# Patient Record
Sex: Female | Born: 1935 | Race: White | Hispanic: No | Smoking: Never smoker
Health system: Southern US, Community
[De-identification: ages and names within clinical notes are randomized; demographics above are authoritative.]

## PROBLEM LIST (undated history)

## (undated) DIAGNOSIS — M79672 Pain in left foot: Secondary | ICD-10-CM

## (undated) DIAGNOSIS — E7800 Pure hypercholesterolemia, unspecified: Secondary | ICD-10-CM

## (undated) DIAGNOSIS — N183 Chronic kidney disease, stage 3 (moderate): Secondary | ICD-10-CM

## (undated) DIAGNOSIS — I459 Conduction disorder, unspecified: Secondary | ICD-10-CM

## (undated) DIAGNOSIS — I82451 Acute embolism and thrombosis of right peroneal vein: Secondary | ICD-10-CM

## (undated) DIAGNOSIS — Z96651 Presence of right artificial knee joint: Secondary | ICD-10-CM

## (undated) DIAGNOSIS — I442 Atrioventricular block, complete: Secondary | ICD-10-CM

## (undated) DIAGNOSIS — Z972 Presence of dental prosthetic device (complete) (partial): Secondary | ICD-10-CM

## (undated) DIAGNOSIS — J479 Bronchiectasis, uncomplicated: Secondary | ICD-10-CM

## (undated) DIAGNOSIS — R51 Headache: Secondary | ICD-10-CM

## (undated) DIAGNOSIS — R519 Headache, unspecified: Secondary | ICD-10-CM

## (undated) DIAGNOSIS — R0609 Other forms of dyspnea: Secondary | ICD-10-CM

## (undated) DIAGNOSIS — M5416 Radiculopathy, lumbar region: Secondary | ICD-10-CM

## (undated) DIAGNOSIS — M898X5 Other specified disorders of bone, thigh: Secondary | ICD-10-CM

## (undated) DIAGNOSIS — G8929 Other chronic pain: Secondary | ICD-10-CM

## (undated) DIAGNOSIS — E559 Vitamin D deficiency, unspecified: Secondary | ICD-10-CM

## (undated) DIAGNOSIS — N200 Calculus of kidney: Secondary | ICD-10-CM

## (undated) DIAGNOSIS — K5790 Diverticulosis of intestine, part unspecified, without perforation or abscess without bleeding: Secondary | ICD-10-CM

## (undated) DIAGNOSIS — J8410 Pulmonary fibrosis, unspecified: Secondary | ICD-10-CM

## (undated) DIAGNOSIS — K219 Gastro-esophageal reflux disease without esophagitis: Secondary | ICD-10-CM

## (undated) DIAGNOSIS — M79605 Pain in left leg: Secondary | ICD-10-CM

## (undated) DIAGNOSIS — I10 Essential (primary) hypertension: Secondary | ICD-10-CM

## (undated) DIAGNOSIS — N179 Acute kidney failure, unspecified: Secondary | ICD-10-CM

## (undated) DIAGNOSIS — S32040A Wedge compression fracture of fourth lumbar vertebra, initial encounter for closed fracture: Secondary | ICD-10-CM

## (undated) DIAGNOSIS — R5381 Other malaise: Secondary | ICD-10-CM

## (undated) DIAGNOSIS — I951 Orthostatic hypotension: Secondary | ICD-10-CM

## (undated) DIAGNOSIS — I63311 Cerebral infarction due to thrombosis of right middle cerebral artery: Secondary | ICD-10-CM

## (undated) DIAGNOSIS — K589 Irritable bowel syndrome without diarrhea: Secondary | ICD-10-CM

## (undated) DIAGNOSIS — R112 Nausea with vomiting, unspecified: Secondary | ICD-10-CM

## (undated) DIAGNOSIS — I219 Acute myocardial infarction, unspecified: Secondary | ICD-10-CM

## (undated) DIAGNOSIS — S32592A Other specified fracture of left pubis, initial encounter for closed fracture: Secondary | ICD-10-CM

## (undated) DIAGNOSIS — R531 Weakness: Secondary | ICD-10-CM

## (undated) DIAGNOSIS — I69354 Hemiplegia and hemiparesis following cerebral infarction affecting left non-dominant side: Secondary | ICD-10-CM

## (undated) DIAGNOSIS — M1711 Unilateral primary osteoarthritis, right knee: Secondary | ICD-10-CM

## (undated) DIAGNOSIS — M79609 Pain in unspecified limb: Secondary | ICD-10-CM

## (undated) DIAGNOSIS — J449 Chronic obstructive pulmonary disease, unspecified: Secondary | ICD-10-CM

## (undated) DIAGNOSIS — I2510 Atherosclerotic heart disease of native coronary artery without angina pectoris: Secondary | ICD-10-CM

## (undated) DIAGNOSIS — R600 Localized edema: Secondary | ICD-10-CM

## (undated) DIAGNOSIS — R5383 Other fatigue: Secondary | ICD-10-CM

## (undated) DIAGNOSIS — R001 Bradycardia, unspecified: Secondary | ICD-10-CM

## (undated) DIAGNOSIS — I4891 Unspecified atrial fibrillation: Secondary | ICD-10-CM

## (undated) DIAGNOSIS — K635 Polyp of colon: Secondary | ICD-10-CM

## (undated) DIAGNOSIS — Z950 Presence of cardiac pacemaker: Secondary | ICD-10-CM

## (undated) DIAGNOSIS — Z9889 Other specified postprocedural states: Secondary | ICD-10-CM

## (undated) DIAGNOSIS — M810 Age-related osteoporosis without current pathological fracture: Secondary | ICD-10-CM

## (undated) DIAGNOSIS — G971 Other reaction to spinal and lumbar puncture: Secondary | ICD-10-CM

## (undated) DIAGNOSIS — R000 Tachycardia, unspecified: Secondary | ICD-10-CM

## (undated) DIAGNOSIS — R079 Chest pain, unspecified: Secondary | ICD-10-CM

## (undated) DIAGNOSIS — M2042 Other hammer toe(s) (acquired), left foot: Secondary | ICD-10-CM

## (undated) DIAGNOSIS — J189 Pneumonia, unspecified organism: Secondary | ICD-10-CM

## (undated) DIAGNOSIS — I83893 Varicose veins of bilateral lower extremities with other complications: Secondary | ICD-10-CM

## (undated) DIAGNOSIS — I471 Supraventricular tachycardia: Secondary | ICD-10-CM

## (undated) DIAGNOSIS — I82409 Acute embolism and thrombosis of unspecified deep veins of unspecified lower extremity: Secondary | ICD-10-CM

## (undated) DIAGNOSIS — R002 Palpitations: Secondary | ICD-10-CM

## (undated) DIAGNOSIS — M25562 Pain in left knee: Secondary | ICD-10-CM

## (undated) DIAGNOSIS — A310 Pulmonary mycobacterial infection: Secondary | ICD-10-CM

## (undated) DIAGNOSIS — I472 Ventricular tachycardia: Secondary | ICD-10-CM

## (undated) DIAGNOSIS — I639 Cerebral infarction, unspecified: Secondary | ICD-10-CM

## (undated) DIAGNOSIS — M1990 Unspecified osteoarthritis, unspecified site: Secondary | ICD-10-CM

## (undated) DIAGNOSIS — J431 Panlobular emphysema: Secondary | ICD-10-CM

## (undated) DIAGNOSIS — Z86711 Personal history of pulmonary embolism: Secondary | ICD-10-CM

## (undated) HISTORY — DX: Atrioventricular block, complete: I44.2

## (undated) HISTORY — PX: MULTIPLE TOOTH EXTRACTIONS: SHX2053

## (undated) HISTORY — DX: Ventricular tachycardia: I47.2

## (undated) HISTORY — DX: Unspecified atrial fibrillation: I48.91

## (undated) HISTORY — DX: Other specified fracture of left pubis, initial encounter for closed fracture: S32.592A

## (undated) HISTORY — PX: CHOLECYSTECTOMY: SHX55

## (undated) HISTORY — DX: Other specified disorders of bone, thigh: M89.8X5

## (undated) HISTORY — DX: Vitamin D deficiency, unspecified: E55.9

## (undated) HISTORY — PX: BLADDER SUSPENSION: SHX72

## (undated) HISTORY — DX: Acute embolism and thrombosis of unspecified deep veins of unspecified lower extremity: I82.409

## (undated) HISTORY — DX: Other forms of dyspnea: R06.09

## (undated) HISTORY — DX: Other hammer toe(s) (acquired), left foot: M20.42

## (undated) HISTORY — DX: Other specified postprocedural states: Z98.890

## (undated) HISTORY — DX: Wedge compression fracture of fourth lumbar vertebra, initial encounter for closed fracture: S32.040A

## (undated) HISTORY — DX: Pulmonary fibrosis, unspecified: J84.10

## (undated) HISTORY — DX: Unilateral primary osteoarthritis, right knee: M17.11

## (undated) HISTORY — DX: Radiculopathy, lumbar region: M54.16

## (undated) HISTORY — DX: Pain in left knee: M25.562

## (undated) HISTORY — DX: Personal history of pulmonary embolism: Z86.711

## (undated) HISTORY — DX: Tachycardia, unspecified: R00.0

## (undated) HISTORY — DX: Other malaise: R53.81

## (undated) HISTORY — DX: Calculus of kidney: N20.0

## (undated) HISTORY — DX: Weakness: R53.1

## (undated) HISTORY — DX: Diverticulosis of intestine, part unspecified, without perforation or abscess without bleeding: K57.90

## (undated) HISTORY — DX: Pulmonary mycobacterial infection: A31.0

## (undated) HISTORY — DX: Essential (primary) hypertension: I10

## (undated) HISTORY — PX: TOTAL ABDOMINAL HYSTERECTOMY: SHX209

## (undated) HISTORY — PX: COLON SURGERY: SHX602

## (undated) HISTORY — DX: Pain in unspecified limb: M79.609

## (undated) HISTORY — DX: Other chronic pain: G89.29

## (undated) HISTORY — DX: Pain in left foot: M79.672

## (undated) HISTORY — DX: Cerebral infarction due to thrombosis of right middle cerebral artery: I63.311

## (undated) HISTORY — DX: Other fatigue: R53.83

## (undated) HISTORY — PX: APPENDECTOMY: SHX54

## (undated) HISTORY — DX: Age-related osteoporosis without current pathological fracture: M81.0

## (undated) HISTORY — DX: Acute embolism and thrombosis of right peroneal vein: I82.451

## (undated) HISTORY — DX: Presence of cardiac pacemaker: Z95.0

## (undated) HISTORY — DX: Bradycardia, unspecified: R00.1

## (undated) HISTORY — DX: Chronic kidney disease, stage 3 (moderate): N18.3

## (undated) HISTORY — DX: Supraventricular tachycardia: I47.1

## (undated) HISTORY — DX: Polyp of colon: K63.5

## (undated) HISTORY — DX: Pure hypercholesterolemia, unspecified: E78.00

## (undated) HISTORY — DX: Atherosclerotic heart disease of native coronary artery without angina pectoris: I25.10

## (undated) HISTORY — DX: Chest pain, unspecified: R07.9

## (undated) HISTORY — DX: Conduction disorder, unspecified: I45.9

## (undated) HISTORY — DX: Bronchiectasis, uncomplicated: J47.9

## (undated) HISTORY — DX: Orthostatic hypotension: I95.1

## (undated) HISTORY — PX: COLONOSCOPY W/ BIOPSIES AND POLYPECTOMY: SHX1376

## (undated) HISTORY — DX: Panlobular emphysema: J43.1

## (undated) HISTORY — DX: Palpitations: R00.2

## (undated) HISTORY — DX: Gastro-esophageal reflux disease without esophagitis: K21.9

## (undated) HISTORY — DX: Hemiplegia and hemiparesis following cerebral infarction affecting left non-dominant side: I69.354

## (undated) HISTORY — DX: Localized edema: R60.0

## (undated) HISTORY — DX: Pain in left leg: M79.605

## (undated) HISTORY — DX: Presence of right artificial knee joint: Z96.651

## (undated) HISTORY — DX: Acute myocardial infarction, unspecified: I21.9

## (undated) HISTORY — PX: TONSILLECTOMY: SUR1361

## (undated) HISTORY — DX: Varicose veins of bilateral lower extremities with other complications: I83.893

## (undated) HISTORY — DX: Irritable bowel syndrome, unspecified: K58.9

## (undated) HISTORY — DX: Acute kidney failure, unspecified: N17.9

---

## 1973-11-09 DIAGNOSIS — I219 Acute myocardial infarction, unspecified: Secondary | ICD-10-CM | POA: Insufficient documentation

## 1973-11-09 HISTORY — DX: Acute myocardial infarction, unspecified: I21.9

## 2004-04-04 ENCOUNTER — Inpatient Hospital Stay (HOSPITAL_COMMUNITY): Admission: RE | Admit: 2004-04-04 | Discharge: 2004-04-10 | Payer: Self-pay | Admitting: General Surgery

## 2004-04-04 ENCOUNTER — Encounter (INDEPENDENT_AMBULATORY_CARE_PROVIDER_SITE_OTHER): Payer: Self-pay | Admitting: Specialist

## 2004-04-30 ENCOUNTER — Encounter: Admission: RE | Admit: 2004-04-30 | Discharge: 2004-04-30 | Payer: Self-pay | Admitting: General Surgery

## 2005-04-02 ENCOUNTER — Ambulatory Visit: Payer: Self-pay | Admitting: Family Medicine

## 2005-05-05 ENCOUNTER — Ambulatory Visit: Payer: Self-pay | Admitting: Family Medicine

## 2005-06-25 ENCOUNTER — Ambulatory Visit: Payer: Self-pay | Admitting: Family Medicine

## 2005-07-08 ENCOUNTER — Ambulatory Visit: Payer: Self-pay | Admitting: Family Medicine

## 2005-07-21 ENCOUNTER — Ambulatory Visit: Payer: Self-pay | Admitting: Family Medicine

## 2005-08-06 ENCOUNTER — Ambulatory Visit: Payer: Self-pay | Admitting: Family Medicine

## 2005-09-04 ENCOUNTER — Ambulatory Visit: Payer: Self-pay | Admitting: Family Medicine

## 2011-03-27 NOTE — Op Note (Signed)
NAME:  LENOIR, FACCHINI                          ACCOUNT NO.:  [id number]   MEDICAL RECORD NO.:  [id number]                   PATIENT TYPE:  INP   LOCATION:  NA                                   FACILITY:  [facility]   PHYSICIAN:  Timothy E. Earlene Plater, M.D.              DATE OF BIRTH:  [birth year]   DATE OF PROCEDURE:  04/04/2004  DATE OF DISCHARGE:                                 OPERATIVE REPORT   PREOPERATIVE DIAGNOSIS:  Chronic recurrent diverticulitis.   POSTOPERATIVE DIAGNOSIS:  Chronic recurrent diverticulitis.   PROCEDURE:  Sigmoid colectomy.  Take-down of splenic flexure.  Colorectal  anastomosis.   SURGEON:  Timothy E. Earlene Plater, M.D.   ASSISTANT:  Sharlet Salina T. Hoxworth, M.D.   ANESTHESIA:  CRNA, supervised MD.   Ms. Remley is 39 and in good health for her age.  Has had recurrent bouts of  diverticulitis over the past five years, each one worsening in intensity.  After a thorough cardiac and gastrointestinal evaluation, she elected to  proceed with a colon resection at this time.  Her colonoscopy was otherwise  unremarkable except for the chronic diverticulosis.  The CT scan showed  minimal inflammatory changes at this time.  Cardiac workup was negative.   She is identified, and the permit signed.  IV started.  IV fluids  resuscitation began.  IV antibiotics given.  She was taken to the operating  room and placed supine.  General endotracheal anesthesia is administered.  PAS hose, Foley catheter, and a nasogastric tube placed.  The abdomen was  prepped and draped in the usual fashion.  A long midline incision was used  to enter the abdominal cavity just above the umbilicus to the lower midline.  The peritoneum was entered.  There were minimal adhesions to the anterior  abdominal wall.  These were taken down bluntly and sharply in the free  abdominal cavity was carefully explored.  The gallbladder was absent.  Ovaries and uterus were absent.  Most remarkable was the sigmoid colon,  which was redundant, doubling back on itself and distally adherent to the  left pelvic side wall.  After the exploration was complete, the small bowel  was packed into the right upper quadrant.  Appropriate retractors applied.  The white line of Toldt was carefully dissected from the splenic flexure  downward along the descending colon.  The sigmoid colon was approached  carefully.  The actual colon was quite thickened and firm and could be  sharply and bluntly dissected from the lateral pelvic wall and peritoneum.  A left ureter was carefully identified.  It was kept lateral to the  dissection.  This was accomplished until the entire sigmoid was freed from  the pelvic side wall.  We were then able to visualize the upper rectum, and  it appeared normal.  We elected to continue dissection of the splenic  flexure in order to allow some freedom with the  descending colon, so we  continued to divide the peritoneal reflection up and around the splenic  flexure until it was free to come down into the left abdomen.  This was  accomplished without difficulty, although there was a small tear in the  lower pole of the spleen on its peritoneal surface, and we packed that with  Surgicel.  The site was chosen for transection of the sigmoid at the  junction of the sigmoid and descending colon and at the junction of the  sigmoid and the upper rectum.  The mesentery was divided carefully  between  these two areas.  Each was tied with a 2-0 silk, and the transections were  made with noncrushing bowel clamps, and the specimen was passed off the  field and submitted to pathology as chronic diverticulitis and sigmoid  colon.  The distal descending colon easily reached the upper rectum, and  there was no tension.  The redundant descending colon lay across the pelvic  brim.  The anastomosis was carried out in an end-to-end, hand-sewn fashion  with 2-0 silks.  This was accomplished open.  The back wall was sewn  first,  the side walls, and then the anterior surface was completed.  Then all  instruments and gloves were changed.  A gentle wash of Betadine was placed  over the anastomosis and then carefully irrigated and then washed away.  Anastomosis was checked and seemed to be intact.  All areas were checked for  bleeding.  The pelvis was oozing but not bleeding.  Irrigation was  accomplished until clear.  Again, the spleen was checked, and the bleeding  had stopped.  The Surgicel was left in place.  All counts were correct.  The  small bowel was allowed to lay in its normal mid abdominal position.  The  omentum was drawn down over the colon, the small bowel, and over the  anastomosis.  The counts were correct.  The abdomen was then closed in a  single layer with a #1 PDS suture.  The subcu was irrigated, and the skin  was closed with wide skin staples.  Final counts correct.  The patient was  stable, awakened, extubated, and taken to the recovery room.                                               Timothy E. Earlene Plater, M.D.    TED/MEDQ  D:  04/04/2004  T:  04/05/2004  Job:  [id number]

## 2011-03-27 NOTE — Discharge Summary (Signed)
NAME:  [name] [name], [name] [name]                          ACCOUNT NO.:  [id number]   MEDICAL RECORD NO.:  [id number]                   PATIENT TYPE:  INP   LOCATION:  0382                                 FACILITY:  [facility]   PHYSICIAN:  Timothy E. Earlene Plater, M.D.              DATE OF BIRTH:  Feb 19, 1924   DATE OF ADMISSION:  04/04/2004  DATE OF DISCHARGE:                                 DISCHARGE SUMMARY   FINAL DIAGNOSES:  1. Complicated diverticulosis.  2. Diverticulitis.   OPERATIVE PROCEDURE:  04/04/2004  Sigmoid colectomy with primary  anastomosis.   See the enclosed notes.  The patient had been seen in follow up by her home  physician.  She has had several severe attacks of diverticulitis.  She has  been on and off of antibiotics and dietary measures and finally has come to  surgery after careful discussion.  She is prepared and ready for the  surgery.   Laboratory Values:  Her chest x-ray and cardiogram were normal.   She was seen and examined prior to surgery and taken to the operating room  on the day of admission.  She had an extended exploratory laparotomy with  sigmoid colectomy, takedown of splenic flexure and primary anastomosis.  Her  postoperative recovery was extremely smooth and uneventful. The patient was  appropriate and alert throughout; was up and ambulatory on the first  postoperative day; had return of bowel function by the third and fourth day  and dietary was started.  She tolerated a regular diet, remained afebrile,  was able to void after removal of Foley and was fully ambulatory.   Final path report revealed diverticulitis/diverticulosis.  No evidence of  malignancy.   The patient was carefully counseled in regards to activity, wound care,  diet, and pain management.  She was ready for discharge on 06/02 and will be  followed as an outpatient.                                               Timothy E. Earlene Plater, M.D.    TED/MEDQ  D:  04/10/2004  T:  04/11/2004  Job:   [id number]   cc:   [name]  [address]  [place]  [place] [address]  Fax: [phone]   Meisenheimer, M.D.

## 2011-11-10 DIAGNOSIS — A310 Pulmonary mycobacterial infection: Secondary | ICD-10-CM

## 2011-11-10 HISTORY — DX: Pulmonary mycobacterial infection: A31.0

## 2012-01-06 ENCOUNTER — Ambulatory Visit (INDEPENDENT_AMBULATORY_CARE_PROVIDER_SITE_OTHER): Payer: Medicare PPO | Admitting: Critical Care Medicine

## 2012-01-06 ENCOUNTER — Encounter: Payer: Self-pay | Admitting: Critical Care Medicine

## 2012-01-06 DIAGNOSIS — I82409 Acute embolism and thrombosis of unspecified deep veins of unspecified lower extremity: Secondary | ICD-10-CM | POA: Insufficient documentation

## 2012-01-06 DIAGNOSIS — E7800 Pure hypercholesterolemia, unspecified: Secondary | ICD-10-CM

## 2012-01-06 DIAGNOSIS — A310 Pulmonary mycobacterial infection: Secondary | ICD-10-CM | POA: Insufficient documentation

## 2012-01-06 DIAGNOSIS — I10 Essential (primary) hypertension: Secondary | ICD-10-CM

## 2012-01-06 DIAGNOSIS — I2510 Atherosclerotic heart disease of native coronary artery without angina pectoris: Secondary | ICD-10-CM | POA: Insufficient documentation

## 2012-01-06 DIAGNOSIS — J479 Bronchiectasis, uncomplicated: Secondary | ICD-10-CM

## 2012-01-06 DIAGNOSIS — J8410 Pulmonary fibrosis, unspecified: Secondary | ICD-10-CM

## 2012-01-06 DIAGNOSIS — K589 Irritable bowel syndrome without diarrhea: Secondary | ICD-10-CM | POA: Insufficient documentation

## 2012-01-06 DIAGNOSIS — I219 Acute myocardial infarction, unspecified: Secondary | ICD-10-CM

## 2012-01-06 DIAGNOSIS — K5790 Diverticulosis of intestine, part unspecified, without perforation or abscess without bleeding: Secondary | ICD-10-CM | POA: Insufficient documentation

## 2012-01-06 DIAGNOSIS — K219 Gastro-esophageal reflux disease without esophagitis: Secondary | ICD-10-CM | POA: Insufficient documentation

## 2012-01-06 DIAGNOSIS — K5730 Diverticulosis of large intestine without perforation or abscess without bleeding: Secondary | ICD-10-CM

## 2012-01-06 NOTE — Progress Notes (Signed)
Subjective:    Patient ID: [name] [name], female    DOB: 10-31-1924, 78 y.o.   MRN: [id number]  HPI 78 y.o. WF  Referral for MAC  Pos AFB Pt dx 12/06/10 [facility] for PNA and flu like illness and bronchitis.  Sputum pos MAC. Ct showed bronchiectasis RML and L lingula and tree in bud in RLL  AFB pos   MAC   Pt adm 1/25  - 1/28  On 5d of ZMax  Since d/c feels better, is weak. Not coughing now.  No real chest pain.  No real wheeze.   No f/c/s.  LIves at home with husband and is independent. No oxygen.  Does ADLs and goes to gym every morning.  No mucus now.  Has episode toxic fume exposure, sealing bags to put hosiery into and lungs collapsed and scarred R lung.  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     Past Medical History  Diagnosis Date  . IBS (irritable bowel syndrome)   . Diverticulosis   . Colon polyps   . High cholesterol   . MI (myocardial infarction) 1975  . GERD (gastroesophageal reflux disease)   . Hypertension   . Renal stone   . Bronchiectasis, non-tuberculous   . DVT (deep venous thrombosis)     Resolved, no anticoagulation currently  . MAC (mycobacterium avium-intracellulare complex) 11/2011     Family History  Problem Relation Age of Onset  . Stomach cancer Brother   . Heart disease Father   . Heart attack Mother      History   Social History  . Marital Status: Married    Spouse Name: N/A    Number of Children: 3  . Years of Education: N/A   Occupational History  . Retired     [employer]   Social History Main Topics  . Smoking status: Never Smoker   . Smokeless tobacco: Never Used  . Alcohol Use: No  . Drug Use: No  . Sexually Active: Not on file   Other Topics Concern  . Not on file   Social History Narrative  . No narrative on file     Allergies  Allergen Reactions  . Darvon     Drop in  BP     No outpatient prescriptions prior to visit.     Review of Systems  Constitutional: Positive for fatigue. Negative for fever, chills,  diaphoresis, activity change, appetite change and unexpected weight change.  HENT: Negative for hearing loss, ear pain, nosebleeds, congestion, sore throat, facial swelling, rhinorrhea, sneezing, mouth sores, trouble swallowing, neck pain, neck stiffness, dental problem, voice change, postnasal drip, sinus pressure, tinnitus and ear discharge.   Eyes: Negative for photophobia, discharge, itching and visual disturbance.  Respiratory: Positive for cough, chest tightness and shortness of breath. Negative for apnea, choking, wheezing and stridor.   Cardiovascular: Negative for chest pain, palpitations and leg swelling.  Gastrointestinal: Negative for nausea, vomiting, abdominal pain, constipation, blood in stool and abdominal distention.  Genitourinary: Negative for dysuria, urgency, frequency, hematuria, flank pain, decreased urine volume and difficulty urinating.  Musculoskeletal: Positive for joint swelling. Negative for myalgias, back pain, arthralgias and gait problem.  Skin: Negative for color change, pallor and rash.  Neurological: Positive for weakness. Negative for dizziness, tremors, seizures, syncope, speech difficulty, light-headedness, numbness and headaches.  Hematological: Negative for adenopathy. Does not bruise/bleed easily.  Psychiatric/Behavioral: Negative for confusion, sleep disturbance and agitation. The patient is not nervous/anxious.        Objective:  Physical Exam Filed Vitals:   01/06/12 1012  BP: 150/90  Pulse: 77  Temp: 97.4 F (36.3 C)  TempSrc: Oral  Height: 5' 6.5" (1.689 m)  Weight: 158 lb 6.4 oz (71.85 kg)  SpO2: 96%    Gen: Pleasant, well-nourished, in no distress,  normal affect  ENT: No lesions,  mouth clear,  oropharynx clear, no postnasal drip  Neck: No JVD, no TMG, no carotid bruits  Lungs: No use of accessory muscles, no dullness to percussion, clear without rales or rhonchi  Cardiovascular: RRR, heart sounds normal, no murmur or gallops, no  peripheral edema  Abdomen: soft and NT, no HSM,  BS normal  Musculoskeletal: No deformities, no cyanosis or clubbing  Neuro: alert, non focal  Skin: Warm, no lesions or rashes  CT Chest 11/2011 Bilateral bronchiectasis in RML and L lingula,  Tree in bud nodularity on R lung, LLL ATX  Sputum AFB 12/07/11 Positive Mycobacterium avium intracellulare        Assessment & Plan:   Bronchiectasis, non-tuberculous Bronchiectasis RML and L lingula with associated LLL and RLL tree in bud inflammation on CT 11/2011. MAC cultured on sputum AFB culture 11/2011 Pt now asymptomatic. I suspect had chronic scar and bronchiectasis on R and L lung d/t prior fume exposure in 1980s with chemical pneumonitis. THen MAC colonized airway. Always difficult to decide whether to Rx this or not . In this case I prefer not to expose this 78yo functional F to three drug Rx with its attendant toxicity when she is now asymptomatic  Plan Follow clinically No indication for therapy at this time for mac Pt to call if symptoms change Rov 4 months  MAC (mycobacterium avium-intracellulare complex) See bronchiectasis assessment     Updated Medication List Outpatient Encounter Prescriptions as of 01/06/2012  Medication Sig Dispense Refill  . aspirin 81 MG tablet Take 81 mg by mouth daily.      Marland Kitchen atorvastatin (LIPITOR) 20 MG tablet Take 1 tablet by mouth Daily.

## 2012-01-06 NOTE — Patient Instructions (Signed)
No medication changes  Return 4 months for recheck

## 2012-01-07 ENCOUNTER — Encounter: Payer: Self-pay | Admitting: Critical Care Medicine

## 2012-01-07 NOTE — Assessment & Plan Note (Signed)
Bronchiectasis RML and L lingula with associated LLL and RLL tree in bud inflammation on CT 11/2011. MAC cultured on sputum AFB culture 11/2011 Pt now asymptomatic. I suspect had chronic scar and bronchiectasis on R and L lung d/t prior fume exposure in 1980s with chemical pneumonitis. THen MAC colonized airway. Always difficult to decide whether to Rx this or not . In this case I prefer not to expose this 78yo functional F to three drug Rx with its attendant toxicity when she is now asymptomatic  Plan Follow clinically No indication for therapy at this time for mac Pt to call if symptoms change Rov 4 months

## 2012-01-07 NOTE — Assessment & Plan Note (Signed)
See  bronchiectasis assessment 

## 2012-01-20 ENCOUNTER — Inpatient Hospital Stay (HOSPITAL_COMMUNITY)
Admission: EM | Admit: 2012-01-20 | Discharge: 2012-01-23 | DRG: 064 | Disposition: A | Discharge status: Home Health | Payer: Medicare HMO | Attending: Neurology | Admitting: Neurology

## 2012-01-20 ENCOUNTER — Other Ambulatory Visit: Payer: Self-pay

## 2012-01-20 ENCOUNTER — Emergency Department (HOSPITAL_COMMUNITY): Payer: Medicare HMO

## 2012-01-20 DIAGNOSIS — K219 Gastro-esophageal reflux disease without esophagitis: Secondary | ICD-10-CM | POA: Diagnosis present

## 2012-01-20 DIAGNOSIS — E785 Hyperlipidemia, unspecified: Secondary | ICD-10-CM | POA: Diagnosis present

## 2012-01-20 DIAGNOSIS — I619 Nontraumatic intracerebral hemorrhage, unspecified: Secondary | ICD-10-CM

## 2012-01-20 DIAGNOSIS — E8589 Other amyloidosis: Secondary | ICD-10-CM | POA: Diagnosis present

## 2012-01-20 DIAGNOSIS — I10 Essential (primary) hypertension: Secondary | ICD-10-CM | POA: Diagnosis present

## 2012-01-20 DIAGNOSIS — K589 Irritable bowel syndrome without diarrhea: Secondary | ICD-10-CM | POA: Diagnosis present

## 2012-01-20 DIAGNOSIS — G936 Cerebral edema: Secondary | ICD-10-CM | POA: Diagnosis present

## 2012-01-20 DIAGNOSIS — Z86718 Personal history of other venous thrombosis and embolism: Secondary | ICD-10-CM

## 2012-01-20 DIAGNOSIS — I679 Cerebrovascular disease, unspecified: Secondary | ICD-10-CM | POA: Diagnosis present

## 2012-01-20 DIAGNOSIS — I252 Old myocardial infarction: Secondary | ICD-10-CM

## 2012-01-20 LAB — DIFFERENTIAL
Basophils Relative: 1 % (ref 0–1)
Neutro Abs: 3.5 10*3/uL (ref 1.7–7.7)
Neutrophils Relative %: 63 % (ref 43–77)

## 2012-01-20 LAB — BASIC METABOLIC PANEL
BUN: 12 mg/dL (ref 6–23)
CO2: 27 mEq/L (ref 19–32)
GFR calc Af Amer: 68 mL/min — ABNORMAL LOW (ref >90)
GFR calc non Af Amer: 58 mL/min — ABNORMAL LOW (ref >90)
Glucose, Bld: 96 mg/dL (ref 70–99)

## 2012-01-20 LAB — CBC
MCH: 31.1 pg (ref 26.0–34.0)
MCHC: 33.8 g/dL (ref 30.0–36.0)
Platelets: 149 10*3/uL — ABNORMAL LOW (ref 150–400)

## 2012-01-20 LAB — POCT I-STAT TROPONIN I: Troponin i, poc: 0.01 ng/mL (ref 0.00–0.08)

## 2012-01-20 MED ORDER — SIMVASTATIN 40 MG PO TABS
40.0000 mg | ORAL_TABLET | Freq: Every day | ORAL | Status: DC
  Administered 2012-01-21: 40 mg via ORAL
  Filled 2012-01-20 (×2): qty 1

## 2012-01-20 MED ORDER — GI COCKTAIL ~~LOC~~
30.0000 mL | Freq: Once | ORAL | Status: AC
  Administered 2012-01-20: 30 mL via ORAL
  Filled 2012-01-20: qty 30

## 2012-01-20 MED ORDER — HYDROCODONE-ACETAMINOPHEN 5-325 MG PO TABS
1.0000 | ORAL_TABLET | ORAL | Status: DC | PRN
  Administered 2012-01-20 – 2012-01-22 (×5): 1 via ORAL
  Filled 2012-01-20 (×5): qty 1

## 2012-01-20 MED ORDER — SIMVASTATIN 40 MG PO TABS
40.0000 mg | ORAL_TABLET | Freq: Once | ORAL | Status: AC
  Administered 2012-01-20: 40 mg via ORAL
  Filled 2012-01-20: qty 1

## 2012-01-20 MED ORDER — HYDRALAZINE HCL 20 MG/ML IJ SOLN
10.0000 mg | INTRAMUSCULAR | Status: DC | PRN
  Filled 2012-01-20: qty 0.5

## 2012-01-20 MED ORDER — MORPHINE SULFATE 4 MG/ML IJ SOLN
4.0000 mg | Freq: Once | INTRAMUSCULAR | Status: AC
  Administered 2012-01-20: 4 mg via INTRAVENOUS
  Filled 2012-01-20: qty 1

## 2012-01-20 MED ORDER — AMLODIPINE BESYLATE 5 MG PO TABS
5.0000 mg | ORAL_TABLET | Freq: Every day | ORAL | Status: DC
  Administered 2012-01-21 – 2012-01-23 (×3): 5 mg via ORAL
  Filled 2012-01-20 (×4): qty 1

## 2012-01-20 MED ORDER — FENTANYL CITRATE 0.05 MG/ML IJ SOLN
25.0000 ug | Freq: Once | INTRAMUSCULAR | Status: AC
  Administered 2012-01-20: 25 ug via INTRAVENOUS
  Filled 2012-01-20: qty 2

## 2012-01-20 MED ORDER — MORPHINE SULFATE 2 MG/ML IJ SOLN
2.0000 mg | Freq: Once | INTRAMUSCULAR | Status: AC
  Administered 2012-01-20: 2 mg via INTRAVENOUS
  Filled 2012-01-20: qty 1

## 2012-01-20 MED ORDER — AMLODIPINE BESYLATE 5 MG PO TABS
5.0000 mg | ORAL_TABLET | Freq: Once | ORAL | Status: AC
  Administered 2012-01-20: 5 mg via ORAL
  Filled 2012-01-20: qty 1

## 2012-01-20 MED ORDER — ONDANSETRON HCL 4 MG/2ML IJ SOLN
4.0000 mg | Freq: Once | INTRAMUSCULAR | Status: AC
  Administered 2012-01-20: 4 mg via INTRAVENOUS
  Filled 2012-01-20: qty 2

## 2012-01-20 NOTE — ED Notes (Signed)
[id number] Ready 

## 2012-01-20 NOTE — ED Notes (Signed)
Patient states she is having abdominal pain. Dr. Blossom Hoops has been paged.

## 2012-01-20 NOTE — ED Notes (Signed)
Patient remains on monitor and placed on 2L oxygen with sats of 98%. Patient was placed on oxygen after sats dropped to 88% on RA. Patient resting waiting admission to room upstairs.

## 2012-01-20 NOTE — ED Notes (Signed)
Patient transported to CT 

## 2012-01-20 NOTE — ED Provider Notes (Signed)
See prior note   Ward Givens, MD 01/20/12 [phone]

## 2012-01-20 NOTE — Consult Note (Signed)
Chief Complaint: "cannot see from the right side of her vision"  HPI: [name] [name] is an 78 y.o. female who had a sudden onset inability to see from the right side of her vision on Sunday morning while in church. She went to see her physician later and had imaging of her brain that showed a left occipital hemorrhagic stroke.  LSN: Sunday morning  tPA Given: No: out of the window  mRankin: 0  Past Medical History   Diagnosis  Date   .  IBS (irritable bowel syndrome)    .  Diverticulosis    .  Colon polyps    .  High cholesterol    .  MI (myocardial infarction)  1975   .  GERD (gastroesophageal reflux disease)    .  Hypertension    .  Renal stone    .  Bronchiectasis, non-tuberculous    .  DVT (deep venous thrombosis)      Resolved, no anticoagulation currently   .  MAC (mycobacterium avium-intracellulare complex)  11/2011    Past Surgical History   Procedure  Date   .  Total abdominal hysterectomy    .  Appendectomy    .  Bladder tack    .  Cholecystectomy    .  Colon surgery      12inches colectomy s/p diverticulitis   .  Tonsillectomy     Family History   Problem  Relation  Age of Onset   .  Stomach cancer  Brother    .  Heart disease  Father    .  Heart attack  Mother    Social History: reports that she has never smoked. She has never used smokeless tobacco. She reports that she does not drink alcohol or use illicit drugs.  Allergies:  Allergies   Allergen  Reactions   .  Darvon      Drop in BP   Medications: I have reviewed the patient's current medications.  ROS:  as above  Physical Examination:  Blood pressure 173/70, pulse 65, temperature 98.5 F (36.9 C), temperature source Oral, resp. rate 17, SpO2 97.00%.  Neurologic Examination:  Neurological exam: AAO*3. No aphasia. Followed complex commands. Cranial nerves: EOMI, PERRL. Right field cut. Sensation to V1 through V3 areas of the face was intact and symmetric throughout. There was no facial asymmetry. Hearing  to finger rub was equal and symmetrical bilaterally. Shoulder shrug was 5/5 and symmetric bilaterally. Head rotation was 5/5 bilaterally. There was no dysarthria or palatal deviation.Motor: strength was 5/5 and symmetric throughout. Sensory: was intact throughout to light touch, pinprick. Coordination: finger-to-nose and heel-to-shin were intact and symmetric bilaterally. Reflexes: were 2+ in upper extremities and 1+ at the knees and 1+ at the ankles. Plantar response was downgoing bilaterally. Gait: deferred  Results for orders placed during the hospital encounter of 01/20/12 (from the past 48 hour(s))   CBC Status: Abnormal    Collection Time    01/20/12 1:03 PM   Component  Value  Range  Comment    WBC  5.7  4.0 - 10.5 (K/uL)     RBC  4.41  3.87 - 5.11 (MIL/uL)     Hemoglobin  13.7  12.0 - 15.0 (g/dL)     HCT  40.5  36.0 - 46.0 (%)     MCV  91.8  78.0 - 100.0 (fL)     MCH  31.1  26.0 - 34.0 (pg)     MCHC  33.8    30.0 - 36.0 (g/dL)     RDW  13.4  11.5 - 15.5 (%)     Platelets  149 (*)  150 - 400 (K/uL)    DIFFERENTIAL Status: Normal    Collection Time    01/20/12 1:03 PM   Component  Value  Range  Comment    Neutrophils Relative  63  43 - 77 (%)     Neutro Abs  3.5  1.7 - 7.7 (K/uL)     Lymphocytes Relative  27  12 - 46 (%)     Lymphs Abs  1.5  0.7 - 4.0 (K/uL)     Monocytes Relative  8  3 - 12 (%)     Monocytes Absolute  0.5  0.1 - 1.0 (K/uL)     Eosinophils Relative  2  0 - 5 (%)     Eosinophils Absolute  0.1  0.0 - 0.7 (K/uL)     Basophils Relative  1  0 - 1 (%)     Basophils Absolute  0.0  0.0 - 0.1 (K/uL)    BASIC METABOLIC PANEL Status: Abnormal    Collection Time    01/20/12 1:03 PM   Component  Value  Range  Comment    Sodium  144  135 - 145 (mEq/L)     Potassium  3.9  3.5 - 5.1 (mEq/L)     Chloride  107  96 - 112 (mEq/L)     CO2  27  19 - 32 (mEq/L)     Glucose, Bld  96  70 - 99 (mg/dL)     BUN  12  6 - 23 (mg/dL)     Creatinine, Ser  0.88  0.50 - 1.10 (mg/dL)      Calcium  9.5  8.4 - 10.5 (mg/dL)     GFR calc non Af Amer  58 (*)  >90 (mL/min)     GFR calc Af Amer  68 (*)  >90 (mL/min)    POCT I-STAT TROPONIN I Status: Normal    Collection Time    01/20/12 1:22 PM   Component  Value  Range  Comment    Troponin i, poc  0.01  0.00 - 0.08 (ng/mL)     Comment 3      Ct Head Wo Contrast  01/20/2012 *RADIOLOGY REPORT* Clinical Data: Headache and right visual field cut. The patient was told yesterday at [facility] she had a small hemorrhage. CT HEAD WITHOUT CONTRAST Technique: Contiguous axial images were obtained from the base of the skull through the vertex without contrast. Comparison: Brain MRI 01/19/2012 and head CT 04/09/2011 Findings: Acute hemorrhage in the left occipital lobe with mild surrounding edema appears similar in size compared to the brain MRI performed yesterday, 01/19/2012, given differences in technique. The hemorrhage measures 25 by 20 mm in axial diameter. No additional areas of hemorrhage are identified. The ventricles are within normal limits for size. 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 Mild age related cerebral volume loss is stable. There is no midline shift. Foramen magnum is patent. No evidence of herniation. Minimal thickening of some of the ethmoid air cells is noted. No air-fluid levels are seen in the sinuses. The skull is intact. Visualized soft tissues of the scalp and orbits show no acute findings. IMPRESSION: 1. Left occipital acute hemorrhage with mild surrounding edema. No significant interval change compared to the brain MRI performed

## 2012-01-20 NOTE — ED Notes (Addendum)
Patient states she was in church Sunday morning and had a sharp pain in her left temperal area and her vision was blurred. On Monday pain radiated to the right temperal area radiating to the back of her head. patient states she does not have vision in the right eye. Patient denies N/V/F. Patient states she was her doctors office and had a CT and was told she has a stroke and blood on the brain and patient was discharged home to follow up with her doctor. Patient states she called her doctor and he advised her to come to [facility]. Patyient placed on monitor and stas of 97% on RA. Family at bedside. EDP at bedside.

## 2012-01-20 NOTE — ED Provider Notes (Addendum)
PT relates acute onset of a sharp left-sided headache 3 days ago and she noted that she was having visual field loss in her right eye. She states her left eye seems fine. She states the headache initially was sharp and she felt weak all over. She relates the following day the headache was dull and achy in character. She was seen by her ophthalmologist yesterday and had a CT scan of her head done which showed a bleeding stroke. She was advised to stop all her medicines and that "the worst is over". She relates this morning she started having headache returned again. She states she still has the visual field loss. She denies any numbness or weakness in her arms or legs or any difficulty walking or using her arms.  Patient meds aspirin 81 mg every other day and aorvastin  PCP Dr. Sol Passer in [place]  Patient is alert and cooperative. When I do visual field she has a marked peripheral field cut laterally in her right thigh. Her visual fields are intact in her left eye. She has no pronator drift or weakness in her legs. Her grips are equal bilaterally. She has no cranial nerve deficit.  14:44 Dr Mayford Knife, radiology called CT results, states the lesion is unchanged from MRI yesterday.   Diagnoses that have been ruled out:  None  Diagnoses that are still under consideration:  None  Final diagnoses:  Cerebral hemorrhage   Plan admission  Medical screening examination/treatment/procedure(s) were conducted as a shared visit with non-physician practitioner(s) and myself.  I personally evaluated the patient during the encounter Devoria Albe, MD, Franz Dell, MD 01/20/12 1311  Ward Givens, MD 01/20/12 1530  Ward Givens, MD 01/20/12 [phone]

## 2012-01-20 NOTE — ED Provider Notes (Signed)
History     CSN: [id number]  Arrival date & time 01/20/12  1138   First MD Initiated Contact with Patient 01/20/12 1155      Chief Complaint  Patient presents with  . Headache    (Consider location/radiation/quality/duration/timing/severity/associated sxs/prior treatment) HPI History is obtained from the patient and family. 78 year old female with past medical history of MI, hypertension presents with headache and visual field cut. She states that she first noticed a left-sided sharp temporal headache on Sunday morning. She was attempting to reach scripture while at church and noticed that she was unable to do so. States that she noticed a "cut" in her vision. When looking at a person, she states that she only sees the left side of them. Denies any numbness or weakness or tingling in her arms or legs. She is able to ambulate. Denies any difficulty word finding, and per husband at bedside, she has been acting normally. She went to her ophthalmologist yesterday regarding the vision and was told to go to her family doctor for further evaluation. Family doctor apparently ordered a CT of the head yesterday which showed a "wet stroke". She is scheduled for a followup MRI tomorrow along with Dopplers of the carotids. She states that she called her family doctor this morning as her headache was worsening, and was told to come to [place] for further evaluation.  Past Medical History  Diagnosis Date  . IBS (irritable bowel syndrome)   . Diverticulosis   . Colon polyps   . High cholesterol   . MI (myocardial infarction) 1975  . GERD (gastroesophageal reflux disease)   . Hypertension   . Renal stone   . Bronchiectasis, non-tuberculous   . DVT (deep venous thrombosis)     Resolved, no anticoagulation currently  . 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 Total abdominal hysterectomy   . Appendectomy   . Bladder tack   .  Cholecystectomy   . Colon surgery     12 inches colectomy s/p diverticulitis  . Tonsillectomy     Family History  Problem Relation Age of Onset  . Stomach cancer Brother   . Heart disease Father   . Heart attack Mother     History  Substance Use Topics  . Smoking status: Never Smoker   . Smokeless tobacco: Never Used  . Alcohol Use: No    OB History    Grav Para Term Preterm Abortions TAB SAB Ect Mult Living                  Review of Systems  Constitutional: Negative for fever, chills, activity change and appetite change.  HENT: Negative for congestion, rhinorrhea and trouble swallowing.   Eyes: Positive for visual disturbance. Negative for photophobia, pain and discharge.  Respiratory: Negative for chest tightness and shortness of breath.   Cardiovascular: Negative for chest pain, palpitations and leg swelling.  Gastrointestinal: Negative for nausea, vomiting and abdominal pain.  Musculoskeletal: Negative for myalgias.  Skin: Negative for color change and rash.  Neurological: Positive for weakness and headaches. Negative for dizziness and numbness.    Allergies  Darvon  Home Medications   Current Outpatient Rx  Name Route Sig Dispense Refill  . ASPIRIN 81 MG PO TABS Oral Take 81 mg by mouth daily.    . ATORVASTATIN CALCIUM 20 MG PO TABS Oral Take 1 tablet by mouth Daily.      BP 181/81  Pulse 68  Temp(Src) 98.5 F (36.9 C) (Oral)  Resp 14  SpO2 98%  Physical Exam  Nursing note and vitals reviewed. Constitutional: She is oriented to person, place, and time. She appears well-developed and well-nourished. No distress.  HENT:  Head: Normocephalic and atraumatic.  Right Ear: External ear normal.  Left Ear: External ear normal.  Mouth/Throat: Oropharynx is clear and moist.  Eyes: Conjunctivae and EOM are normal. Pupils are equal, round, and reactive to light.  Neck: Normal range of motion. Neck supple.  Cardiovascular: Normal rate, regular rhythm and  normal heart sounds.        No carotid bruits appreciated  Pulmonary/Chest: Effort normal and breath sounds normal.  Abdominal: Soft. Bowel sounds are normal. There is no tenderness. There is no rebound and no guarding.  Musculoskeletal: Normal range of motion.  Neurological: She is alert and oriented to person, place, and time. She displays normal reflexes. A cranial nerve deficit is present. No sensory deficit. She exhibits normal muscle tone. Coordination normal.       Lateral visual field deficit R eye. Speech clear without slurring, A+O, grip strength equal b/l, sensory intact to lt touch in all 4 ext  Skin: Skin is warm and dry. No rash noted. She is not diaphoretic.  Psychiatric: She has a normal mood and affect.    ED Course  Procedures (including critical care time)   Date: 01/20/2012  Rate: 66  Rhythm: normal sinus rhythm  QRS Axis: normal  Intervals: PR prolonged  ST/T Wave abnormalities: normal  Conduction Disutrbances:first-degree A-V block   Narrative Interpretation: PR interval  Old EKG Reviewed: none available  Labs Reviewed  CBC - Abnormal; Notable for the following:    Platelets 149 (*)    All other components within normal limits  BASIC METABOLIC PANEL - Abnormal; Notable for the following:    GFR calc non Af Amer 58 (*)    GFR calc Af Amer 68 (*)    All other components within normal limits  DIFFERENTIAL  POCT I-STAT TROPONIN I   Ct Head Wo Contrast  01/20/2012  *RADIOLOGY REPORT*  Clinical Data: Headache and right visual field cut.  The patient was told yesterday at [facility] she had a small hemorrhage.  CT HEAD WITHOUT CONTRAST  Technique:  Contiguous axial images were obtained from the base of the skull through the vertex without contrast.  Comparison: Brain MRI 01/19/2012 and head CT 04/09/2011  Findings: Acute hemorrhage in the left occipital lobe with mild surrounding edema appears similar in size compared to the brain MRI performed  yesterday, 01/19/2012, given differences in technique. The hemorrhage measures 25 by 20 mm in axial diameter.  No additional areas of hemorrhage are identified.  The ventricles are within normal limits for size.  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  Mild age related cerebral volume loss is stable.  There is no midline shift. Foramen magnum is patent.  No evidence of herniation.  Minimal thickening of some of the ethmoid air cells is noted.  No air-fluid levels are seen in the sinuses.  The skull is intact. Visualized soft tissues of the scalp and orbits show no acute findings.  IMPRESSION:  1.  Left occipital acute hemorrhage with mild surrounding edema. No significant interval change compared to the brain MRI performed 01/19/2012. Findings discussed by telephone with Dr. Lynelle Doctor at 2:23 pm on 01/20/2012. 2.  Stable chronic bilateral subdural  hygromas.  Original Report Authenticated By: Britta Mccreedy, M.D.     1. Cerebral hemorrhage       MDM  12:35 PM Pt seen and assessed. CT head ordered. Records from [facility] requested. Looked up pt in PACS - I don't see an old CT/MR for the pt.   2:32 PM Dr. Lynelle Doctor has d/w radiology - MRI study in PACS found yesterday and compared. 25x20 mm hemorrhage unchanged from previous.  3:28 PM Discussed with Dr. Lyman Speller with neuro. Will be admitted to neuro for observation. Findings d/w pt and family.         Grant Fontana, [place] 01/20/12 1531

## 2012-01-20 NOTE — ED Notes (Addendum)
Per EMS - Patient experiencing headache on Sunday and was seen at [facility] yesterday (CT performed)  and was told she had a "wet stroke"  And was released from the hospital and was instructed to follow up with her doctor.  Doctors office called patient and they instructed the patient to go to [facility]. Headache originally started on the left side temporal area to back of hear and is now on the right side temporal radiating to the back of the head. Patient states she can not see out of her right side. Stroke screen negative, CBG normal, ekg -1st degree block regular rhythm. No allergies.

## 2012-01-20 NOTE — ED Notes (Signed)
Patient remains  on monitor and states she is still in pain. Family at bedside.

## 2012-01-20 NOTE — ED Notes (Signed)
Assigned room changed to 3038 not clean.

## 2012-01-20 NOTE — H&P (Signed)
Chief Complaint: "cannot see from the right side of her vision"  HPI: [name] [name] is an 78 y.o. female who had a sudden onset inability to see from the right side of her vision on Sunday morning while in church. She went to see her physician later and had imaging of her brain that showed a left occipital hemorrhagic stroke.  LSN: Sunday morning  tPA Given: No: out of the window  mRankin: 0  Past Medical History   Diagnosis  Date   .  IBS (irritable bowel syndrome)    .  Diverticulosis    .  Colon polyps    .  High cholesterol    .  MI (myocardial infarction)  1975   .  GERD (gastroesophageal reflux disease)    .  Hypertension    .  Renal stone    .  Bronchiectasis, non-tuberculous    .  DVT (deep venous thrombosis)      Resolved, no anticoagulation currently   .  MAC (mycobacterium avium-intracellulare complex)  11/2011    Past Surgical History   Procedure  Date   .  Total abdominal hysterectomy    .  Appendectomy    .  Bladder tack    .  Cholecystectomy    .  Colon surgery      12 inches colectomy s/p diverticulitis   .  Tonsillectomy     Family History   Problem  Relation  Age of Onset   .  Stomach cancer  Brother    .  Heart disease  Father    .  Heart attack  Mother    Social History: reports that she has never smoked. She has never used smokeless tobacco. She reports that she does not drink alcohol or use illicit drugs.  Allergies:  Allergies   Allergen  Reactions   .  Darvon      Drop in BP   Medications: I have reviewed the patient's current medications.  ROS:  as above  Physical Examination:  Blood pressure 173/70, pulse 65, temperature 98.5 F (36.9 C), temperature source Oral, resp. rate 17, SpO2 97.00%.  Neurologic Examination:  Neurological exam: AAO*3. No aphasia. Followed complex commands. Cranial nerves: EOMI, PERRL. Right field cut. Sensation to V1 through V3 areas of the face was intact and symmetric throughout. There was no facial asymmetry. Hearing  to finger rub was equal and symmetrical bilaterally. Shoulder shrug was 5/5 and symmetric bilaterally. Head rotation was 5/5 bilaterally. There was no dysarthria or palatal deviation.Motor: strength was 5/5 and symmetric throughout. Sensory: was intact throughout to light touch, pinprick. Coordination: finger-to-nose and heel-to-shin were intact and symmetric bilaterally. Reflexes: were 2+ in upper extremities and 1+ at the knees and 1+ at the ankles. Plantar response was downgoing bilaterally. Gait: deferred  Results for orders placed during the hospital encounter of 01/20/12 (from the past 48 hour(s))   CBC Status: Abnormal    Collection Time    01/20/12 1:03 PM   Component  Value  Range  Comment    WBC  5.7  4.0 - 10.5 (K/uL)     RBC  4.41  3.87 - 5.11 (MIL/uL)     Hemoglobin  13.7  12.0 - 15.0 (g/dL)     HCT  16.1  09.6 - 46.0 (%)     MCV  91.8  78.0 - 100.0 (fL)     MCH  31.1  26.0 - 34.0 (pg)     MCHC  33.8  30.0 - 36.0 (g/dL)     RDW  40.9  81.1 - 15.5 (%)     Platelets  149 (*)  150 - 400 (K/uL)    DIFFERENTIAL Status: Normal    Collection Time    01/20/12 1:03 PM   Component  Value  Range  Comment    Neutrophils Relative  63  43 - 77 (%)     Neutro Abs  3.5  1.7 - 7.7 (K/uL)     Lymphocytes Relative  27  12 - 46 (%)     Lymphs Abs  1.5  0.7 - 4.0 (K/uL)     Monocytes Relative  8  3 - 12 (%)     Monocytes Absolute  0.5  0.1 - 1.0 (K/uL)     Eosinophils Relative  2  0 - 5 (%)     Eosinophils Absolute  0.1  0.0 - 0.7 (K/uL)     Basophils Relative  1  0 - 1 (%)     Basophils Absolute  0.0  0.0 - 0.1 (K/uL)    BASIC METABOLIC PANEL Status: Abnormal    Collection Time    01/20/12 1:03 PM   Component  Value  Range  Comment    Sodium  144  135 - 145 (mEq/L)     Potassium  3.9  3.5 - 5.1 (mEq/L)     Chloride  107  96 - 112 (mEq/L)     CO2  27  19 - 32 (mEq/L)     Glucose, Bld  96  70 - 99 (mg/dL)     BUN  12  6 - 23 (mg/dL)     Creatinine, Ser  9.14  0.50 - 1.10 (mg/dL)      Calcium  9.5  8.4 - 10.5 (mg/dL)     GFR calc non Af Amer  58 (*)  >90 (mL/min)     GFR calc Af Amer  68 (*)  >90 (mL/min)    POCT I-STAT TROPONIN I Status: Normal    Collection Time    01/20/12 1:22 PM   Component  Value  Range  Comment    Troponin i, poc  0.01  0.00 - 0.08 (ng/mL)     Comment 3      Ct Head Wo Contrast  01/20/2012 *RADIOLOGY REPORT* Clinical Data: Headache and right visual field cut. The patient was told yesterday at [facility] she had a small hemorrhage. CT HEAD WITHOUT CONTRAST Technique: Contiguous axial images were obtained from the base of the skull through the vertex without contrast. Comparison: Brain MRI 01/19/2012 and head CT 04/09/2011 Findings: Acute hemorrhage in the left occipital lobe with mild surrounding edema appears similar in size compared to the brain MRI performed yesterday, 01/19/2012, given differences in technique. The hemorrhage measures 25 by 20 mm in axial diameter. No additional areas of hemorrhage are identified. The ventricles are within normal limits for size. 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 Mild age related cerebral volume loss is stable. There is no midline shift. Foramen magnum is patent. No evidence of herniation. Minimal thickening of some of the ethmoid air cells is noted. No air-fluid levels are seen in the sinuses. The skull is intact. Visualized soft tissues of the scalp and orbits show no acute findings. IMPRESSION: 1. Left occipital acute hemorrhage with mild surrounding edema. No significant interval change compared to the brain MRI performed  01/19/2012. Findings discussed by telephone with Dr. Lynelle Doctor at 2:23 pm on 01/20/2012. 2. Stable chronic bilateral subdural hygromas. Original Report Authenticated By: Britta Mccreedy, M.D.  Assessment: 78 y.o. female with left hemorrhagic stroke and a right field cut since Sunday  Stroke Risk  Factors - none  Plan:  1. Keep SBP < 140  2. Admit for overnight observation  3. Obtain MRI reports from outside facility/physician  4. Manage risk factors  5. Neurochecks q4h  6. Vital signs q4h  Vaudine Dutan  01/20/2012, 4:22 PM

## 2012-01-20 NOTE — ED Notes (Signed)
Pt able to ambulate and use restroom without assistance.

## 2012-01-21 ENCOUNTER — Encounter (HOSPITAL_COMMUNITY): Payer: Self-pay | Admitting: *Deleted

## 2012-01-21 DIAGNOSIS — I619 Nontraumatic intracerebral hemorrhage, unspecified: Secondary | ICD-10-CM

## 2012-01-21 MED ORDER — STROKE: EARLY STAGES OF RECOVERY BOOK
Freq: Once | Status: AC
  Administered 2012-01-21: 1
  Filled 2012-01-21: qty 1

## 2012-01-21 MED ORDER — ATORVASTATIN CALCIUM 20 MG PO TABS
20.0000 mg | ORAL_TABLET | Freq: Every day | ORAL | Status: DC
  Administered 2012-01-21 – 2012-01-22 (×2): 20 mg via ORAL
  Filled 2012-01-21 (×3): qty 1

## 2012-01-21 NOTE — Progress Notes (Signed)
Stroke Team Progress Note  HISTORY [name] [name] is an 78 y.o. female who had a sudden onset inability to see  from the right side of her vision on Sunday morning 01/17/12 while in church. She went to see her physician later and had imaging of her brain that showed a left occipital hemorrhagic stroke. She was admitted for observation and further workup.  SUBJECTIVE Headache has improved. Rt sided  Vision deficit is persistent. No h/o dementia or hypertension.  OBJECTIVE Filed Vitals:   01/20/12 2100 01/20/12 2200 01/21/12 0200 01/21/12 0600  BP: 158/74 129/71 121/69 137/73  Pulse: 58 59 58 61  Temp: 97.8 F (36.6 C) 97.6 F (36.4 C) 97.9 F (36.6 C) 98.5 F (36.9 C)  TempSrc:      Resp: 16 16 16 16   Height: 5' 6.5" (1.689 m)     Weight: 71.85 kg (158 lb 6.4 oz)     SpO2: 91% 97% 96% 98%    CBG (last 3) No results found for this basename: GLUCAP:3 in the last 72 hours Intake/Output from previous day:   IV Fluid Intake:    Medications    .  stroke: mapping our early stages of recovery book   Does not apply Once  . amLODipine  5 mg Oral Daily  . amLODipine  5 mg Oral Once  . fentaNYL  25 mcg Intravenous Once  . gi cocktail  30 mL Oral Once  .  morphine injection  2 mg Intravenous Once  .  morphine injection  4 mg Intravenous Once  . ondansetron (ZOFRAN) IV  4 mg Intravenous Once  . simvastatin  40 mg Oral Daily  . simvastatin  40 mg Oral Once  PRN hydrALAZINE, HYDROcodone-acetaminophen  Diet:  General thin liquids Activity:  Bathroom privileges DVT :  SCDs   Significant Diagnostic Studies: CBC    Component Value Date/Time   WBC 5.7 01/20/2012 1303   RBC 4.41 01/20/2012 1303   HGB 13.7 01/20/2012 1303   HCT 40.5 01/20/2012 1303   PLT 149* 01/20/2012 1303   MCV 91.8 01/20/2012 1303   MCH 31.1 01/20/2012 1303   MCHC 33.8 01/20/2012 1303   RDW 13.4 01/20/2012 1303   LYMPHSABS 1.5 01/20/2012 1303   MONOABS 0.5 01/20/2012 1303   EOSABS 0.1 01/20/2012 1303   BASOSABS 0.0  01/20/2012 1303   CMP    Component Value Date/Time   NA 144 01/20/2012 1303   K 3.9 01/20/2012 1303   CL 107 01/20/2012 1303   CO2 27 01/20/2012 1303   GLUCOSE 96 01/20/2012 1303   BUN 12 01/20/2012 1303   CREATININE 0.88 01/20/2012 1303   CALCIUM 9.5 01/20/2012 1303   GFRNONAA 58* 01/20/2012 1303   GFRAA 68* 01/20/2012 1303   COAGS No results found for this basename: INR, PROTIME   Lipid Panel No results found for this basename: chol, trig, hdl, cholhdl, vldl, ldlcalc   HgbA1C  No results found for this basename: HGBA1C   Urine Drug Screen  No results found for this basename: labopia, cocainscrnur, labbenz, amphetmu, thcu, labbarb    Alcohol Level No results found for this basename: eth   CT of the brain   01/20/12 1.  Left occipital acute hemorrhage with mild surrounding edema. No significant interval change compared to the brain MRI performed 01/19/2012. Findings discussed by telephone with Dr. Lynelle Doctor at 2:23 pm on 01/20/2012. 2.  Stable chronic bilateral subdural hygromas.  MRI of the brain  Prior to admission shows left occipital  parenchymal hematoma with mild mass effect. No IVH or midline shift.  MRA of the brain    2D Echocardiogram  Not ordered  Carotid Doppler  Not ordered  CXR  Not ordered   EKG  normal sinus rhythm, 1st degree AV block.   Physical Exam   Frail elderly Caucasian lady not in distress. Afebrile. Neck is supple. No bruit. Cardiac exam no murmur or gallop. Lungs clear to auscultation. Neurological exam Awake  Alert oriented x 3. Normal speech and language.eye movements full without nystagmus. Face symmetric. Tongue midline.Dense right homonymous hemianopsia. Recall 3/3. Animal Naming 7.  Normal strength, tone, reflexes and coordination. Normal sensation. Gait deferred.   ASSESSMENT [name] is a 78 y.o. female with a stable left occipital hemorrhage since 01/17/12, secondary to hypertension. On BP meds for secondary stroke  prevention.  -hypertension, stable -hyperlipidemia, on zocor  Hospital day # 1  TREATMENT/PLAN -Continue amlodipine for secondary stroke prevention.  -therapy evals -ongoing risk factor control - D/w daughter in law and patient. Likely DC home in am -DC eye patch as no utillty for vision loss/dizziness/headache  01/21/2012 8:29 AM

## 2012-01-21 NOTE — Consult Note (Signed)
Physical Medicine and Rehabilitation Consult Reason for Consult: Left occipital acute hemorrhage Referring Phsyician: Dr. Clovis Pu is an 78 y.o. female.   HPI: 78 year old right-handed female admitted March 13 with sudden onset decreased vision on the right side while in church. Cranial CT scan showed left occipital acute hemorrhage with mild surrounding edema. Full workup currently underway. No significant interval change compared to MRI performed March 12 at outside facility. Blood pressures monitored on Norvasc. She is tolerating a regular diet. Physical occupational therapy evaluations completed today March 14 with recommendations for physical medicine rehabilitation consult  Review of Systems  Eyes: Positive for blurred vision and double vision.  Gastrointestinal: Positive for constipation.  All other systems reviewed and are negative.   Past Medical History  Diagnosis Date  . IBS (irritable bowel syndrome)   . Diverticulosis   . Colon polyps   . High cholesterol   . MI (myocardial infarction) 1975  . GERD (gastroesophageal reflux disease)   . Hypertension   . Renal stone   . Bronchiectasis, non-tuberculous   . DVT (deep venous thrombosis)     Resolved, no anticoagulation currently  . 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 Total abdominal hysterectomy   . Appendectomy   . Bladder tack   . Cholecystectomy   . Colon surgery     12inches colectomy s/p diverticulitis  . Tonsillectomy    Family History  Problem Relation Age of Onset  . Stomach cancer Brother   . Heart disease Father   . Heart attack Mother    Social History:  reports that she has never smoked. She has never used smokeless tobacco. She reports that she does not drink alcohol or use illicit drugs. Allergies:  Allergies  Allergen Reactions  . Darvon     Drop in  BP   Medications Prior to Admission  Medication Dose Route Frequency  Provider Last Rate Last Dose  .  stroke: mapping our early stages of recovery book   Does not apply Once Carmell Austria, MD   1 each at 01/21/12 0529  . amLODipine (NORVASC) tablet 5 mg  5 mg Oral Daily Carmell Austria, MD   5 mg at 01/21/12 1046  . amLODipine (NORVASC) tablet 5 mg  5 mg Oral Once Carmell Austria, MD   5 mg at 01/20/12 1847  . atorvastatin (LIPITOR) tablet 20 mg  20 mg Oral q1800 Carmell Austria, MD      . fentaNYL (SUBLIMAZE) injection 25 mcg  25 mcg Intravenous Once Grant Fontana, [place]   25 mcg at 01/20/12 1647  . gi cocktail (Maalox,Lidocaine,Donnatal)  30 mL Oral Once Carmell Austria, MD   30 mL at 01/20/12 1646  . hydrALAZINE (APRESOLINE) injection 10 mg  10 mg Intravenous Q4H PRN Lajuana Carry, MD      . HYDROcodone-acetaminophen [facility]) 5-325 MG per tablet 1 tablet  1 tablet Oral Q4H PRN Lajuana Carry, MD   1 tablet at 01/21/12 0528  . morphine 2 MG/ML injection 2 mg  2 mg Intravenous Once Grant Fontana, PA   2 mg at 01/20/12 1435  . morphine 4 MG/ML injection 4 mg  4 mg Intravenous Once Grant Fontana, [place]   4 mg at 01/20/12 1606  . ondansetron (ZOFRAN) injection 4 mg  4 mg Intravenous Once Grant Fontana, [place]   4 mg at 01/20/12 1606  . simvastatin (ZOCOR) tablet 40 mg  40 mg Oral Once Carmell Austria, MD  40 mg at 01/20/12 1846  . DISCONTD: simvastatin (ZOCOR) tablet 40 mg  40 mg Oral Daily Carmell Austria, MD   40 mg at 01/21/12 1046   Medications Prior to Admission  Medication Sig Dispense Refill  . aspirin 81 MG tablet Take 81 mg by mouth daily.      Marland Kitchen atorvastatin (LIPITOR) 20 MG tablet Take 1 tablet by mouth Daily.        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 Functional Status:  Mobility: Bed Mobility Bed Mobility: Yes Rolling Left: 5: Supervision Left Sidelying to Sit: 5: Supervision;HOB flat Supine to Sit: 6: Modified independent (Device/Increase time);HOB flat Transfers Sit to Stand: 4: Min assist;With upper extremity assist;From bed Sit to Stand Details (indicate cue type and reason): minguard assist due to pt's report of feeling dizzy or "faint/weak"; wide base of support; BP stable see Vitals Stand to Sit: 4: Min assist;With upper extremity assist;With armrests;To chair/3-in-1 Stand to Sit Details: minguard assist due to unsteady and faint/dizzy feeling Ambulation/Gait Ambulation/Gait: Yes Ambulation/Gait Assistance: 4: Min assist Ambulation/Gait Assistance Details (indicate cue type and reason): wide base of support; unsteady with losses of balance/staggering to either side noted; pt guarded at her waist/pelvis with no UE support  Ambulation Distance (Feet): 160 Feet Assistive device: None Gait Pattern: Step-through pattern;Decreased stride length Gait velocity: 1.25 ft/sec  (<1.8 ft/sec indicates incr risk of falls)    ADL: ADL Grooming: Simulated;Wash/dry hands;Moderate assistance Where Assessed - Grooming: Sitting, chair;Supported Location manager Dressing: Performed;Moderate assistance Where Assessed - Upper Body Dressing: Unsupported;Sitting, bed (don second gown as robe) Toilet Transfer: Customer service manager Method: Proofreader: Raised toilet seat with arms (or 3-in-1 over toilet) Ambulation Related to ADLs: Pt ambulating ~150 ft with Min (A). Pt with posterior lean and c/o "fainty feeling" Pt with Rt eye blinking and closing eye during ambulation. Pt closing eye completely for all turning. Pt with Lt eye covered with patch due to diplopia and pt Rt side dominant. Pt describes seeing "half" of things. ADL Comments: Pt with visual deficits. Pt demonstrates Lt side hemianopsia and diplopia affecting all ADLS. Pt with balance deficits with all mobility and adls  Cognition: Cognition Arousal/Alertness: Awake/alert Orientation Level: Oriented X4 Cognition Arousal/Alertness: Awake/alert Overall Cognitive Status: Impaired Attention: Impaired Current Attention Level: Selective Attention - Other Comments: difficulty attending due to visual changes Memory: Appears impaired Memory Deficits: pt reports decreased memory, states "doctor says something wrong with my brain but I cant remember what" Orientation Level: Oriented X4 Following Commands: Follows one step commands with increased time;Follows multi-step commands with increased time Cognition - Other Comments: delayed responses; ? word-finding problems; reports difficulty expressing herself  Blood pressure 111/64, pulse 65, temperature 97.7 F (36.5 C), temperature source Oral, resp. rate 18, height 5' 6.5" (1.689 m), weight 71.85 kg (158 lb 6.4 oz), SpO2 94.00%. Physical Exam  Vitals reviewed. Constitutional: She is oriented to person, place, and time. She appears well-developed.  HENT:  Head: Normocephalic.  Eyes:       Right visual field cut with pupils reactive to light  Neck: Normal range of motion. Neck supple. No thyromegaly present.  Cardiovascular: Regular rhythm.   Pulmonary/Chest: Breath sounds normal. She has no wheezes.  Abdominal: She exhibits no distension. There is no tenderness.   Musculoskeletal: She exhibits no edema.  Neurological: She is alert and oriented to person, place, and time.  Skin: Skin is warm and dry.  Psychiatric: She has a normal mood and affect.   neuro: Right field cut on confrontation testing. No evidence of nystagmus. Extraocular muscles intact. Motor strength is 4/5 in bilateral deltoid, biceps, triceps, grip, hip flexor, knee extensors, and ankle dorsiflexor Sensory is intact to light touch bilaterally in the upper and lower extremities. Tone is normal in bilateral upper and lower extremities  Results for orders placed during the hospital encounter of 01/20/12 (from the past 24 hour(s))  CBC     Status: Abnormal   Collection Time   01/20/12  1:03 PM      Component Value Range   WBC 5.7  4.0 - 10.5 (K/uL)   RBC 4.41  3.87 - 5.11 (MIL/uL)   Hemoglobin 13.7  12.0 - 15.0 (g/dL)   HCT 95.6  21.3 - 08.6 (%)   MCV 91.8  78.0 - 100.0 (fL)   MCH 31.1  26.0 - 34.0 (pg)   MCHC 33.8  30.0 - 36.0 (g/dL)   RDW 57.8  46.9 - 62.9 (%)   Platelets 149 (*) 150 - 400 (K/uL)  DIFFERENTIAL     Status: Normal   Collection Time   01/20/12  1:03 PM      Component Value Range   Neutrophils Relative 63  43 - 77 (%)   Neutro Abs 3.5  1.7 - 7.7 (K/uL)   Lymphocytes Relative 27  12 - 46 (%)   Lymphs Abs 1.5  0.7 - 4.0 (K/uL)   Monocytes Relative 8  3 - 12 (%)   Monocytes Absolute 0.5  0.1 - 1.0 (K/uL)   Eosinophils Relative 2  0 - 5 (%)   Eosinophils Absolute 0.1  0.0 - 0.7 (K/uL)   Basophils Relative 1  0 - 1 (%)   Basophils Absolute 0.0  0.0 - 0.1 (K/uL)  BASIC METABOLIC PANEL     Status: Abnormal   Collection Time   01/20/12  1:03 PM      Component Value Range   Sodium 144  135 - 145 (mEq/L)   Potassium 3.9  3.5 - 5.1 (mEq/L)   Chloride 107  96 - 112 (mEq/L)   CO2 27  19 - 32 (mEq/L)   Glucose, Bld 96  70 - 99 (mg/dL)   BUN 12  6 - 23 (mg/dL)   Creatinine, Ser 5.28  0.50 - 1.10 (mg/dL)   Calcium 9.5  8.4 - 41.3 (mg/dL)   GFR calc non Af Amer 58 (*)  >90 (mL/min)   GFR calc Af Amer 68 (*) >90 (mL/min)  POCT I-STAT TROPONIN I     Status: Normal   Collection Time   01/20/12  1:22 PM      Component Value Range   Troponin i, poc 0.01  0.00 - 0.08 (ng/mL)   Comment 3            Ct Head Wo Contrast  01/20/2012  *RADIOLOGY REPORT*  Clinical  Data: Headache and right visual field cut.  The patient was told yesterday at [facility] she had a small hemorrhage.  CT HEAD WITHOUT CONTRAST  Technique:  Contiguous axial images were obtained from the base of the skull through the vertex without contrast.  Comparison: Brain MRI 01/19/2012 and head CT 04/09/2011  Findings: Acute hemorrhage in the left occipital lobe with mild surrounding edema appears similar in size compared to the brain MRI performed yesterday, 01/19/2012, given differences in technique. The hemorrhage measures 25 by 20 mm in axial diameter.  No additional areas of hemorrhage are identified.  The ventricles are within normal limits for size.  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  Mild age related cerebral volume loss is stable.  There is no midline shift. Foramen magnum is patent.  No evidence of herniation.  Minimal thickening of some of the ethmoid air cells is noted.  No air-fluid levels are seen in the sinuses.  The skull is intact. Visualized soft tissues of the scalp and orbits show no acute findings.  IMPRESSION:  1.  Left occipital acute hemorrhage with mild surrounding edema. No significant interval change compared to the brain MRI performed 01/19/2012. Findings discussed by telephone with Dr. Lynelle Doctor at 2:23 pm on 01/20/2012. 2.  Stable chronic bilateral subdural hygromas.  Original Report Authenticated By: Britta Mccreedy, M.D.    Assessment/Plan: Diagnosis: Left occipital hemorrhage with right homonymous hemianopsia and visual spatial deficits 1. Does the need for close, 24 hr/day  medical supervision in concert with the patient's rehab needs make it unreasonable for this patient to be served in a less intensive setting? Yes 2. Co-Morbidities requiring supervision/potential complications: Coronary artery disease, hypertension, history of MI, headache 3. Due to bladder management, bowel management, safety, skin/wound care, disease management, medication administration, pain management and patient education, does the patient require 24 hr/day rehab nursing? Yes 4. Does the patient require coordinated care of a physician, rehab nurse, PT (1-2 hrs/day, 5 days/week), OT (1-2 hrs/day, 5 days/week) and SLP (0.5-1 hrs/day, 5 days/week) to address physical and functional deficits in the context of the above medical diagnosis(es)? Yes Addressing deficits in the following areas: balance, endurance, locomotion, strength, transferring, bowel/bladder control, bathing, dressing, toileting and cognition 5. Can the patient actively participate in an intensive therapy program of at least 3 hrs of therapy per day at least 5 days per week? Yes 6. The potential for patient to make measurable gains while on inpatient rehab is good 7. Anticipated functional outcomes upon discharge from inpatients are supervision mobility PT, supervision ADLs OT, compensate for field cut during reading activities SLP 8. Estimated rehab length of stay to reach the above functional goals is: 10 days 9. Does the patient have adequate social supports to accommodate these discharge functional goals? Yes 10. Anticipated D/C setting: Home 11. Anticipated post D/C treatments: HH therapy 12. Overall Rehab/Functional Prognosis: good  RECOMMENDATIONS: This patient's condition is appropriate for continued rehabilitative care in the following setting: CIR Patient has agreed to participate in recommended program. Yes Note that insurance prior authorization may be required for reimbursement for recommended  care.  Comment:   ANGIULLI,DANIEL J. 01/21/2012

## 2012-01-21 NOTE — Evaluation (Addendum)
Occupational Therapy Evaluation Patient Details Name: [name] [name] MRN: [id number] DOB: 03/30/1924 Today's Date: 01/21/2012  Problem List:  Patient Active Problem List  Diagnoses  . IBS (irritable bowel syndrome)  . Diverticulosis  . High cholesterol  . MI (myocardial infarction)  . GERD (gastroesophageal reflux disease)  . Hypertension  . Bronchiectasis, non-tuberculous  . MAC (mycobacterium avium-intracellulare complex)    Past Medical History:  Past Medical History  Diagnosis Date  . IBS (irritable bowel syndrome)   . Diverticulosis   . Colon polyps   . High cholesterol   . MI (myocardial infarction) 1975  . GERD (gastroesophageal reflux disease)   . Hypertension   . Renal stone   . Bronchiectasis, non-tuberculous   . DVT (deep venous thrombosis)     Resolved, no anticoagulation currently  . 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 Total abdominal hysterectomy   . Appendectomy   . Bladder tack   . Cholecystectomy   . Colon surgery     12inches colectomy s/p diverticulitis  . Tonsillectomy     OT Assessment/Plan/Recommendation OT Assessment Clinical Impression Statement: 78 yo female whose symptoms began on Sunday during church. Pt was unable to read church documents. Pt followed up on Monday with eye doctor who then referred pt to primary doctor. Pt's primary doctor ordered CT scan revealing left occipital hemorrhagic stroke. Pt reports coming to ED due to worsening headache and continued blurred vision / seeing half. Pt currently with balance deficits affecting all mobility and adls. OT to follow acutely. OT recommends CIR for d/c planning OT Recommendation/Assessment: Patient will need skilled OT in the acute care venue OT Problem List: Decreased activity tolerance;Impaired balance (sitting and/or standing);Impaired vision/perception;Decreased coordination;Decreased  cognition;Decreased safety awareness;Decreased knowledge of use of DME or AE;Decreased knowledge of precautions OT Therapy Diagnosis : Generalized weakness;Disturbance of vision;Cognitive deficits;Other (comment) (visual changes) OT Plan OT Frequency: Min 2X/week OT Treatment/Interventions: Self-care/ADL training;DME and/or AE instruction;Therapeutic activities;Cognitive remediation/compensation;Visual/perceptual remediation/compensation;Patient/family education;Balance training OT Recommendation Recommendations for Other Services: Rehab consult Follow Up Recommendations: Inpatient Rehab Equipment Recommended: Defer to next venue Individuals Consulted Consulted and Agree with Results and Recommendations: Family member/caregiver;Patient (daughter in law Lurena Joiner present) OT Goals Acute Rehab OT Goals OT Goal Formulation: With patient/family Time For Goal Achievement: 2 weeks ADL Goals Pt Will Perform Grooming: Standing at sink;Unsupported;with cueing (comment type and amount) (, questioning cues min (A)) ADL Goal: Grooming - Progress: Goal set today Pt Will Perform Upper Body Bathing: Sitting, chair;Unsupported;with cueing (comment type and amount) (, questioning cueing MIN (A)) ADL Goal: Upper Body Bathing - Progress: Goal set today Pt Will Perform Upper Body Dressing: Other (comment);Sitting, chair;Unsupported (min guard (A)) ADL Goal: Upper Body Dressing - Progress: Goal set today Pt Will Transfer to Toilet: 3-in-1 (min guard (A)) ADL Goal: Toilet Transfer - Progress: Goal set today Pt Will Perform Toileting - Clothing Manipulation: Sitting on 3-in-1 or toilet (min guard (A)) ADL Goal: Toileting - Clothing Manipulation - Progress: Goal set today Pt Will Perform Toileting - Hygiene: Sitting on 3-in-1 or toilet (min guard (A)) ADL Goal: Toileting - Hygiene - Progress: Goal set today  OT Evaluation Precautions/Restrictions  Precautions Precautions: Fall Restrictions Weight Bearing  Restrictions: No Prior Functioning Home Living Lives With: Spouse Type of Home: House Home Layout: Able to live on main level with bedroom/bathroom (has full basement) Home Access: Stairs to enter Entrance Stairs-Rails: None Entrance Stairs-Number of Steps: 2 Bathroom Shower/Tub: Walk-in  shower;Door [employer] Toilet: Handicapped height Bathroom Accessibility: Yes Home Adaptive Equipment: Shower chair with back Additional Comments: husband has parkinson and COPD 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 Granddaughter stays occasionally with pt/spouse. 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 Pt with posterior lean and c/o "fainty feeling" Pt with Rt eye blinking and closing eye during ambulation. Pt closing eye completely for all turning. Pt with Lt eye covered with patch due to diplopia and pt Rt side dominant. Pt describes seeing "half" of things. ADL Comments: Pt with visual deficits. Pt demonstrates LR side hemianopsia and diplopia affecting all ADLS. Pt with balance deficits with all mobility and adls Vision/Perception  Vision - History Baseline Vision: No visual deficits Patient Visual Report: Diplopia;Blurring of vision;Other (comment) (Rt side hemianopsia visual cut) Vision -  Assessment Eye Alignment: Impaired (comment) Vision Assessment: Vision tested Ocular Range of Motion: Impaired-to be further tested in functional context (closing eyes and crossing back to midline with Lt tracking) Tracking/Visual Pursuits: Decreased smoothness of horizontal tracking;Decreased smoothness of vertical tracking;Decreased smoothness of eye movement to LEFT superior field;Decreased smoothness of eye movement to LEFT inferior field;Requires cues, head turns, or add eye shifts to track;Unable to hold eye position out of midline Saccades: Additional eye shifts occurred during testing Convergence: Impaired (comment) Visual Fields: Right homonymous hemianopsia Diplopia Assessment: Objects split on top of one another Depth Perception: impaired Cognition Cognition Arousal/Alertness: Awake/alert Overall Cognitive Status: Impaired Attention: Impaired Current Attention Level: Selective Memory: Appears impaired Memory Deficits: pt reports decreased memory, states "doctor says something wrong with my brain but I cant remember what" Orientation Level: Oriented X4 Following Commands: Follows one step commands with increased time;Follows multi-step commands with increased time Cognition - Other Comments: pt requires increased time to complete questioning with delayed response Sensation/Coordination Sensation Light Touch: Appears Intact (pt reports no numbness) Coordination Gross Motor Movements are Fluid and Coordinated: Yes Fine Motor Movements are Fluid and Coordinated: Not tested Extremity Assessment RUE Assessment RUE Assessment: Within Functional Limits LUE Assessment LUE Assessment: Within Functional Limits Mobility  Bed Mobility Bed Mobility: Yes Rolling Left: 5: Supervision Left Sidelying to Sit: 5: Supervision;HOB flat Supine to Sit: 5: Supervision;HOB flat Transfers Transfers: Yes Sit to Stand: With upper extremity assist;From bed;5: Supervision (min guard (A) ) Sit to  Stand Details (indicate cue type and reason): posterior lean and reporting feeling dizzy and "fainty". Pt consistently described feeling like she was going to faint. pt with blood pressure within normal range and documented in vital signs. Stand to Sit: To chair/3-in-1;With armrests;With upper extremity assist;4: Min assist (min guard (A)) Exercises   End of Session OT - End of Session Equipment Utilized During Treatment: Gait belt Activity Tolerance: Patient tolerated treatment well Patient left: in chair;with call bell in reach Nurse Communication: Mobility status for transfers;Mobility status for ambulation General Behavior During Session: [facility] for tasks performed Cognition: Impaired  Pt's daughter in law Lurena Joiner) is visiting from out of state and currently unable to provided length of stay.  Granddaughter currently available to (A) pt and husband will be available 24/ 7 providing Min Guard (A).  Pt stated in session : "we just remodeled our house so that we can die in our home." Pt strongly requesting to return to farm house living at d/c.   Lucile Shutters 01/21/2012, 10:30 AM  Pager: [phone]

## 2012-01-21 NOTE — Evaluation (Signed)
Physical Therapy Evaluation Patient Details Name: [name] [name] MRN: [id number] DOB: [birth year] Today's Date: 01/21/2012  Problem List:  Patient Active Problem List  Diagnoses  . IBS (irritable bowel syndrome)  . Diverticulosis  . High cholesterol  . MI (myocardial infarction)  . GERD (gastroesophageal reflux disease)  . Hypertension  . Bronchiectasis, non-tuberculous  . MAC (mycobacterium avium-intracellulare complex)    Past Medical History:  Past Medical History  Diagnosis Date  . IBS (irritable bowel syndrome)   . Diverticulosis   . Colon polyps   . High cholesterol   . MI (myocardial infarction) 1975  . GERD (gastroesophageal reflux disease)   . Hypertension   . Renal stone   . Bronchiectasis, non-tuberculous   . DVT (deep venous thrombosis)     Resolved, no anticoagulation currently  . 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 Total abdominal hysterectomy   . Appendectomy   . Bladder tack   . Cholecystectomy   . Colon surgery     12inches colectomy s/p diverticulitis  . Tonsillectomy     PT Assessment/Plan/Recommendation Clinical Impression Statement: Pt admitted s/p Lt occipital hemorrhagic stroke with mild surrounding edema resulting in NIHSS score of one, Rt visual field cut, and decr balance and gait (? cerebellar component to her symptoms due to edema). Pt unsteady on her feet and complains of dizziness throughout session (although denies a sense of vertigo). Pt will benefit from continued PT to address balance and safety issues. PT Recommendation/Assessment: Patient will need skilled PT in the acute care venue PT Problem List: Decreased activity tolerance;Decreased balance;Decreased mobility;Decreased knowledge of use of DME Barriers to Discharge: Decreased caregiver support Barriers to Discharge Comments: ? 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 PT Therapy Diagnosis : Difficulty walking PT Frequency: Min 4X/week PT Treatment/Interventions: DME instruction;Gait training;Stair training;Functional mobility training;Therapeutic activities;Balance training;Patient/family education Recommendations for Other Services: Rehab consult Follow Up Recommendations: Inpatient Rehab (with goal of modified independent on d/c to home) Equipment Recommended: Defer to next venue PT Goals  Acute Rehab PT Goals PT Goal Formulation: With patient Time For Goal Achievement: 7 days Pt will go Sit to Supine/Side: with modified independence PT Goal: Sit to Supine/Side - Progress: Goal set today Pt will go Sit to Stand: with modified independence PT Goal: Sit to Stand - Progress: Goal set today Pt will go Stand to Sit: with modified independence PT Goal: Stand to Sit - Progress: Goal set today Pt will Stand: with modified independence;1 - 2 min;with unilateral upper extremity support PT Goal: Stand - Progress: Goal set today Pt will Ambulate: >150 feet;with supervision;with least restrictive assistive device;with gait velocity >(comment) ft/second (>1.8 ft/sec) PT Goal: Ambulate - Progress: Goal set today Pt will Go Up / Down Stairs: 1-2 stairs;with min assist;with least restrictive assistive device PT Goal: Up/Down Stairs - Progress: Goal set today Pt will Perform Home Exercise Program: with supervision, verbal cues required/provided;Other (comment) (for balance) PT Goal: Perform Home Exercise Program - Progress: Goal set today  PT Evaluation Precautions/Restrictions  Precautions Precautions: Fall Restrictions Weight Bearing Restrictions: No Prior Functioning  Home Living Lives With: Spouse;Other (Comment) (22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 word-finding problems; reports difficulty expressing herself Sensation/Coordination Sensation Light Touch: Appears Intact (bil legs) Coordination Gross Motor Movements are Fluid and Coordinated: Yes Fine Motor Movements are Fluid and Coordinated: Not tested Extremity Assessment RUE Assessment RUE Assessment: Within Functional Limits LUE Assessment LUE Assessment: Within Functional Limits RLE Assessment RLE Assessment: Within Functional Limits LLE Assessment LLE Assessment: Within Functional Limits Mobility (including Balance) Bed Mobility Bed Mobility: Yes Rolling Left: 5: Supervision Left Sidelying to Sit: 5: Supervision;HOB flat Supine to Sit: 6: Modified independent (Device/Increase time);HOB flat Transfers Sit to Stand: 4: Min assist;With upper extremity assist;From  bed Sit to Stand Details (indicate cue type and reason): minguard assist due to pt's report of feeling dizzy or "faint/weak"; wide base of support; BP stable see Vitals Stand to Sit: 4: Min assist;With upper extremity assist;With armrests;To chair/3-in-1 Stand to Sit Details: minguard assist due to unsteady and faint/dizzy feeling Ambulation/Gait Ambulation/Gait: Yes Ambulation/Gait Assistance: 4: Min assist Ambulation/Gait Assistance Details (indicate cue type and reason): wide base of support; unsteady with losses of balance/staggering to either side noted; pt guarded at her waist/pelvis with no UE support  Ambulation Distance (Feet): 160 Feet Assistive device: None Gait Pattern: Step-through pattern;Decreased stride length Gait velocity: 1.25 ft/sec  (<1.8 ft/sec indicates incr risk of falls)  Posture/Postural Control Posture/Postural Control: No significant limitations Balance Balance Assessed: Yes Static Sitting Balance Static Sitting - Balance Support: No upper extremity supported;Feet supported Static Sitting - Level of Assistance: 5: Stand by assistance Static Sitting - Comment/# of Minutes: + anterior/posterior sway; lots of eye blinking both with and without Lt eye patched (done by OT) Static Standing Balance Static Standing - Balance Support: No upper extremity supported Static Standing - Level of Assistance: 4: Min assist Static Standing - Comment/# of Minutes: + sway. wide stance High Level Balance High Level Balance Activites: Direction changes;Sudden stops High Level Balance Comments: pt with increased dizziness with 180 degree turn and required standing rest period Exercise    End of Session PT - End of Session Equipment Utilized During Treatment: Gait belt Activity Tolerance: Other (comment) (limited by dizzy/faint feeling (likely due to vision changes) Patient left: in chair;with call bell in reach;with family/visitor present (daughter-in-law  present) General Behavior During Session: [facility] for tasks performed Cognition: Impaired  Mayo Faulk 01/21/2012, 11:23 AM  Pager [phone]

## 2012-01-22 MED ORDER — SENNOSIDES-DOCUSATE SODIUM 8.6-50 MG PO TABS
1.0000 | ORAL_TABLET | Freq: Once | ORAL | Status: AC
  Administered 2012-01-22: 1 via ORAL

## 2012-01-22 NOTE — Progress Notes (Addendum)
Physical Therapy Treatment Patient Details Name: [name] [name] MRN: [id number] DOB: 04-20-1924 Today's Date: 01/22/2012  PT Assessment/Plan Comments on Treatment Session: Pt with Lt occipital hemorrhage with some improvement in symptoms this date.  Also appears to have peripheral vestibular hypofunction compounding her balance and dizziness problems. Discussed need for 24/7 supervision due to visual changes and pt unsure her friends or family can provide. PT Plan: Discharge plan needs to be updated;Frequency remains appropriate (pt's insurance has denied to pay for inpt Rehab) PT Frequency: Min 4X/week Follow Up Recommendations: Home health PT;Supervision/Assistance - 24 hour (rec 24/7 assist at least initial few days) Equipment Recommended: Other (comment) (TBA--may need RW, will try on next visit) PT Goals  Acute Rehab PT Goals PT Goal: Sit to Supine/Side - Progress: Met PT Goal: Sit to Stand - Progress: Progressing toward goal PT Goal: Stand to Sit - Progress: Progressing toward goal PT Goal: Stand - Progress: Progressing toward goal PT Goal: Ambulate - Progress: Progressing toward goal  PT Treatment Precautions/Restrictions  Precautions Precautions: Fall Restrictions Weight Bearing Restrictions: No Mobility (including Balance) Bed Mobility Bed Mobility: Yes Supine to Sit: 6: Modified independent (Device/Increase time);HOB flat Transfers Sit to Stand: 4: Min assist;With upper extremity assist;From bed Sit to Stand Details (indicate cue type and reason): minguard assist with wide BOS Stand to Sit: 4: Min assist;With upper extremity assist;With armrests;To chair/3-in-1 Stand to Sit Details: minguard assist for safety due to dizziness Ambulation/Gait Ambulation/Gait Assistance: 4: Min assist Ambulation/Gait Assistance Details (indicate cue type and reason): wide base of support; poor ability to alter speed; unable to stay on straight path (veers to Lt and to Rt) even when looking  straight ahead at a target; see DGI Ambulation Distance (Feet): 250 Feet Assistive device: None Gait Pattern: Step-through pattern;Decreased stride length  Posture/Postural Control Posture/Postural Control: No significant limitations Static Sitting Balance Static Sitting - Balance Support: No upper extremity supported;Feet unsupported Static Sitting - Level of Assistance: 7: Independent Static Standing Balance Static Standing - Balance Support: No upper extremity supported Static Standing - Level of Assistance: Other (comment) Dynamic Gait Index Level Surface: Moderate Impairment Change in Gait Speed: Moderate Impairment Gait with Horizontal Head Turns: Severe Impairment (becomes dizzy, stops, incr eyeblinking as trying to focus) Gait with Vertical Head Turns: Moderate Impairment Gait and Pivot Turn: Moderate Impairment Step Over Obstacle: Severe Impairment Step Around Obstacles: Mild Impairment Steps: Moderate Impairment Total Score: 7  High Level Balance High Level Balance Comments: Pt continues to report incr dizziness with head turns and turning with walking.  Attempted testing for peripheral vestibular hypofunction as pt reports h/o Rt earache last week. Denies changes in hearing (and able to hear fingers rubbing equally on Lt and Rt). Pt with significant difficulty with vestibular occular reflex testing (even when target placed in her Lt field of vision). She was able to maintain eyes on target for slow head turns x 1-3 reps, however then loses target, closes eyes, and reports incr dizziness (suggestive of a vestibular hypofunction). Denies vertigo. Exercise    End of Session PT - End of Session Equipment Utilized During Treatment: Gait belt Activity Tolerance: Patient limited by fatigue;Other (comment) (limited by dizzy/faint feeling (likely due to vision changes) Patient left: in chair;with call bell in reach  Matvey Llanas 01/22/2012, 3:02 PM Pager [phone]

## 2012-01-22 NOTE — Progress Notes (Signed)
Rehab admissions - I did speak with patient, husband, and two family members.  They are okay with home with [facility] follow-up.  I have let Dr. Pearlean Brownie know this.  Patient will probably be discharged tomorrow am according to Dr. Pearlean Brownie.  Pager [phone]

## 2012-01-22 NOTE — Progress Notes (Signed)
Occupational Therapy Treatment Patient Details Name: [name] [name] MRN: [id number] DOB: 10-10-1924 Today's Date: 01/22/2012  OT Assessment/Plan OT Assessment/Plan Comments on Treatment Session: Pt progressing this session however pt with balance deficits that affect all ADLs and mobility. Pt with visual field cut and reporting diplopia inconsistently during session. Pt has great rehab potential to reach MOD I.  OT Plan: Discharge plan remains appropriate OT Frequency: Min 2X/week Recommendations for Other Services: Rehab consult Follow Up Recommendations: Inpatient Rehab Equipment Recommended: Defer to next venue OT Goals Acute Rehab OT Goals OT Goal Formulation: With patient/family Time For Goal Achievement: 2 weeks ADL Goals Pt Will Perform Grooming: Standing at sink;Unsupported;with cueing (comment type and amount) ADL Goal: Grooming - Progress: Progressing toward goals Pt Will Perform Upper Body Bathing: Sitting, chair;Unsupported;with cueing (comment type and amount) ADL Goal: Upper Body Bathing - Progress: Progressing toward goals Pt Will Perform Upper Body Dressing: Other (comment);Sitting, chair;Unsupported ADL Goal: Upper Body Dressing - Progress: Progressing toward goals Pt Will Transfer to Toilet: 3-in-1;with supervision ADL Goal: Toilet Transfer - Progress: Progressing toward goals Pt Will Perform Toileting - Clothing Manipulation: Sitting on 3-in-1 or toilet ADL Goal: Toileting - Clothing Manipulation - Progress: Not met Pt Will Perform Toileting - Hygiene: Sitting on 3-in-1 or toilet ADL Goal: Toileting - Hygiene - Progress: Not met  OT Treatment Precautions/Restrictions  Precautions Precautions: Fall Restrictions Weight Bearing Restrictions: No   ADL ADL Eating/Feeding: Performed;Modified independent Where Assessed - Eating/Feeding: Chair Lower Body Dressing: Performed;Supervision/safety Where Assessed - Lower Body Dressing: Sitting, bed;Unsupported Toilet  Transfer: Simulated (min guard (A)) Toilet Transfer Method: Ambulating Toilet Transfer Equipment: Raised toilet seat with arms (or 3-in-1 over toilet) Ambulation Related to ADLs: Pt ambulating with swaying gait pattern and dizziness with all turns. Pt unable to maintain head turns with ambulation. ADL Comments: Pt completed two visual test (letter cancellation exercise and drawing 3 figures) Pt scanning 2-3 inches past the edge of the right side of the paper to make sure that all images were within visual field. Pt compensating with head turns to ensure pt scanning complete document without v/cs. Pt missed 3 letters on letter cancellation document (x1 midline and x2 bottom row ) Pt with increased blinking toward the end of the exercise and reporting increased difficulty. Pt missing the Rt side of the house initially drawing  and recognized mistake with self correction. Pt colored portion of flower that was blurry or missing. Pt colored rt side of the flower. Pt reported seeing two of the therapist during session indicating diplopia. Pt states "it only happens sometimes not all the time" pt provided two pens at midline and pt reports seeing 1 pen. therapist moved pens to the Right side and pt reports seeing two pens without head turn. Pt reports during all visual exercises "faint" "dizzy" feeling. Pt unable to maintain visual gaze for stabiliation exercises to check vestibular deficits.  Mobility  Bed Mobility Bed Mobility: Yes Rolling Left: 6: Modified independent (Device/Increase time) Left Sidelying to Sit: HOB flat;6: Modified independent (Device/Increase time) Supine to Sit: 6: Modified independent (Device/Increase time);HOB flat Transfers Transfers: Yes Sit to Stand: 4: Min assist;With upper extremity assist;From bed Sit to Stand Details (indicate cue type and reason): Min Guard (A) due to pt reporting faint feeling with wide base of support, BP stable Stand to Sit: 4: Min assist;With upper  extremity assist;With armrests;To chair/3-in-1 Exercises    End of Session OT - End of Session Equipment Utilized During Treatment: Gait belt Activity Tolerance: Patient  tolerated treatment well Patient left: in chair;with call bell in reach Nurse Communication: Mobility status for transfers;Mobility status for ambulation General Behavior During Session: [facility] for tasks performed Cognition: Impaired  Harrel Carina [facility]  01/22/2012, 9:55 AM Pager: [phone]

## 2012-01-22 NOTE — Progress Notes (Signed)
Rehab admissions - I have received a denial from insurance carrier for acute inpatient rehab.  At this point, options are SNF or home with [facility] follow-up.  I have paged Dr. Pearlean Brownie to let him know of the above.  Pager [phone]

## 2012-01-22 NOTE — Progress Notes (Signed)
*  PRELIMINARY RESULTS* Vascular Ultrasound Carotid Duplex (Doppler) has been completed.  Preliminary findings: Bilaterally no significant ICA stenosis with antegrade vertebral flow.  Farrel Demark RDMS 01/22/2012, 11:02 AM

## 2012-01-22 NOTE — Progress Notes (Signed)
Rehab admissions - Evaluated for possible admission.  I spoke with patient who says husband can provide supervision after rehab stay.  I have called and faxed information to[employer] requesting acute inpatient rehab admission.  Will follow up after I hear back from insurance carrier.  Pager [phone]

## 2012-01-22 NOTE — Progress Notes (Addendum)
Stroke Team Progress Note  HISTORY [name] [name] is an 78 y.o. female who had a sudden onset inability to see  from the right side of her vision on Sunday morning 01/17/12 while in church. She went to see her physician later and had imaging of her brain that showed a left occipital hemorrhagic stroke. She was admitted for observation and further workup.  SUBJECTIVE She has persistent visual field deficits. She is not consistent with h/o diplopia and denies it to me again but occupational therapist has elicited this history from her yesterday and today again but it appears to be transient and intermittent and unlikely to be clinically significant.  OBJECTIVE Filed Vitals:   01/21/12 1823 01/21/12 2238 01/22/12 0207 01/22/12 0700  BP: 117/71 113/68 138/75 129/70  Pulse: 72 73 67 66  Temp: 97.9 F (36.6 C) 97.9 F (36.6 C) 97.7 F (36.5 C) 97.9 F (36.6 C)  TempSrc: Oral Axillary Oral Oral  Resp: 18 20 20 16   Height:      Weight:      SpO2: 92% 94% 94% 95%    CBG (last 3) No results found for this basename: GLUCAP:3 in the last 72 hours Intake/Output from previous day:03/14 0701 - 03/15 0700 In: 840 [P.O.:840] Out: -   IV Fluid Intake:    Medications    . amLODipine  5 mg Oral Daily  . atorvastatin  20 mg Oral q1800  . DISCONTD: simvastatin  40 mg Oral Daily  PRN hydrALAZINE, HYDROcodone-acetaminophen  Diet:  General thin liquids Activity:  Bathroom privileges DVT :  SCDs   Significant Diagnostic Studies: CBC    Component Value Date/Time   WBC 5.7 01/20/2012 1303   RBC 4.41 01/20/2012 1303   HGB 13.7 01/20/2012 1303   HCT 40.5 01/20/2012 1303   PLT 149* 01/20/2012 1303   MCV 91.8 01/20/2012 1303   MCH 31.1 01/20/2012 1303   MCHC 33.8 01/20/2012 1303   RDW 13.4 01/20/2012 1303   LYMPHSABS 1.5 01/20/2012 1303   MONOABS 0.5 01/20/2012 1303   EOSABS 0.1 01/20/2012 1303   BASOSABS 0.0 01/20/2012 1303   CMP    Component Value Date/Time   NA 144 01/20/2012 1303   K 3.9  01/20/2012 1303   CL 107 01/20/2012 1303   CO2 27 01/20/2012 1303   GLUCOSE 96 01/20/2012 1303   BUN 12 01/20/2012 1303   CREATININE 0.88 01/20/2012 1303   CALCIUM 9.5 01/20/2012 1303   GFRNONAA 58* 01/20/2012 1303   GFRAA 68* 01/20/2012 1303   COAGS No results found for this basename: INR,  PROTIME   Lipid Panel No results found for this basename: chol,  trig,  hdl,  cholhdl,  vldl,  ldlcalc   HgbA1C  No results found for this basename: HGBA1C   Urine Drug Screen  No results found for this basename: labopia,  cocainscrnur,  labbenz,  amphetmu,  thcu,  labbarb    Alcohol Level No results found for this basename: eth   CT of the brain   01/20/12 1.  Left occipital acute hemorrhage with mild surrounding edema. No significant interval change compared to the brain MRI performed 01/19/2012. Findings discussed by telephone with Dr. Lynelle Doctor at 2:23 pm on 01/20/2012. 2.  Stable chronic bilateral subdural hygromas.  MRI of the brain  Prior to admission shows left occipital parenchymal hematoma with mild mass effect. No IVH or midline shift.  MRA of the brain  Not ordered   2D Echocardiogram  Not ordered  Carotid  Doppler  ordered  CXR  Not ordered   EKG  normal sinus rhythm, 1st degree AV block.   Physical Exam   Frail elderly Caucasian lady not in distress. Afebrile. Neck is supple. No bruit. Cardiac exam no murmur or gallop. Lungs clear to auscultation. Neurological exam Awake  Alert oriented x 3. Normal speech and language.eye movements full without nystagmus. Face symmetric. Tongue midline.Dense right homonymous hemianopsia.   Normal strength, tone, reflexes and coordination. Normal sensation. Gait deferred.   ASSESSMENT [name] is a 78 y.o. female with a stable left occipital hemorrhage with mild cytotoxic cerebral edema since 01/17/12, secondary to hypertension. On BP meds for secondary stroke prevention. Right visual deficit secondary to occipital hemorrhage   -hypertension,  stable -hyperlipidemia, on zocor  Hospital day # 2  TREATMENT/PLAN -Medicaly stable for inpatient rehab when bed available.F/u as outpatient  2 months.No family at bedside today   01/22/2012 7:36 AM

## 2012-01-23 MED ORDER — AMLODIPINE BESYLATE 5 MG PO TABS: 5.0000 mg | ORAL_TABLET | Freq: Every day | ORAL | Status: DC

## 2012-01-23 NOTE — Discharge Instructions (Signed)
STROKE/TIA DISCHARGE INSTRUCTIONS SMOKING Cigarette smoking nearly doubles your risk of having a stroke & is the single most alterable risk factor  If you smoke or have smoked in the last 12 months, you are advised to quit smoking for your health.  Most of the excess cardiovascular risk related to smoking disappears within a year of stopping.  Ask you doctor about anti-smoking medications  [place] Quit Line: 1-800-QUIT NOW  Free Smoking Cessation Classes ([phone]  CHOLESTEROL Know your levels; limit fat & cholesterol in your diet  Lipid Panel  No results found for this basename: chol, trig, hdl, cholhdl, vldl, ldlcalc      Many patients benefit from treatment even if their cholesterol is at goal.  Goal: Total Cholesterol (CHOL) less than 160  Goal:  Triglycerides (TRIG) less than 150  Goal:  HDL greater than 40  Goal:  LDL (LDLCALC) less than 100   BLOOD PRESSURE American Stroke Association blood pressure target is less that 120/80 mm/Hg  Your discharge blood pressure is:  BP: 137/73 mmHg  Monitor your blood pressure  Limit your salt and alcohol intake  Many individuals will require more than one medication for high blood pressure  DIABETES (A1c is a blood sugar average for last 3 months) Goal HGBA1c is under 7% (HBGA1c is blood sugar average for last 3 months)  Diabetes: No known diagnosis of diabetes    No results found for this basename: HGBA1C     Your HGBA1c can be lowered with medications, healthy diet, and exercise.  Check your blood sugar as directed by your physician  Call your physician if you experience unexplained or low blood sugars.  PHYSICAL ACTIVITY/REHABILITATION Goal is 30 minutes at least 4 days per week    Activity:   Increase activity slowly,, No driving, and Walk with assistance, Therapies:   Physical Therapy: Home Health and Occupational Therapy: Home Health Return to work:  n/a  Activity decreases your risk of heart attack and stroke and makes  your heart stronger.  It helps control your weight and blood pressure; helps you relax and can improve your mood.  Participate in a regular exercise program.  Talk with your doctor about the best form of exercise for you (dancing, walking, swimming, cycling).  DIET/WEIGHT Goal is to maintain a healthy weight  Your discharge diet is: General thin liquids Your height is:  Height: 5' 6.5" (168.9 cm) Your current weight is: Weight: 71.85 kg (158 lb 6.4 oz) Your Body Mass Index (BMI) is:  BMI (Calculated): 25.2   Following the type of diet specifically designed for you will help prevent another stroke.  Your goal weight range is:  ***  Your goal Body Mass Index (BMI) is 19-24.  Healthy food habits can help reduce 3 risk factors for stroke:  High cholesterol, hypertension, and excess weight.  RESOURCES Stroke/Support Group:  Call [phone]  they meet the 3rd Sunday of the month on the Rehab Unit at [facility], [place] ( no meetings June, July & Aug).  STROKE EDUCATION PROVIDED/REVIEWED AND GIVEN TO PATIENT  No aspirin or aspirin containing products, no NSAIDs - motrin, aleve, naproxen, etc Stroke warning signs and symptoms How to activate emergency medical system (call 911). Medications prescribed at discharge. Need for follow-up after discharge. Personal risk factors for stroke. Pneumonia vaccine given:   {STROKE DC YES/NO/DATE:22363} Flu vaccine given:   {STROKE DC YES/NO/DATE:22363} My questions have been answered, the writing is legible, and I understand these instructions.  I will adhere  to these goals & educational materials that have been provided to me after my discharge from the hospital.

## 2012-01-23 NOTE — Discharge Summary (Signed)
Patient ID: [name] [name] [id number] 78 y.o. 1923-12-26  Admit date: 01/20/2012  Discharge date and time: 01/23/2012, 8:27 AM  Admitting Physician: Carmell Austria, MD   Discharge Physician:Dr. Pearlean Brownie  Admission Diagnoses: 1.Left occipital intracerebral hemorrhage possibly from amyloid angiopathy versus hypertension [431] 2. Hypertension 3. Hyperlipidemia 4. IBS (irritable bowel syndrome) 5. Diverticulosis  6. MI (myocardial infarction)  7. GERD (gastroesophageal reflux disease)  8. DVT (deep venous thrombosis) Resolved, no anticoagulation currently  9. MAC (mycobacterium avium-intracellulare complex)    Discharge Diagnoses:  1. Left Occipital Hemorrhagic Stroke- amyloid versus hypertensive 2. Hypertension 3. Hyperlipidemia 4. IBS (irritable bowel syndrome) 5. Diverticulosis  6. MI (myocardial infarction)  7. GERD (gastroesophageal reflux disease)  8. DVT (deep venous thrombosis) Resolved, no anticoagulation currently  9. MAC (mycobacterium avium-intracellulare complex)   Admission Condition: stable  Discharged Condition: good  Indication for Admission: hemorrhagic stroke  Hospital Course: Patient was admitted to [facility] 01/20/12 due to sudden onset inability to see from the right side of her vision on Sunday morning while in church. She went to see her physician later and had imaging of her brain that showed a left occipital hemorrhagic stroke.    The patient remained hemodynamically stable during her hospital stay.  Exam revealed dense right homonymous hemianopsia.  There were no other neurologic deficits.  Stroke work up was completed without significant findings other than CT of the brain 01/20/12 1. Left occipital acute hemorrhage with mild surrounding edema. No significant interval change compared to the brain MRI performed 01/19/2012.  The patient was seen by the rehab team and was felt to be a candidate, however her insurance carrier denied coverage.  The decision was made to  send the patient home with [facility].  She has family support.  On 01/23/12, the patient was felt stable for discharge to home, where she will receive therapy.  Consults:  CIR- Rehab candidate; not covered by ins.  OK for Home Health. PT- peripheral vestibular hypofunction compounding her balance and dizziness problems. Discussed need for 24/7 supervision due to visual changes. Home health PT OT-balance deficits that affect all ADLs and mobility. Pt with visual field cut and reporting diplopia inconsistently during session. Pt has great rehab potential to reach MOD I.  Significant Diagnostic Studies:   01/20/12 CT HEAD WITHOUT CONTRAST  Findings: Acute hemorrhage in the left occipital lobe with mild  surrounding edema appears similar in size compared to the brain MRI performed yesterday, 01/19/2012, given differences in technique. The hemorrhage measures 25 by 20 mm in axial diameter. No additional areas of hemorrhage are identified. The ventricles are within normal limits for size. 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 Mild age related cerebral volume loss is stable. There is no midline shift. Foramen magnum is patent. No evidence of herniation. Minimal thickening of some of the ethmoid air cells is noted. No air-fluid levels are seen in the sinuses. The skull is intact. Visualized soft tissues of the scalp and orbits show no acute findings. IMPRESSION: 1. Left occipital acute hemorrhage with mild surrounding edema. No significant interval change compared to the brain MRI performed 01/19/2012. 2. Stable chronic bilateral subdural hygromas. [name] [name], M.D.  01/22/12 Carotid Duplex (Doppler) has been completed. Preliminary findings: Bilaterally no significant ICA stenosis with antegrade vertebral flow.  Basic Metabolic Panel:  Lab 01/20/12 4098  NA 144  K 3.9  CL 107  CO2 27  GLUCOSE 96  BUN 12    CREATININE 0.88  CALCIUM 9.5  MG --  PHOS --   CBC  Lab 01/20/12 1303  WBC 5.7  NEUTROABS 3.5  HGB 13.7  HCT 40.5  MCV 91.8  PLT 149*    Discharge Exam: Mental Status: Alert, oriented, thought content appropriate.  Speech fluent without evidence of aphasia. Able to follow 3 step commands without difficulty. Cranial Nerves: II-Dense right homonymous hemianopsia.  III/IV/VI-Extraocular movements intact. No nystagmus.  Pupils reactive bilaterally. V/VII-Smile symmetric VIII-grossly intact IX/X-normal gag XI-bilateral shoulder shrug XII-midline tongue extension Motor: 5/5 bilaterally with normal tone and bulk Sensory: light touch intact throughout, bilaterally Deep Tendon Reflexes: 2+ UE, 1+ LE symmetric throughout Plantars: Downgoing bilaterally Cerebellar: Normal finger-to-nose, normal rapid alternating movements and normal heel-to-shin test.    Disposition: stable for discharge to home.  Patient Instructions:  Norvasc 5 mg PO QD Lipitor 20 mg PO QD  No antiplatelet or anticoagulant therapy due to ICH.  Rehab Referral Recommendation: HH PT, OT  Activity: activity as tolerated, no driving Diet: heart healthy  Follow-up with Dr. Pearlean Brownie in 2 months.  Marya Fossa PA-C [facility] [phone] 01/23/2012 8:27 AM

## 2012-01-23 NOTE — Progress Notes (Signed)
Physical Therapy Treatment Patient Details Name: [name] [name] MRN: [id number] DOB: 08/06/1924 Today's Date: 01/23/2012  PT Assessment/Plan  PT - Assessment/Plan Comments on Treatment Session: Pt with Lt occipital hemorrhage and continues to have balance impairement however balance improved with use of RW in hallways.  Continue to recommend 24/7 supervision at d/c and using RW in community for safety. PT Plan: Discharge plan needs to be updated;Frequency remains appropriate PT Frequency: Min 4X/week Follow Up Recommendations: Home health PT;Supervision/Assistance - 24 hour Equipment Recommended: Rolling walker with 5" wheels PT Goals  Acute Rehab PT Goals PT Goal Formulation: With patient Time For Goal Achievement: 7 days Pt will go Sit to Supine/Side: with modified independence PT Goal: Sit to Supine/Side - Progress: Met Pt will go Sit to Stand: with modified independence PT Goal: Sit to Stand - Progress: Progressing toward goal Pt will go Stand to Sit: with modified independence PT Goal: Stand to Sit - Progress: Progressing toward goal Pt will Stand: with modified independence;1 - 2 min;with unilateral upper extremity support PT Goal: Stand - Progress: Progressing toward goal Pt will Ambulate: >150 feet;with supervision;with least restrictive assistive device;with gait velocity >(comment) ft/second PT Goal: Ambulate - Progress: Progressing toward goal Pt will Go Up / Down Stairs: 1-2 stairs;with min assist;with least restrictive assistive device PT Goal: Up/Down Stairs - Progress: Progressing toward goal  PT Treatment Precautions/Restrictions  Precautions Precautions: Fall Restrictions Weight Bearing Restrictions: No Mobility (including Balance) Bed Mobility Bed Mobility: Yes Supine to Sit: 6: Modified independent (Device/Increase time) Transfers Transfers: Yes Sit to Stand: 5: Supervision;From bed Sit to Stand Details (indicate cue type and reason): supervision for safety  with cues for hand placement Stand to Sit: 4: Min assist;To chair/3-in-1;With armrests Stand to Sit Details: MInguard (A) for safety due to dizziness Ambulation/Gait Ambulation/Gait: Yes Ambulation/Gait Assistance: 4: Min assist Ambulation/Gait Assistance Details (indicate cue type and reason): Minguard (A) with cues for proper RW placement.  Pt showing improvement with balance with using RW especially with head turns and increasing speed. Ambulation Distance (Feet): 300 Feet Assistive device: Rolling walker Gait Pattern: Step-through pattern;Decreased stride length Gait velocity: 1.33ft/sec Stairs: No Wheelchair Mobility Wheelchair Mobility: No  Posture/Postural Control Posture/Postural Control: No significant limitations Balance Balance Assessed: Yes Static Sitting Balance Static Sitting - Balance Support: No upper extremity supported;Feet unsupported Static Sitting - Level of Assistance: 7: Independent Static Standing Balance Static Standing - Balance Support: No upper extremity supported Static Standing - Level of Assistance: Other (comment) Static Standing - Comment/# of Minutes: MInguard (A) with wide BOS Exercise    End of Session PT - End of Session Equipment Utilized During Treatment: Gait belt Activity Tolerance: Patient tolerated treatment well Patient left: in chair;with call bell in reach Nurse Communication: Mobility status for transfers;Mobility status for ambulation General Behavior During Session: [facility] for tasks performed Cognition: [facility] for tasks performed  Emmanuel Ercole 01/23/2012, 9:10 AM Jake Shark, PT DPT [phone]

## 2012-01-28 ENCOUNTER — Encounter (INDEPENDENT_AMBULATORY_CARE_PROVIDER_SITE_OTHER): Payer: Medicare PPO | Admitting: Ophthalmology

## 2012-04-13 ENCOUNTER — Ambulatory Visit (INDEPENDENT_AMBULATORY_CARE_PROVIDER_SITE_OTHER)
Admission: RE | Admit: 2012-04-13 | Discharge: 2012-04-13 | Disposition: A | Discharge status: Home / Self Care | Payer: Medicare HMO | Source: Ambulatory Visit | Attending: Critical Care Medicine | Admitting: Critical Care Medicine

## 2012-04-13 ENCOUNTER — Ambulatory Visit (INDEPENDENT_AMBULATORY_CARE_PROVIDER_SITE_OTHER): Payer: Medicare HMO | Admitting: Critical Care Medicine

## 2012-04-13 ENCOUNTER — Encounter: Payer: Self-pay | Admitting: Critical Care Medicine

## 2012-04-13 VITALS — BP 126/80 | HR 61 | Temp 97.7°F | Ht 66.0 in | Wt 154.0 lb

## 2012-04-13 DIAGNOSIS — J479 Bronchiectasis, uncomplicated: Secondary | ICD-10-CM

## 2012-04-13 DIAGNOSIS — R0989 Other specified symptoms and signs involving the circulatory and respiratory systems: Secondary | ICD-10-CM

## 2012-04-13 DIAGNOSIS — R0609 Other forms of dyspnea: Secondary | ICD-10-CM

## 2012-04-13 DIAGNOSIS — A310 Pulmonary mycobacterial infection: Secondary | ICD-10-CM

## 2012-04-13 DIAGNOSIS — R0600 Dyspnea, unspecified: Secondary | ICD-10-CM

## 2012-04-13 LAB — PULMONARY FUNCTION TEST

## 2012-04-13 NOTE — Patient Instructions (Addendum)
A chest xray will be obtained today Pulmonary function studies will be obtained today I will call with results

## 2012-04-13 NOTE — Progress Notes (Addendum)
PFT done today. 

## 2012-04-13 NOTE — Assessment & Plan Note (Signed)
Stable bronchiectasis with pattern c/w MAC in the past New dyspnea likely non pulmonary with normal PFTs and neg CXR Suspect dyspnea cardiac related No evidence for amiodarone toxicity  Plan No further pulmonary w/u. Return prn  Ok to stay on amiodarone.

## 2012-04-13 NOTE — Progress Notes (Addendum)
Subjective:    Patient ID: [name] [name], female    DOB: 1924-02-04, 78 y.o.   MRN: [id number]  HPI  78 y.o. WF   04/13/2012 Now on amiodarone x one month Pt had Afib RVR.  Pt was in hosp.  Pt needs an ablation. No PFTs yet done.  No CXRs done.   No real cough.  Notes more dyspnea with amiodarone.  Pt also had a CVA, deficit now is vision. No real chest pain. No mucus.  NO edema in feet.   Pt denies any significant sore throat, nasal congestion or excess secretions, fever, chills, sweats, unintended weight loss, pleurtic or exertional chest pain, orthopnea PND, or leg swelling Pt denies any increase in rescue therapy over baseline, denies waking up needing it or having any early am or nocturnal exacerbations of coughing/wheezing/or dyspnea. Pt also denies any obvious fluctuation in symptoms with  weather or environmental change or other alleviating or aggravating factors   Past Medical History  Diagnosis Date  . IBS (irritable bowel syndrome)   . Diverticulosis   . Colon polyps   . High cholesterol   . MI (myocardial infarction) 1975  . GERD (gastroesophageal reflux disease)   . Hypertension   . Renal stone   . Bronchiectasis, non-tuberculous   . DVT (deep venous thrombosis)     Resolved, no anticoagulation currently  . MAC (mycobacterium avium-intracellulare complex) 11/2011     Family History  Problem Relation Age of Onset  . Stomach cancer Brother   . Heart disease Father   . Heart attack Mother      History   Social History  . Marital Status: Married    Spouse Name: N/A    Number of Children: 3  . Years of Education: N/A   Occupational History  . Retired     [employer]   Social History Main Topics  . Smoking status: Never Smoker   . Smokeless tobacco: Never Used  . Alcohol Use: No  . Drug Use: No  . Sexually Active: Not on file   Other Topics Concern  . Not on file   Social History Narrative  . No narrative on file     Allergies    Allergen Reactions  . Darvon     Drop in  BP     Outpatient Prescriptions Prior to Visit  Medication Sig Dispense Refill  . atorvastatin (LIPITOR) 20 MG tablet Take 1 tablet by mouth Daily.      Marland Kitchen amLODipine (NORVASC) 5 MG tablet Take 1 tablet (5 mg total) by mouth daily.  30 tablet  2     Review of Systems  Constitutional: Positive for fatigue. Negative for fever, chills, diaphoresis, activity change, appetite change and unexpected weight change.  HENT: Negative for hearing loss, ear pain, nosebleeds, congestion, sore throat, facial swelling, rhinorrhea, sneezing, mouth sores, trouble swallowing, neck pain, neck stiffness, dental problem, voice change, postnasal drip, sinus pressure, tinnitus and ear discharge.   Eyes: Negative for photophobia, discharge, itching and visual disturbance.  Respiratory: Positive for cough, chest tightness and shortness of breath. Negative for apnea, choking, wheezing and stridor.   Cardiovascular: Negative for chest pain, palpitations and leg swelling.  Gastrointestinal: Negative for nausea, vomiting, abdominal pain, constipation, blood in stool and abdominal distention.  Genitourinary: Negative for dysuria, urgency, frequency, hematuria, flank pain, decreased urine volume and difficulty urinating.  Musculoskeletal: Positive for joint swelling. Negative for myalgias, back pain, arthralgias and gait problem.  Skin: Negative for color change,  pallor and rash.  Neurological: Positive for weakness. Negative for dizziness, tremors, seizures, syncope, speech difficulty, light-headedness, numbness and headaches.  Hematological: Negative for adenopathy. Does not bruise/bleed easily.  Psychiatric/Behavioral: Negative for confusion, sleep disturbance and agitation. The patient is not nervous/anxious.        Objective:   Physical Exam  Filed Vitals:   04/13/12 1023  BP: 126/80  Pulse: 61  Temp: 97.7 F (36.5 C)  TempSrc: Oral  Height: 5\' 6"  (1.676 m)   Weight: 69.854 kg (154 lb)  SpO2: 97%    Gen: Pleasant, well-nourished, in no distress,  normal affect  ENT: No lesions,  mouth clear,  oropharynx clear, no postnasal drip  Neck: No JVD, no TMG, no carotid bruits  Lungs: No use of accessory muscles, no dullness to percussion, clear without rales or rhonchi  Cardiovascular: RRR, heart sounds normal, no murmur or gallops, no peripheral edema  Abdomen: soft and NT, no HSM,  BS normal  Musculoskeletal: No deformities, no cyanosis or clubbing  Neuro: alert, non focal  Skin: Warm, no lesions or rashes  CT Chest 11/2011 Bilateral bronchiectasis in RML and L lingula,  Tree in bud nodularity on R lung, LLL ATX  Sputum [employer] 12/07/11 Positive Mycobacterium avium intracellulare        Assessment & Plan:   Bronchiectasis, non-tuberculous History of MAC colonization with bronchiectasis now with increased dyspnea secondary to unclear factors. Patient is on amiodarone 100 mg daily and the patient relates her change in breathing to when this medication was started. It is uncertain if this is the case or that amiodarone is causing actual lung injury at this time. Note CXR negative and PFTs normal .  Doubt amiodarone toxicity. Suspect dyspnea is d/t cardiac issues Plan  Ok to continue amiodarone No further pulm w/u  rov pulm prn     Updated Medication List Outpatient Encounter Prescriptions as of 04/13/2012  Medication Sig Dispense Refill  . amiodarone (PACERONE) 200 MG tablet 1/2 tablet daily      . atorvastatin (LIPITOR) 20 MG tablet Take 1 tablet by mouth Daily.      Marland Kitchen DISCONTD: amLODipine (NORVASC) 5 MG tablet Take 1 tablet (5 mg total) by mouth daily.  30 tablet  2

## 2012-04-13 NOTE — Assessment & Plan Note (Addendum)
History of MAC colonization with bronchiectasis now with increased dyspnea secondary to unclear factors. Patient is on amiodarone 100 mg daily and the patient relates her change in breathing to when this medication was started. It is uncertain if this is the case or that amiodarone is causing actual lung injury at this time. Note CXR negative and PFTs normal .  Doubt amiodarone toxicity. Suspect dyspnea is d/t cardiac issues Plan  Ok to continue amiodarone No further pulm w/u  rov pulm prn

## 2012-04-18 ENCOUNTER — Encounter: Payer: Self-pay | Admitting: Critical Care Medicine

## 2012-04-22 ENCOUNTER — Other Ambulatory Visit: Payer: Self-pay | Admitting: Neurology

## 2012-04-22 DIAGNOSIS — I619 Nontraumatic intracerebral hemorrhage, unspecified: Secondary | ICD-10-CM

## 2012-04-26 ENCOUNTER — Encounter: Payer: Self-pay | Admitting: Critical Care Medicine

## 2012-04-27 ENCOUNTER — Ambulatory Visit: Payer: Medicare HMO | Admitting: Critical Care Medicine

## 2012-05-05 ENCOUNTER — Encounter: Payer: Self-pay | Admitting: *Deleted

## 2012-05-06 ENCOUNTER — Encounter: Payer: Self-pay | Admitting: Internal Medicine

## 2012-05-06 ENCOUNTER — Ambulatory Visit (INDEPENDENT_AMBULATORY_CARE_PROVIDER_SITE_OTHER): Payer: Medicare HMO | Admitting: Internal Medicine

## 2012-05-06 VITALS — BP 165/77 | HR 59 | Ht 66.5 in | Wt 154.0 lb

## 2012-05-06 DIAGNOSIS — I2510 Atherosclerotic heart disease of native coronary artery without angina pectoris: Secondary | ICD-10-CM

## 2012-05-06 DIAGNOSIS — R000 Tachycardia, unspecified: Secondary | ICD-10-CM

## 2012-05-06 DIAGNOSIS — R002 Palpitations: Secondary | ICD-10-CM

## 2012-05-06 NOTE — Patient Instructions (Signed)
Your physician recommends that you schedule a follow-up appointment in: 3 weeks with Dr. [name].- Dr. [name] nurse will call you about this appointment.

## 2012-05-06 NOTE — Assessment & Plan Note (Signed)
Stable on current meds  Could be reasonable candidate for knee surgery

## 2012-05-06 NOTE — Assessment & Plan Note (Signed)
We do not know what this is,  She will try and get records from Dr [name] and [place] so as to be able to evaluate options  For now would conitue amiodarone

## 2012-05-06 NOTE — Progress Notes (Signed)
CARDIOLOGY CONSULT NOTE  Patient ID: [name] [name], MRN: [id number], DOB/AGE: [id number] y.o. Admit date: (Not on file) Date of Consult: 05/06/2012  Primary Physician: [name], MD Primary Cardiologist: Adelfa Koh  Chief Complaint: PALPIATIONS   HPI [name] [name] is a 78 y.o. female  seen at the request of Dr. Sol Passer because of palpitations. She has a long history of palpitations for which she has been evaluated by multiple physicians. The event recorder demonstrated PVCs PVC and nonsustained atrial arrhythmias.  About a month and a half ago she had significant symptoms on Sunday and was admitted to hospital. The nose as described recurrent episodes of wide complex tachycardia that were associated with her symptoms. She says these episodes last seconds to minutes. She was seen by Dr. Tyson in [place] who recommended catheter ablation as amiodarone was felt to be too toxic. The amiodarone she thinks may be making her a little bit more confused. This had no recurrent episodes on the amiodarone.  Recent cardiac evaluation has included a Myoview that was nonischemic with normal left ventricular function. A catheterization 2011 demonstrated modest disease 50-70% throughout. She has a history of remote MI.  Her other concern is whether she could have the surgery. The anesthesiologist at [facility] declined operating on her because of "heart". She was wanting to have the surgery.  Past Medical History  Diagnosis Date  . IBS (irritable bowel syndrome)   . Diverticulosis   . Colon polyps   . High cholesterol   . MI (myocardial infarction) 1975  . GERD (gastroesophageal reflux disease)   . Hypertension   . Renal stone   . Bronchiectasis, non-tuberculous   . DVT (deep venous thrombosis)     Resolved, no anticoagulation currently  . 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 Palpitation   . Pulmonary fibrosis   . AV block     Conduction system      Surgical  History:  Past Surgical History  Procedure Date  . Total abdominal hysterectomy   . Appendectomy   . Bladder tack   . Cholecystectomy   . Colon surgery     12 inches colectomy s/p diverticulitis  . Tonsillectomy      Home Meds: Prior to Admission medications   Medication Sig Start Date End Date Taking? Authorizing Provider  amiodarone (PACERONE) 200 MG tablet 1/2 tablet daily 03/31/12  Yes Historical Provider, MD  atorvastatin (LIPITOR) 20 MG tablet Take 1 tablet by mouth Daily.   Yes Historical Provider, MD    Allergies:  Allergies  Allergen Reactions  . Crestor (Rosuvastatin) Other (See Comments)    Myalgia  . Darvon     Drop in  BP    History   Social History  . Marital Status: Married    Spouse Name: N/A    Number of Children: 3  . Years of Education: N/A   Occupational History  . Retired     [employer]   Social History Main Topics  . Smoking status: Never Smoker   . Smokeless tobacco: Never Used  . Alcohol Use: No  . Drug Use: No  . Sexually Active: Not on file   Other Topics Concern  . Not on file   Social History Narrative  . No narrative on file     Family History  Problem Relation Age of Onset  . Stomach cancer Brother   . Heart disease Father   . Heart attack Mother   . Other Son  Triple Bypass     ROS:  Please see the history of present illness.     All other systems reviewed and negative.    Physical Exam:  Blood pressure 165/77, pulse 59, height 5' 6.5" (1.689 m), weight 154 lb (69.854 kg). General: Well developed, well nourished elderly Caucasian female appearing considerably younger than her stated age in no acute distress. Head: Normocephalic, atraumatic, sclera non-icteric, no xanthomas, nares are without discharge. Lymph Nodes:  none Neck: Negative for carotid bruits. JVD not elevated. Lungs: Clear bilaterally to auscultation without wheezes, rales, or rhonchi. Breathing is unlabored. Heart: RRR with diminishedS1  S2. No murmurs, rubs, or gallops appreciated. Abdomen: Soft, non-tender, non-distended with normoactive bowel sounds. No hepatomegaly. No rebound/guarding. No obvious abdominal masses. Msk:  Strength and tone appear normal for age. Extremities: No clubbing or cyanosis. No edema.  Distal pedal pulses are 2+ and equal bilaterally. Skin: Warm and Dry Neuro: Alert and oriented X 3. CN III-XII intact Grossly normal sensory and motor function . Psych:  Responds to questions appropriately with a normal affect.      Labs: Cardiac Enzymes No results found for this basename: CKTOTAL:4,CKMB:4,TROPONINI:4 in the last 72 hours CBC Lab Results  Component Value Date   WBC 5.7 01/20/2012   HGB 13.7 01/20/2012   HCT 40.5 01/20/2012   MCV 91.8 01/20/2012   PLT 149* 01/20/2012    Radiology/Studies:  Dg Chest 2 View  04/13/2012  *RADIOLOGY REPORT*  Clinical Data: Shortness of breath.  CHEST - 2 VIEW  Comparison: 02/21/2012  Findings: There is hyperinflation of the lungs compatible with COPD.  Scarring in the lung bases.  No confluent airspace opacities or effusions.  No acute bony abnormality.  Heart is upper limits normal in size.  IMPRESSION: COPD/chronic changes.  Original Report Authenticated By: Cyndie Chime, M.D.    EKG: sinus @60  .32/09/41   Assessment and Plan:  [name] [name]

## 2012-05-10 ENCOUNTER — Telehealth: Payer: Self-pay | Admitting: Internal Medicine

## 2012-05-10 NOTE — Telephone Encounter (Signed)
Please return call to patient at [phone] regarding medication discussion.

## 2012-05-10 NOTE — Telephone Encounter (Signed)
I spoke with the patient. She is on amiodarone 200 mg 1/4 of a tablet daily. She states that she has started noticing blood coming to the surface of her skin. She thinks this may be related. She wanted to know Dr. [name] thoughts on what to do with her medication. She is not taking any blood thinners. I explained I would review this with Dr. [name]. I have also received some of her EKG tracings from Dr. Robyne Peers office. I will review these with Dr. [name] as well and then call her back. She is agreeable.

## 2012-05-10 NOTE — Telephone Encounter (Signed)
We reviewed strips from [facility]. There is a wide complex tachycardia most consistent with left bundle branch block aberration. She also has right bundle branch block aberration. The tachycardia is associated with no PR prolongation at initiation, a discernible retrograde P waves in the proximal ST segment, and the loss of retrograde conduction with the termination of tachycardia. This is most consistent with AV reentry.  As relates to the "bleeding in the skin", I don't have any reason to think that this is related to amiodarone. It would be reasonable for her to see her primary care physician and make sure there are no drug effects other than amio causing this or hematological consequences.

## 2012-05-11 NOTE — Telephone Encounter (Signed)
The patient is aware of Dr. [name] recommendations. She is scheduled to come back and see him on 05/30/12.

## 2012-05-11 NOTE — Telephone Encounter (Signed)
Follow-up:    Patient called in wanting you to give her a call back about this issue.  Please call back.

## 2012-05-16 ENCOUNTER — Telehealth: Payer: Self-pay | Admitting: Internal Medicine

## 2012-05-16 NOTE — Telephone Encounter (Signed)
New problem:  Per Lillia Abed   Was told by patient that Dr. [name] approve her to have surgery from cardiology stand point. However, her previous cardiology -Dr. Constance Haw recommended AV- node ablation & pacemaker. Message was sent to office on 7/1. Pre-screen at [facility] hospital - was not approve for surgery by anesthesiology.  Please clarify.

## 2012-05-16 NOTE — Telephone Encounter (Signed)
I spoke with Lillia Abed. I explained that the patient is due to see Dr. [name] back on 05/30/12 for further evaluation of her heart rhythm. I advised that he has explained that she will probably be a candidate for surgery, but he has not cleared her for this yet. We will hopefully know something further after 7/22 in regards to her surgery. Lillia Abed voices understanding.

## 2012-05-30 ENCOUNTER — Ambulatory Visit (INDEPENDENT_AMBULATORY_CARE_PROVIDER_SITE_OTHER): Payer: Medicare HMO | Admitting: Internal Medicine

## 2012-05-30 ENCOUNTER — Encounter: Payer: Self-pay | Admitting: Internal Medicine

## 2012-05-30 VITALS — BP 119/69 | HR 84 | Ht 66.5 in | Wt 152.0 lb

## 2012-05-30 DIAGNOSIS — I2510 Atherosclerotic heart disease of native coronary artery without angina pectoris: Secondary | ICD-10-CM

## 2012-05-30 DIAGNOSIS — I472 Ventricular tachycardia, unspecified: Secondary | ICD-10-CM

## 2012-05-30 MED ORDER — METOPROLOL TARTRATE 25 MG PO TABS: 25.0000 mg | ORAL_TABLET | Freq: Two times a day (BID) | ORAL | Status: DC

## 2012-05-30 MED ORDER — METOPROLOL SUCCINATE ER 25 MG PO TB24: 25.0000 mg | ORAL_TABLET | Freq: Every day | ORAL | Status: DC

## 2012-05-30 MED ORDER — ATENOLOL 25 MG PO TABS
25.0000 mg | ORAL_TABLET | Freq: Two times a day (BID) | ORAL | Status: DC
Start: 2012-05-30 — End: 2012-08-26

## 2012-05-30 NOTE — Assessment & Plan Note (Signed)
The patient has wide-complex tachycardia. I have reviewed these extensively with her. I last my colleagues to review them as well.  I think if these represent ventricular tachycardia. Her PR interval at baseline is 300-400 ms and PR interval is associated with PVCs that looked very much like the morphology of these beats is a short as 100 ms suggesting isorhythmic dissociation and there biventricular pattern.  She has not tolerated her amiodarone. We will discontinue it. I have given her prescriptions for 3 different beta blockers and will see how she does. These are atenolol 25, metoprolol succinate and tartrate. We will start low-dose seen because of her conduction system disease. I reviewed with her the side effects  Left ventricular function is normal at her last Myoview scan not withstanding her prior MI. These are possibly but not likely related to infarction substrate

## 2012-05-30 NOTE — Progress Notes (Signed)
  HPI  [name] [name] is a 78 y.o. female Was seen last month for evaluation of palpitations previously identified as PACs and nonsustained atrial arrhythmias. While in hospital in April she had significant symptoms associated with wide-complex tachycardia for which she saw Dr. [name] in for which catheter ablation was recommended as opposed amiodarone.  Because of insurance issues she came to see [place]. She is on amiodarone at the time. She did not have stress with her.  She is currently taking amiodarone 50 mg a day and feels like it is significantly responsible for shortness of breath which she is experiencing not withstanding her diagnosis of pulmonary fibrosis and MAC/bronchiectasis  She does note however that there have been no significant palpitations since she has been on medication .  Past Medical History  Diagnosis Date  . IBS (irritable bowel syndrome)   . Diverticulosis   . Colon polyps   . High cholesterol   . MI (myocardial infarction) 1975  . GERD (gastroesophageal reflux disease)   . Hypertension   . Renal stone   . Bronchiectasis, non-tuberculous   . DVT (deep venous thrombosis)     Resolved, no anticoagulation currently  . 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 Palpitation   . Pulmonary fibrosis   . AV block     Conduction system    Past Surgical History  Procedure Date  . Total abdominal hysterectomy   . Appendectomy   . Bladder tack   . Cholecystectomy   . Colon surgery     12inches colectomy s/p diverticulitis  . Tonsillectomy     Current Outpatient Prescriptions  Medication Sig Dispense Refill  . amiodarone (PACERONE) 200 MG tablet 1/4 tablet daily      . atorvastatin (LIPITOR) 20 MG tablet Take 1 tablet by mouth Daily.        Allergies  Allergen Reactions  . Crestor (Rosuvastatin) Other (See Comments)    Myalgia  . Darvon     Drop in  BP    Review of Systems negative except from HPI and PMH  Physical Exam BP 119/69   Pulse 84  Ht 5' 6.5" (1.689 m)  Wt 152 lb (68.947 kg)  BMI 24.17 kg/m2 Well developed and nourished in no acute distress HENT normal Neck supple with JVP-flat Clear Regular rate and rhythm, no murmurs or gallops Abd-soft with active BS No Clubbing cyanosis edema Skin-warm and dry A & Oriented  Grossly normal sensory and motor function    Assessment and  Plan

## 2012-05-30 NOTE — Assessment & Plan Note (Signed)
I have reviewed the strips extensively and with the patient.  I will ask Dr. Johney Frame and Dr. Ladona Ridgel to review them as well.  I suspect that these are ventricular in origin. She has prolonged PR interval and there are very closely coupled P waves with some of these ectopic beats making me think that these are ventricular and atrial closely morphologically similar to wide-complex tachycardia making me think that is all ventricular origin. She has near normal left ventricular function apparently by Myoview not withstanding a prior history of MI. I suggest that we tried treating him with beta blockers. She has not tolerated amiodarone well even at 50 mg a day. We have reviewed the side effects of the beta blockers and I've given her prescriptions for atenolol, metoprolol tartrate and metoprolol succinate. We'll plan to review again how she is doing in 8-10 weeks.

## 2012-05-30 NOTE — Assessment & Plan Note (Addendum)
As above  she is reported to have normal left ventricular function and a nonischemic Myoview  Beta blockers will be useful for secondary prevention

## 2012-05-30 NOTE — Patient Instructions (Signed)
Your physician recommends that you schedule a follow-up appointment in: 8-10 WEEKS  WITH DR [name] Your physician has recommended you make the following change in your medication: STOP AMIODARONE  THEN IN 2 WEEKS  TRY ONE OF THREE SCRIPTS  GIVEN  FOR APPROX  1 MONTH  AT A TIME  TO SEE WHICH ONE TOLERATES  THE BEST

## 2012-06-01 ENCOUNTER — Telehealth: Payer: Self-pay | Admitting: Internal Medicine

## 2012-06-01 NOTE — Telephone Encounter (Signed)
Patient aware.  Will forward to Dr Chryl Heck with [name] [name] in [place]

## 2012-06-01 NOTE — Telephone Encounter (Signed)
Spoke with patient and she is to have orthoscopic surgery of the right knee.  This has not been set up yet.  She needs clearance from Dr [name] as the will have to put her to sleep for procedure.  Will forward to Dr [name]

## 2012-06-01 NOTE — Telephone Encounter (Signed)
New msg Pt needs a surgical clearance to be sent to Dr Benard Rink in [place]. Please call

## 2012-06-01 NOTE — Telephone Encounter (Signed)
With normal myoveiw and echo in recent months, she would be acceptable risk for surgery [facility]

## 2012-06-07 ENCOUNTER — Telehealth: Payer: Self-pay | Admitting: Internal Medicine

## 2012-06-07 NOTE — Telephone Encounter (Signed)
ERROR

## 2012-06-15 ENCOUNTER — Encounter: Payer: Self-pay | Admitting: Internal Medicine

## 2012-08-11 ENCOUNTER — Ambulatory Visit: Payer: Medicare HMO | Admitting: Internal Medicine

## 2012-08-19 ENCOUNTER — Ambulatory Visit (INDEPENDENT_AMBULATORY_CARE_PROVIDER_SITE_OTHER): Payer: Medicare HMO | Admitting: Internal Medicine

## 2012-08-19 ENCOUNTER — Encounter: Payer: Self-pay | Admitting: Internal Medicine

## 2012-08-19 VITALS — BP 172/82 | HR 51 | Resp 16 | Ht 66.0 in | Wt 149.5 lb

## 2012-08-19 DIAGNOSIS — I472 Ventricular tachycardia: Secondary | ICD-10-CM

## 2012-08-19 DIAGNOSIS — R5381 Other malaise: Secondary | ICD-10-CM

## 2012-08-19 DIAGNOSIS — R5383 Other fatigue: Secondary | ICD-10-CM

## 2012-08-19 DIAGNOSIS — R002 Palpitations: Secondary | ICD-10-CM

## 2012-08-19 HISTORY — DX: Other fatigue: R53.83

## 2012-08-19 NOTE — Assessment & Plan Note (Signed)
The patient continues to have some palpitations. For right now though her major complaint is fatigue. I would like to continue her on her beta blocker. Her blood pressure is quite elevated. We do not have room to work because of bradycardia using more beta blocker; they're somewhat possibility that we could get arrhythmic benefit from a calcium blocker like amlodipine and I will defer this to Dr. Sol Passer when she is seen next week to consider for her blood pressure.

## 2012-08-19 NOTE — Progress Notes (Signed)
,skf Patient Care Team: [name], MD as PCP - General (Unknown Physician Specialty) Storm Frisk, MD as Attending Physician (Pulmonary Disease)   HPI  [name] [name] is a 78 y.o. female Seen in followup for palpitations which had been associated with PACs atrial arrhythmias and wide-complex tachycardia. Dr. [name] in [facility] had recommended catheter ablation as he was opposed amiodarone. When I saw her she was on amiodarone at 50 mg a day and not tolerating that. Reviewing of the strips were consistent with a ventricular origin. This occurred in the context of normal left ventricular function by Myoview scanning, not withstanding her prior history of MI.  We elected to treat her with a beta blocker at that time and gave her prescriptions for 3 different ones to see which she would tolerate.  She started atenolol and that seemed to be doing pretty well. She never progressed to the others.  She underwent knee replacement surgery in August and this was complicated subsequently by pulmonary embolism. She has a history of a prior stroke. She has a history of previous pulmonary embolism apparently. She is currently taking Rivaroxaban at 15 mg a day.  She complains of fatigue.  She says that this is worse now than was 2 weeks ago. She's had blood work done by Dr. Sol Passer recently. We do not have that information.  She's had some pounding recently. This was as recently as yesterday. She took her vital signs. Her blood pressure was 170 and her pulse was 80.   carries a r diagnosis of pulmonary fibrosis and MAC/bronchiectasis  She does note however that there have been no significant palpitations since she has been on medication .   Past Medical History  Diagnosis Date  . IBS (irritable bowel syndrome)   . Diverticulosis   . Colon polyps   . High cholesterol   . MI (myocardial infarction) 1975    Normal left ventricular function and nonischemic Myoview 2013  . GERD (gastroesophageal  reflux disease)   . Hypertension   . Renal stone   . Bronchiectasis, non-tuberculous   . DVT (deep venous thrombosis)     Resolved, no anticoagulation currently  . 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 Palpitation     PACs and PVCs  . Pulmonary fibrosis   . First degree AV block     Conduction system  . Wide-complex tachycardia     I presume this is VT    Past Surgical History  Procedure Date  . Total abdominal hysterectomy   . Appendectomy   . Bladder tack   . Cholecystectomy   . Colon surgery     12inches colectomy s/p diverticulitis  . Tonsillectomy     Current Outpatient Prescriptions  Medication Sig Dispense Refill  . atenolol (TENORMIN) 25 MG tablet Take 1 tablet (25 mg total) by mouth 2 (two) times daily.  60 tablet  1  . atorvastatin (LIPITOR) 20 MG tablet Take 1 tablet by mouth Daily.      . metoprolol succinate (TOPROL XL) 25 MG 24 hr tablet Take 1 tablet (25 mg total) by mouth daily.  30 tablet  1  . metoprolol tartrate (LOPRESSOR) 25 MG tablet Take 1 tablet (25 mg total) by mouth 2 (two) times daily.  60 tablet  1    Allergies  Allergen Reactions  . Crestor (Rosuvastatin) Other (See Comments)    Myalgia  . Darvon     Drop in  BP    Review of Systems  negative except from HPI and PMH  Physical Exam BP 172/82  Pulse 51  Resp 16  Ht 5\' 6"  (1.676 m)  Wt 149 lb 8 oz (67.813 kg)  BMI 24.13 kg/m2 Well developed and well nourished in no acute distress HENT normal E scleral and icterus clear Neck Supple JVP flat; carotids brisk and full Clear to ausculation  Regular rate and rhythm, early systolic murmur Soft with active bowel sounds No clubbing cyanosis none Edema Alert and oriented, grossly normal motor and sensory function Skin Warm and Dry  Electrocardiogram demonstrates sinus rhythm at 51 Intervals of 33/09/44  he has not essentially changed from March 2030 Assessment and  Plan

## 2012-08-19 NOTE — Patient Instructions (Addendum)
Your physician recommends that you schedule a follow-up appointment in: 3 months with Dr Klein  

## 2012-08-19 NOTE — Assessment & Plan Note (Signed)
As above.

## 2012-08-19 NOTE — Assessment & Plan Note (Signed)
Is not clear what is causing the fatigue. She is to see Dr. Sol Passer next week. She made some comment about her blood count being low. I've asked her to ask Dr. Sol Passer to form any blood work so that I can use it and trying to formulate a next step.

## 2012-08-25 ENCOUNTER — Other Ambulatory Visit: Payer: Self-pay | Admitting: Orthopedic Surgery

## 2012-08-26 ENCOUNTER — Encounter (HOSPITAL_COMMUNITY): Payer: Self-pay | Admitting: Pharmacist

## 2012-08-26 ENCOUNTER — Telehealth: Payer: Self-pay | Admitting: Internal Medicine

## 2012-08-26 NOTE — Telephone Encounter (Signed)
Patient called because she is scheduled to have knee replacement surgery on 09/09/12 by Dr. [name] Md. Pt had a orthoscopy surgery on right knee on august this year and had a pulomonary embolism after. Pt states the surgeon should have faxed  a surgical clearance form to  be filled out. Pt would like to know when Dr. [name] clears her for surgery. Pt was seen last on 08/19/12.

## 2012-08-26 NOTE — Telephone Encounter (Signed)
Pt needs surgical clearance for   knee replacement on 11-1 by dr Molly Maduro dough , pls call pt [phone]

## 2012-08-30 ENCOUNTER — Ambulatory Visit (INDEPENDENT_AMBULATORY_CARE_PROVIDER_SITE_OTHER): Payer: Medicare HMO | Admitting: Critical Care Medicine

## 2012-08-30 ENCOUNTER — Encounter (INDEPENDENT_AMBULATORY_CARE_PROVIDER_SITE_OTHER): Payer: Medicare HMO

## 2012-08-30 ENCOUNTER — Encounter: Payer: Self-pay | Admitting: Critical Care Medicine

## 2012-08-30 VITALS — BP 144/82 | HR 64 | Temp 97.4°F | Ht 66.0 in | Wt 152.8 lb

## 2012-08-30 DIAGNOSIS — I8290 Acute embolism and thrombosis of unspecified vein: Secondary | ICD-10-CM

## 2012-08-30 DIAGNOSIS — I749 Embolism and thrombosis of unspecified artery: Secondary | ICD-10-CM

## 2012-08-30 DIAGNOSIS — M79609 Pain in unspecified limb: Secondary | ICD-10-CM

## 2012-08-30 DIAGNOSIS — Z86711 Personal history of pulmonary embolism: Secondary | ICD-10-CM

## 2012-08-30 DIAGNOSIS — I2699 Other pulmonary embolism without acute cor pulmonale: Secondary | ICD-10-CM

## 2012-08-30 DIAGNOSIS — M7989 Other specified soft tissue disorders: Secondary | ICD-10-CM

## 2012-08-30 HISTORY — DX: Personal history of pulmonary embolism: Z86.711

## 2012-08-30 NOTE — Progress Notes (Signed)
Subjective:    Patient ID: [name] [name], female    DOB: 1924/02/18, 78 y.o.   MRN: [id number]  HPI  78 y.o. WF   08/30/2012 Pt was ok until saw PCP with R knee replacement. PCP? If lungs ok for surgery.  OrthoLuiz Blare. Pt with PE in 9/13: pt had lower back pain and dyspnea.  Adm via ED at [facility]. Pt on coumadin at first then changed to Xarelto 15mg  daily .   Now no dyspnea, Prior hx of DVT 40+ years ago without recurrence.   Past Medical History  Diagnosis Date  . IBS (irritable bowel syndrome)   . Diverticulosis   . Colon polyps   . High cholesterol   . MI (myocardial infarction) 1975    Normal left ventricular function and nonischemic Myoview 2013  . GERD (gastroesophageal reflux disease)   . Hypertension   . Renal stone   . Bronchiectasis, non-tuberculous   . DVT (deep venous thrombosis)     Resolved, no anticoagulation currently  . 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 Palpitation     PACs and PVCs  . Pulmonary fibrosis   . First degree AV block     Conduction system  . Wide-complex tachycardia     I presume this is VT  . Pulmonary embolism     Following knee surgery with a prior history     Family History  Problem Relation Age of Onset  . Stomach cancer Brother   . Heart disease Father   . Heart attack Mother   . Other Son     Triple Bypass     History   Social History  . Marital Status: Married    Spouse Name: N/A    Number of Children: 3  . Years of Education: N/A   Occupational History  . Retired     [employer]   Social History Main Topics  . Smoking status: Never Smoker   . Smokeless tobacco: Never Used  . Alcohol Use: No  . Drug Use: No  . Sexually Active: Not on file   Other Topics Concern  . Not on file   Social History Narrative  . No narrative on file     Allergies  Allergen Reactions  . Crestor (Rosuvastatin) Other (See Comments)    Myalgia  . Darvon     Drop in  BP     Outpatient  Prescriptions Prior to Visit  Medication Sig Dispense Refill  . atorvastatin (LIPITOR) 20 MG tablet Take 20 mg by mouth Daily.       . metoprolol tartrate (LOPRESSOR) 25 MG tablet Take 25 mg by mouth 2 (two) times daily.      . Rivaroxaban (XARELTO) 15 MG TABS tablet Take 15 mg by mouth daily with supper.         Review of Systems  Constitutional: Positive for fatigue. Negative for fever, chills, diaphoresis, activity change, appetite change and unexpected weight change.  HENT: Negative for hearing loss, ear pain, nosebleeds, congestion, sore throat, facial swelling, rhinorrhea, sneezing, mouth sores, trouble swallowing, neck pain, neck stiffness, dental problem, voice change, postnasal drip, sinus pressure, tinnitus and ear discharge.   Eyes: Negative for photophobia, discharge, itching and visual disturbance.  Respiratory: Positive for cough, chest tightness and shortness of breath. Negative for apnea, choking, wheezing and stridor.   Cardiovascular: Negative for chest pain, palpitations and leg swelling.  Gastrointestinal: Negative for nausea, vomiting, abdominal pain, constipation, blood in stool and abdominal distention.  Genitourinary: Negative for dysuria, urgency, frequency, hematuria, flank pain, decreased urine volume and difficulty urinating.  Musculoskeletal: Positive for joint swelling. Negative for myalgias, back pain, arthralgias and gait problem.  Skin: Negative for color change, pallor and rash.  Neurological: Positive for weakness. Negative for dizziness, tremors, seizures, syncope, speech difficulty, light-headedness, numbness and headaches.  Hematological: Negative for adenopathy. Does not bruise/bleed easily.  Psychiatric/Behavioral: Negative for confusion, disturbed wake/sleep cycle and agitation. The patient is not nervous/anxious.        Objective:   Physical Exam  Filed Vitals:   08/30/12 1202  BP: 144/82  Pulse: 64  Temp: 97.4 F (36.3 C)  TempSrc: Oral    Height: 5\' 6"  (1.676 m)  Weight: 152 lb 12.8 oz (69.31 kg)  SpO2: 98%    Gen: Pleasant, well-nourished, in no distress,  normal affect  ENT: No lesions,  mouth clear,  oropharynx clear, no postnasal drip  Neck: No JVD, no TMG, no carotid bruits  Lungs: No use of accessory muscles, no dullness to percussion, clear without rales or rhonchi  Cardiovascular: RRR, heart sounds normal, no murmur or gallops, no peripheral edema  Abdomen: soft and NT, no HSM,  BS normal  Musculoskeletal: No deformities, no cyanosis or clubbing  Neuro: alert, non focal  Skin: Warm, no lesions or rashes   CT Chest 07/2012:  Bilateral PE        Assessment & Plan:   Pulmonary embolism Hx of pulmonary embolism 07/2012.  Note current venous doppler neg for DVT.  Pt stable on xarelto. This pt may undergo knee replacement, she will need to stop xarelto prior to surgery and will need to resume same as soon as possible postop. No indication for IVC filter preop.      Updated Medication List Outpatient Encounter Prescriptions as of 08/30/2012  Medication Sig Dispense Refill  . atorvastatin (LIPITOR) 20 MG tablet Take 20 mg by mouth Daily.       . metoprolol tartrate (LOPRESSOR) 25 MG tablet Take 25 mg by mouth 2 (two) times daily.      . ondansetron (ZOFRAN-ODT) 8 MG disintegrating tablet Take 1 tablet by mouth as needed.      . Rivaroxaban (XARELTO) 15 MG TABS tablet Take 15 mg by mouth daily with supper.

## 2012-08-30 NOTE — Assessment & Plan Note (Signed)
Hx of pulmonary embolism 07/2012.  Note current venous doppler neg for DVT.  Pt stable on xarelto. This pt may undergo knee replacement, she will need to stop xarelto prior to surgery and will need to resume same as soon as possible postop. No indication for IVC filter preop.

## 2012-08-30 NOTE — Patient Instructions (Addendum)
Stay on Xarelto Obtain a venous doppler ultrasound of both lower extremities today I will call you and Drs Edilia Bo the results to plan your knee surgery.   We may need to place an inferior vena caval filter preop.

## 2012-09-01 ENCOUNTER — Telehealth: Payer: Self-pay | Admitting: *Deleted

## 2012-09-01 NOTE — Telephone Encounter (Signed)
Pt aware Dr. Delford Field has sent a note to both Dr. Sol Passer and her surgeon cleating her for surgery from a pulmonary standpoint.

## 2012-09-01 NOTE — Telephone Encounter (Signed)
Spoke with pt regarding test results.  She would like to know if Dr. Delford Field has spoken with Dr. Sol Passer yet regarding pending surgery.  She is scheduled to have preop tomorrow at 1pm and was advised there will be $50 fee if appt not cancelled within 24 hours.  She would like to know so she can cx this if needed.  Dr. Delford Field, pls advise.  Thank you.

## 2012-09-01 NOTE — Progress Notes (Signed)
Quick Note:  Called, spoke with pt. Informed her DVT study was normal. She verbalized understanding. ______

## 2012-09-01 NOTE — Progress Notes (Signed)
Quick Note:  ATC pt's home # - line busy x 5. WCB ______

## 2012-09-01 NOTE — Telephone Encounter (Signed)
Pt called back.  Call her back @ [phone] [name] [name]

## 2012-09-01 NOTE — Telephone Encounter (Signed)
i sent a note to Dough and her surgeon She is cleared lung wise for surgery

## 2012-09-01 NOTE — Telephone Encounter (Signed)
Lm for pt tcb x1 

## 2012-09-02 ENCOUNTER — Other Ambulatory Visit (HOSPITAL_COMMUNITY): Payer: Medicare HMO

## 2012-09-05 ENCOUNTER — Encounter (HOSPITAL_COMMUNITY): Admission: RE | Admit: 2012-09-05 | Payer: Medicare HMO | Source: Ambulatory Visit

## 2012-09-09 ENCOUNTER — Inpatient Hospital Stay (HOSPITAL_COMMUNITY): Admission: RE | Admit: 2012-09-09 | Payer: Medicare HMO | Source: Ambulatory Visit | Admitting: Orthopedic Surgery

## 2012-09-09 ENCOUNTER — Encounter (HOSPITAL_COMMUNITY): Admission: RE | Payer: Self-pay | Source: Ambulatory Visit

## 2012-09-09 SURGERY — ARTHROPLASTY, KNEE, TOTAL
Anesthesia: General | Site: Knee | Laterality: Right

## 2012-09-12 NOTE — Telephone Encounter (Signed)
New problem:  C/o rapid heart rate

## 2012-09-12 NOTE — Telephone Encounter (Signed)
Pt called today because she said had 2 palpitations this morning these are the only two palpitations she had since she was seen on 08/19/12 no other symptoms. Pt is feeling fine no palpitations at this time. Pt  would like to be seen by Dr. [name] only. Pt has an appointment with Dr [name] on 11/16/12. Patient will call the office if palpitations occurred again.

## 2012-09-29 ENCOUNTER — Telehealth: Payer: Self-pay | Admitting: Critical Care Medicine

## 2012-09-29 NOTE — Telephone Encounter (Signed)
Three days.

## 2012-09-29 NOTE — Telephone Encounter (Signed)
LMTCB

## 2012-09-29 NOTE — Telephone Encounter (Signed)
Called, spoke with Darl Pikes with [facility].  Would like clarification on how many days prior to surgery does pt need to stop xarelto.  Dr. Delford Field, pls advise.  Thank you.

## 2012-09-29 NOTE — Telephone Encounter (Signed)
Darl Pikes called back and I informed her that patient needs to stop xarelto 3 days prior to surgery.  Nothing further needed.

## 2012-11-16 ENCOUNTER — Telehealth: Payer: Self-pay | Admitting: Internal Medicine

## 2012-11-16 ENCOUNTER — Ambulatory Visit: Payer: Medicare HMO | Admitting: Internal Medicine

## 2012-11-16 NOTE — Telephone Encounter (Signed)
I spoke with the patient. She states that she was released from [facility] yesterday after being hospitalized since Saturday with pneumonia. She has noticed for a couple of weeks that she has intermittent fast heart beats. She has noticed some dizziness with these episodes. The patient will come in on 11/23/12 to see Dr. [name]. She will call us prior to that should her symptoms worsen. She is agreeable.

## 2012-11-16 NOTE — Telephone Encounter (Signed)
Pt had appt today which her husband cxl because she was in the hospital, she ended up getting out yesterday and now has rapid heartbeat and wants to be seen, next opening 1-15 and I put her down, but she would like a nurse call

## 2012-11-23 ENCOUNTER — Encounter: Payer: Self-pay | Admitting: Internal Medicine

## 2012-11-23 ENCOUNTER — Ambulatory Visit (INDEPENDENT_AMBULATORY_CARE_PROVIDER_SITE_OTHER): Payer: Medicare HMO | Admitting: Internal Medicine

## 2012-11-23 VITALS — BP 142/80 | HR 68 | Ht 66.0 in | Wt 151.0 lb

## 2012-11-23 DIAGNOSIS — Z01810 Encounter for preprocedural cardiovascular examination: Secondary | ICD-10-CM | POA: Insufficient documentation

## 2012-11-23 DIAGNOSIS — I472 Ventricular tachycardia: Secondary | ICD-10-CM

## 2012-11-23 DIAGNOSIS — I2510 Atherosclerotic heart disease of native coronary artery without angina pectoris: Secondary | ICD-10-CM

## 2012-11-23 MED ORDER — NEBIVOLOL HCL 2.5 MG PO TABS: 2.5000 mg | ORAL_TABLET | Freq: Every day | ORAL | Status: DC

## 2012-11-23 NOTE — Assessment & Plan Note (Signed)
Thought to be ventricular tachycardia. Currently quite quiescent . We will Change her beta blocker from metoprolol to nebivolol in hopes that there'll be less CNS side effects flike fatigue

## 2012-11-23 NOTE — Assessment & Plan Note (Signed)
No chest pain. Catheterization 2011 was nonobstructive. There is a recent negative Myoview from [facility]. She's cleared for surgery. She has a history of postoperative DVT and this will need to be managed carefully

## 2012-11-23 NOTE — Progress Notes (Signed)
Patient Care Team: [name], MD as PCP - General (Unknown Physician Specialty) Storm Frisk, MD as Attending Physician (Pulmonary Disease)   HPI  [name] [name] is a 79 y.o. female Seen in followup for palpitations identified as PACs and nonsustained atrial arrhythmias. While in hospital in April she had significant symptoms associated with wide-complex tachycardia for which she saw Dr. [name]; catheter ablation was recommended as opposed amiodarone.  Because of insurance issues she came to see [place].  It was my impression on review it represented light ventricular tachycardia. She did not tolerate amiodarone. I gave her to her beta blockers at her last visit to see if she would do using low doses because of a baseline PR interval of 300-400 ms.  Myoview was normal; there were no perfusion defects not withstanding her prior history of myocardial infarction Catheterization 2011 [place] cardiology-no obstructive coronary disease  The beta blockers initially had a good impact on her heart rhythm. There is dizzy more problems recently but these have occurred in the context of a recent hospitalization for pneumonia as well as significant pain associated with the need for which she is seeking preoperative approval for replacement.she is also noted significant fatigue. When I asked her about the various beta blockers, statins metoprolol had been the most effective but was not sure what is the most likely to cause fatigue.  Functionally she is able to climb a flight of stairs without significant shortness of breath or chest pain.  Echocardiogram 2010- cardiology-no evidence of left ventricular hypertrophy Past Medical History  Diagnosis Date  . IBS (irritable bowel syndrome)   . Diverticulosis   . Colon polyps   . High cholesterol   . MI (myocardial infarction) 1975    Normal left ventricular function and nonischemic Myoview 2013  . GERD (gastroesophageal reflux disease)   .  Hypertension   . Renal stone   . Bronchiectasis, non-tuberculous   . DVT (deep venous thrombosis)     Resolved, no anticoagulation currently  . 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 Palpitation     PACs and PVCs  . Pulmonary fibrosis   . First degree AV block     Conduction system  . Wide-complex tachycardia     I presume this is VT  . Pulmonary embolism     Following knee surgery with a prior history    Past Surgical History  Procedure Date  . Total abdominal hysterectomy   . Appendectomy   . Bladder tack   . Cholecystectomy   . Colon surgery     12inches colectomy s/p diverticulitis  . Tonsillectomy     Current Outpatient Prescriptions  Medication Sig Dispense Refill  . atorvastatin (LIPITOR) 20 MG tablet Take 20 mg by mouth Daily.       . metoprolol tartrate (LOPRESSOR) 25 MG tablet Take 25 mg by mouth 2 (two) times daily.      . Rivaroxaban (XARELTO) 20 MG TABS Take 20 mg by mouth daily.        Allergies  Allergen Reactions  . Crestor (Rosuvastatin) Other (See Comments)    Myalgia  . Darvon     Drop in  BP    Review of Systems negative except from HPI and PMH  Physical Exam BP 142/80  Pulse 68  Ht 5\' 6"  (1.676 m)  Wt 151 lb (68.493 kg)  BMI 24.37 kg/m2  SpO2 96% Well developed and well nourished in no acute distress HENT normal E scleral and icterus clear  Neck Supple JVP flat; carotids brisk and full Clear to ausculation  Regular rate and rhythm, no murmurs gallops or rub Soft with active bowel sounds No clubbing cyanosis none Edema Alert and oriented, grossly normal motor and sensory function Skin Warm and Dry  ECG today demonstrates sinus rhythm at 68 Intervals 30/08/40 Low-voltage limb leads and in the precordium  Assessment and  Plan

## 2012-11-23 NOTE — Patient Instructions (Signed)
Your physician has recommended you make the following change in your medication:  1) Stop lopressor (metoprolol) 2) Start bystolic 2.5 mg one tablet by mouth daily.  Your physician wants you to follow-up in: 6 months with Dr. [name]. You will receive a reminder letter in the mail two months in advance. If you don't receive a letter, please call our office to schedule the follow-up appointment.

## 2012-11-23 NOTE — Assessment & Plan Note (Signed)
As above.

## 2012-11-28 ENCOUNTER — Encounter (HOSPITAL_COMMUNITY): Payer: Self-pay | Admitting: Pharmacy Technician

## 2012-11-30 ENCOUNTER — Other Ambulatory Visit: Payer: Self-pay | Admitting: Internal Medicine

## 2012-11-30 NOTE — Telephone Encounter (Signed)
Pt was told to stop her metoprolol. She stated she Dr [name] stopped it

## 2012-12-05 ENCOUNTER — Other Ambulatory Visit: Payer: Self-pay | Admitting: Orthopedic Surgery

## 2012-12-06 ENCOUNTER — Encounter (HOSPITAL_COMMUNITY)
Admission: RE | Admit: 2012-12-06 | Discharge: 2012-12-06 | Disposition: A | Discharge status: Home / Self Care | Payer: Medicare HMO | Source: Ambulatory Visit | Attending: Orthopedic Surgery | Admitting: Orthopedic Surgery

## 2012-12-06 ENCOUNTER — Encounter (HOSPITAL_COMMUNITY): Payer: Self-pay

## 2012-12-06 HISTORY — DX: Cerebral infarction, unspecified: I63.9

## 2012-12-06 HISTORY — DX: Chronic obstructive pulmonary disease, unspecified: J44.9

## 2012-12-06 LAB — URINALYSIS, ROUTINE W REFLEX MICROSCOPIC
Bilirubin Urine: NEGATIVE
Glucose, UA: NEGATIVE mg/dL
Hgb urine dipstick: NEGATIVE
Ketones, ur: NEGATIVE mg/dL
Leukocytes, UA: NEGATIVE
Protein, ur: NEGATIVE mg/dL
pH: 5.5 (ref 5.0–8.0)

## 2012-12-06 LAB — COMPREHENSIVE METABOLIC PANEL
AST: 22 U/L (ref 0–37)
BUN: 17 mg/dL (ref 6–23)
CO2: 25 mEq/L (ref 19–32)
Calcium: 9.8 mg/dL (ref 8.4–10.5)
Chloride: 104 mEq/L (ref 96–112)
Creatinine, Ser: 0.95 mg/dL (ref 0.50–1.10)
GFR calc Af Amer: 61 mL/min — ABNORMAL LOW (ref >90)
GFR calc non Af Amer: 53 mL/min — ABNORMAL LOW (ref >90)
Total Bilirubin: 0.5 mg/dL (ref 0.3–1.2)

## 2012-12-06 LAB — CBC WITH DIFFERENTIAL/PLATELET
Basophils Absolute: 0.1 10*3/uL (ref 0.0–0.1)
Eosinophils Relative: 3 % (ref 0–5)
HCT: 42.1 % (ref 36.0–46.0)
Hemoglobin: 13.9 g/dL (ref 12.0–15.0)
Lymphocytes Relative: 33 % (ref 12–46)
MCHC: 33 g/dL (ref 30.0–36.0)
MCV: 94.4 fL (ref 78.0–100.0)
Monocytes Absolute: 0.6 10*3/uL (ref 0.1–1.0)
Monocytes Relative: 9 % (ref 3–12)
Neutro Abs: 3.5 10*3/uL (ref 1.7–7.7)
RDW: 13.4 % (ref 11.5–15.5)
WBC: 6.4 10*3/uL (ref 4.0–10.5)

## 2012-12-06 LAB — PROTIME-INR
INR: 1.05 (ref 0.00–1.49)
Prothrombin Time: 13.6 seconds (ref 11.6–15.2)

## 2012-12-06 LAB — ABO/RH: ABO/RH(D): A POS

## 2012-12-06 LAB — TYPE AND SCREEN: ABO/RH(D): A POS

## 2012-12-06 NOTE — Pre-Procedure Instructions (Signed)
[name] [name]  12/06/2012   Your procedure is scheduled on:  12/12/12  Report to [facility] at 1030 AM.  Call this number if you have problems the morning of surgery: [phone]   Remember:   Do not eat food or drink liquids after midnight.   Take these medicines the morning of surgery with A SIP OF WATER: bystolic,xarelton   Do not wear jewelry, make-up or nail polish.  Do not wear lotions, powders, or perfumes. You may wear deodorant.  Do not shave 48 hours prior to surgery. Men may shave face and neck.  Do not bring valuables to the hospital.  Contacts, dentures or bridgework may not be worn into surgery.  Leave suitcase in the car. After surgery it may be brought to your room.  For patients admitted to the hospital, checkout time is 11:00 AM the day of  discharge.   Patients discharged the day of surgery will not be allowed to drive  home.  Name and phone number of your driver: family  Special Instructions: Shower using CHG 2 nights before surgery and the night before surgery.  If you shower the day of surgery use CHG.  Use special wash - you have one bottle of CHG for all showers.  You should use approximately 1/3 of the bottle for each shower.   Please read over the following fact sheets that you were given: Pain Booklet, Coughing and Deep Breathing, Blood Transfusion Information, MRSA Information and Surgical Site Infection Prevention

## 2012-12-06 NOTE — Progress Notes (Signed)
Pt is to see her PCP tomorrow and will discuss xarelton at that time. Also will give chart to [facility] for review althought pt states she is cleared by Dr [name]

## 2012-12-07 NOTE — Consult Note (Signed)
Anesthesia Chart Review: Patient is a 79 year old female scheduled for right TKA by Dr. Luiz Blare on 12/12/12.  History includes non-smoker, IBS, hypercholesterolemia, HTN, DVT > 40 years ago,  PE following right knee arthroscopy 06/2012, pulmonary fibrosis/MAC/brochiectasis, GERD, MI '75, palpitations associated with PACs and wide-complex tachycardia, CVA, colon resection for diverticulitis, kidney stones.  PCP is Dr. [name].    Pulmonologist is Dr. Delford Field.  He is aware of planned TKA and wrote, "Hx of pulmonary embolism 07/2012. Note current venous doppler neg for DVT. Pt stable on xarelto. This pt may undergo knee replacement, she will need to stop xarelto prior to surgery and will need to resume same as soon as possible postop. No indication for IVC filter preop."  (She reports she was told to hold Xarelto starting 12/09/12.)  EP Cardiologist is Dr. [name], last visit 11/23/12.  He has cleared her for this procedure from his standpoint.   (By notes, she has also been seen at [facility] in the past.)  Echo on 07/09/12 [facility]) showed normal global LV contractility, LVEF 60-65%, impaired relaxation, mild MR/TR, no evidence of pulmonary HTN, RV systolic pressure 38 mmHg, trivial pericardial effusion.  Nuclear stress test on 02/22/12 [facility]) showed no ischemia, normal gated imaging, normal EF 72%.   Cardiac cath on 04/29/10 at [facility] (under Media tab, Encounter date 07/08/12) showed mild non-obstructive CAD (30-40% proximal LAD, eccentric 70% proximal linear lesion in a moderate sized first diagonal branch, 30% proximal CX, 30-40% mid RCA, 60% proximal Ramus.  Normal LV function.  EKG on 11/23/12 showed SR with first degree AVB, LAD, low voltage QRS, cannot rule out anterior infarct (age undetermined).  CXR on 11/12/12 [facility]) showed: small focus of subseqmental atelectasis seen in the right mid lung which is significantly improved compared to  prior exam.  By notes, CTA of the chest on 07/09/12 showed acute bilateral PE with several non-occlusive emboli more concentrated in the right middle and right lower lobes (under Media tab).  PFTs on 04/13/12 showed FEV1 1.82 (84%).  Preoperative labs noted.  She has been cleared by both pulmonology and cardiology.  If no significant change in her status then anticipate she can proceed.  She will start DVT prophylaxis post-operatively.  Shonna Chock, PA-C 12/08/12 1219

## 2012-12-11 MED ORDER — CEFAZOLIN SODIUM-DEXTROSE 2-3 GM-% IV SOLR
2.0000 g | INTRAVENOUS | Status: AC
  Administered 2012-12-12: 2 g via INTRAVENOUS
  Filled 2012-12-11: qty 50

## 2012-12-12 ENCOUNTER — Inpatient Hospital Stay (HOSPITAL_COMMUNITY)
Admission: RE | Admit: 2012-12-12 | Discharge: 2012-12-17 | DRG: 470 | Disposition: A | Discharge status: Skilled Nursing Facility | Payer: Medicare HMO | Source: Ambulatory Visit | Attending: Orthopedic Surgery | Admitting: Orthopedic Surgery

## 2012-12-12 ENCOUNTER — Encounter (HOSPITAL_COMMUNITY): Payer: Self-pay | Admitting: Vascular Surgery

## 2012-12-12 ENCOUNTER — Inpatient Hospital Stay (HOSPITAL_COMMUNITY): Payer: Medicare HMO | Admitting: Vascular Surgery

## 2012-12-12 ENCOUNTER — Encounter (HOSPITAL_COMMUNITY)
Admission: RE | Disposition: A | Discharge status: Skilled Nursing Facility | Payer: Self-pay | Source: Ambulatory Visit | Attending: Orthopedic Surgery

## 2012-12-12 DIAGNOSIS — I2510 Atherosclerotic heart disease of native coronary artery without angina pectoris: Secondary | ICD-10-CM

## 2012-12-12 DIAGNOSIS — M1711 Unilateral primary osteoarthritis, right knee: Secondary | ICD-10-CM

## 2012-12-12 DIAGNOSIS — Z8673 Personal history of transient ischemic attack (TIA), and cerebral infarction without residual deficits: Secondary | ICD-10-CM

## 2012-12-12 DIAGNOSIS — Z86711 Personal history of pulmonary embolism: Secondary | ICD-10-CM

## 2012-12-12 DIAGNOSIS — I2699 Other pulmonary embolism without acute cor pulmonale: Secondary | ICD-10-CM

## 2012-12-12 DIAGNOSIS — I252 Old myocardial infarction: Secondary | ICD-10-CM

## 2012-12-12 DIAGNOSIS — Z01812 Encounter for preprocedural laboratory examination: Secondary | ICD-10-CM

## 2012-12-12 DIAGNOSIS — D62 Acute posthemorrhagic anemia: Secondary | ICD-10-CM | POA: Diagnosis not present

## 2012-12-12 DIAGNOSIS — R55 Syncope and collapse: Secondary | ICD-10-CM

## 2012-12-12 DIAGNOSIS — I10 Essential (primary) hypertension: Secondary | ICD-10-CM | POA: Diagnosis present

## 2012-12-12 DIAGNOSIS — I824Z9 Acute embolism and thrombosis of unspecified deep veins of unspecified distal lower extremity: Secondary | ICD-10-CM | POA: Clinically undetermined

## 2012-12-12 DIAGNOSIS — Z86718 Personal history of other venous thrombosis and embolism: Secondary | ICD-10-CM

## 2012-12-12 DIAGNOSIS — Z01810 Encounter for preprocedural cardiovascular examination: Secondary | ICD-10-CM

## 2012-12-12 DIAGNOSIS — I472 Ventricular tachycardia: Secondary | ICD-10-CM

## 2012-12-12 DIAGNOSIS — K219 Gastro-esophageal reflux disease without esophagitis: Secondary | ICD-10-CM | POA: Diagnosis present

## 2012-12-12 DIAGNOSIS — M1710 Unilateral primary osteoarthritis, unspecified knee: Principal | ICD-10-CM | POA: Diagnosis present

## 2012-12-12 HISTORY — DX: Unilateral primary osteoarthritis, right knee: M17.11

## 2012-12-12 HISTORY — PX: TOTAL KNEE ARTHROPLASTY: SHX125

## 2012-12-12 SURGERY — ARTHROPLASTY, KNEE, TOTAL
Anesthesia: General | Site: Knee | Laterality: Right | Wound class: Clean

## 2012-12-12 MED ORDER — SODIUM CHLORIDE 0.9 % IV SOLN
INTRAVENOUS | Status: DC
  Administered 2012-12-12 – 2012-12-13 (×2): via INTRAVENOUS
  Administered 2012-12-15: 1000 mL via INTRAVENOUS

## 2012-12-12 MED ORDER — LACTATED RINGERS IV SOLN
INTRAVENOUS | Status: DC | PRN
  Administered 2012-12-12 (×2): via INTRAVENOUS

## 2012-12-12 MED ORDER — MIDAZOLAM HCL 2 MG/2ML IJ SOLN
1.0000 mg | INTRAMUSCULAR | Status: DC | PRN
  Administered 2012-12-12: 1 mg via INTRAVENOUS

## 2012-12-12 MED ORDER — PROMETHAZINE HCL 25 MG/ML IJ SOLN
INTRAMUSCULAR | Status: AC
  Administered 2012-12-12: 6.25 mg via INTRAVENOUS
  Filled 2012-12-12: qty 1

## 2012-12-12 MED ORDER — ACETAMINOPHEN 10 MG/ML IV SOLN
INTRAVENOUS | Status: AC
  Filled 2012-12-12: qty 100

## 2012-12-12 MED ORDER — ALUM & MAG HYDROXIDE-SIMETH 200-200-20 MG/5ML PO SUSP: 30.0000 mL | ORAL | Status: DC | PRN

## 2012-12-12 MED ORDER — PROPOFOL 10 MG/ML IV BOLUS
INTRAVENOUS | Status: DC | PRN
  Administered 2012-12-12: 100 mg via INTRAVENOUS

## 2012-12-12 MED ORDER — HYDROMORPHONE HCL PF 1 MG/ML IJ SOLN
0.5000 mg | INTRAMUSCULAR | Status: DC | PRN
Start: 2012-12-12 — End: 2012-12-17
  Administered 2012-12-12 – 2012-12-13 (×2): 0.5 mg via INTRAVENOUS
  Administered 2012-12-14: 1 mg via INTRAVENOUS
  Filled 2012-12-12 (×5): qty 1

## 2012-12-12 MED ORDER — CEFUROXIME SODIUM 1.5 G IJ SOLR
INTRAMUSCULAR | Status: DC | PRN
  Administered 2012-12-12: 1.5 g

## 2012-12-12 MED ORDER — POVIDONE-IODINE 7.5 % EX SOLN
Freq: Once | CUTANEOUS | Status: DC
  Filled 2012-12-12: qty 118

## 2012-12-12 MED ORDER — POLYETHYLENE GLYCOL 3350 17 G PO PACK: 17.0000 g | PACK | Freq: Every day | ORAL | Status: DC | PRN

## 2012-12-12 MED ORDER — METHOCARBAMOL 500 MG PO TABS: 500.0000 mg | ORAL_TABLET | Freq: Four times a day (QID) | ORAL | Status: DC | PRN

## 2012-12-12 MED ORDER — DOCUSATE SODIUM 100 MG PO CAPS
100.0000 mg | ORAL_CAPSULE | Freq: Two times a day (BID) | ORAL | Status: DC
  Administered 2012-12-12 – 2012-12-17 (×10): 100 mg via ORAL
  Filled 2012-12-12 (×11): qty 1

## 2012-12-12 MED ORDER — LACTATED RINGERS IV SOLN: INTRAVENOUS | Status: DC

## 2012-12-12 MED ORDER — SODIUM CHLORIDE 0.9 % IR SOLN
Status: DC | PRN
  Administered 2012-12-12: 3000 mL

## 2012-12-12 MED ORDER — ACETAMINOPHEN 10 MG/ML IV SOLN
1000.0000 mg | Freq: Four times a day (QID) | INTRAVENOUS | Status: AC
  Administered 2012-12-13 – 2012-12-14 (×3): 1000 mg via INTRAVENOUS
  Filled 2012-12-12 (×4): qty 100

## 2012-12-12 MED ORDER — PROMETHAZINE HCL 25 MG/ML IJ SOLN
6.2500 mg | INTRAMUSCULAR | Status: DC | PRN
  Administered 2012-12-12: 6.25 mg via INTRAVENOUS

## 2012-12-12 MED ORDER — CEFUROXIME SODIUM 1.5 G IJ SOLR
INTRAMUSCULAR | Status: AC
  Filled 2012-12-12: qty 1.5

## 2012-12-12 MED ORDER — 0.9 % SODIUM CHLORIDE (POUR BTL) OPTIME
TOPICAL | Status: DC | PRN
  Administered 2012-12-12: 1000 mL

## 2012-12-12 MED ORDER — ACETAMINOPHEN 10 MG/ML IV SOLN
1000.0000 mg | Freq: Once | INTRAVENOUS | Status: DC
  Filled 2012-12-12: qty 100

## 2012-12-12 MED ORDER — ACETAMINOPHEN 10 MG/ML IV SOLN
INTRAVENOUS | Status: DC | PRN
  Administered 2012-12-12: 1000 mg via INTRAVENOUS

## 2012-12-12 MED ORDER — RIVAROXABAN 20 MG PO TABS
20.0000 mg | ORAL_TABLET | Freq: Every day | ORAL | Status: DC
  Administered 2012-12-13 – 2012-12-17 (×5): 20 mg via ORAL
  Filled 2012-12-12 (×6): qty 1

## 2012-12-12 MED ORDER — SUFENTANIL CITRATE 50 MCG/ML IV SOLN
INTRAVENOUS | Status: DC | PRN
  Administered 2012-12-12 (×2): 5 ug via INTRAVENOUS
  Administered 2012-12-12: 20 ug via INTRAVENOUS

## 2012-12-12 MED ORDER — NEBIVOLOL HCL 2.5 MG PO TABS
2.5000 mg | ORAL_TABLET | Freq: Every day | ORAL | Status: DC
  Administered 2012-12-12: 2.5 mg via ORAL
  Filled 2012-12-12 (×2): qty 1

## 2012-12-12 MED ORDER — FERROUS SULFATE 325 (65 FE) MG PO TABS
325.0000 mg | ORAL_TABLET | Freq: Two times a day (BID) | ORAL | Status: DC
  Administered 2012-12-13 – 2012-12-17 (×9): 325 mg via ORAL
  Filled 2012-12-12 (×12): qty 1

## 2012-12-12 MED ORDER — MIDAZOLAM HCL 2 MG/2ML IJ SOLN
INTRAMUSCULAR | Status: AC
  Filled 2012-12-12: qty 2

## 2012-12-12 MED ORDER — NEBIVOLOL HCL 2.5 MG PO TABS
2.5000 mg | ORAL_TABLET | Freq: Every day | ORAL | Status: DC
  Administered 2012-12-13 – 2012-12-17 (×5): 2.5 mg via ORAL
  Filled 2012-12-12 (×6): qty 1

## 2012-12-12 MED ORDER — CEFAZOLIN SODIUM-DEXTROSE 2-3 GM-% IV SOLR
2.0000 g | Freq: Four times a day (QID) | INTRAVENOUS | Status: AC
  Administered 2012-12-12 – 2012-12-13 (×2): 2 g via INTRAVENOUS
  Filled 2012-12-12 (×2): qty 50

## 2012-12-12 MED ORDER — OXYCODONE HCL 5 MG PO TABS: 5.0000 mg | ORAL_TABLET | Freq: Once | ORAL | Status: DC | PRN

## 2012-12-12 MED ORDER — METOCLOPRAMIDE HCL 5 MG/ML IJ SOLN
5.0000 mg | Freq: Three times a day (TID) | INTRAMUSCULAR | Status: DC | PRN
  Administered 2012-12-12: 10 mg via INTRAVENOUS
  Filled 2012-12-12: qty 2

## 2012-12-12 MED ORDER — HYDROCODONE-ACETAMINOPHEN 7.5-325 MG PO TABS
1.0000 | ORAL_TABLET | ORAL | Status: DC | PRN
  Administered 2012-12-13: 2 via ORAL
  Filled 2012-12-12 (×2): qty 2

## 2012-12-12 MED ORDER — DEXTROSE 5 % IV SOLN
500.0000 mg | Freq: Four times a day (QID) | INTRAVENOUS | Status: DC | PRN
  Filled 2012-12-12: qty 5

## 2012-12-12 MED ORDER — DEXAMETHASONE SODIUM PHOSPHATE 4 MG/ML IJ SOLN
INTRAMUSCULAR | Status: DC | PRN
  Administered 2012-12-12: 10 mg

## 2012-12-12 MED ORDER — GLYCOPYRROLATE 0.2 MG/ML IJ SOLN
INTRAMUSCULAR | Status: DC | PRN
  Administered 2012-12-12: 0.2 mg via INTRAVENOUS

## 2012-12-12 MED ORDER — FENTANYL CITRATE 0.05 MG/ML IJ SOLN
INTRAMUSCULAR | Status: AC
  Filled 2012-12-12: qty 2

## 2012-12-12 MED ORDER — HYDROMORPHONE HCL PF 1 MG/ML IJ SOLN
INTRAMUSCULAR | Status: AC
  Filled 2012-12-12: qty 1

## 2012-12-12 MED ORDER — DIPHENHYDRAMINE HCL 12.5 MG/5ML PO ELIX: 12.5000 mg | ORAL_SOLUTION | ORAL | Status: DC | PRN

## 2012-12-12 MED ORDER — ONDANSETRON HCL 4 MG/2ML IJ SOLN: 4.0000 mg | Freq: Four times a day (QID) | INTRAMUSCULAR | Status: DC | PRN

## 2012-12-12 MED ORDER — ROPIVACAINE HCL 5 MG/ML IJ SOLN
INTRAMUSCULAR | Status: DC | PRN
  Administered 2012-12-12: 125 mg via EPIDURAL

## 2012-12-12 MED ORDER — HYDROMORPHONE HCL PF 1 MG/ML IJ SOLN
0.2500 mg | INTRAMUSCULAR | Status: DC | PRN
  Administered 2012-12-12: 0.5 mg via INTRAVENOUS
  Administered 2012-12-12: 0.25 mg via INTRAVENOUS

## 2012-12-12 MED ORDER — METOCLOPRAMIDE HCL 10 MG PO TABS: 5.0000 mg | ORAL_TABLET | Freq: Three times a day (TID) | ORAL | Status: DC | PRN

## 2012-12-12 MED ORDER — OXYCODONE HCL 5 MG PO TABS
5.0000 mg | ORAL_TABLET | ORAL | Status: DC | PRN
  Administered 2012-12-13: 5 mg via ORAL
  Administered 2012-12-13 – 2012-12-15 (×4): 10 mg via ORAL
  Administered 2012-12-15: 5 mg via ORAL
  Filled 2012-12-12: qty 1
  Filled 2012-12-12 (×4): qty 2
  Filled 2012-12-12 (×2): qty 1

## 2012-12-12 MED ORDER — FENTANYL CITRATE 0.05 MG/ML IJ SOLN
50.0000 ug | INTRAMUSCULAR | Status: DC | PRN
  Administered 2012-12-12: 50 ug via INTRAVENOUS

## 2012-12-12 MED ORDER — ONDANSETRON HCL 4 MG PO TABS: 4.0000 mg | ORAL_TABLET | Freq: Four times a day (QID) | ORAL | Status: DC | PRN

## 2012-12-12 MED ORDER — ZOLPIDEM TARTRATE 5 MG PO TABS: 5.0000 mg | ORAL_TABLET | Freq: Every evening | ORAL | Status: DC | PRN

## 2012-12-12 MED ORDER — ATORVASTATIN CALCIUM 20 MG PO TABS
20.0000 mg | ORAL_TABLET | Freq: Every day | ORAL | Status: DC
  Administered 2012-12-13 – 2012-12-16 (×4): 20 mg via ORAL
  Filled 2012-12-12 (×6): qty 1

## 2012-12-12 MED ORDER — OXYCODONE HCL 5 MG/5ML PO SOLN: 5.0000 mg | Freq: Once | ORAL | Status: DC | PRN

## 2012-12-12 SURGICAL SUPPLY — 60 items
BANDAGE ESMARK 6X9 LF (GAUZE/BANDAGES/DRESSINGS) ×1 IMPLANT
BENZOIN TINCTURE PRP APPL 2/3 (GAUZE/BANDAGES/DRESSINGS) ×2 IMPLANT
BLADE SAGITTAL 25.0X1.19X90 (BLADE) ×2 IMPLANT
BLADE SAW SAG 90X13X1.27 (BLADE) ×2 IMPLANT
BNDG ESMARK 6X9 LF (GAUZE/BANDAGES/DRESSINGS) ×2
BOWL SMART MIX CTS (DISPOSABLE) ×2 IMPLANT
CEMENT HV SMART SET (Cement) ×4 IMPLANT
CLOTH BEACON ORANGE TIMEOUT ST (SAFETY) ×2 IMPLANT
CLSR STERI-STRIP ANTIMIC 1/2X4 (GAUZE/BANDAGES/DRESSINGS) ×2 IMPLANT
COVER BACK TABLE 24X17X13 BIG (DRAPES) IMPLANT
COVER SURGICAL LIGHT HANDLE (MISCELLANEOUS) ×2 IMPLANT
CUFF TOURNIQUET SINGLE 34IN LL (TOURNIQUET CUFF) ×2 IMPLANT
CUFF TOURNIQUET SINGLE 44IN (TOURNIQUET CUFF) IMPLANT
DRAPE EXTREMITY T 121X128X90 (DRAPE) ×2 IMPLANT
DRAPE U-SHAPE 47X51 STRL (DRAPES) ×2 IMPLANT
DRSG PAD ABDOMINAL 8X10 ST (GAUZE/BANDAGES/DRESSINGS) ×2 IMPLANT
DURAPREP 26ML APPLICATOR (WOUND CARE) ×2 IMPLANT
ELECT REM PT RETURN 9FT ADLT (ELECTROSURGICAL) ×2
ELECTRODE REM PT RTRN 9FT ADLT (ELECTROSURGICAL) ×1 IMPLANT
EVACUATOR 1/8 PVC DRAIN (DRAIN) ×2 IMPLANT
FACESHIELD LNG OPTICON STERILE (SAFETY) ×2 IMPLANT
GAUZE XEROFORM 5X9 LF (GAUZE/BANDAGES/DRESSINGS) ×2 IMPLANT
GLOVE BIOGEL PI IND STRL 8 (GLOVE) ×2 IMPLANT
GLOVE BIOGEL PI INDICATOR 8 (GLOVE) ×2
GLOVE ECLIPSE 7.5 STRL STRAW (GLOVE) ×4 IMPLANT
GOWN PREVENTION PLUS LG XLONG (DISPOSABLE) IMPLANT
GOWN STRL NON-REIN LRG LVL3 (GOWN DISPOSABLE) ×2 IMPLANT
GOWN STRL REIN XL XLG (GOWN DISPOSABLE) ×4 IMPLANT
HANDPIECE INTERPULSE COAX TIP (DISPOSABLE) ×1
HOOD PEEL AWAY FACE SHEILD DIS (HOOD) ×6 IMPLANT
IMMOBILIZER KNEE 20 (SOFTGOODS)
IMMOBILIZER KNEE 20 THIGH 36 (SOFTGOODS) IMPLANT
IMMOBILIZER KNEE 22 UNIV (SOFTGOODS) ×2 IMPLANT
IMMOBILIZER KNEE 24 THIGH 36 (MISCELLANEOUS) IMPLANT
IMMOBILIZER KNEE 24 UNIV (MISCELLANEOUS)
KIT BASIN OR (CUSTOM PROCEDURE TRAY) ×2 IMPLANT
KIT ROOM TURNOVER OR (KITS) ×2 IMPLANT
MANIFOLD NEPTUNE II (INSTRUMENTS) ×2 IMPLANT
NEEDLE HYPO 25GX1X1/2 BEV (NEEDLE) IMPLANT
NS IRRIG 1000ML POUR BTL (IV SOLUTION) ×2 IMPLANT
PACK TOTAL JOINT (CUSTOM PROCEDURE TRAY) ×2 IMPLANT
PAD ARMBOARD 7.5X6 YLW CONV (MISCELLANEOUS) ×4 IMPLANT
PAD CAST 4YDX4 CTTN HI CHSV (CAST SUPPLIES) ×1 IMPLANT
PADDING CAST COTTON 4X4 STRL (CAST SUPPLIES) ×1
SET HNDPC FAN SPRY TIP SCT (DISPOSABLE) ×1 IMPLANT
SPONGE GAUZE 4X4 12PLY (GAUZE/BANDAGES/DRESSINGS) ×2 IMPLANT
STAPLER VISISTAT 35W (STAPLE) IMPLANT
STRIP CLOSURE SKIN 1/2X4 (GAUZE/BANDAGES/DRESSINGS) ×2 IMPLANT
SUCTION FRAZIER TIP 10 FR DISP (SUCTIONS) ×2 IMPLANT
SUT MON AB 3-0 SH 27 (SUTURE)
SUT MON AB 3-0 SH27 (SUTURE) IMPLANT
SUT VIC AB 0 CTB1 27 (SUTURE) ×4 IMPLANT
SUT VIC AB 1 CT1 27 (SUTURE) ×2
SUT VIC AB 1 CT1 27XBRD ANBCTR (SUTURE) ×2 IMPLANT
SUT VIC AB 2-0 CTB1 (SUTURE) ×4 IMPLANT
SYR CONTROL 10ML LL (SYRINGE) IMPLANT
TOWEL OR 17X24 6PK STRL BLUE (TOWEL DISPOSABLE) ×2 IMPLANT
TOWEL OR 17X26 10 PK STRL BLUE (TOWEL DISPOSABLE) ×2 IMPLANT
TRAY FOLEY CATH 14FR (SET/KITS/TRAYS/PACK) ×2 IMPLANT
WATER STERILE IRR 1000ML POUR (IV SOLUTION) ×4 IMPLANT

## 2012-12-12 NOTE — Plan of Care (Signed)
Problem: Consults Goal: Diagnosis- Total Joint Replacement Primary Total Knee     

## 2012-12-12 NOTE — H&P (Signed)
TOTAL KNEE ADMISSION H&P  Patient is being admitted for right total knee arthroplasty.  Subjective:  Chief Complaint:right knee pain.  HPI: [name] [name], 79 y.o. female, has a history of pain and functional disability in the right knee due to arthritis and has failed non-surgical conservative treatments for greater than 12 weeks to includeNSAID's and/or analgesics, corticosteriod injections, viscosupplementation injections, flexibility and strengthening excercises and activity modification.  Onset of symptoms was gradual, starting 7 years ago with gradually worsening course since that time. The patient noted no past surgery on the right knee(s).  Patient currently rates pain in the right knee(s) at 7 out of 10 with activity. Patient has night pain, worsening of pain with activity and weight bearing, pain that interferes with activities of daily living, crepitus and joint swelling.  Patient has evidence of subchondral sclerosis and joint space narrowing by imaging studies. This patient has had failure of conservative care. There is no active infection.  Patient Active Problem List   Diagnosis Date Noted  . Preop cardiovascular exam 11/23/2012  . Pulmonary embolism 08/30/2012  . Fatigue 08/19/2012  . Wide-complex tachycardia 05/30/2012  . Bronchiectasis, non-tuberculous 01/06/2012  . 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 IBS (irritable bowel syndrome)   . Diverticulosis   . High cholesterol   . Coronary artery disease prior MI   . GERD (gastroesophageal reflux disease)   . Hypertension    Past Medical History  Diagnosis Date  . IBS (irritable bowel syndrome)   . Diverticulosis   . Colon polyps   . High cholesterol   . MI (myocardial infarction) 1975    Normal left ventricular function and nonischemic Myoview 2013  . GERD (gastroesophageal reflux disease)   . Hypertension   . Renal stone   . Bronchiectasis, non-tuberculous   . DVT (deep venous thrombosis)      Resolved, no anticoagulation currently  . 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 Palpitation     PACs and PVCs  . Pulmonary fibrosis   . First degree AV block     Conduction system  . Wide-complex tachycardia     I presume this is VT  . Pulmonary embolism     Following knee surgery with a prior history  . COPD (chronic obstructive pulmonary disease)     past hx with pneumonia  . Stroke     Past Surgical History  Procedure Date  . Total abdominal hysterectomy   . Appendectomy   . Bladder tack   . Cholecystectomy   . Colon surgery     12inches colectomy s/p diverticulitis  . Tonsillectomy     Prescriptions prior to admission  Medication Sig Dispense Refill  . atorvastatin (LIPITOR) 20 MG tablet Take 20 mg by mouth Daily.       . nebivolol (BYSTOLIC) 2.5 MG tablet Take 1 tablet (2.5 mg total) by mouth daily.  30 tablet  6  . Rivaroxaban (XARELTO) 20 MG TABS Take 20 mg by mouth daily.       Allergies  Allergen Reactions  . Crestor (Rosuvastatin) Other (See Comments)    Myalgia  . Darvon     Drop in  BP    History  Substance Use Topics  . Smoking status: Never Smoker   . Smokeless tobacco: Never Used  . Alcohol Use: No    Family History  Problem Relation Age of Onset  . Stomach cancer Brother   . Heart disease Father   . Heart attack Mother   .  Other Son     Triple Bypass     ROS  Objective:  Physical Exam  Vital signs in last 24 hours: Temp:  [95.7 F (35.4 C)] 95.7 F (35.4 C) (02/03 1102) Pulse Rate:  [59-66] 60  (02/03 1221) Resp:  [15-18] 15  (02/03 1221) BP: (162)/(84) 162/84 mmHg (02/03 1049) SpO2:  [96 %-100 %] 99 % (02/03 1221)  Labs:   Estimated Body mass index is 24.37 kg/(m^2) as calculated from the following:   Height as of 11/23/12: 5\' 6" (1.676 m).   Weight as of 11/23/12: 151 lb(68.493 kg).   Imaging Review Plain radiographs demonstrate severe degenerative joint disease of the right knee(s). The overall  alignment ismild varus. The bone quality appears to be fair for age and reported activity level.  Assessment/Plan:  End stage arthritis, right knee   The patient history, physical examination, clinical judgment of the provider and imaging studies are consistent with end stage degenerative joint disease of the right knee(s) and total knee arthroplasty is deemed medically necessary. The treatment options including medical management, injection therapy arthroscopy and arthroplasty were discussed at length. The risks and benefits of total knee arthroplasty were presented and reviewed. The risks due to aseptic loosening, infection, stiffness, patella tracking problems, thromboembolic complications and other imponderables were discussed. The patient acknowledged the explanation, agreed to proceed with the plan and consent was signed. Patient is being admitted for inpatient treatment for surgery, pain control, PT, OT, prophylactic antibiotics, VTE prophylaxis, progressive ambulation and ADL's and discharge planning. The patient is planning to be discharged home with home health services

## 2012-12-12 NOTE — Anesthesia Procedure Notes (Signed)
Anesthesia Regional Block:  Femoral nerve block  Pre-Anesthetic Checklist: ,, timeout performed, Correct Patient, Correct Site, Correct Laterality, Correct Procedure, Correct Position, site marked, Risks and benefits discussed,  Surgical consent,  Pre-op evaluation,  At surgeon's request and post-op pain management  Laterality: Right  Prep: chloraprep       Needles:  Injection technique: Single-shot  Needle Type: Echogenic Stimulator Needle     Needle Length: 5cm 5 cm Needle Gauge: 22 and 22 G    Additional Needles:  Procedures: ultrasound guided (picture in chart) and nerve stimulator Femoral nerve block  Nerve Stimulator or Paresthesia:  Response: quadraceps contraction, 0.45 mA,   Additional Responses:   Narrative:  Start time: 12/12/2012 12:08 PM End time: 12/12/2012 12:18 PM Injection made incrementally with aspirations every 5 mL.  Performed by: Personally  Anesthesiologist: Halford Decamp, MD  Additional Notes: Functioning IV was confirmed and monitors were applied.  A 50mm 22ga Arrow echogenic stimulator needle was used. Sterile prep and drape,hand hygiene and sterile gloves were used. Ultrasound guidance: relevant anatomy identified, needle position confirmed, local anesthetic spread visualized around nerve(s)., vascular puncture avoided.  Image printed for medical record. Negative aspiration and negative test dose prior to incremental administration of local anesthetic. The patient tolerated the procedure well.    Femoral nerve block

## 2012-12-12 NOTE — Anesthesia Postprocedure Evaluation (Signed)
Anesthesia Post Note  Patient: [name] [name]  Procedure(s) Performed: Procedure(s) (LRB): TOTAL KNEE ARTHROPLASTY (Right)  Anesthesia type: general  Patient location: PACU  Post pain: Pain level controlled  Post assessment: Patient's Cardiovascular Status Stable  Last Vitals:  Filed Vitals:   12/12/12 1630  BP: 131/48  Pulse: 61  Temp: 36.1 C  Resp: 12    Post vital signs: Reviewed and stable  Level of consciousness: sedated  Complications: No apparent anesthesia complications

## 2012-12-12 NOTE — Anesthesia Preprocedure Evaluation (Signed)
Anesthesia Evaluation  Patient identified by MRN, date of birth, ID band Patient awake    Reviewed: Allergy & Precautions, H&P , NPO status , Patient's Chart, lab work & pertinent test results  Airway Mallampati: II TM Distance: >3 FB Neck ROM: Full    Dental  (+) Edentulous Upper, Poor Dentition and Dental Advisory Given   Pulmonary COPD   Pulmonary exam normal       Cardiovascular hypertension, + CAD and + Past MI + dysrhythmias Supra Ventricular Tachycardia     Neuro/Psych CVA, No Residual Symptoms negative psych ROS   GI/Hepatic Neg liver ROS, GERD-  Medicated,  Endo/Other  negative endocrine ROS  Renal/GU negative Renal ROS     Musculoskeletal   Abdominal   Peds  Hematology   Anesthesia Other Findings   Reproductive/Obstetrics                           Anesthesia Physical Anesthesia Plan  ASA: III  Anesthesia Plan: General   Post-op Pain Management:    Induction: Intravenous  Airway Management Planned: LMA  Additional Equipment:   Intra-op Plan:   Post-operative Plan: Extubation in OR  Informed Consent: I have reviewed the patients History and Physical, chart, labs and discussed the procedure including the risks, benefits and alternatives for the proposed anesthesia with the patient or authorized representative who has indicated his/her understanding and acceptance.   Dental advisory given  Plan Discussed with: CRNA, Anesthesiologist and Surgeon  Anesthesia Plan Comments:         Anesthesia Quick Evaluation

## 2012-12-12 NOTE — Transfer of Care (Signed)
Immediate Anesthesia Transfer of Care Note  Patient: [name] [name]  Procedure(s) Performed: Procedure(s) (LRB) with comments: TOTAL KNEE ARTHROPLASTY (Right) - right total knee arthroplasty  Patient Location: PACU  Anesthesia Type:GA combined with regional for post-op pain  Level of Consciousness: awake  Airway & Oxygen Therapy: Patient Spontanous Breathing and Patient connected to nasal cannula oxygen  Post-op Assessment: Report given to PACU RN, Post -op Vital signs reviewed and stable and Patient moving all extremities  Post vital signs: Reviewed and stable  Complications: No apparent anesthesia complications

## 2012-12-12 NOTE — Brief Op Note (Signed)
12/12/2012  3:01 PM  PATIENT:  [name] [name]  79 y.o. female  PRE-OPERATIVE DIAGNOSIS:  degenerative joint disease right knee  POST-OPERATIVE DIAGNOSIS:  degenerative joint disease right knee  PROCEDURE:  Procedure(s) (LRB) with comments: TOTAL KNEE ARTHROPLASTY (Right) - right total knee arthroplasty  SURGEON:  Surgeon(s) and Role:    * Harvie Junior, MD - Primary  PHYSICIAN ASSISTANT:   ASSISTANTS: bethune   ANESTHESIA:   general  EBL:  Total I/O In: 1000 [I.V.:1000] Out: 200 [Urine:200]  BLOOD ADMINISTERED:none  DRAINS: (1) Hemovact drain(s) in the r knee with  Suction Open   LOCAL MEDICATIONS USED:  NONE  SPECIMEN:  No Specimen  DISPOSITION OF SPECIMEN:  N/A  COUNTS:  YES  TOURNIQUET:   Total Tourniquet Time Documented: Thigh (Right) - 54 minutes  DICTATION: .Other Dictation: Dictation Number [id number]  PLAN OF CARE: Admit to inpatient   PATIENT DISPOSITION:  PACU - hemodynamically stable.   Delay start of Pharmacological VTE agent (>24hrs) due to surgical blood loss or risk of bleeding: no

## 2012-12-12 NOTE — Preoperative (Signed)
Beta Blockers   Reason not to administer Beta Blockers:nebivolol given at 1123 hrs 12/12/12

## 2012-12-12 NOTE — Progress Notes (Signed)
Orthopedic Tech Progress Note Patient Details:  [name] [name] July 12, 1924 [id number]  CPM Right Knee CPM Right Knee: On Right Knee Flexion (Degrees): 60  Right Knee Extension (Degrees): 0  Additional Comments: trapeze bar   Cammer, Mickie Bail 12/12/2012, 4:33 PM

## 2012-12-13 LAB — BASIC METABOLIC PANEL
CO2: 18 mEq/L — ABNORMAL LOW (ref 19–32)
Calcium: 8.7 mg/dL (ref 8.4–10.5)
Chloride: 104 mEq/L (ref 96–112)
Glucose, Bld: 129 mg/dL — ABNORMAL HIGH (ref 70–99)
Potassium: 4.6 mEq/L (ref 3.5–5.1)
Sodium: 137 mEq/L (ref 135–145)

## 2012-12-13 LAB — CBC
HCT: 34 % — ABNORMAL LOW (ref 36.0–46.0)
Hemoglobin: 10.8 g/dL — ABNORMAL LOW (ref 12.0–15.0)
Hemoglobin: 11.7 g/dL — ABNORMAL LOW (ref 12.0–15.0)
MCH: 31.5 pg (ref 26.0–34.0)
MCH: 31.8 pg (ref 26.0–34.0)
MCV: 92.4 fL (ref 78.0–100.0)
RBC: 3.43 MIL/uL — ABNORMAL LOW (ref 3.87–5.11)
RBC: 3.68 MIL/uL — ABNORMAL LOW (ref 3.87–5.11)

## 2012-12-13 NOTE — Op Note (Signed)
NAMEDEIRDRA, [name] NO.:  [id number]  MEDICAL RECORD NO.:  [id number]  LOCATION:  [id number]                        FACILITY:  [facility]  PHYSICIAN:  Harvie Junior, M.D.   DATE OF BIRTH:  [birth year]  DATE OF PROCEDURE:  12/12/2012 DATE OF DISCHARGE:                              OPERATIVE REPORT   PREOPERATIVE DIAGNOSIS:  End-stage degenerative joint disease, right knee.  POSTOPERATIVE DIAGNOSIS:  End-stage degenerative joint disease, right knee.  PROCEDURE:  Right total knee replacement with Sigma system, size 2.5 femur, size 2.5 tibia, 10-mm bridging bearing, and a 35-mm all- polyethylene patella.  SURGEON:  Harvie Junior, M.D.  ASSISTANT:  Marshia Ly, PA  ANESTHESIA:  General.  BRIEF HISTORY:  [name] is an 79 year old female with long history of complaints of right knee pain.  We treated conservatively for long period of time with injection, therapy, activity modification, anti- inflammatory medication.  After failure of all conservative care and x- rays showing bone-on-bone changes, she is taken to the operating room for right total knee replacement.  DESCRIPTION OF PROCEDURE:  The patient was taken to the operating room, and after adequate anesthesia has been obtained with general anesthesia, the patient was placed supine on operating table.  The right leg was then prepped and draped in the usual sterile fashion.  Following this, the leg was exsanguinated, and blood pressure tourniquet was inflated to 325 mmHg.  Following this, a midline incision was made, subcutaneous tissue down to the level of the extensor mechanism.  Medial parapatellar arthrotomy was undertaken.  Once this was done, medial lateral meniscus retropatellar fat pad, synovium in the anterior aspect of the femur, and medial and lateral meniscus were removed.  At this time, attention was turned to the tibia was cut perpendicular to its long axis with an extramedullary guide.   Attention was then turned to the femur with an intramedullary guide cut with a 5-degree valgus alignment.  Once these cuts were made, a spacer block was put in place showing perfect neutral long alignment, and at this time, attention was turned to the femur sized to a 2.5.  Anterior and posterior cuts were made, chamfers and box.  Attention was turned to the tibia, sized to a 2.5 was drilled and keeled.  Trials were put in place with a 10-mm bridging bearing, and excellent stability and range of motion was achieved.  At this point, attention was turned to the patella, was cut down the level of 13 and 35 paddle was chosen and lugs were drilled for the paddle lugs were drilled in the femur.  All the trial components were then removed.  The knee was copiously and thoroughly lavaged with pulsatile lavage irrigation and suctioned dry.  The final components were then cemented into place size 2.5 femur, size 2.5 tibia, 10-mm bridging bearing trial was placed and a 35-mm all-polyethylene patella held with a clamp.  All excess bone cement was removed.  Cement was allowed to harden completely and once this is done, tourniquet was let down.  All bleeding was controlled with electrocautery.  The final poly was then placed.  The knee put through a range of motion to  test the stability and function and at that point, the medium Hemovac drain was placed.  The medial parapatellar arthrotomy was closed with 1 Vicryl running, skin with 0 and 2-0 Vicryl and 3-0 Monocryl subcuticular.  Benzoin and Steri-Strips were applied.  Sterile compressive dressing was applied.  The patient was taken to the recovery room.  She was noted to be in satisfactory condition.  Estimated blood loss for procedure was less than 50 mL.     Harvie Junior, M.D.     Ranae Plumber  D:  12/12/2012  T:  12/13/2012  Job:  [id number]

## 2012-12-13 NOTE — Progress Notes (Signed)
UR COMPLETED  

## 2012-12-13 NOTE — Progress Notes (Signed)
Subjective: 1 Day Post-Op Procedure(s) (LRB): TOTAL KNEE ARTHROPLASTY (Right) Patient reports pain as 2 on 0-10 scale.   Not OOB yet. Taking po ok. No CP or SOB.  Objective: Vital signs in last 24 hours: Temp:  [95.7 F (35.4 C)-98.6 F (37 C)] 98.1 F (36.7 C) (02/04 0559) Pulse Rate:  [58-67] 67  (02/04 0559) Resp:  [8-20] 16  (02/04 0559) BP: (98-162)/(45-84) 138/58 mmHg (02/04 0559) SpO2:  [96 %-100 %] 98 % (02/04 0559)  Intake/Output from previous day: 02/03 0701 - 02/04 0700 In: 2200 [I.V.:2200] Out: 1275 [Urine:700; Drains:575] Intake/Output this shift:     Basename 12/13/12 0750 12/13/12 0440  HGB 11.7* 10.8*    Basename 12/13/12 0750 12/13/12 0440  WBC 11.9* 2.4*  RBC 3.68* 3.43*  HCT 34.0* 31.8*  PLT 181 8*    Basename 12/13/12 0440  NA 137  K 4.6  CL 104  CO2 18*  BUN 17  CREATININE 0.77  GLUCOSE 129*  CALCIUM 8.7   Right knee: Neurovascular intact Sensation intact distally Intact pulses distally Dorsiflexion/Plantar flexion intact Compartment soft  Assessment/Plan: 1 Day Post-Op Procedure(s) (LRB): TOTAL KNEE ARTHROPLASTY (Right) Plan: Xarelto for VTE prophylaxis. She took pre op. Up with therapy Discharge home with home health in 1-2 days. D/C foley.  Saloma Cadena G 12/13/2012, 9:10 AM

## 2012-12-13 NOTE — Evaluation (Signed)
Physical Therapy Evaluation Patient Details Name: [name] [name] MRN: [id number] DOB: [birth year] Today's Date: 12/13/2012 Time: 4098-1191 PT Time Calculation (min): 28 min  PT Assessment / Plan / Recommendation Clinical Impression  pt rpesents with R TKA.  pt moving well, but limited by feeling lightheaded.  Anticipate good progress.      PT Assessment  Patient needs continued PT services    Follow Up Recommendations  Home health PT;Supervision/Assistance - 24 hour    Does the patient have the potential to tolerate intense rehabilitation      Barriers to Discharge None      Equipment Recommendations  None recommended by PT    Recommendations for Other Services OT consult   Frequency 7X/week    Precautions / Restrictions Precautions Precautions: Fall;Knee Precaution Booklet Issued: Yes (comment) Required Braces or Orthoses: Knee Immobilizer - Right Knee Immobilizer - Right: Discontinue once straight leg raise with < 10 degree lag Restrictions Weight Bearing Restrictions: Yes RLE Weight Bearing: Weight bearing as tolerated   Pertinent Vitals/Pain Indicates minimal pain.        Mobility  Bed Mobility Bed Mobility: Supine to Sit;Sitting - Scoot to Edge of Bed Supine to Sit: 4: Min assist;With rails Sitting - Scoot to [place] of Bed: 4: Min assist Details for Bed Mobility Assistance: A with R LE only.   Transfers Transfers: Sit to Stand;Stand to Sit Sit to Stand: 4: Min assist;With upper extremity assist;From bed Stand to Sit: 4: Min guard;With upper extremity assist;To chair/3-in-1;With armrests Details for Transfer Assistance: cues for UE use, positioning of LEs, controlling descent to chair.   Ambulation/Gait Ambulation/Gait Assistance: 4: Min guard Ambulation Distance (Feet): 10 Feet Assistive device: Rolling walker Ambulation/Gait Assistance Details: cues for gait sequencing, positioning in RW, upright posture.  pt notes beginning to feel lightheaded and flush,  needed to sit quickly.   Gait Pattern: Step-to pattern;Decreased step length - left;Decreased stance time - right;Decreased stride length;Trunk flexed Stairs: No Wheelchair Mobility Wheelchair Mobility: No    Exercises Total Joint Exercises Ankle Circles/Pumps: AROM;Both;10 reps Quad Sets: AROM;Both;10 reps Long Arc Quad: AAROM;Right;10 reps Knee Flexion: AAROM;Right;10 reps   PT Diagnosis: Abnormality of gait  PT Problem List: Decreased strength;Decreased range of motion;Decreased activity tolerance;Decreased balance;Decreased mobility;Decreased knowledge of use of DME PT Treatment Interventions: DME instruction;Gait training;Stair training;Functional mobility training;Therapeutic activities;Therapeutic exercise;Balance training;Patient/family education   PT Goals Acute Rehab PT Goals PT Goal Formulation: With patient Time For Goal Achievement: 12/20/12 Potential to Achieve Goals: Good Pt will go Supine/Side to Sit: with modified independence PT Goal: Supine/Side to Sit - Progress: Goal set today Pt will go Sit to Supine/Side: with modified independence PT Goal: Sit to Supine/Side - Progress: Goal set today Pt will go Sit to Stand: with modified independence PT Goal: Sit to Stand - Progress: Goal set today Pt will go Stand to Sit: with modified independence PT Goal: Stand to Sit - Progress: Goal set today Pt will Ambulate: >150 feet;with modified independence;with rolling walker PT Goal: Ambulate - Progress: Goal set today Pt will Go Up / Down Stairs: 1-2 stairs;with min assist;with least restrictive assistive device PT Goal: Up/Down Stairs - Progress: Goal set today Pt will Perform Home Exercise Program: with supervision, verbal cues required/provided PT Goal: Perform Home Exercise Program - Progress: Goal set today  Visit Information  Last PT Received On: 12/13/12 Assistance Needed: +1    Subjective Data  Subjective: I try to exercise most days of the week.   Patient  Stated Goal: Volunteering  Prior Functioning  Home Living Lives With: Spouse Available Help at Discharge: Family;Available 24 hours/day Type of Home: House Home Access: Stairs to enter [employer] of Steps: 2 Entrance Stairs-Rails: Left Home Layout: One level Bathroom Shower/Tub: Health visitor: Standard Home Adaptive Equipment: Bedside commode/3-in-1;Walker - rolling Prior Function Level of Independence: Independent Able to Take Stairs?: Yes Driving: Yes Vocation: Agricultural consultant work Musician: No difficulties    Copywriter, advertising Overall Cognitive Status: Appears within functional limits for tasks assessed/performed Arousal/Alertness: Awake/alert Orientation Level: Appears intact for tasks assessed Behavior During Session: [facility] for tasks performed    Extremity/Trunk Assessment Right Lower Extremity Assessment RLE ROM/Strength/Tone: Deficits RLE ROM/Strength/Tone Deficits: Post-op weakness, AAROM ~10 - 80 RLE Sensation: WFL - Light Touch Left Lower Extremity Assessment LLE ROM/Strength/Tone: WFL for tasks assessed LLE Sensation: WFL - Light Touch Trunk Assessment Trunk Assessment: Normal   Balance Balance Balance Assessed: No  End of Session PT - End of Session Equipment Utilized During Treatment: Gait belt;Right knee immobilizer Activity Tolerance: Patient tolerated treatment well Patient left: in chair;with call bell/phone within reach Nurse Communication: Mobility status CPM Right Knee CPM Right Knee: Off  GP     Sunny Schlein, [place] [phone] 12/13/2012, 10:43 AM

## 2012-12-13 NOTE — Progress Notes (Signed)
Physical Therapy Treatment Patient Details Name: [name] [name] MRN: [id number] DOB: 1924/05/31 Today's Date: 12/13/2012 Time: 4098-1191 PT Time Calculation (min): 25 min  PT Assessment / Plan / Recommendation Comments on Treatment Session  pt presents with R TKA.  pt again limited by lightheadedness, but requiring very little A for mobilty.      Follow Up Recommendations  Home health PT;Supervision/Assistance - 24 hour     Does the patient have the potential to tolerate intense rehabilitation     Barriers to Discharge None      Equipment Recommendations  None recommended by PT    Recommendations for Other Services OT consult  Frequency 7X/week   Plan Discharge plan remains appropriate;Frequency remains appropriate    Precautions / Restrictions Precautions Precautions: Fall;Knee Precaution Booklet Issued: Yes (comment) Required Braces or Orthoses: Knee Immobilizer - Right Knee Immobilizer - Right: Discontinue once straight leg raise with < 10 degree lag Restrictions Weight Bearing Restrictions: Yes RLE Weight Bearing: Weight bearing as tolerated   Pertinent Vitals/Pain Denies pain.      Mobility  Bed Mobility Bed Mobility: Not assessed Supine to Sit: 4: Min assist;With rails Sitting - Scoot to Edge of Bed: 4: Min assist Details for Bed Mobility Assistance: A with R LE only.   Transfers Transfers: Sit to Stand;Stand to Sit Sit to Stand: 4: Min guard;With upper extremity assist;From chair/3-in-1;With armrests Stand to Sit: 4: Min guard;With upper extremity assist;To chair/3-in-1;With armrests Details for Transfer Assistance: cues for UE use, controlling descent to chair.   Ambulation/Gait Ambulation/Gait Assistance: 4: Min guard Ambulation Distance (Feet): 16 Feet Assistive device: Rolling walker Ambulation/Gait Assistance Details: cues for positioning in RW, gait sequencing, upright posture.  pt again notes feeling flush and lightheaded during ambulating and needed to  sit down.   Gait Pattern: Step-to pattern;Decreased step length - left;Decreased stance time - right;Decreased stride length;Trunk flexed Stairs: No Wheelchair Mobility Wheelchair Mobility: No    Exercises Total Joint Exercises Ankle Circles/Pumps: AROM;Both;10 reps Quad Sets: AROM;Both;10 reps Long Arc Quad: AAROM;Right;10 reps Knee Flexion: AAROM;Right;10 reps   PT Diagnosis: Abnormality of gait  PT Problem List: Decreased strength;Decreased range of motion;Decreased activity tolerance;Decreased balance;Decreased mobility;Decreased knowledge of use of DME PT Treatment Interventions: DME instruction;Gait training;Stair training;Functional mobility training;Therapeutic activities;Therapeutic exercise;Balance training;Patient/family education   PT Goals Acute Rehab PT Goals PT Goal Formulation: With patient Time For Goal Achievement: 12/20/12 Potential to Achieve Goals: Good Pt will go Supine/Side to Sit: with modified independence PT Goal: Supine/Side to Sit - Progress: Goal set today Pt will go Sit to Supine/Side: with modified independence PT Goal: Sit to Supine/Side - Progress: Goal set today Pt will go Sit to Stand: with modified independence PT Goal: Sit to Stand - Progress: Progressing toward goal Pt will go Stand to Sit: with modified independence PT Goal: Stand to Sit - Progress: Progressing toward goal Pt will Ambulate: >150 feet;with modified independence;with rolling walker PT Goal: Ambulate - Progress: Progressing toward goal Pt will Go Up / Down Stairs: 1-2 stairs;with min assist;with least restrictive assistive device PT Goal: Up/Down Stairs - Progress: Goal set today Pt will Perform Home Exercise Program: with supervision, verbal cues required/provided PT Goal: Perform Home Exercise Program - Progress: Goal set today  Visit Information  Last PT Received On: 12/13/12 Assistance Needed: +1    Subjective Data  Subjective: I just don't do well with medicine.    Patient Stated Goal: Volunteering   Cognition  Cognition Overall Cognitive Status: Appears within functional limits for  tasks assessed/performed Arousal/Alertness: Awake/alert Orientation Level: Appears intact for tasks assessed Behavior During Session: [facility] for tasks performed    Balance  Balance Balance Assessed: No  End of Session PT - End of Session Equipment Utilized During Treatment: Gait belt;Right knee immobilizer Activity Tolerance: Patient tolerated treatment well Patient left: in chair;with call bell/phone within reach;with family/visitor present Nurse Communication: Mobility status CPM Right Knee CPM Right Knee: Off   GP     Sunny Schlein, [place] [phone] 12/13/2012, 11:47 AM

## 2012-12-13 NOTE — Care Management Note (Signed)
    Page 1 of 1   12/13/2012     1:55:33 PM   CARE MANAGEMENT NOTE 12/13/2012  Patient:  [name] [name], [name] [name]   Account Number:  [id number]  Date Initiated:  12/13/2012  Documentation initiated by:  Anette Guarneri  Subjective/Objective Assessment:   Right TKA  plans d/c home  [facility] services pre-arranged     Action/Plan:   Home with [facility] services  Has DME   Anticipated DC Date:     Anticipated DC Plan:  HOME W HOME HEALTH SERVICES      DC Planning Services  CM consult      Choice offered to / List presented to:             Status of service:  In process, will continue to follow Medicare Important Message given?   (If response is "NO", the following Medicare IM given date fields will be blank) Date Medicare IM given:   Date Additional Medicare IM given:    Discharge Disposition:    Per UR Regulation:  Reviewed for med. necessity/level of care/duration of stay  If discussed at Long Length of Stay Meetings, dates discussed:    Comments:  12/13/12 13:48 Anette Guarneri RN/CM MD office pre-arranged [facility] services with [facility] Patient has RW and 3n1 Patient is on Xarelto 20mg  qd, [facility] [place], called [facility] and confirmed that they will have xarelto available when patient d/cd home.

## 2012-12-14 ENCOUNTER — Encounter (HOSPITAL_COMMUNITY): Payer: Self-pay | Admitting: Orthopedic Surgery

## 2012-12-14 DIAGNOSIS — D62 Acute posthemorrhagic anemia: Secondary | ICD-10-CM | POA: Diagnosis not present

## 2012-12-14 LAB — BASIC METABOLIC PANEL
BUN: 20 mg/dL (ref 6–23)
Creatinine, Ser: 0.96 mg/dL (ref 0.50–1.10)
GFR calc Af Amer: 60 mL/min — ABNORMAL LOW (ref >90)
GFR calc non Af Amer: 52 mL/min — ABNORMAL LOW (ref >90)
Glucose, Bld: 121 mg/dL — ABNORMAL HIGH (ref 70–99)

## 2012-12-14 LAB — CBC
HCT: 26.6 % — ABNORMAL LOW (ref 36.0–46.0)
MCH: 30.9 pg (ref 26.0–34.0)
MCHC: 33.5 g/dL (ref 30.0–36.0)
MCV: 92.4 fL (ref 78.0–100.0)
RDW: 13.4 % (ref 11.5–15.5)

## 2012-12-14 NOTE — Progress Notes (Signed)
Subjective: 2 Days Post-Op Procedure(s) (LRB): TOTAL KNEE ARTHROPLASTY (Right) Patient reports pain as 5 on 0-10 scale.   No CP or SOB. Mild dizziness when up with PT. Taking po/voiding ok.  Objective: Vital signs in last 24 hours: Temp:  [97.6 F (36.4 C)-98.8 F (37.1 C)] 97.9 F (36.6 C) (02/05 0630) Pulse Rate:  [70-74] 74  (02/05 0630) Resp:  [18] 18  (02/05 0630) BP: (126-135)/(77-82) 135/82 mmHg (02/05 0630) SpO2:  [98 %-99 %] 99 % (02/05 0630)  Intake/Output from previous day: 02/04 0701 - 02/05 0700 In: 270 [P.O.:270] Out: 575 [Urine:300; Drains:275] Intake/Output this shift:     Basename 12/14/12 0525 12/13/12 0750 12/13/12 0440  HGB 8.9* 11.7* 10.8*    Basename 12/14/12 0525 12/13/12 0750  WBC 9.3 11.9*  RBC 2.88* 3.68*  HCT 26.6* 34.0*  PLT 163 181    Basename 12/14/12 0525 12/13/12 0440  NA 139 137  K 3.9 4.6  CL 107 104  CO2 27 18*  BUN 20 17  CREATININE 0.96 0.77  GLUCOSE 121* 129*  CALCIUM 8.4 8.7   Right knee: Neurovascular intact Sensation intact distally Intact pulses distally Dorsiflexion/Plantar flexion intact Incision: no drainage Compartment soft  Assessment/Plan: 2 Days Post-Op Procedure(s) (LRB): TOTAL KNEE ARTHROPLASTY (Right) Acute Blood Loss Anemia Plan: Up with therapy Plan for discharge tomorrow Discharge home with home health Dressing changed/drain pulled. Cont Oral Iron   Check CBC in Am.  Jamonica Schoff G 12/14/2012, 11:56 AM

## 2012-12-14 NOTE — Progress Notes (Signed)
Physical Therapy Treatment Patient Details Name: [name] [name] MRN: [id number] DOB: 1923/11/18 Today's Date: 12/14/2012 Time: 4098-1191 PT Time Calculation (min): 25 min  PT Assessment / Plan / Recommendation Comments on Treatment Session  pt rpesents with R TKA.  pt again limited by lightheadedness and today nausea.  pt encouraged to eat today and given cheerios and crackers.      Follow Up Recommendations  Home health PT;Supervision/Assistance - 24 hour     Does the patient have the potential to tolerate intense rehabilitation     Barriers to Discharge        Equipment Recommendations  None recommended by PT    Recommendations for Other Services    Frequency 7X/week   Plan Discharge plan remains appropriate;Frequency remains appropriate    Precautions / Restrictions Precautions Precautions: Fall;Knee Required Braces or Orthoses: Knee Immobilizer - Right Knee Immobilizer - Right: Discontinue once straight leg raise with < 10 degree lag Restrictions Weight Bearing Restrictions: Yes RLE Weight Bearing: Weight bearing as tolerated   Pertinent Vitals/Pain Indicates pain is moderate today, but notes nausea.      Mobility  Bed Mobility Bed Mobility: Not assessed Transfers Transfers: Sit to Stand;Stand to Sit Sit to Stand: 4: Min assist;With upper extremity assist;From chair/3-in-1;With armrests Stand to Sit: 4: Min guard;With upper extremity assist;To chair/3-in-1;With armrests Details for Transfer Assistance: cues for UE use, controlling descent to chair.    Ambulation/Gait Ambulation/Gait Assistance: 4: Min guard Ambulation Distance (Feet): 10 Feet Assistive device: Rolling walker Ambulation/Gait Assistance Details: cues for positioning in RW, gait sequencing, upright posture.  pt again began to feel lightheaded and nauseated.  Returned to sitting quickly.  pt then notes she has not eaten anything today except a glass of juice.   Gait Pattern: Step-to pattern;Decreased  step length - left;Decreased stance time - right;Decreased stride length;Trunk flexed Stairs: No Wheelchair Mobility Wheelchair Mobility: No    Exercises     PT Diagnosis:    PT Problem List:   PT Treatment Interventions:     PT Goals Acute Rehab PT Goals Time For Goal Achievement: 12/20/12 Potential to Achieve Goals: Good PT Goal: Sit to Stand - Progress: Progressing toward goal PT Goal: Stand to Sit - Progress: Progressing toward goal PT Goal: Ambulate - Progress: Progressing toward goal  Visit Information  Last PT Received On: 12/14/12 Assistance Needed: +2 (for chair follow/safety)    Subjective Data  Subjective: I just feel so rotten.     Cognition  Cognition Overall Cognitive Status: Appears within functional limits for tasks assessed/performed Arousal/Alertness: Awake/alert Orientation Level: Appears intact for tasks assessed Behavior During Session: [facility] for tasks performed    Balance  Balance Balance Assessed: No  End of Session PT - End of Session Equipment Utilized During Treatment: Gait belt;Right knee immobilizer Activity Tolerance: Patient tolerated treatment well Patient left: in chair;with call bell/phone within reach Nurse Communication: Mobility status   GP     Sunny Schlein,  [phone] 12/14/2012, 11:42 AM

## 2012-12-14 NOTE — Progress Notes (Signed)
PT Cancellation Note  Patient Details Name: [name] [name] MRN: [id number] DOB: [birth year]   Cancelled Treatment:    Reason Eval/Treat Not Completed: Medical issues which prohibited therapy.  Pt indicating feeling lightheaded while in recliner.  States she almost passed out with Nsg when trying to use 3-in-1 not long before PT arrived.  Will f/u tomorrow.     Sunny Schlein, [place] [phone] 12/14/2012, 2:26 PM

## 2012-12-15 DIAGNOSIS — R55 Syncope and collapse: Secondary | ICD-10-CM

## 2012-12-15 DIAGNOSIS — I2699 Other pulmonary embolism without acute cor pulmonale: Secondary | ICD-10-CM

## 2012-12-15 DIAGNOSIS — M1710 Unilateral primary osteoarthritis, unspecified knee: Secondary | ICD-10-CM

## 2012-12-15 DIAGNOSIS — IMO0002 Reserved for concepts with insufficient information to code with codable children: Secondary | ICD-10-CM

## 2012-12-15 LAB — BASIC METABOLIC PANEL
BUN: 9 mg/dL (ref 6–23)
Chloride: 104 mEq/L (ref 96–112)
Creatinine, Ser: 0.79 mg/dL (ref 0.50–1.10)
GFR calc Af Amer: 85 mL/min — ABNORMAL LOW (ref >90)
Glucose, Bld: 111 mg/dL — ABNORMAL HIGH (ref 70–99)

## 2012-12-15 LAB — CBC
HCT: 30.9 % — ABNORMAL LOW (ref 36.0–46.0)
Hemoglobin: 10.2 g/dL — ABNORMAL LOW (ref 12.0–15.0)
MCHC: 33 g/dL (ref 30.0–36.0)
MCV: 93.9 fL (ref 78.0–100.0)
RDW: 13.4 % (ref 11.5–15.5)

## 2012-12-15 MED ORDER — OXYCODONE-ACETAMINOPHEN 5-325 MG PO TABS: 1.0000 | ORAL_TABLET | Freq: Four times a day (QID) | ORAL | Status: DC | PRN

## 2012-12-15 MED ORDER — METHOCARBAMOL 500 MG PO TABS: 500.0000 mg | ORAL_TABLET | Freq: Two times a day (BID) | ORAL | Status: DC | PRN

## 2012-12-15 NOTE — Progress Notes (Addendum)
*  Preliminary Results* Bilateral lower extremity venous duplex completed. The right lower extremity is positive for deep vein thrombosis involving the right posterior tibial veins. There is no evidence of a left Baker's cyst. Unable to visualize the right popliteal vein due to surgical dressings and patient position, therefore unable to exclude deep vein thrombosis in this vein. Preliminary results discussed with Chari Manning, RN.  12/15/2012 3:28 PM Gertie Fey, RDMS, RDCS

## 2012-12-15 NOTE — Progress Notes (Signed)
Pt had a syncopal episode when returning from bathroom with RN and nursing student. Pt slumped and held in both RN and nursing students arms. Help was called and Pt was returned to bed. Rapid response was called and vital signs were taken: BP 151/63, HR 70s-130s, RR 20, Temp , CBG 90. Gus Puma, PA was called and 12-lead EKG was ordered. PA stated that he would contact her Cardiologist Dr. Tollie Pizza and consult him to see pt today. Pt currently stable. Will continue to monitor.

## 2012-12-15 NOTE — Clinical Social Work Psychosocial (Addendum)
Clinical Social Work Department  BRIEF PSYCHOSOCIAL ASSESSMENT  Patient: [name] [name] Account Number:  [id number] Admit date: 12/12/2012 ocial Worker Sabino Niemann, MSW Date/Time: 12/15/2012 2:00 PM Referred by: Physician Date Referred: 12/15/2012 Referred for   SNF Placement   Other Referral:  Interview type: Patient  Other interview type: PSYCHOSOCIAL DATA  Living Status: Husband Admitted from facility:  Level of care:  Primary support name: [name],[name]. Primary support relationship to patient:  husband Degree of support available:  Strong and vested  CURRENT CONCERNS  Current Concerns   Post-Acute Placement   Other Concerns:  SOCIAL WORK ASSESSMENT / PLAN  CSW met with pt re: PT recommendation for SNF.   Pt lives with her husband   CSW explained placement process and answered questions.   Pt reports no preference- pat wants to be faxed out in [facility]  CSW completed FL2 and initiatedSNF search.     Assessment/plan status: Information/Referral to [facility]  Other assessment/ plan:  Information/referral to community resources:  SNF   PTAR   PATIENT'S/FAMILY'S RESPONSE TO PLAN OF CARE:  Pt  reports she is agreeable to ST SNF in order to increase strength and independence with mobility prior to return home  Pt verbalized understanding of placement process and appreciation for CSW assist.   Sabino Niemann, MSW [phone]

## 2012-12-15 NOTE — Progress Notes (Signed)
Agree with SPTA.    Ieasha Boerema, PT [phone]  

## 2012-12-15 NOTE — Progress Notes (Signed)
Physical Therapy Treatment Patient Details Name: [name] [name] MRN: [id number] DOB: 1923-12-25 Today's Date: 12/15/2012 Time: 0102-7253 PT Time Calculation (min): 34 min  PT Assessment / Plan / Recommendation Comments on Treatment Session  Upon entering pt was on the phone with daughter in law who suggest D/C to rehab versus going home. Pt now feels like she is not able to go home because her husband cannot provide much assistance. We discussed benefits of SNF although pt stated she perfers to stay here for rehab. SNF is recommended at this time due to decreased mobility frequent episodes of dizziness and lightheadedness. Updated D/C plans .    Follow Up Recommendations  SNF;Supervision/Assistance - 24 hour     Does the patient have the potential to tolerate intense rehabilitation     Barriers to Discharge        Equipment Recommendations  None recommended by PT    Recommendations for Other Services    Frequency 7X/week   Plan Frequency remains appropriate;Discharge plan needs to be updated    Precautions / Restrictions Precautions Precautions: Knee Required Braces or Orthoses: Knee Immobilizer - Right Restrictions RLE Weight Bearing: Weight bearing as tolerated   Pertinent Vitals/Pain Pt c/o of dizziness. VSS.    Mobility  Bed Mobility Bed Mobility: Rolling Right;Rolling Left;Sitting - Scoot to Edge of Bed Rolling Right: 4: Min guard;With rail Rolling Left: 4: Min guard;With rail Supine to Sit: 4: Min assist Sitting - Scoot to Edge of Bed: 4: Min assist Details for Bed Mobility Assistance: Cueing for safe technique and supporting the RLE. Use of pad to sccot edge of bed.  Transfers Sit to Stand: 4: Min assist;From bed;With upper extremity assist Stand to Sit: 3: Mod assist;With upper extremity assist;To chair/3-in-1 Details for Transfer Assistance: Mod A for guiding hips and controlling descent secondary to c/o of dizziness for stand to sit transfer.   Ambulation/Gait Ambulation/Gait Assistance: 4: Min guard Ambulation Distance (Feet): 10 Feet Assistive device: Rolling walker Ambulation/Gait Assistance Details: Cueing for technique, gait sequence and upright positioning.  Gait Pattern: Step-to pattern;Decreased step length - left;Decreased stance time - right;Trunk flexed General Gait Details: Pt became dizzy and had to sit immediately. Dizziness subsided upon sitting.    Exercises Total Joint Exercises Quad Sets: AROM;10 reps;Both Heel Slides: AAROM;Right;10 reps Hip ABduction/ADduction: AAROM;Right;10 reps   PT Diagnosis:    PT Problem List:   PT Treatment Interventions:     PT Goals Acute Rehab PT Goals PT Goal: Supine/Side to Sit - Progress: Progressing toward goal PT Goal: Sit to Stand - Progress: Progressing toward goal PT Goal: Stand to Sit - Progress: Progressing toward goal PT Goal: Ambulate - Progress: Progressing toward goal PT Goal: Perform Home Exercise Program - Progress: Progressing toward goal  Visit Information  Last PT Received On: 12/15/12 Assistance Needed: +1    Subjective Data  Subjective: I'm not going to be able to go home.   Cognition  Cognition Overall Cognitive Status: Appears within functional limits for tasks assessed/performed Arousal/Alertness: Awake/alert Orientation Level: Appears intact for tasks assessed Behavior During Session: [facility] for tasks performed    Balance     End of Session PT - End of Session Equipment Utilized During Treatment: Gait belt Activity Tolerance: Patient tolerated treatment well;Other (comment) (c/o of dizziness throughout) Patient left: in chair;with call bell/phone within reach Nurse Communication: Mobility status   GP     Lazaro Arms 12/15/2012, 9:41 AM Mckinley Jewel, SPTA [phone]

## 2012-12-15 NOTE — Clinical Social Work Psychosocial (Addendum)
Clinical Social Work Placement 12/15/2012 2:41 PM   Clinical Social Work Department  CLINICAL SOCIAL WORK PLACEMENT NOTE  12/15/2012  Patient: [name] [name] Account Number: [id number]  Admit date: 12/13/2012  Clinical Social Worker: Sabino Niemann, MSW Date/time: 12/15/2012 11:30 AM  Clinical Social Work is seeking post-discharge placement for this patient at the following level of care: SKILLED NURSING (*CSW will update this form in Epic as items are completed)  12/15/2012 Patient/family provided with [facility] Department of Clinical Social Work's list of facilities offering this level of care within the geographic area requested by the patient (or if unable, by the patient's family).  2/6/2014Patient/family informed of their freedom to choose among providers that offer the needed level of care, that participate in Medicare, Medicaid or managed care program needed by the patient, have an available bed and are willing to accept the patient.  12/15/2012 Patient/family informed of [facility]' ownership interest in [facility], as well as of the fact that they are under no obligation to receive care at this facility.  PASARR submitted to EDS on 12/16/2012 PASARR number received from EDS on 12/16/12 FL2 transmitted to all facilities in geographic area requested by pt/family on  FL2 transmitted to all facilities within larger geographic area on 12/15/2012  Patient informed that his/her managed care company has contracts with or will negotiate with certain facilities, including the following:  Patient/family informed of bed offers received:  Patient chooses bed at [facility]) Physician recommends and patient chooses bed at SNF ([facility]) Patient to be transferred to on 12/17/12 [facility]) Patient to be transferred to facility by Family transport [facility]) The following physician request were entered in Epic:  Additional Comments:  Sabino Niemann, [phone]

## 2012-12-15 NOTE — Progress Notes (Signed)
Subjective: 3 Days Post-Op Procedure(s) (LRB): TOTAL KNEE ARTHROPLASTY (Right) Patient reports pain as 3 on 0-10 scale.   No c/os. Some dizziness when up yesterday.Pt reports better this am.  Objective: Vital signs in last 24 hours: Temp:  [97.8 F (36.6 C)-98.4 F (36.9 C)] 98.4 F (36.9 C) (02/06 0516) Pulse Rate:  [66-78] 70  (02/06 0516) Resp:  [18] 18  (02/06 0516) BP: (93-146)/(44-56) 139/56 mmHg (02/06 0516) SpO2:  [96 %-98 %] 98 % (02/06 0516)  Intake/Output from previous day: 02/05 0701 - 02/06 0700 In: 100 [P.O.:100] Out: -  Intake/Output this shift:     Basename 12/15/12 0550 12/14/12 0525 12/13/12 0750 12/13/12 0440  HGB 10.2* 8.9* 11.7* 10.8*    Basename 12/15/12 0550 12/14/12 0525  WBC 7.7 9.3  RBC 3.29* 2.88*  HCT 30.9* 26.6*  PLT 148* 163    Basename 12/15/12 0550 12/14/12 0525  NA 137 139  K 3.6 3.9  CL 104 107  CO2 25 27  BUN 9 20  CREATININE 0.79 0.96  GLUCOSE 111* 121*  CALCIUM 8.5 8.4   Right knee: Neurovascular intact Sensation intact distally Intact pulses distally Dorsiflexion/Plantar flexion intact Incision: dressing C/D/I Compartment soft  Assessment/Plan: 3 Days Post-Op Procedure(s) (LRB): TOTAL KNEE ARTHROPLASTY (Right) ABLA   Improved Plan: Discharge home with home health D/C IV.  Girtrude Enslin G 12/15/2012, 8:09 AM

## 2012-12-15 NOTE — Consult Note (Signed)
CARDIOLOGY CONSULT NOTE   Patient ID: [name] [name] MRN: [id number] DOB/AGE: 1924-08-07 79 y.o.  Admit date: 12/12/2012  Primary Physician   [name], MD Primary Cardiologist   SK Reason for Consultation  Syncope  [name] C [name] is a 79 y.o. female with a history of arrhythmia, last seen by Dr [name] on 11/23/2012. She was hospitalized in April of 2013 with symptomatic wide-complex tachycardia. At that time, Dr. [name] recommended ablation. Because of insurance issues, she switched to Dr. [name]. Per Dr. [name] note 11/23/2012, "my impression on review it represented light ventricular tachycardia", she did not tolerate amiodarone. She had fatigue with metoprolol so he changed her to by systolic. Her condition was otherwise stable.  She was admitted for a right knee replacement on 12/12/2012. She tolerated the procedure well. She had anemia post procedure with a hemoglobin of 8.9 but this is improved. She feels she did very well after the surgery but since then has had problems with being weak and becoming dizzy whenever she gets up out of bed. She also had feelings of being lightheaded in a recliner. Her symptoms were worse when she up to the bathroom yesterday.   Today, she felt that she was a little improved. However, when she got up to the bathroom with the nursing staff she reported dizziness and had a brief syncopal episode when they were coming back from the bathroom. She woke up in the bed and states she was confused right after she woke up and now feels weak but otherwise normal. She had no chest pain or shortness of breath. She has no palpitations and no awareness of her heart beating fast or of an irregular heart rate. She was not on telemetry at that time.   First vital signs taken after the incident were blood pressure 153/61 with a heart rate of 108 and oxygen saturation 92%. She has not been hypotensive since the surgery. She was continued on her beta blocker since the surgery  and the Xarelto was restarted the day after the surgery. It had been held since 12/09/2012. Currently she feels very weak but is resting comfortably. An ECG shows sinus rhythm with no significant changes from her previous ECG.    Past Medical History  Diagnosis Date  . IBS (irritable bowel syndrome)   . Diverticulosis   . Colon polyps   . High cholesterol   . MI (myocardial infarction) 1975    Normal left ventricular function and nonischemic Myoview 2013  . GERD (gastroesophageal reflux disease)   . Hypertension   . Renal stone   . Bronchiectasis, non-tuberculous   . DVT (deep venous thrombosis)     Resolved, no anticoagulation currently  . 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 Palpitation     PACs and PVCs  . Pulmonary fibrosis   . First degree AV block     Conduction system  . Wide-complex tachycardia     I presume this is VT  . Pulmonary embolism     Following knee surgery with a prior history  . COPD (chronic obstructive pulmonary disease)     past hx with pneumonia  . Stroke      Past Surgical History  Procedure Date  . Total abdominal hysterectomy   . Appendectomy   . Bladder tack   . Cholecystectomy   . Colon surgery     12inches colectomy s/p diverticulitis  . Tonsillectomy   . Total knee arthroplasty 12/12/2012    Procedure: TOTAL KNEE ARTHROPLASTY;  Surgeon: Harvie Junior, MD;  Location[facility];  Service: Orthopedics;  Laterality: Right;  right total knee arthroplasty    Allergies  Allergen Reactions  . Crestor (Rosuvastatin) Other (See Comments)    Myalgia  . Darvon     Drop in  BP    I have reviewed the patient's current medications    . atorvastatin  20 mg Oral q1800  . docusate sodium  100 mg Oral BID  . ferrous sulfate  325 mg Oral BID WC  . nebivolol  2.5 mg Oral Daily  . Rivaroxaban  20 mg Oral Q breakfast      . sodium chloride 1,000 mL (12/15/12 0640)   alum & mag hydroxide-simeth, diphenhydrAMINE,  HYDROcodone-acetaminophen, HYDROmorphone (DILAUDID) injection, methocarbamol (ROBAXIN) IV, methocarbamol, metoCLOPramide (REGLAN) injection, metoCLOPramide, ondansetron (ZOFRAN) IV, ondansetron, oxyCODONE, polyethylene glycol, zolpidem  Prior to Admission medications   Medication Sig Start Date End Date Taking? Authorizing Provider  atorvastatin (LIPITOR) 20 MG tablet Take 20 mg by mouth Daily.    Yes Historical Provider, MD  nebivolol (BYSTOLIC) 2.5 MG tablet Take 1 tablet (2.5 mg total) by mouth daily. 11/23/12  Yes Duke Salvia, MD  Rivaroxaban (XARELTO) 20 MG TABS Take 20 mg by mouth daily.   Yes Historical Provider, MD  methocarbamol (ROBAXIN-750) 500 MG tablet Take 1 tablet (500 mg total) by mouth 2 (two) times daily as needed (spasm.). 12/15/12   Matthew Folks, PA  oxyCODONE-acetaminophen (PERCOCET/ROXICET) 5-325 MG per tablet Take 1-2 tablets by mouth every 6 (six) hours as needed for pain. 12/15/12   Matthew Folks, PA     History   Social History  . Marital Status: Married    Spouse Name: N/A    Number of Children: 3  . Years of Education: N/A   Occupational History  . Retired     [employer]   Social History Main Topics  . Smoking status: Never Smoker   . Smokeless tobacco: Never Used  . Alcohol Use: No  . Drug Use: No  . Sexually Active: Not on file   Other Topics Concern  . Not on file   Social History Narrative  .  lives with husband, they have been married 63 years      Family History  Problem Relation Age of Onset  . Stomach cancer Brother   . Heart disease Father   . Heart attack Mother   . Other Son     Triple Bypass     ROS:  Full 14 point review of systems complete and found to be negative unless listed above.  Physical Exam: Blood pressure 157/61, pulse 66, temperature 97.8 F (36.6 C), temperature source Oral, resp. rate 16, height 5' 6.14" (1.68 m), weight 151 lb 3.8 oz (68.6 kg), SpO2 100.00%.  General: Well developed, well  nourished, elderly female in no acute distress Head: Eyes PERRLA, No xanthomas.   Normocephalic and atraumatic, oropharynx without edema or exudate. Dentition: Poor Lungs: Few bilateral rales Heart: HRRR S1 S2, no rub/gallop, soft systolic murmur. pulses are 2+ all 4 extrem.   Neck: No carotid bruits. No lymphadenopathy.  JVD not elevated. Abdomen: Bowel sounds present, abdomen soft and non-tender without masses or hernias noted. Msk:  No spine or cva tenderness. Generalized weakness, no joint deformities or effusions. Right knee is bandaged and dressed and not disturbed, no significant drainage seen Extremities: No clubbing or cyanosis. No edema.  Neuro: A little sleepy but is appropriate and oriented X 3. No  focal deficits noted. Psych:  Good affect, responds appropriately Skin: No rashes or lesions noted.  Labs:   Lab Results  Component Value Date   WBC 7.7 12/15/2012   HGB 10.2* 12/15/2012   HCT 30.9* 12/15/2012   MCV 93.9 12/15/2012   PLT 148* 12/15/2012   No results found for this basename: INR in the last 72 hours   Lab 12/15/12 0550  NA 137  K 3.6  CL 104  CO2 25  BUN 9  CREATININE 0.79  CALCIUM 8.5  PROT --  BILITOT --  ALKPHOS --  ALT --  AST --  GLUCOSE 111*    Echo: 08/23/2009 Results Preserved left ventricular function with impaired relaxation. Borderline left atrial enlargement. Mild aortic valve sclerosis without stenosis. Mild MR.  ECG:  15-Dec-2012 10:47:40 [facility]-5N ROUTINE RECORD Sinus rhythm with 1st degree A-V block Minimal voltage criteria for LVH, may be normal variant Possible Anterior infarct , age undetermined Abnormal ECG 38mm/s 23mm/mV 100Hz  8.0.1 12SL 241 HD CID: 1 Referred by: Unconfirmed Vent. rate 76 BPM PR interval 252 ms QRS duration 102 ms QT/QTc 394/443 ms P-R-T axes 12 -24 35  Radiology:  No results found.  ASSESSMENT AND PLAN:   The patient was seen today by Dr Patty Sermons, the patient evaluated and the data  reviewed.   Syncope - there is no clear cardiac cause of the syncope. We will check lower extremity Dopplers to rule out DVT as she was off her Xarelto for total of 5 days (including the preoperative period). We'll also order orthostatic vital signs. Currently, she is in sinus rhythm but still feels weak and feels that she will get dizzy she tries to sit up or stand. M.D. advise on further evaluation. Consider decreasing dose of oxycodone as this may be contributing to dizziness and presyncope.  Principal Problem:  *Osteoarthritis of right knee Active Problems:  Acute blood loss anemia   Signed: Theodore Demark 12/15/2012, 11:56 AM Co-Sign MD Agree with assessment and plan as noted above by Theodore Demark PAC.  The patient states that each time she has gotten up to go to the bathroom postoperatively she has felt dizzy and lightheaded.  Today she actually had a brief episode of syncope.  She is now on telemetry.  Telemetry has not shown any arrhythmias.  She does have blood loss anemia but is not extreme. She is not on any diuretics or medications which would cause her to be hypovolemic.  We will check orthostatic vital signs.  We will encourage by mouth fluids.  Does not show any evidence of congestive heart failure and her lungs are clear.  We will update her echocardiogram.  Will follow with you.

## 2012-12-16 ENCOUNTER — Encounter (HOSPITAL_COMMUNITY): Payer: Self-pay | Admitting: Radiology

## 2012-12-16 ENCOUNTER — Inpatient Hospital Stay (HOSPITAL_COMMUNITY): Payer: Medicare HMO

## 2012-12-16 DIAGNOSIS — I824Z9 Acute embolism and thrombosis of unspecified deep veins of unspecified distal lower extremity: Secondary | ICD-10-CM | POA: Clinically undetermined

## 2012-12-16 DIAGNOSIS — I2510 Atherosclerotic heart disease of native coronary artery without angina pectoris: Secondary | ICD-10-CM

## 2012-12-16 DIAGNOSIS — R55 Syncope and collapse: Secondary | ICD-10-CM

## 2012-12-16 LAB — BASIC METABOLIC PANEL
BUN: 6 mg/dL (ref 6–23)
CO2: 27 mEq/L (ref 19–32)
Chloride: 105 mEq/L (ref 96–112)
Creatinine, Ser: 0.72 mg/dL (ref 0.50–1.10)
Glucose, Bld: 121 mg/dL — ABNORMAL HIGH (ref 70–99)

## 2012-12-16 LAB — CBC
HCT: 27.8 % — ABNORMAL LOW (ref 36.0–46.0)
Hemoglobin: 9.3 g/dL — ABNORMAL LOW (ref 12.0–15.0)
MCHC: 33.5 g/dL (ref 30.0–36.0)
MCV: 94.9 fL (ref 78.0–100.0)
Platelets: 183 10*3/uL (ref 150–400)
RDW: 13.1 % (ref 11.5–15.5)

## 2012-12-16 MED ORDER — IOHEXOL 350 MG/ML SOLN
100.0000 mL | Freq: Once | INTRAVENOUS | Status: AC | PRN
  Administered 2012-12-16: 100 mL via INTRAVENOUS

## 2012-12-16 MED ORDER — HYDROCODONE-ACETAMINOPHEN 5-325 MG PO TABS
1.0000 | ORAL_TABLET | ORAL | Status: DC | PRN
  Administered 2012-12-17: 1 via ORAL
  Filled 2012-12-16: qty 1

## 2012-12-16 MED ORDER — IOHEXOL 350 MG/ML SOLN: 100.0000 mL | Freq: Once | INTRAVENOUS | Status: DC | PRN

## 2012-12-16 MED ORDER — HYDROCODONE-ACETAMINOPHEN 5-325 MG PO TABS: 1.0000 | ORAL_TABLET | Freq: Four times a day (QID) | ORAL | Status: DC | PRN

## 2012-12-16 MED ORDER — WHITE PETROLATUM GEL
Status: AC
  Filled 2012-12-16: qty 5

## 2012-12-16 NOTE — Discharge Summary (Signed)
Patient ID: [name] [name] MRN: [id number] DOB/AGE: [birth year] 79 y.o.  Admit date: 12/12/2012 Discharge date: 2 /8/14 Admission Diagnoses:  Principal Problem:  *Osteoarthritis of right knee Active Problems:  Acute blood loss anemia  Syncope  Deep vein thrombosis of calf   Discharge Diagnoses:  Same  Past Medical History  Diagnosis Date  . IBS (irritable bowel syndrome)   . Diverticulosis   . Colon polyps   . High cholesterol   . MI (myocardial infarction) 1975    Normal left ventricular function and nonischemic Myoview 2013  . GERD (gastroesophageal reflux disease)   . Hypertension   . Renal stone   . Bronchiectasis, non-tuberculous   . DVT (deep venous thrombosis)     Resolved, no anticoagulation currently  . 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 Palpitation     PACs and PVCs  . Pulmonary fibrosis   . First degree AV block     Conduction system  . Wide-complex tachycardia     I presume this is VT  . Pulmonary embolism     Following knee surgery with a prior history  . COPD (chronic obstructive pulmonary disease)     past hx with pneumonia  . Stroke     Surgeries: Procedure(s):Right TOTAL KNEE ARTHROPLASTY on 12/12/2012   Consultants: Treatment Team:  Rounding Lbcardiology, MD  Discharged Condition: Improved  Hospital Course: [name] [name] is an 79 y.o. female who was admitted 12/12/2012 for operative treatment ofOsteoarthritis of right knee. Patient has severe unremitting pain that affects sleep, daily activities, and work/hobbies. After pre-op clearance the patient was taken to the operating room on 12/12/2012 and underwent  Procedure(s):Right TOTAL KNEE ARTHROPLASTY.  She had episodes of syncope when up on POD #3. Cardiology was consulted. Venous doppler +for right calf DVT.  Echo ordered with results pending.  Patient was given perioperative antibiotics: Anti-infectives     Start     Dose/Rate Route Frequency Ordered Stop   12/12/12 1900    ceFAZolin (ANCEF) IVPB 2 g/50 mL premix        2 g 100 mL/hr over 30 Minutes Intravenous Every 6 hours 12/12/12 1715 12/13/12 0134   12/12/12 1407   cefUROXime (ZINACEF) injection  Status:  Discontinued          As needed 12/12/12 1407 12/12/12 1530   12/12/12 0600   ceFAZolin (ANCEF) IVPB 2 g/50 mL premix        2 g 100 mL/hr over 30 Minutes Intravenous On call to O.R. 12/11/12 1220 12/12/12 1325           Patient was given sequential compression devices, early ambulation, and chemoprophylaxis of Xeralto to decease risk of DVT.  Patient benefited maximally from hospital stay .    Recent vital signs: Patient Vitals for the past 24 hrs:  BP Temp Temp src Pulse Resp SpO2  12/16/12 0643 137/53 mmHg 98.6 F (37 C) Oral 78  - 98 %  2012/12/29 2106 121/93 mmHg 98.2 F (36.8 C) Oral 73  - 97 %  December 29, 2012 1821 126/44 mmHg - - 81  - -  2012/12/29 1818 124/56 mmHg - - 88  - -  29-Dec-2012 1815 127/71 mmHg - - 78  - -  29-Dec-2012 1814 140/53 mmHg - - 68  - -  12-29-12 1400 144/45 mmHg 97.6 F (36.4 C) - 69  18  100 %     Recent laboratory studies:  Basename 12/16/12 0650 2012/12/29 0550  WBC 6.4 7.7  HGB  9.3* 10.2*  HCT 27.8* 30.9*  PLT 183 148*  NA 139 137  K 3.7 3.6  CL 105 104  CO2 27 25  BUN 6 9  CREATININE 0.72 0.79  GLUCOSE 121* 111*  INR -- --  CALCIUM 8.8 --     Discharge Medications:     Medication List     As of 12/16/2012 11:39 AM    TAKE these medications         atorvastatin 20 MG tablet   Commonly known as: LIPITOR   Take 20 mg by mouth Daily.      HYDROcodone-acetaminophen 5-325 MG per tablet   Commonly known as: NORCO/VICODIN   Take 1 tablet by mouth every 6 (six) hours as needed for pain.      methocarbamol 500 MG tablet   Commonly known as: ROBAXIN   Take 1 tablet (500 mg total) by mouth 2 (two) times daily as needed (spasm.).      nebivolol 2.5 MG tablet   Commonly known as: BYSTOLIC   Take 1 tablet (2.5 mg total) by mouth daily.       oxyCODONE-acetaminophen 5-325 MG per tablet   Commonly known as: PERCOCET/ROXICET   Take 1-2 tablets by mouth every 6 (six) hours as needed for pain.      XARELTO 20 MG Tabs   Generic drug: Rivaroxaban   Take 20 mg by mouth daily.      Do not use Oxycodone.   Use Norco prn pain.  Diagnostic Studies:   Disposition: SNF      Discharge Orders    Future Orders Please Complete By Expires   Diet general      Constipation Prevention      Comments:   Drink plenty of fluids.  Prune juice may be helpful.  You may use a stool softener, such as Colace (over the counter) 100 mg twice a day.  Use MiraLax (over the counter) for constipation as needed.   Increase activity slowly as tolerated      Weight bearing as tolerated      CPM      Comments:   Continuous passive motion machine (CPM):      Use the CPM from 0 degrees to 60 degrees for 6-8 hours per day.      You may increase by 10 dgrees per day.  You may break it up into 2 or 3 sessions per day.      Use CPM for1-2 weeks or until you are told to stop.   Do not put a pillow under the knee. Place it under the heel.         Follow-up Information    Follow up with GRAVES,JOHN L, MD. Schedule an appointment as soon as possible for a visit in 2 weeks.   Contact information:   [address] [place] [address] [phone]           Signed: Matthew Folks 12/16/2012, 11:39 AM

## 2012-12-16 NOTE — Progress Notes (Signed)
Physical Therapy Treatment Patient Details Name: [name] [name] MRN: [id number] DOB: Apr 27, 1924 Today's Date: 12/16/2012 Time: 4401-0272 PT Time Calculation (min): 24 min  PT Assessment / Plan / Recommendation Comments on Treatment Session  Pt progressing this session with increased activity tolerance. Pt is attempting to be more independent. No c/o of dizziness or lightheadedness this session. Will continue to progress including ambulation next visit.    Follow Up Recommendations  SNF;Supervision/Assistance - 24 hour     Does the patient have the potential to tolerate intense rehabilitation     Barriers to Discharge        Equipment Recommendations  None recommended by PT    Recommendations for Other Services    Frequency 7X/week   Plan Discharge plan remains appropriate;Frequency remains appropriate    Precautions / Restrictions Precautions Precautions: Knee Precaution Comments: Discontinue KI per PA Restrictions Weight Bearing Restrictions: Yes RLE Weight Bearing: Weight bearing as tolerated        Mobility  Bed Mobility Rolling Right: 4: Min guard;With rail Supine to Sit: 4: Min assist;HOB elevated Sitting - Scoot to Edge of Bed: 4: Min guard Details for Bed Mobility Assistance: Heavy reliance on rails. Pt now able to scoot with no physical assistance.  Transfers Transfers: Stand Pivot Transfers Sit to Stand: 4: Min guard;From bed;With upper extremity assist;From chair/3-in-1 Stand to Sit: 4: Min guard Stand Pivot Transfers: 4: Min assist Details for Transfer Assistance:  Tactile cueing to  manage hips and controll descent for stand pivot trainsfer.    Exercises Total Joint Exercises Ankle Circles/Pumps: AROM;Both;10 reps Quad Sets: AROM;Both;10 reps Heel Slides: AAROM;Right;15 reps Hip ABduction/ADduction: AAROM;Right;15 reps Long Arc Quad: AAROM;Right;10 reps   PT Diagnosis:    PT Problem List:   PT Treatment Interventions:     PT Goals Acute Rehab PT  Goals PT Goal: Supine/Side to Sit - Progress: Progressing toward goal PT Goal: Sit to Stand - Progress: Progressing toward goal PT Goal: Stand to Sit - Progress: Progressing toward goal PT Goal: Perform Home Exercise Program - Progress: Progressing toward goal  Visit Information  Last PT Received On: 12/16/12 Assistance Needed: +1    Subjective Data      Cognition  Cognition Overall Cognitive Status: Appears within functional limits for tasks assessed/performed Arousal/Alertness: Awake/alert Orientation Level: Appears intact for tasks assessed Behavior During Session: [facility] for tasks performed    Balance     End of Session PT - End of Session Equipment Utilized During Treatment: Gait belt Activity Tolerance: Patient tolerated treatment well Patient left: in chair;with call bell/phone within reach CPM Right Knee CPM Right Knee: Off   GP     Lazaro Arms 12/16/2012, 11:51 AM

## 2012-12-16 NOTE — Progress Notes (Signed)
Patient: [name] [name] / Admit Date: 12/12/2012 / Date of Encounter: 12/16/2012, 10:12 AM   Subjective  Much less dizzy when she stands up today. She does note that perhaps she does have a little SOB when she ambulates.   Objective   Telemetry: NSR, 1st degree AVB. Occasionally widens her QRS but remains NSR Physical Exam: Filed Vitals:   12/16/12 0643  BP: 137/53  Pulse: 78  Temp: 98.6 F (37 C)  Resp:   98% RA General: Well developed, well nourished WF in no acute distress. Head: Normocephalic, atraumatic, sclera non-icteric, no xanthomas, nares are without discharge. Neck: Negative for carotid bruits. JVD not elevated. Lungs: Clear bilaterally to auscultation without wheezes, rales, or rhonchi. Breathing is unlabored. Heart: RRR S1 S2 without murmurs, rubs, or gallops.  Abdomen: Soft, non-tender, non-distended with normoactive bowel sounds. No hepatomegaly. No rebound/guarding. No obvious abdominal masses. Msk:  Strength and tone appear normal for age. Extremities: No clubbing or cyanosis. No edema.  Distal pedal pulses are 2+ and equal bilaterally. RLE in boot Neuro: Alert and oriented X 3. Moves all extremities spontaneously. Psych:  Responds to questions appropriately with a normal affect.    Intake/Output Summary (Last 24 hours) at 12/16/12 1012 Last data filed at 12/16/12 0500  Gross per 24 hour  Intake   1120 ml  Output      0 ml  Net   1120 ml    Inpatient Medications:    . atorvastatin  20 mg Oral q1800  . docusate sodium  100 mg Oral BID  . ferrous sulfate  325 mg Oral BID WC  . nebivolol  2.5 mg Oral Daily  . Rivaroxaban  20 mg Oral Q breakfast  . white petrolatum        Labs:  [facility] 12/16/12 0650 12/15/12 0550  NA 139 137  K 3.7 3.6  CL 105 104  CO2 27 25  GLUCOSE 121* 111*  BUN 6 9  CREATININE 0.72 0.79  CALCIUM 8.8 8.5  MG -- --  PHOS -- --   Basename 12/16/12 0650 12/15/12 0550  WBC 6.4 7.7  NEUTROABS -- --  HGB 9.3* 10.2*  HCT 27.8*  30.9*  MCV 94.9 93.9  PLT 183 148*    Radiology/Studies:  LE dopp 12/15/12 - Bilateral lower extremity venous duplex completed.  The right lower extremity is positive for deep vein thrombosis involving the right posterior tibial veins. There is no evidence of a left Baker's cyst.  Unable to visualize the right popliteal vein due to surgical dressings and patient position, therefore unable to exclude deep vein thrombosis in this vein.  Preliminary results discussed with Chari Manning, RN.   Assessment and Plan  1. R knee replacement POD #4 - per surgery. 2. RLE DVT by dopplers yesterday - presumably acute given negative dopplers 09/2012. Has history of post-operative DVT 40 years ago, and PE in Sept 2013 (was on Xarelto prior to [employer]). Sees pulm for this history. Will discuss Xarelto dosing with MD. 3. Syncope yesterday - no clear cardiac cause (was not on telemetry at that time), has had very mild orthostatic HR increase but not a significant BP drop. Less dizziness upon standing today after PO intake was encouraged. She does have a DVT, so PE is also consideration thus continue Xarelto. Follow on telemetry. Continue BB. 2D echo pending. 4. H/o VT - intolerant to amiodarone in the past and some beta blockers, currently tolerating nebivolol. MD to review telemetry and weigh in on widened  QRS. 5. ABL anemia - stable.  Signed, Ronie Spies PA-C  Patient seen and examined  Agree with findings of D Dunn.   On exam Lungs CTA  Cardiac  RRR  Ext  Tr edema Echo with normal LV and RV function Patient denies SOB at rest but does give out with walking   With DVT I think it is important to R/O PE  Would rec CT angio of chest.  WIll order.  This will determine anticoag dosing as well as duration. Cut back on IV fluids.

## 2012-12-16 NOTE — Progress Notes (Signed)
  Echocardiogram 2D Echocardiogram has been performed.  [name] [name] [name] [name] 12/16/2012, 11:14 AM

## 2012-12-16 NOTE — Progress Notes (Signed)
Seen and agreed 12/16/2012 Robinette, Julia Elizabeth PTA [phone] pager [phone] office    

## 2012-12-16 NOTE — Progress Notes (Signed)
Subjective: 4 Days Post-Op Procedure(s) (LRB): TOTAL KNEE ARTHROPLASTY (Right) Patient reports pain as 2 on 0-10 scale.   Had syncopal episode yesterday when up. Appreciate cardiology help. Had Cardiac echo today. Had venous doppler yest  Pos for right calf DVT. Denies CP . Only mild SOB when up. Using minimal pain meds. Objective: Vital signs in last 24 hours: Temp:  [97.6 F (36.4 C)-98.6 F (37 C)] 98.6 F (37 C) (02/07 0643) Pulse Rate:  [68-88] 78  (02/07 0643) Resp:  [18] 18  (02/06 1400) BP: (121-144)/(44-93) 137/53 mmHg (02/07 0643) SpO2:  [97 %-100 %] 98 % (02/07 0643)  Intake/Output from previous day: 02/06 0701 - 02/07 0700 In: 1240 [P.O.:340; I.V.:900] Out: -  Intake/Output this shift:     Basename 12/16/12 0650 12/15/12 0550 12/14/12 0525  HGB 9.3* 10.2* 8.9*    Basename 12/16/12 0650 12/15/12 0550  WBC 6.4 7.7  RBC 2.93* 3.29*  HCT 27.8* 30.9*  PLT 183 148*    Basename 12/16/12 0650 12/15/12 0550  NA 139 137  K 3.7 3.6  CL 105 104  CO2 27 25  BUN 6 9  CREATININE 0.72 0.79  GLUCOSE 121* 111*  CALCIUM 8.8 8.5   Right knee exam: Neurovascular intact Sensation intact distally Intact pulses distally Dorsiflexion/Plantar flexion intact Compartment soft Right calf non tender nor swollen.  Assessment/Plan: 4 Days Post-Op Procedure(s) (LRB): TOTAL KNEE ARTHROPLASTY (Right) ABLA-stable on oral Iron DVT right calf. Plan: Discharge to SNF when bed available.Prob in AM. Cont SCD's and Xeralto  For DVT. Mobilize pt.  Bertie Simien G 12/16/2012, 11:23 AM

## 2012-12-17 DIAGNOSIS — I472 Ventricular tachycardia: Secondary | ICD-10-CM

## 2012-12-17 LAB — BASIC METABOLIC PANEL
CO2: 25 mEq/L (ref 19–32)
Calcium: 8.5 mg/dL (ref 8.4–10.5)
Chloride: 108 mEq/L (ref 96–112)
Potassium: 3.4 mEq/L — ABNORMAL LOW (ref 3.5–5.1)
Sodium: 139 mEq/L (ref 135–145)

## 2012-12-17 NOTE — Progress Notes (Signed)
Pt seen 8am this morning, comfortable and pain well controlled, denies SOB/Chest pain, no calf pain, eager to be released to SNF has been up with PT/OT and making gains.  BP 124/62  Pulse 76  Temp(Src) 98 F (36.7 C) (Oral)  Resp 16  Ht 5' 6.14" (1.68 m)  Wt 68.6 kg (151 lb 3.8 oz)  BMI 24.31 kg/m2  SpO2 97% CBC    Component Value Date/Time   WBC 6.4 12/16/2012 0650   RBC 2.93* 12/16/2012 0650   HGB 9.3* 12/16/2012 0650   HCT 27.8* 12/16/2012 0650   PLT 183 12/16/2012 0650   MCV 94.9 12/16/2012 0650   MCH 31.7 12/16/2012 0650   MCHC 33.5 12/16/2012 0650   RDW 13.1 12/16/2012 0650   LYMPHSABS 2.1 12/06/2012 0916   MONOABS 0.6 12/06/2012 0916   EOSABS 0.2 12/06/2012 0916   BASOSABS 0.1 12/06/2012 0916   Pt laying comfortably in hospital bed, breathing non-labored, dressing CDI, DPP 2+ and equal BIL, cap refill <2sec, neg homans BIL, minimal edema in RLE, neurovasculary intact.   A/P 3 Days Post-Op Right TOTAL KNEE ARTHROPLASTY   -ABLA-stable on oral Iron   -DVT right calf, on Xaralto and SCD's  -Discharge to SNF when bed available, FL2 Signed  -D/C on hydrocodone for px control   -Pt should be mobilized several times a day   -Cont Xaralto on D/C, home med, hx of PE

## 2012-12-17 NOTE — Clinical Social Work Note (Signed)
Pt has SNF bed available at [facility] at d/c. Please call if pt for d/c over w/e.  Dellie Burns, MSW, LCSWA [phone] (Weekends 8:00am-4:30pm)

## 2012-12-17 NOTE — Progress Notes (Signed)
Seen and agreed 12/17/2012 Gatha Mcnulty Elizabeth PTA [phone] pager [phone] office    

## 2012-12-17 NOTE — Progress Notes (Signed)
Physical Therapy Treatment Patient Details Name: [name] [name] MRN: [id number] DOB: 1924-01-10 Today's Date: 12/17/2012 Time: 4098-1191 PT Time Calculation (min): 31 min  PT Assessment / Plan / Recommendation Comments on Treatment Session  Pt reported feeling great upon entering but need to go to restroom. Ambulated to restroom requesting to sit prior to ambulating in hall.One min seated rest break and attempted to ambulate into hall . Pt was able to make it to the door before having to sit again due to dizziness. She preferred to go back to bed to lay down  so dizziness could subside. Continue to recommend SNF/ 24hour assistance fro mobility  due to frequent episodes of dizziness and fatigue.     Follow Up Recommendations  SNF;Supervision/Assistance - 24 hour     Does the patient have the potential to tolerate intense rehabilitation     Barriers to Discharge        Equipment Recommendations  None recommended by PT    Recommendations for Other Services    Frequency 7X/week   Plan Discharge plan remains appropriate;Frequency remains appropriate    Precautions / Restrictions Precautions Precautions: Knee Knee Immobilizer - Right:  (Disontinued per PA 12/16/12) Restrictions Weight Bearing Restrictions: Yes RLE Weight Bearing: Weight bearing as tolerated   Pertinent Vitals/Pain C/o of dizziness throughout , but subsiding with rest.    Mobility  Bed Mobility Bed Mobility: Sit to Supine Rolling Right: 5: Supervision;With rail Rolling Left: 5: Supervision;With rail Supine to Sit: 4: Min guard;HOB elevated;With rails Sitting - Scoot to Edge of Bed: 5: Supervision Sit to Supine: 4: Min assist Details for Bed Mobility Assistance: Only physical assist with sit to supine due to c/o dizziness and fatigue. Transfers Sit to Stand: 4: Min guard;From chair/3-in-1;From bed;With upper extremity assist Stand to Sit: 4: Min guard;4: Min assist;To chair/3-in-1;To bed;With upper extremity  assist Details for Transfer Assistance:  Four transfers this session due pt wanting to be cautious with dizziness. Begin Min guard only verbal cueing. Last transfer to requiring assistance to manage LEs and scoot. Ambulation/Gait Ambulation/Gait Assistance: 4: Min guard Ambulation Distance (Feet): 10 Feet (x3) Assistive device: Rolling walker Ambulation/Gait Assistance Details: MIn cueing for upright posture and gait sequence. Gait Pattern: Step-to pattern Gait velocity: decreased    Exercises Total Joint Exercises Long Arc Quad: AAROM;Right;10 reps;Seated Knee Flexion: AAROM;Right;10 reps;Seated   PT Diagnosis:    PT Problem List:   PT Treatment Interventions:     PT Goals Acute Rehab PT Goals PT Goal: Supine/Side to Sit - Progress: Progressing toward goal PT Goal: Sit to Supine/Side - Progress: Progressing toward goal PT Goal: Sit to Stand - Progress: Progressing toward goal PT Goal: Stand to Sit - Progress: Progressing toward goal PT Goal: Ambulate - Progress: Progressing toward goal PT Goal: Perform Home Exercise Program - Progress: Progressing toward goal  Visit Information  Last PT Received On: 12/17/12 Assistance Needed: +1    Subjective Data  Subjective: "I feel great today.I feel like I can go home today. "   Cognition  Cognition Overall Cognitive Status: Appears within functional limits for tasks assessed/performed Arousal/Alertness: Awake/alert Orientation Level: Appears intact for tasks assessed Behavior During Session: [facility] for tasks performed    Balance     End of Session PT - End of Session Equipment Utilized During Treatment: Gait belt Activity Tolerance: Patient limited by fatigue (limited by c/o of dizziness)   GP     Lazaro Arms 12/17/2012, 10:04 AM

## 2012-12-17 NOTE — Clinical Social Work Note (Signed)
Pt to transfer to [facility] today via family transport. Pt, pt's family, and SNF aware of d/c. D/C packet complete with signed fl2, chart copy, and signed hard rx. CSW signing off as no other CSW needs identified at this time.  Dellie Burns, MSW, LCSWA [phone] (Weekends 8:00am-4:30pm)

## 2012-12-17 NOTE — Progress Notes (Signed)
Patient ID: [name] [name], female   DOB: [birth year], 79 y.o.   MRN: [id number] Subjective:  No chest pain or sob. Leg feels full.  Objective:  Vital Signs in the last 24 hours: Temp:  [98 F (36.7 C)-98.4 F (36.9 C)] 98 F (36.7 C) (02/08 0641) Pulse Rate:  [72-78] 76 (02/08 0641) Resp:  [16-18] 16 (02/08 0641) BP: (124-144)/(55-62) 124/62 mmHg (02/08 0641) SpO2:  [97 %-100 %] 97 % (02/08 0641)  Intake/Output from previous day: 02/07 0701 - 02/08 0700 In: 600 [P.O.:360; I.V.:240] Out: -  Intake/Output from this shift:    Physical Exam: Well appearing NAD HEENT: Unremarkable Neck:  No JVD, no thyromegally Lungs:  Clear with no wheezes, rales, or rhonchi HEART:  Regular rate rhythm, no murmurs, no rubs, no clicks Abd:  soft, positive bowel sounds, no organomegally, no rebound, no guarding Ext:  2 plus pulses, no edema, no cyanosis, no clubbing Skin:  No rashes no nodules Neuro:  CN II through XII intact, motor grossly intact  Lab Results:  Recent Labs  12/15/12 0550 12/16/12 0650  WBC 7.7 6.4  HGB 10.2* 9.3*  PLT 148* 183    Recent Labs  12/16/12 0650 12/17/12 0520  NA 139 139  K 3.7 3.4*  CL 105 108  CO2 27 25  GLUCOSE 121* 112*  BUN 6 6  CREATININE 0.72 0.71   No results found for this basename: TROPONINI, CK, MB,  in the last 72 hours Hepatic Function Panel No results found for this basename: PROT, ALBUMIN, AST, ALT, ALKPHOS, BILITOT, BILIDIR, IBILI,  in the last 72 hours No results found for this basename: CHOL,  in the last 72 hours No results found for this basename: PROTIME,  in the last 72 hours  Imaging: Ct Angio Chest Pe W/cm &/or Wo Cm  12/16/2012  *RADIOLOGY REPORT*  Clinical Data: The venous thrombosis, shortness of breath, syncope, history of right total knee replacement  CT ANGIOGRAPHY CHEST  Technique:  Multidetector CT imaging of the chest using the standard protocol during bolus administration of intravenous contrast. Multiplanar  reconstructed images including MIPs were obtained and reviewed to evaluate the vascular anatomy.  Contrast: OMNIPAQUE IOHEXOL 350 MG/ML SOLN  Comparison: Chest x-ray of 11/14/2012  Findings: The pulmonary arteries are well opacified and there is no evidence of acute pulmonary embolism.  The thoracic aorta is not well opacified and cannot be evaluated.  There is atheromatous change throughout the aortic arch and descending thoracic aorta and cardiomegaly is noted.  There may be a small low attenuation nodule in the right lobe of thyroid.  On the lung window images, there is biapical pleuroparenchymal scarring present. There are more pulmonary nodules within the right upper lobe than noted on the prior study.  However the dominant nodule anteriorly in the right upper lobe measures 5 mm in diameter compared to 4 mm previously.  This would be very atypical for metastatic disease and most likely is inflammatory in nature. Scarring and bronchiectatic change is noted anteriorly and inferiorly within the right upper lobe and within the anterior basal left lower lobe as well.  No active infiltrate or effusion is seen.  No acute skeletal abnormality is seen.  IMPRESSION:  1.  No evidence of acute pulmonary embolism. 2.  More nodularity within the right upper lobe than on the prior study although several other nodules noted previously are stable, most consistent with an inflammatory process, possibly granulomatous in nature. 3.  Stable scarring and bronchiectatic change  within the anterior inferior right upper lobe and anterior basal left lower lobe.   Original Report Authenticated By: Dwyane Dee, M.D.     Cardiac Studies: Tele - nsr Assessment/Plan:  1. DVT after knee surgery 2. Wide QRS tachy 3. HTN Rec: ok for discharge on Xarelto from cardiac perspective. Continue home meds. Please call for questions.  LOS: 5 days    Trystan Eads,M.D. 12/17/2012, 8:43 AM

## 2013-01-17 ENCOUNTER — Ambulatory Visit (HOSPITAL_COMMUNITY)
Admission: RE | Admit: 2013-01-17 | Discharge: 2013-01-17 | Disposition: A | Discharge status: Home / Self Care | Payer: Medicare HMO | Source: Ambulatory Visit | Attending: Orthopedic Surgery | Admitting: Orthopedic Surgery

## 2013-01-17 ENCOUNTER — Other Ambulatory Visit (HOSPITAL_COMMUNITY): Payer: Self-pay | Admitting: Orthopedic Surgery

## 2013-01-17 DIAGNOSIS — M25561 Pain in right knee: Secondary | ICD-10-CM

## 2013-01-17 DIAGNOSIS — M1711 Unilateral primary osteoarthritis, right knee: Secondary | ICD-10-CM

## 2013-01-17 DIAGNOSIS — M7989 Other specified soft tissue disorders: Secondary | ICD-10-CM

## 2013-01-17 DIAGNOSIS — I2699 Other pulmonary embolism without acute cor pulmonale: Secondary | ICD-10-CM

## 2013-01-17 DIAGNOSIS — M79609 Pain in unspecified limb: Secondary | ICD-10-CM

## 2013-01-17 DIAGNOSIS — M25569 Pain in unspecified knee: Secondary | ICD-10-CM | POA: Insufficient documentation

## 2013-01-17 NOTE — Progress Notes (Signed)
Right:  No evidence of DVT, superficial thrombosis, or Baker's cyst.  Left:  Negative for DVT in the common femoral vein.  

## 2013-01-19 ENCOUNTER — Other Ambulatory Visit: Payer: Self-pay | Admitting: Internal Medicine

## 2013-04-09 ENCOUNTER — Inpatient Hospital Stay (HOSPITAL_COMMUNITY)
Admission: EM | Admit: 2013-04-09 | Discharge: 2013-04-11 | DRG: 309 | Disposition: A | Discharge status: Home / Self Care | Payer: Medicare HMO | Attending: Internal Medicine | Admitting: Internal Medicine

## 2013-04-09 ENCOUNTER — Emergency Department (HOSPITAL_COMMUNITY): Payer: Medicare HMO

## 2013-04-09 ENCOUNTER — Encounter (HOSPITAL_COMMUNITY): Payer: Self-pay | Admitting: Emergency Medicine

## 2013-04-09 DIAGNOSIS — R531 Weakness: Secondary | ICD-10-CM

## 2013-04-09 DIAGNOSIS — A318 Other mycobacterial infections: Secondary | ICD-10-CM | POA: Diagnosis present

## 2013-04-09 DIAGNOSIS — Z86711 Personal history of pulmonary embolism: Secondary | ICD-10-CM

## 2013-04-09 DIAGNOSIS — Z9089 Acquired absence of other organs: Secondary | ICD-10-CM

## 2013-04-09 DIAGNOSIS — Z9049 Acquired absence of other specified parts of digestive tract: Secondary | ICD-10-CM

## 2013-04-09 DIAGNOSIS — R55 Syncope and collapse: Secondary | ICD-10-CM

## 2013-04-09 DIAGNOSIS — I471 Supraventricular tachycardia, unspecified: Principal | ICD-10-CM | POA: Diagnosis present

## 2013-04-09 DIAGNOSIS — I2699 Other pulmonary embolism without acute cor pulmonale: Secondary | ICD-10-CM

## 2013-04-09 DIAGNOSIS — M1711 Unilateral primary osteoarthritis, right knee: Secondary | ICD-10-CM

## 2013-04-09 DIAGNOSIS — I472 Ventricular tachycardia, unspecified: Secondary | ICD-10-CM | POA: Diagnosis present

## 2013-04-09 DIAGNOSIS — Z79899 Other long term (current) drug therapy: Secondary | ICD-10-CM

## 2013-04-09 DIAGNOSIS — E7800 Pure hypercholesterolemia, unspecified: Secondary | ICD-10-CM | POA: Diagnosis present

## 2013-04-09 DIAGNOSIS — I2510 Atherosclerotic heart disease of native coronary artery without angina pectoris: Secondary | ICD-10-CM | POA: Diagnosis present

## 2013-04-09 DIAGNOSIS — J479 Bronchiectasis, uncomplicated: Secondary | ICD-10-CM

## 2013-04-09 DIAGNOSIS — Z01810 Encounter for preprocedural cardiovascular examination: Secondary | ICD-10-CM

## 2013-04-09 DIAGNOSIS — K5790 Diverticulosis of intestine, part unspecified, without perforation or abscess without bleeding: Secondary | ICD-10-CM

## 2013-04-09 DIAGNOSIS — I4729 Other ventricular tachycardia: Secondary | ICD-10-CM | POA: Diagnosis present

## 2013-04-09 DIAGNOSIS — J4489 Other specified chronic obstructive pulmonary disease: Secondary | ICD-10-CM | POA: Diagnosis present

## 2013-04-09 DIAGNOSIS — J449 Chronic obstructive pulmonary disease, unspecified: Secondary | ICD-10-CM | POA: Diagnosis present

## 2013-04-09 DIAGNOSIS — J8410 Pulmonary fibrosis, unspecified: Secondary | ICD-10-CM | POA: Diagnosis present

## 2013-04-09 DIAGNOSIS — Z86718 Personal history of other venous thrombosis and embolism: Secondary | ICD-10-CM

## 2013-04-09 DIAGNOSIS — A310 Pulmonary mycobacterial infection: Secondary | ICD-10-CM

## 2013-04-09 DIAGNOSIS — Z90710 Acquired absence of both cervix and uterus: Secondary | ICD-10-CM

## 2013-04-09 DIAGNOSIS — Z8249 Family history of ischemic heart disease and other diseases of the circulatory system: Secondary | ICD-10-CM

## 2013-04-09 DIAGNOSIS — K219 Gastro-esophageal reflux disease without esophagitis: Secondary | ICD-10-CM

## 2013-04-09 DIAGNOSIS — Z96659 Presence of unspecified artificial knee joint: Secondary | ICD-10-CM

## 2013-04-09 DIAGNOSIS — D62 Acute posthemorrhagic anemia: Secondary | ICD-10-CM

## 2013-04-09 DIAGNOSIS — Z8673 Personal history of transient ischemic attack (TIA), and cerebral infarction without residual deficits: Secondary | ICD-10-CM

## 2013-04-09 DIAGNOSIS — I10 Essential (primary) hypertension: Secondary | ICD-10-CM | POA: Diagnosis present

## 2013-04-09 DIAGNOSIS — R5383 Other fatigue: Secondary | ICD-10-CM

## 2013-04-09 DIAGNOSIS — K589 Irritable bowel syndrome without diarrhea: Secondary | ICD-10-CM | POA: Diagnosis present

## 2013-04-09 DIAGNOSIS — Z7901 Long term (current) use of anticoagulants: Secondary | ICD-10-CM

## 2013-04-09 DIAGNOSIS — I440 Atrioventricular block, first degree: Secondary | ICD-10-CM | POA: Diagnosis present

## 2013-04-09 LAB — BASIC METABOLIC PANEL
BUN: 16 mg/dL (ref 6–23)
CO2: 28 mEq/L (ref 19–32)
Chloride: 105 mEq/L (ref 96–112)
Creatinine, Ser: 1.08 mg/dL (ref 0.50–1.10)

## 2013-04-09 LAB — CBC WITH DIFFERENTIAL/PLATELET
Basophils Absolute: 0 10*3/uL (ref 0.0–0.1)
Basophils Relative: 1 % (ref 0–1)
HCT: 38.5 % (ref 36.0–46.0)
Lymphocytes Relative: 38 % (ref 12–46)
MCHC: 33 g/dL (ref 30.0–36.0)
Neutro Abs: 2.6 10*3/uL (ref 1.7–7.7)
Neutrophils Relative %: 50 % (ref 43–77)
Platelets: 170 10*3/uL (ref 150–400)
RDW: 14.7 % (ref 11.5–15.5)
WBC: 5.2 10*3/uL (ref 4.0–10.5)

## 2013-04-09 LAB — COMPREHENSIVE METABOLIC PANEL
ALT: 11 U/L (ref 0–35)
AST: 21 U/L (ref 0–37)
Albumin: 3.6 g/dL (ref 3.5–5.2)
Alkaline Phosphatase: 58 U/L (ref 39–117)
Calcium: 9.5 mg/dL (ref 8.4–10.5)
GFR calc Af Amer: 57 mL/min — ABNORMAL LOW (ref >90)
Glucose, Bld: 100 mg/dL — ABNORMAL HIGH (ref 70–99)
Potassium: 3.6 mEq/L (ref 3.5–5.1)
Sodium: 140 mEq/L (ref 135–145)
Total Protein: 6.1 g/dL (ref 6.0–8.3)

## 2013-04-09 LAB — PROTIME-INR: INR: 1.09 (ref 0.00–1.49)

## 2013-04-09 LAB — CBC
HCT: 40 % (ref 36.0–46.0)
Hemoglobin: 13.3 g/dL (ref 12.0–15.0)
MCV: 87 fL (ref 78.0–100.0)
RBC: 4.6 MIL/uL (ref 3.87–5.11)
WBC: 6.3 10*3/uL (ref 4.0–10.5)

## 2013-04-09 LAB — APTT: aPTT: 29 seconds (ref 24–37)

## 2013-04-09 MED ORDER — ONDANSETRON HCL 4 MG/2ML IJ SOLN: 4.0000 mg | Freq: Four times a day (QID) | INTRAMUSCULAR | Status: DC | PRN

## 2013-04-09 MED ORDER — RIVAROXABAN 20 MG PO TABS
20.0000 mg | ORAL_TABLET | Freq: Every day | ORAL | Status: DC
  Administered 2013-04-10: 20 mg via ORAL
  Filled 2013-04-09 (×2): qty 1

## 2013-04-09 MED ORDER — ACETAMINOPHEN 325 MG PO TABS
650.0000 mg | ORAL_TABLET | ORAL | Status: DC | PRN
  Administered 2013-04-10 – 2013-04-11 (×2): 650 mg via ORAL
  Filled 2013-04-09 (×2): qty 2

## 2013-04-09 MED ORDER — NEBIVOLOL HCL 5 MG PO TABS
5.0000 mg | ORAL_TABLET | Freq: Every day | ORAL | Status: DC
  Administered 2013-04-09: 5 mg via ORAL
  Filled 2013-04-09 (×2): qty 1

## 2013-04-09 MED ORDER — METOPROLOL TARTRATE 1 MG/ML IV SOLN: 2.5000 mg | Freq: Once | INTRAVENOUS | Status: DC

## 2013-04-09 MED ORDER — NITROGLYCERIN 0.4 MG SL SUBL: 0.4000 mg | SUBLINGUAL_TABLET | SUBLINGUAL | Status: DC | PRN

## 2013-04-09 MED ORDER — POTASSIUM CHLORIDE CRYS ER 20 MEQ PO TBCR
EXTENDED_RELEASE_TABLET | ORAL | Status: AC
  Filled 2013-04-09: qty 1

## 2013-04-09 MED ORDER — SODIUM CHLORIDE 0.9 % IV SOLN: 250.0000 mL | INTRAVENOUS | Status: DC | PRN

## 2013-04-09 MED ORDER — SODIUM CHLORIDE 0.9 % IJ SOLN
3.0000 mL | Freq: Two times a day (BID) | INTRAMUSCULAR | Status: DC
  Administered 2013-04-10 – 2013-04-11 (×3): 3 mL via INTRAVENOUS

## 2013-04-09 MED ORDER — POTASSIUM CHLORIDE CRYS ER 20 MEQ PO TBCR
20.0000 meq | EXTENDED_RELEASE_TABLET | Freq: Once | ORAL | Status: AC
  Administered 2013-04-09: 20 meq via ORAL

## 2013-04-09 MED ORDER — SODIUM CHLORIDE 0.9 % IJ SOLN: 3.0000 mL | INTRAMUSCULAR | Status: DC | PRN

## 2013-04-09 MED ORDER — ATORVASTATIN CALCIUM 20 MG PO TABS
20.0000 mg | ORAL_TABLET | Freq: Every day | ORAL | Status: DC
  Administered 2013-04-10: 20 mg via ORAL
  Filled 2013-04-09 (×2): qty 1

## 2013-04-09 NOTE — ED Provider Notes (Signed)
History     CSN: [id number]  Arrival date & time 04/09/13  1623   First MD Initiated Contact with Patient 04/09/13 1639      Chief Complaint  Patient presents with  . Chest Pain    (Consider location/radiation/quality/duration/timing/severity/associated sxs/prior treatment) HPI Comments: Patient with history of htn, hypercholesterolemia, arrhythmia.  Presents with complaints of tightness in her chest, feeling dizzy at church this afternoon.  She felt some palpitations in her chest which have since improved.  She was seen at [facility] and sent here for further workup.  She has seen Dr. Clide Cliff with [facility] in the past and there was some discussion of an ablation, however this did not happen due to other medical issues that were ongoing at the time (pneumonia).  Patient is a 79 y.o. female presenting with chest pain. The history is provided by the patient.  Chest Pain Pain location:  Substernal area Pain quality: tightness   Pain radiates to:  Does not radiate Pain radiates to the back: no   Pain severity:  Mild Onset quality:  Gradual Duration:  5 hours Timing:  Constant Progression:  Partially resolved Chronicity:  New Relieved by:  Nothing Worsened by:  Nothing tried Ineffective treatments:  None tried   Past Medical History  Diagnosis Date  . IBS (irritable bowel syndrome)   . Diverticulosis   . Colon polyps   . High cholesterol   . MI (myocardial infarction) 1975    Normal left ventricular function and nonischemic Myoview 2013  . GERD (gastroesophageal reflux disease)   . Hypertension   . Renal stone   . Bronchiectasis, non-tuberculous   . DVT (deep venous thrombosis)     Resolved, no anticoagulation currently  . 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 Palpitation     PACs and PVCs  . Pulmonary fibrosis   . First degree AV block     Conduction system  . Wide-complex tachycardia     I presume this is VT  . Pulmonary embolism     Following  knee surgery with a prior history  . COPD (chronic obstructive pulmonary disease)     past hx with pneumonia  . Stroke     Past Surgical History  Procedure Laterality Date  . Total abdominal hysterectomy    . Appendectomy    . Bladder tack    . Cholecystectomy    . Colon surgery      12inches colectomy s/p diverticulitis  . Tonsillectomy    . Total knee arthroplasty  12/12/2012    Procedure: TOTAL KNEE ARTHROPLASTY;  Surgeon: Harvie Junior, MD;  Location: [facility] OR;  Service: Orthopedics;  Laterality: Right;  right total knee arthroplasty    Family History  Problem Relation Age of Onset  . Stomach cancer Brother   . Heart disease Father   . Heart attack Mother   . Other Son     Triple Bypass    History  Substance Use Topics  . Smoking status: Never Smoker   . Smokeless tobacco: Never Used  . Alcohol Use: No    OB History   Grav Para Term Preterm Abortions TAB SAB Ect Mult Living                  Review of Systems  Cardiovascular: Positive for chest pain.  All other systems reviewed and are negative.    Allergies  Crestor and Darvon  Home Medications   Current Outpatient Rx  Name  Route  Sig  Dispense  Refill  . atorvastatin (LIPITOR) 20 MG tablet   Oral   Take 20 mg by mouth Daily.          . nebivolol (BYSTOLIC) 2.5 MG tablet   Oral   Take 2.5 mg by mouth daily.         . Rivaroxaban (XARELTO) 20 MG TABS   Oral   Take 20 mg by mouth daily.           BP 114/76  Temp(Src) 97.4 F (36.3 C) (Oral)  Resp 22  SpO2 98%  Physical Exam  Nursing note and vitals reviewed. Constitutional: She is oriented to person, place, and time. She appears well-developed and well-nourished. No distress.  HENT:  Head: Normocephalic and atraumatic.  Neck: Normal range of motion. Neck supple.  Cardiovascular: Normal rate and regular rhythm.  Exam reveals no gallop and no friction rub.   No murmur heard. Pulmonary/Chest: Effort normal and breath sounds normal. No  respiratory distress. She has no wheezes.  Abdominal: Soft. Bowel sounds are normal. She exhibits no distension. There is no tenderness.  Musculoskeletal: Normal range of motion.  Neurological: She is alert and oriented to person, place, and time.  Skin: Skin is warm and dry. She is not diaphoretic.    ED Course  Procedures (including critical care time)  Labs Reviewed  CBC  BASIC METABOLIC PANEL  POCT I-STAT TROPONIN I   No results found.   No diagnosis found.   Date: 04/09/2013  Rate: 114  Rhythm: sinus tachycardia  QRS Axis: left  Intervals: PR prolonged  ST/T Wave abnormalities: nonspecific ST changes  Conduction Disutrbances:first-degree A-V block   Narrative Interpretation:   Old EKG Reviewed: changes noted   Date: 04/09/2013  Rate: 69  Rhythm: normal sinus rhythm  QRS Axis: left  Intervals: PR prolonged  ST/T Wave abnormalities: normal  Conduction Disutrbances:first-degree A-V block   Narrative Interpretation:   Old EKG Reviewed: changes noted     MDM  The patient presents with complaints of chest tightness and shortness of breath since this afternoon.  Her initial ekg shows what appears to be some sort a wide complex tachycardia, what exact rhythm it is I am uncertain.  Her vitals are stable and I have discussed the case with Dr. Mayford Knife.  She recommended lopressor, however she spontaneously converted back to sinus rhythm with first degree avblock on her own.  She was seen by Dr. Mayford Knife and will be admitted to Cardiology service.          Geoffery Lyons, MD 04/09/13 [phone]

## 2013-04-09 NOTE — ED Notes (Signed)
Pt from urgent care, c/o cp and near syncope at church. Pt was transfer due to abnormal ekg. Pt received 324 ASA and 1 nitro w/no relief PTA. Pt alert and oriented

## 2013-04-09 NOTE — ED Notes (Signed)
MD at bedside. 

## 2013-04-09 NOTE — H&P (Signed)
Admit date: 04/09/2013 Referring Physician Dr. Judd Lien Primary Cardiologist  Dr. [name] Chief complaint/reason for admission:dizziness and palpitations.  HPI: This is an 79yo WF with a history of HTN, wide complex tachycardia, PE and COPD who presented to the ER with complaints of palpitations and dizziness.  This started a few days ago and was intermittent but today it became more frequent and severe.  She said that today her heart started pounding real fast and got nauseated and had presyncope when she stood up. She felt a pressure sensation in her chest as though someone was sitting on it and she had SOB.  In the ER she was in a wide complex tachycardia at 114bpm and was given IV lopressor 2.5mg  and heart rate came down to 73bpm with a first degree AV block and no change in QRS complex c/w nonspecific IVCD.  Currently she complains of 1-2 chest pressure.    PMH:    Past Medical History  Diagnosis Date  . IBS (irritable bowel syndrome)   . Diverticulosis   . Colon polyps   . High cholesterol   . MI (myocardial infarction) 1975    Normal left ventricular function and nonischemic Myoview 2013  . GERD (gastroesophageal reflux disease)   . Hypertension   . Renal stone   . Bronchiectasis, non-tuberculous   . DVT (deep venous thrombosis)     Resolved, no anticoagulation currently  . 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 Palpitation     PACs and PVCs  . Pulmonary fibrosis   . First degree AV block     Conduction system  . Wide-complex tachycardia     I presume this is VT  . Pulmonary embolism     Following knee surgery with a prior history  . COPD (chronic obstructive pulmonary disease)     past hx with pneumonia  . Stroke     PSH:    Past Surgical History  Procedure Laterality Date  . Total abdominal hysterectomy    . Appendectomy    . Bladder tack    . Cholecystectomy    . Colon surgery      12inches colectomy s/p diverticulitis  . Tonsillectomy    . Total  knee arthroplasty  12/12/2012    Procedure: TOTAL KNEE ARTHROPLASTY;  Surgeon: Harvie Junior, MD;  Location: [facility] OR;  Service: Orthopedics;  Laterality: Right;  right total knee arthroplasty    ALLERGIES:   Crestor and Darvon  Prior to Admit Meds:   (Not in a hospital admission) Family HX:    Family History  Problem Relation Age of Onset  . Stomach cancer Brother   . Heart disease Father   . Heart attack Mother   . Other Son     Triple Bypass   Social HX:    History   Social History  . Marital Status: Married    Spouse Name: N/A    Number of Children: 3  . Years of Education: N/A   Occupational History  . Retired     [employer]   Social History Main Topics  . Smoking status: Never Smoker   . Smokeless tobacco: Never Used  . Alcohol Use: No  . Drug Use: No  . Sexually Active: Not on file   Other Topics Concern  . Not on file   Social History Narrative  . No narrative on file     ROS:  All 11 ROS were addressed and are negative except what is stated in the  HPI  PHYSICAL EXAM Filed Vitals:   04/09/13 1830  BP: 138/58  Pulse: 72  Temp:   Resp: 17   General: Well developed, well nourished, in no acute distress Head: Eyes PERRLA, No xanthomas.   Normal cephalic and atramatic  Lungs:   Clear bilaterally to auscultation and percussion. Heart:   HRRR S1 S2 Pulses are 2+ & equal.            No carotid bruit. No JVD.  No abdominal bruits. No femoral bruits. Abdomen: Bowel sounds are positive, abdomen soft and non-tender without masses  Extremities:   No clubbing, cyanosis or edema.  DP +1 Neuro: Alert and oriented X 3. Psych:  Good affect, responds appropriately   Labs:   Lab Results  Component Value Date   WBC 6.3 04/09/2013   HGB 13.3 04/09/2013   HCT 40.0 04/09/2013   MCV 87.0 04/09/2013   PLT 172 04/09/2013    Recent Labs Lab 04/09/13 1645  NA 141  K 3.6  CL 105  CO2 28  BUN 16  CREATININE 1.08  CALCIUM 10.0  GLUCOSE 112*   No results found  for this basename: CKTOTAL, CKMB, CKMBINDEX, TROPONINI   No results found for this basename: PTT   Lab Results  Component Value Date   INR 1.05 12/06/2012         Radiology:  *RADIOLOGY REPORT*  Clinical Data: Shortness of breath with chest pain, cough and  weakness.  PORTABLE CHEST - 1 VIEW  Comparison: 01/18/2013 radiographs. 12/16/2012 CT.  Findings: 1650 hours. Cardiomegaly and aortic atherosclerosis are  stable. There is grossly stable right upper lobe subpleural  nodularity. No confluent airspace opacity or pleural effusion is  seen. Mild blunting of the left costophrenic angle appears stable.  The osseous structures appear stable. Telemetry leads overlie the  chest.  IMPRESSION:  Stable chronic findings. No acute cardiopulmonary process  identified.  Original Report Authenticated By: Carey Bullocks, M.D.    EKG #1: wide complex tachycardia at 114bpm  EKG #2: NSR with long first degree AV block and resolution of wide QRS   ASSESSMENT:  1.  Wide complex tachycardia ? Slow VT vs. Tachycardia induced BBB 2.  Chest pain secondary to #1 now improved after IV Lopressor - initial troponin normal 3.  History of remote MI and no ischemia and normal LVF on myoview 2013 4.  HTN 5.  History of DVT and PE in the past 6.  Borderline low potassium  PLAN:   1.  Admit to tele bed 2.  Cycle cardiac enzymes 3.  Replete potassium 4.  Continue beta blocker/statin/Xarelto 5.  Check TSH 6.  Increase Bystolic to 5mg  daily 7.  Further workup per Dr. [name] - ? EP study 8.  NPO after MN  Quintella Reichert, MD  04/09/2013  6:56 PM

## 2013-04-09 NOTE — ED Notes (Signed)
Report Attempted

## 2013-04-10 ENCOUNTER — Encounter (HOSPITAL_COMMUNITY): Payer: Self-pay | Admitting: Internal Medicine

## 2013-04-10 DIAGNOSIS — I472 Ventricular tachycardia: Secondary | ICD-10-CM

## 2013-04-10 LAB — BASIC METABOLIC PANEL
BUN: 17 mg/dL (ref 6–23)
Chloride: 105 mEq/L (ref 96–112)
GFR calc Af Amer: 64 mL/min — ABNORMAL LOW (ref >90)
GFR calc non Af Amer: 56 mL/min — ABNORMAL LOW (ref >90)
Glucose, Bld: 115 mg/dL — ABNORMAL HIGH (ref 70–99)
Potassium: 3.8 mEq/L (ref 3.5–5.1)
Sodium: 139 mEq/L (ref 135–145)

## 2013-04-10 LAB — T4, FREE: Free T4: 0.97 ng/dL (ref 0.80–1.80)

## 2013-04-10 LAB — TROPONIN I
Troponin I: <0.3 ng/mL (ref <0.30)
Troponin I: <0.3 ng/mL (ref <0.30)

## 2013-04-10 LAB — TSH: TSH: 1.767 u[IU]/mL (ref 0.350–4.500)

## 2013-04-10 MED ORDER — NEBIVOLOL HCL 5 MG PO TABS
7.5000 mg | ORAL_TABLET | Freq: Every day | ORAL | Status: DC
  Administered 2013-04-11: 7.5 mg via ORAL
  Filled 2013-04-10: qty 1

## 2013-04-10 NOTE — Progress Notes (Signed)
Patient: [name] [name] Date of Encounter: 04/10/2013, 8:58 AM Admit date: 04/09/2013     Subjective  [name] [name] has no new complaints this AM. She denies CP, SOB or palpitations.    Objective  Physical Exam: Vitals: BP 154/75  Pulse 53  Temp(Src) 97.8 F (36.6 C) (Oral)  Resp 14  Ht 5' 6.14" (1.68 m)  Wt 151 lb 3.8 oz (68.6 kg)  BMI 24.31 kg/m2  SpO2 100% General: Well developed, well appearing 79 year old female in no acute distress. Neck: Supple. JVD not elevated. Lungs: Clear bilaterally to auscultation without wheezes, rales, or rhonchi. Breathing is unlabored. Heart: RRR S1 S2 without murmurs, rubs, or gallops.  Abdomen: Soft, non-distended. Extremities: No clubbing or cyanosis. No edema.  Distal pedal pulses are 2+ and equal bilaterally. Neuro: Alert and oriented X 3. Moves all extremities spontaneously. No focal deficits.  Intake/Output:  Intake/Output Summary (Last 24 hours) at 04/10/13 0858 Last data filed at 04/10/13 0430  Gross per 24 hour  Intake    240 ml  Output    800 ml  Net   -560 ml    Inpatient Medications:  . atorvastatin  20 mg Oral q1800  . nebivolol  5 mg Oral Daily  . Rivaroxaban  20 mg Oral Q supper  . sodium chloride  3 mL Intravenous Q12H    Labs:  Recent Labs  04/09/13 1645 04/09/13 2045 04/10/13 0350  NA 141 140 139  K 3.6 3.6 3.8  CL 105 104 105  CO2 28 27 23   GLUCOSE 112* 100* 115*  BUN 16 17 17   CREATININE 1.08 1.00 0.91  CALCIUM 10.0 9.5 9.3  MG  --  1.9  --     Recent Labs  04/09/13 2045  AST 21  ALT 11  ALKPHOS 58  BILITOT 0.4  PROT 6.1  ALBUMIN 3.6    Recent Labs  04/09/13 1645 04/09/13 2045  WBC 6.3 5.2  NEUTROABS  --  2.6  HGB 13.3 12.7  HCT 40.0 38.5  MCV 87.0 87.1  PLT 172 170    Recent Labs  04/09/13 2043 04/10/13 0215  TROPONINI <0.30 <0.30    Recent Labs  04/09/13 2045  INR 1.09    Radiology/Studies: Dg Chest Port 1 View  04/09/2013   *RADIOLOGY REPORT*  Clinical Data:  Shortness of breath with chest pain, cough and weakness.  PORTABLE CHEST - 1 VIEW  Comparison: 01/18/2013 radiographs.  12/16/2012 CT.  Findings: 1650 hours.  Cardiomegaly and aortic atherosclerosis are stable.  There is grossly stable right upper lobe subpleural nodularity.  No confluent airspace opacity or pleural effusion is seen.  Mild blunting of the left costophrenic angle appears stable. The osseous structures appear stable.  Telemetry leads overlie the chest.  IMPRESSION: Stable chronic findings.  No acute cardiopulmonary process identified.   Original Report Authenticated By: Carey Bullocks, M.D.    Telemetry: sinus brady with long first degree AV block    Assessment and Plan  1. Dizziness and palpitations, correlating to [facility] on telemetry 2. Paroxysmal VT 3. History of frequent PACs and nonsustained atrial arrhythmias 4. Prior PE Sept 2013, on Xarelto 5. Prior CVA 6. Pulmonary fibrosis/MAC 7. CAD 8. Intolerant to multiple BBs due to fatigue 9. Normal LV function, EF 60-65%, by echo Feb 2014  According to Dr. [name] most recent office note, [name] [name] was in the hospital in April 2013 and had significant symptoms associated with wide-complex tachycardia for which she saw  Dr. [name]. Catheter ablation was recommended as opposed amiodarone. Of note, her strips were reviewed at that time and were felt to represent VT (per SK, "I think if these represent ventricular tachycardia. Her PR interval at baseline is 300-400 ms and PR interval is associated with PVCs that looked very much like the morphology of these beats is a short as 100 ms suggesting isorhythmic dissociation and biventricular pattern"). Because of insurance issues she did not have an ablation. She followed up with Dr. [name] Jan 2014. She did not tolerate amiodarone. Dr. [name] prescribed beta blockers at her last visit to see if she would tolerate low dose. She is currently on Bystolic.  Dr. [name] to see Signed, Rick Duff  PA-C

## 2013-04-10 NOTE — Progress Notes (Signed)
BE and I have looked at these The QRSd 134; initial deflections in all leads similar to sinus and 1:1 AV evident in lead V2 with PR interval and transtion strips MEDIA folder 02/23/2012 make me mostly convinced that this is a long RP SVT, prob Atach based on P wave vector.  Medical options are limited given conduction system disease.  For now will continue bystolic and increase to 7.5 and anticipate discharge in am   Will review wi colleagues consideration of RFCA for ATach

## 2013-04-11 ENCOUNTER — Inpatient Hospital Stay (HOSPITAL_COMMUNITY): Payer: Medicare HMO

## 2013-04-11 MED ORDER — NEBIVOLOL HCL 5 MG PO TABS: 7.5000 mg | ORAL_TABLET | Freq: Every day | ORAL | Status: DC

## 2013-04-11 NOTE — Discharge Summary (Signed)
ELECTROPHYSIOLOGY DISCHARGE SUMMARY    Patient ID: [name] [name],  MRN: [id number], DOB/AGE: [birth year] 77 y.o.  Admit date: 04/09/2013 Discharge date: 04/11/2013  Primary Care Physician: [name], MD Primary Cardiologist: Sherryl Manges, MD  Primary Discharge Diagnosis:  1. Long RP tachycardia, most likely atrial tachycardia  Secondary Discharge Diagnoses:  1. History of frequent PACs and nonsustained atrial arrhythmias  2. Prior PE Sept 2013, on Xarelto  3. Prior CVA  4. Pulmonary fibrosis/MAC  5. CAD  6. Intolerant to multiple BBs due to fatigue  7. Normal LV function, EF 60-65%, by echo Feb 2014  Procedures This Admission: 1. Head CT - 04/11/2013 - no acute intracranial hemorrhage  History and Hospital Course:  [name] [name] is a pleasant 79 year old woman with the above problem list who presented to [facility] with dizziness and palpitations. Her symptoms started 2 days prior to admission and were occurring intermittently; however, on the day of admission her symptoms recurred and were persistent, prompting her to seek medical attention. She has had similar symptoms in the past. She has not tolerated amiodarone or several BBs. She is currently taking Bystolic. On admission her 12-lead ECG revealed a long RP tachycardia, most likely an atrial tachycardia. Bystolic was increased to 7.5 mg once daily. She was observed on telemetry without recurrence. Today she developed a headache. Head CT was ordered which was negative for any acute intracranial abnormality. She is ambulating without difficulty. She remains hemodynamically stable. She has been seen, examined and deemed stable for discharge today by Dr. Berton Mount. She will follow-up in clinic in 6 weeks.  Discharge Vitals: Blood pressure 155/54, pulse 58, temperature 98.2 F (36.8 C), temperature source Oral, resp. rate 16, height 5' 6.14" (1.68 m), weight 151 lb 3.8 oz (68.6 kg), SpO2 97.00%.   Labs: Lab Results  Component Value Date     WBC 5.2 04/09/2013   HGB 12.7 04/09/2013   HCT 38.5 04/09/2013   MCV 87.1 04/09/2013   PLT 170 04/09/2013     Recent Labs Lab 04/09/13 2045 04/10/13 0350  NA 140 139  K 3.6 3.8  CL 104 105  CO2 27 23  BUN 17 17  CREATININE 1.00 0.91  CALCIUM 9.5 9.3  PROT 6.1  --   BILITOT 0.4  --   ALKPHOS 58  --   ALT 11  --   AST 21  --   GLUCOSE 100* 115*   Lab Results  Component Value Date   TROPONINI <0.30 04/10/2013     Recent Labs  04/09/13 2045  INR 1.09    Disposition:  The patient is being discharged in stable condition.  Follow-up:     Follow-up Information   Follow up with Sherryl Manges, MD On 05/25/2013. (At 10:30 AM)    Contact information:   [address] [place] [address] [phone]     Discharge Medications:    Medication List    TAKE these medications       atorvastatin 20 MG tablet  Commonly known as:  LIPITOR  Take 20 mg by mouth Daily.     nebivolol 5 MG tablet  Commonly known as:  BYSTOLIC  Take 1.5 tablets (7.5 mg total) by mouth daily.     XARELTO 20 MG Tabs  Generic drug:  Rivaroxaban  Take 20 mg by mouth daily.       Duration of Discharge Encounter: Greater than 30 minutes including physician time.  Signed, Rick Duff, PA-C  04/11/2013, 2:43 PM

## 2013-04-11 NOTE — Progress Notes (Signed)
Ambulated along the hallway for 10 min. Tolerated well. Post ambulation v/s- bp 155/54,hr-58, r-16, o2 sat 98 % on room air. No chest pain nor sob presented.

## 2013-04-11 NOTE — Progress Notes (Signed)
Discharged home, accompanied by spouse, stable, discharge instructions given understood well, belongings with pt.

## 2013-04-11 NOTE — Progress Notes (Addendum)
Patient: [name] [name] Date of Encounter: 04/11/2013, 7:21 AM Admit date: 04/09/2013     Subjective  [name] [name] has no new complaints this AM. She denies CP, SOB or palpitations. She is eager to go home.   Objective  Physical Exam: Vitals: BP 145/67  Pulse 59  Temp(Src) 97.9 F (36.6 C) (Oral)  Resp 16  Ht 5' 6.14" (1.68 m)  Wt 151 lb 3.8 oz (68.6 kg)  BMI 24.31 kg/m2  SpO2 94% General: Well developed, well appearing 79 year old female in no acute distress. Neck: Supple. JVD not elevated. Lungs: Clear bilaterally to auscultation without wheezes, rales, or rhonchi. Breathing is unlabored. Heart: RRR S1 S2 without murmurs, rubs, or gallops.  Abdomen: Soft, non-distended. Extremities: No clubbing or cyanosis. No edema.  Distal pedal pulses are 2+ and equal bilaterally. Neuro: Alert and oriented X 3. Moves all extremities spontaneously. No focal deficits.  Intake/Output:  Intake/Output Summary (Last 24 hours) at 04/11/13 0721 Last data filed at 04/11/13 0000  Gross per 24 hour  Intake    303 ml  Output   1000 ml  Net   -697 ml    Inpatient Medications:  . atorvastatin  20 mg Oral q1800  . nebivolol  7.5 mg Oral Daily  . Rivaroxaban  20 mg Oral Q supper  . sodium chloride  3 mL Intravenous Q12H    Labs:  Recent Labs  04/09/13 1645 04/09/13 2045 04/10/13 0350  NA 141 140 139  K 3.6 3.6 3.8  CL 105 104 105  CO2 28 27 23   GLUCOSE 112* 100* 115*  BUN 16 17 17   CREATININE 1.08 1.00 0.91  CALCIUM 10.0 9.5 9.3  MG  --  1.9  --     Recent Labs  04/09/13 2045  AST 21  ALT 11  ALKPHOS 58  BILITOT 0.4  PROT 6.1  ALBUMIN 3.6    Recent Labs  04/09/13 1645 04/09/13 2045  WBC 6.3 5.2  NEUTROABS  --  2.6  HGB 13.3 12.7  HCT 40.0 38.5  MCV 87.0 87.1  PLT 172 170    Recent Labs  04/09/13 2043 04/10/13 0215 04/10/13 0825  TROPONINI <0.30 <0.30 <0.30    Recent Labs  04/09/13 2045  INR 1.09    Radiology/Studies: Dg Chest Port 1  View  04/09/2013   *RADIOLOGY REPORT*  Clinical Data: Shortness of breath with chest pain, cough and weakness.  PORTABLE CHEST - 1 VIEW  Comparison: 01/18/2013 radiographs.  12/16/2012 CT.  Findings: 1650 hours.  Cardiomegaly and aortic atherosclerosis are stable.  There is grossly stable right upper lobe subpleural nodularity.  No confluent airspace opacity or pleural effusion is seen.  Mild blunting of the left costophrenic angle appears stable. The osseous structures appear stable.  Telemetry leads overlie the chest.  IMPRESSION: Stable chronic findings.  No acute cardiopulmonary process identified.   Original Report Authenticated By: Carey Bullocks, M.D.    Telemetry: sinus brady with long first degree AV block    Assessment and Plan  1. Symptomatic PSVT - nebivolol dose increased yesterday and seems to be tolerating well; will ambulate this AM and plan to DC later today 2. History of frequent PACs and nonsustained atrial arrhythmias 3. Prior PE Sept 2013, on Xarelto 4. Prior CVA 5. Pulmonary fibrosis/MAC 6. CAD 7. Intolerant to multiple BBs due to fatigue 8. Normal LV function, EF 60-65%, by echo Feb 2014  Dr. [name] to see Signed, Rick Duff PA-C  As  above Will review tracings with Dr Leonia Reeves and will arranvge folowup with him if she has recurrences  Also complaining of new onset headache on Rivaroxaban Will get Ctr prior to discharge

## 2013-04-13 ENCOUNTER — Telehealth: Payer: Self-pay | Admitting: Internal Medicine

## 2013-04-13 ENCOUNTER — Other Ambulatory Visit: Payer: Self-pay | Admitting: Nurse Practitioner

## 2013-04-13 DIAGNOSIS — I472 Ventricular tachycardia: Secondary | ICD-10-CM

## 2013-04-13 MED ORDER — NEBIVOLOL HCL 10 MG PO TABS: 10.0000 mg | ORAL_TABLET | Freq: Every day | ORAL | Status: DC

## 2013-04-13 MED ORDER — NEBIVOLOL HCL 5 MG PO TABS: ORAL_TABLET | ORAL | Status: DC

## 2013-04-13 NOTE — Telephone Encounter (Signed)
Discussed the patient's bystolic with Dr. [name]. He is aware her insurance will cover no more than 30 pills on her RX. Per Dr. [name], the patient does not tolerate other beta blockers and has a borderline low HR. He will try her on bystolic 10 mg once daily. She will need to monitor her HR closely. I have notified the pharmacy of the update in her RX. I have attempted to contact the patient regarding her medication dose change and to monitor her HR. No answer and no machine. I will forward to triage to please call her tomorrow to make sure she is aware of the dose change and to monitor her HR.

## 2013-04-13 NOTE — Telephone Encounter (Signed)
New Prob     Pt states prescription for NEBIVOLOL was sent to the pharmacy, however, she states they will not fill it. Would like to speak to nurse regarding this.

## 2013-04-13 NOTE — Telephone Encounter (Signed)
Will forward to Dr. [name] for review and to make a decision.

## 2013-04-13 NOTE — Telephone Encounter (Signed)
Spoke with patient who states Rx was called in and pharmacist told her insurance will only pay for 30 pills of Nebivolol 5 mg.  Patient takes 7.5 mg daily so she needs 45 pills per month.  Pharmacy is [facility].  Patient wants Dr. [name] opinion on whether or not she should try something else.  I advised patient that I will speak with him in person as he is in the office today and will call her back.

## 2013-04-13 NOTE — Telephone Encounter (Signed)
New  Problem  Jamilyn from [facility] called. She said they received a script of BYSTOLIC for this patient and her insurance will not cover the patient taking two 5 mg pills a day. Her insurance will only cover 1 pill a day. She asked if you could call her back.

## 2013-04-13 NOTE — Telephone Encounter (Signed)
I spoke with Dr. [name] regarding the patient's medication. He recommends witting the RX for nebivolol 5 mg one tablet twice daily to see if her insurance will dispense the needed pills. He would still want her to only take 1 & 1/2 tablets daily. I have notified the patient and she verbalizes understanding. I have advised I will send her RX to [facility] and for her to call with any further problems. She is agreeable.

## 2013-04-13 NOTE — Telephone Encounter (Signed)
Will forward to [facility] to discuss with Dr [name]

## 2013-04-14 NOTE — Telephone Encounter (Signed)
Patient not home, will try again later this am

## 2013-04-14 NOTE — Telephone Encounter (Signed)
Follow-up: ° ° ° °Patient called in returning your call.  Please call back. °

## 2013-04-14 NOTE — Telephone Encounter (Signed)
Left message to call back  

## 2013-04-14 NOTE — Telephone Encounter (Signed)
Did speak to her earlier today and gave recommendations of monitoring heart rate. Stated she saw her PCP earlier today and he suggested cutting Bystolic in half. Advised would discuss with Kennon Rounds pharmacist. Discussed and triangular shaped so would be difficult to half. Tried several times to call patient back, left message once and other 3 times no answer.

## 2013-04-17 NOTE — Addendum Note (Signed)
Addended by: Regis Bill B on: 04/17/2013 03:33 PM   Modules accepted: Orders

## 2013-04-17 NOTE — Telephone Encounter (Signed)
Followed up with patient today and as per her PCP recommendations she is cutting her Bystolic in half. Did advise of recommendation but states her pill cutter does a good job of cutting pill. Advised to call if any problems

## 2013-05-25 ENCOUNTER — Ambulatory Visit: Payer: Medicare HMO | Admitting: Internal Medicine

## 2013-06-22 ENCOUNTER — Ambulatory Visit (INDEPENDENT_AMBULATORY_CARE_PROVIDER_SITE_OTHER): Payer: Medicare HMO | Admitting: Internal Medicine

## 2013-06-22 ENCOUNTER — Encounter: Payer: Self-pay | Admitting: Internal Medicine

## 2013-06-22 VITALS — BP 153/74 | HR 90 | Ht 66.5 in | Wt 148.2 lb

## 2013-06-22 DIAGNOSIS — I25810 Atherosclerosis of coronary artery bypass graft(s) without angina pectoris: Secondary | ICD-10-CM

## 2013-06-22 DIAGNOSIS — I472 Ventricular tachycardia: Secondary | ICD-10-CM

## 2013-06-22 DIAGNOSIS — I10 Essential (primary) hypertension: Secondary | ICD-10-CM

## 2013-06-22 DIAGNOSIS — R600 Localized edema: Secondary | ICD-10-CM

## 2013-06-22 HISTORY — DX: Localized edema: R60.0

## 2013-06-22 LAB — BASIC METABOLIC PANEL
BUN: 14 mg/dL (ref 6–23)
CO2: 28 mEq/L (ref 19–32)
Calcium: 9.4 mg/dL (ref 8.4–10.5)
Creatinine, Ser: 0.9 mg/dL (ref 0.4–1.2)
GFR: 62.93 mL/min (ref >60.00)
Glucose, Bld: 89 mg/dL (ref 70–99)

## 2013-06-22 LAB — CBC WITH DIFFERENTIAL/PLATELET
Basophils Relative: 1.1 % (ref 0.0–3.0)
Eosinophils Absolute: 0.1 10*3/uL (ref 0.0–0.7)
Eosinophils Relative: 2.4 % (ref 0.0–5.0)
HCT: 40.4 % (ref 36.0–46.0)
Lymphs Abs: 1.5 10*3/uL (ref 0.7–4.0)
MCHC: 33.9 g/dL (ref 30.0–36.0)
MCV: 90 fl (ref 78.0–100.0)
Monocytes Absolute: 0.5 10*3/uL (ref 0.1–1.0)
Neutro Abs: 3.1 10*3/uL (ref 1.4–7.7)
Neutrophils Relative %: 58.3 % (ref 43.0–77.0)
RBC: 4.49 Mil/uL (ref 3.87–5.11)
WBC: 5.4 10*3/uL (ref 4.5–10.5)

## 2013-06-22 MED ORDER — HYDROCHLOROTHIAZIDE 25 MG PO TABS: ORAL_TABLET | ORAL | Status: DC

## 2013-06-22 NOTE — Assessment & Plan Note (Signed)
We will add low-dose when necessary diuretic. We will check a metabolic profile today prior to its initiation.

## 2013-06-22 NOTE — Progress Notes (Signed)
No SX @ 2 mins 

## 2013-06-22 NOTE — Progress Notes (Addendum)
No SX @ 5 mins 

## 2013-06-22 NOTE — Progress Notes (Signed)
Patient Care Team: [name], MD as PCP - General (Unknown Physician Specialty) Storm Frisk, MD as Attending Physician (Pulmonary Disease)   HPI  [name] [name] is a 79 y.o. female Seen in followup for palpitations identified as PACs and nonsustained atrial arrhythmias. While in hospital in April she had significant symptoms associated with wide-complex tachycardia for which she saw Dr. [name]; catheter ablation was recommended as opposed amiodarone.  Because of insurance issues she came to see [place].  It was my impression on review it represented right ventricular tachycardia. She did not tolerate amiodarone. I gave her to her beta blockers at her last visit to see if she would do using low doses because of a baseline PR interval of 300-400 ms.  Myoview was normal; there were no perfusion defects not withstanding her prior history of myocardial infarction  Catheterization 2011 [place] [facility]-no obstructive coronary disease  The beta blockers initially had a good impact on her heart rhythm.  She has had infrequent palpitations.  There has been some modification of her medications per Dr. Mariana Kaufman.  Her Rivaroxaban has been stopped. She continued to have spells small red lesions on her lower extremities.   Echocardiogram 2010-[place] [facility]-no evidence of left ventricular hypertrophy  Past Medical History      Past Medical History  Diagnosis Date  . IBS (irritable bowel syndrome)   . Diverticulosis   . Colon polyps   . High cholesterol   . MI (myocardial infarction) ?? probably not 1975    Normal left ventricular function; Nonischemic Myoview 2013; Cath neg 2011  . GERD (gastroesophageal reflux disease)   . Hypertension   . Renal stone   . Bronchiectasis, non-tuberculous   . DVT (deep venous thrombosis)     Resolved, no anticoagulation currently  . 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 Palpitation     PACs and PVCs  . Pulmonary fibrosis   .  First degree AV block     Conduction system  . Wide-complex tachycardia     I think this is VT, but i am not sure QRSd 135 1:1 AV  . Pulmonary embolism     Following knee surgery with a prior history  . COPD (chronic obstructive pulmonary disease)     past hx with pneumonia  . Stroke     Past Surgical History  Procedure Laterality Date  . Total abdominal hysterectomy    . Appendectomy    . Bladder tack    . Cholecystectomy    . Colon surgery      12inches colectomy s/p diverticulitis  . Tonsillectomy    . Total knee arthroplasty  12/12/2012    Procedure: TOTAL KNEE ARTHROPLASTY;  Surgeon: Harvie Junior, MD;  Location: [facility] OR;  Service: Orthopedics;  Laterality: Right;  right total knee arthroplasty    Current Outpatient Prescriptions  Medication Sig Dispense Refill  . atorvastatin (LIPITOR) 20 MG tablet Take 20 mg by mouth Daily.       . nebivolol (BYSTOLIC) 10 MG tablet Take 10 mg by mouth as directed. 1/2 tablet daily       No current facility-administered medications for this visit.    Allergies  Allergen Reactions  . Crestor [Rosuvastatin] Other (See Comments)    Myalgia  . Darvon     Drop in  BP    Review of Systems negative except from HPI and PMH  Physical Exam BP 153/74  Pulse 90  Ht 5' 6.5" (1.689 m)  Wt 148 lb  3.2 oz (67.223 kg)  BMI 23.56 kg/m2 Well developed and well nourished in no acute distress HENT normal E scleral and icterus clear Neck Supple JVP flat; carotids brisk and full Clear to ausculation  Regular rate and rhythm, no murmurs gallops or rub Soft with active bowel sounds No clubbing cyanosis Trace Edema Alert and oriented, grossly normal motor and sensory function Skin Warm and Dry; small erythematous lesion extremities  ECG was not taken.  Assessment and  Plan

## 2013-06-22 NOTE — Assessment & Plan Note (Signed)
Presumed ventricular origin; much improved on beta blockers.

## 2013-06-22 NOTE — Assessment & Plan Note (Signed)
Stable Without chest pain 

## 2013-06-22 NOTE — Assessment & Plan Note (Addendum)
We will increase her Bystolic 5--10 mg daily. This will also help with her heart rate. I'm not sure what her heart rate is up from the 50s to look for 2 ago. We will check thyroid and CBC today

## 2013-06-22 NOTE — Assessment & Plan Note (Signed)
No recurrent syncope 

## 2013-06-22 NOTE — Patient Instructions (Signed)
Your physician has recommended you make the following change in your medication:  1) Increase Bystolic to 10mg  by mouth daily 2) Start Hydrochlorathizide (HCTZ) 25mg  (one half tablet) by mouth daily as needed  Your physician recommends that you have lab work today: CBC, TSH, BMET  Your physician wants you to follow-up in: 6 months with Dr. [name]. You will receive a reminder letter in the mail two months in advance. If you don't receive a letter, please call our office to schedule the follow-up appointment.

## 2013-07-04 ENCOUNTER — Other Ambulatory Visit: Payer: Self-pay | Admitting: *Deleted

## 2013-07-04 DIAGNOSIS — I83893 Varicose veins of bilateral lower extremities with other complications: Secondary | ICD-10-CM

## 2013-07-04 DIAGNOSIS — M79609 Pain in unspecified limb: Secondary | ICD-10-CM

## 2013-08-15 ENCOUNTER — Encounter: Payer: Medicare HMO | Admitting: Vascular Surgery

## 2013-08-15 ENCOUNTER — Encounter: Payer: Self-pay | Admitting: Vascular Surgery

## 2013-08-16 ENCOUNTER — Encounter: Payer: Self-pay | Admitting: Vascular Surgery

## 2013-08-16 ENCOUNTER — Ambulatory Visit (HOSPITAL_COMMUNITY)
Admission: RE | Admit: 2013-08-16 | Discharge: 2013-08-16 | Disposition: A | Discharge status: Home / Self Care | Payer: Medicare HMO | Source: Ambulatory Visit | Attending: Vascular Surgery | Admitting: Vascular Surgery

## 2013-08-16 ENCOUNTER — Encounter (INDEPENDENT_AMBULATORY_CARE_PROVIDER_SITE_OTHER): Payer: Self-pay

## 2013-08-16 ENCOUNTER — Ambulatory Visit (INDEPENDENT_AMBULATORY_CARE_PROVIDER_SITE_OTHER): Payer: Medicare HMO | Admitting: Vascular Surgery

## 2013-08-16 VITALS — BP 169/60 | HR 55 | Resp 18 | Ht 66.5 in | Wt 148.6 lb

## 2013-08-16 DIAGNOSIS — M79609 Pain in unspecified limb: Secondary | ICD-10-CM | POA: Insufficient documentation

## 2013-08-16 DIAGNOSIS — I83893 Varicose veins of bilateral lower extremities with other complications: Secondary | ICD-10-CM | POA: Insufficient documentation

## 2013-08-16 HISTORY — DX: Pain in unspecified limb: M79.609

## 2013-08-16 HISTORY — DX: Varicose veins of bilateral lower extremities with other complications: I83.893

## 2013-08-16 NOTE — Progress Notes (Signed)
[facility] [place]  Patient name: ASHBY LEFLORE MRN: [id number] DOB: 07/29/1924 Sex: female  REASON FOR CONSULT: pain behind right knee with varicose veins. Referred by Dr. Jodi Geralds.  HPI: SKYAH HANNON is a 79 y.o. female who underwent a right total knee replacement in February of this year. She's been having some pain behind her posterior right knee. She describes aching pain which usually occurs at night when she is sleeping. She is very active and goes to the [facility] where she uses an exercise bike. He states that this actually makes the pain feel better. She does have some varicose vein in the posterior aspect of her right leg and also on the anterior aspect of her right leg. She was sent for vascular consultation. She has had a previous history of DVT in the past but cannot remember the details. It sounds like she also had a PE in the past.  I do not get any history of claudication, rest pain, or nonhealing ulcers. She did very well after her total knee replacement on the right.  Past Medical History  Diagnosis Date  . IBS (irritable bowel syndrome)   . Diverticulosis   . Colon polyps   . High cholesterol   . MI (myocardial infarction) ?? probably not 1975    Normal left ventricular function; Nonischemic Myoview 2013; Cath neg 2011  . GERD (gastroesophageal reflux disease)   . Hypertension   . Renal stone   . Bronchiectasis, non-tuberculous   . DVT (deep venous thrombosis)     Resolved, no anticoagulation currently  . 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 Palpitation     PACs and PVCs  . Pulmonary fibrosis   . First degree AV block     Conduction system  . Wide-complex tachycardia     I think this is VT, but i am not sure QRSd 135 1:1 AV  . Pulmonary embolism     Following knee surgery with a prior history  . COPD (chronic obstructive pulmonary disease)     past hx with pneumonia  . Stroke    Family History  Problem Relation  Age of Onset  . Stomach cancer Brother   . Heart disease Father   . Heart attack Mother   . Other Son     Triple Bypass   SOCIAL HISTORY: History  Substance Use Topics  . Smoking status: Never Smoker   . Smokeless tobacco: Never Used  . Alcohol Use: No   Allergies  Allergen Reactions  . Crestor [Rosuvastatin] Other (See Comments)    Myalgia  . Darvon     Drop in  BP   Current Outpatient Prescriptions  Medication Sig Dispense Refill  . atorvastatin (LIPITOR) 20 MG tablet Take 20 mg by mouth Daily.       . hydrochlorothiazide (HYDRODIURIL) 25 MG tablet Take one half tablet daily as needed  15 tablet  3  . nebivolol (BYSTOLIC) 10 MG tablet Take one tablet by mouth daily       No current facility-administered medications for this visit.   REVIEW OF SYSTEMS: Arly.Keller ] denotes positive finding; [  ] denotes negative finding  CARDIOVASCULAR:  [ ]  chest pain   [ ]  chest pressure   [ ]  palpitations   [ ]  orthopnea   [ ]  dyspnea on exertion   [ ]  claudication   [ ]  rest pain   [ ]  DVT   [ ]  phlebitis PULMONARY:   [ ]   productive cough   [ ]  asthma   [ ]  wheezing NEUROLOGIC:   [ ]  weakness  [ ]  paresthesias  [ ]  aphasia  [ ]  amaurosis  [ ]  dizziness HEMATOLOGIC:   [ ]  bleeding problems   [ ]  clotting disorders MUSCULOSKELETAL:  [ ]  joint pain   [ ]  joint swelling Arly.Keller ] leg swelling GASTROINTESTINAL: [ ]   blood in stool  [ ]   hematemesis GENITOURINARY:  [ ]   dysuria  [ ]   hematuria PSYCHIATRIC:  [ ]  history of major depression INTEGUMENTARY:  [ ]  rashes  [ ]  ulcers CONSTITUTIONAL:  [ ]  fever   [ ]  chills  PHYSICAL EXAM: Filed Vitals:   08/16/13 1333  BP: 169/60  Pulse: 55  Resp: 18  Height: 5' 6.5" (1.689 m)  Weight: 148 lb 9.6 oz (67.405 kg)   Body mass index is 23.63 kg/(m^2). GENERAL: The patient is a well-nourished female, in no acute distress. The vital signs are documented above. CARDIOVASCULAR: There is a regular rate and rhythm. I do not detect carotid bruits. She has  palpable femoral pulses and palpable pedal pulses bilaterally. Feet are warm and well-perfused the PULMONARY: There is good air exchange bilaterally without wheezing or rales. ABDOMEN: Soft and non-tender with normal pitched bowel sounds.  MUSCULOSKELETAL: There are no major deformities or cyanosis. NEUROLOGIC: No focal weakness or paresthesias are detected. SKIN: she does have one large urachus vein on the right posterior popliteal fossa laterally. There is some varicosities along the anterior aspect of her right leg. PSYCHIATRIC: The patient has a normal affect.  DATA:  I have independently interpreted her venous duplex scan. This shows no evidence of DVT bilaterally. She has no evidence of reflux in the deep veins or in the saphenous vein bilaterally. There is no evidence of phlebitis.  MEDICAL ISSUES: I do not think that the pain she's having posterior to her right knee is related to the varicose vein present here. She does not have any significant deep venous reflux or saphenous reflux and therefore should not have significantly elevated venous pressure. I do not think that dressing this varicosity would relieve her symptoms. If this were related to venous disease I would expect this pain to be better at night when she is a horizontal been worse with exercise. Her pain however is worse at night when she is sleeping and better when she is moving. In addition the patient has no interest in having surgery on the varicosities. I'll be happy to see her back at any time if any new vascular issues arise.  DICKSON,CHRISTOPHER S [facility] [place] [facility]: [phone]

## 2013-09-06 ENCOUNTER — Encounter: Payer: Self-pay | Admitting: Critical Care Medicine

## 2013-09-06 ENCOUNTER — Ambulatory Visit (INDEPENDENT_AMBULATORY_CARE_PROVIDER_SITE_OTHER): Payer: Medicare HMO | Admitting: Critical Care Medicine

## 2013-09-06 VITALS — BP 112/62 | HR 61 | Temp 97.8°F | Ht 64.5 in | Wt 147.0 lb

## 2013-09-06 DIAGNOSIS — A310 Pulmonary mycobacterial infection: Secondary | ICD-10-CM

## 2013-09-06 DIAGNOSIS — A318 Other mycobacterial infections: Secondary | ICD-10-CM

## 2013-09-06 DIAGNOSIS — J479 Bronchiectasis, uncomplicated: Secondary | ICD-10-CM

## 2013-09-06 MED ORDER — AZITHROMYCIN 250 MG PO TABS: 250.0000 mg | ORAL_TABLET | Freq: Every day | ORAL | Status: DC

## 2013-09-06 NOTE — Patient Instructions (Addendum)
Sputum culture  CT scan of chest without contrast will be obtained I will call results Take azithromycin 250mg  Take two once then one daily until gone

## 2013-09-06 NOTE — Progress Notes (Signed)
Subjective:    Patient ID: [name] [name], female    DOB: 09/27/1924, 79 y.o.   MRN: [id number]  HPI  79 y.o. [facility]   09/06/2013 Chief Complaint  Patient presents with  . Acute Visit    Last seen 08/2012.  C/o prod cough with small amount of white to yellow mucus, weakness, chest tightness, pain in middle of back, and increased SOB x 1 month.  Symptoms worsened last Wednesday.  Since last OV, coughing more , pain in back, pain is dull ache, dyspnea is much worse.  Pt had PE/DVT 08/2012. Rx with anticoag until this past summer.   Mucus is white, yellow in color. No real wheeze  Past Medical History  Diagnosis Date  . IBS (irritable bowel syndrome)   . Diverticulosis   . Colon polyps   . High cholesterol   . MI (myocardial infarction) ?? probably not 1975    Normal left ventricular function; Nonischemic Myoview 2013; Cath neg 2011  . GERD (gastroesophageal reflux disease)   . Hypertension   . Renal stone   . Bronchiectasis, non-tuberculous   . DVT (deep venous thrombosis)     Resolved, no anticoagulation currently  . 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 Palpitation     PACs and PVCs  . Pulmonary fibrosis   . First degree AV block     Conduction system  . Wide-complex tachycardia     I think this is VT, but i am not sure QRSd 135 1:1 AV  . Pulmonary embolism     Following knee surgery with a prior history  . COPD (chronic obstructive pulmonary disease)     past hx with pneumonia  . Stroke      Family History  Problem Relation Age of Onset  . Stomach cancer Brother   . Heart disease Father   . Heart attack Mother   . Other Son     Triple Bypass     History   Social History  . Marital Status: Married    Spouse Name: N/A    Number of Children: 3  . Years of Education: N/A   Occupational History  . Retired     [employer]   Social History Main Topics  . Smoking status: Never Smoker   . Smokeless tobacco: Never Used  . Alcohol  Use: No  . Drug Use: No  . Sexual Activity: Not on file   Other Topics Concern  . Not on file   Social History Narrative  . No narrative on file     Allergies  Allergen Reactions  . Crestor [Rosuvastatin] Other (See Comments)    Myalgia  . Darvon     Drop in  BP     Outpatient Prescriptions Prior to Visit  Medication Sig Dispense Refill  . atorvastatin (LIPITOR) 20 MG tablet Take 20 mg by mouth Daily.       . hydrochlorothiazide (HYDRODIURIL) 25 MG tablet Take one half tablet daily as needed  15 tablet  3  . nebivolol (BYSTOLIC) 10 MG tablet Take one tablet by mouth daily       No facility-administered medications prior to visit.     Review of Systems  Constitutional: Positive for fatigue. Negative for fever, chills, diaphoresis, activity change, appetite change and unexpected weight change.  HENT: Negative for congestion, dental problem, ear discharge, ear pain, facial swelling, hearing loss, mouth sores, nosebleeds, postnasal drip, rhinorrhea, sinus pressure, sneezing, sore throat, tinnitus, trouble swallowing and voice change.  Eyes: Negative for photophobia, discharge, itching and visual disturbance.  Respiratory: Positive for cough, chest tightness and shortness of breath. Negative for apnea, choking, wheezing and stridor.   Cardiovascular: Negative for chest pain, palpitations and leg swelling.  Gastrointestinal: Negative for nausea, vomiting, abdominal pain, constipation, blood in stool and abdominal distention.  Genitourinary: Negative for dysuria, urgency, frequency, hematuria, flank pain, decreased urine volume and difficulty urinating.  Musculoskeletal: Positive for joint swelling. Negative for arthralgias, back pain, gait problem, myalgias, neck pain and neck stiffness.  Skin: Negative for color change, pallor and rash.  Neurological: Positive for weakness. Negative for dizziness, tremors, seizures, syncope, speech difficulty, light-headedness, numbness and  headaches.  Hematological: Negative for adenopathy. Does not bruise/bleed easily.  Psychiatric/Behavioral: Negative for confusion, sleep disturbance and agitation. The patient is not nervous/anxious.        Objective:   Physical Exam  Filed Vitals:   09/06/13 1024  BP: 112/62  Pulse: 61  Temp: 97.8 F (36.6 C)  TempSrc: Oral  Height: 5' 4.5" (1.638 m)  Weight: 147 lb (66.679 kg)  SpO2: 96%    Gen: Pleasant, well-nourished, in no distress,  normal affect  ENT: No lesions,  mouth clear,  oropharynx clear, no postnasal drip  Neck: No JVD, no TMG, no carotid bruits  Lungs: No use of accessory muscles, no dullness to percussion, clear without rales or rhonchi  Cardiovascular: RRR, heart sounds normal, no murmur or gallops, no peripheral edema  Abdomen: soft and NT, no HSM,  BS normal  Musculoskeletal: No deformities, no cyanosis or clubbing  Neuro: alert, non focal  Skin: Warm, no lesions or rashes   CT Chest 07/2012:  Bilateral PE        Assessment & Plan:   Bronchiectasis, non-tuberculous D/T fume exposure, now MAC colonization 11/2011 PFTs 04/13/2012 FeV1 84% FVC 84%  TLC 82%  DLCO 83% CXR 04/13/2012  No active disease No evidence for amiodarone toxicity  Bronchiectasis flare  Plan Sputum c/s Repeat CT Chest Azithromycin x 5 days      Updated Medication List Outpatient Encounter Prescriptions as of 09/06/2013  Medication Sig  . atorvastatin (LIPITOR) 20 MG tablet Take 20 mg by mouth Daily.   . hydrochlorothiazide (HYDRODIURIL) 25 MG tablet Take one half tablet daily as needed  . nebivolol (BYSTOLIC) 10 MG tablet Take one tablet by mouth daily  . azithromycin (ZITHROMAX) 250 MG tablet Take 1 tablet (250 mg total) by mouth daily. Take two once then one daily until gone

## 2013-09-07 ENCOUNTER — Other Ambulatory Visit: Payer: Medicare HMO

## 2013-09-07 DIAGNOSIS — J479 Bronchiectasis, uncomplicated: Secondary | ICD-10-CM

## 2013-09-07 DIAGNOSIS — A310 Pulmonary mycobacterial infection: Secondary | ICD-10-CM

## 2013-09-08 ENCOUNTER — Ambulatory Visit (INDEPENDENT_AMBULATORY_CARE_PROVIDER_SITE_OTHER)
Admission: RE | Admit: 2013-09-08 | Discharge: 2013-09-08 | Disposition: A | Discharge status: Home / Self Care | Payer: Medicare HMO | Source: Ambulatory Visit | Attending: Critical Care Medicine | Admitting: Critical Care Medicine

## 2013-09-08 DIAGNOSIS — A310 Pulmonary mycobacterial infection: Secondary | ICD-10-CM

## 2013-09-08 DIAGNOSIS — A318 Other mycobacterial infections: Secondary | ICD-10-CM

## 2013-09-09 NOTE — Assessment & Plan Note (Addendum)
D/T fume exposure, now MAC colonization 11/2011 PFTs 04/13/2012 FeV1 84% FVC 84%  TLC 82%  DLCO 83% CXR 04/13/2012  No active disease No evidence for amiodarone toxicity  Bronchiectasis flare  Plan Sputum c/s Repeat CT Chest Azithromycin x 5 days

## 2013-09-11 ENCOUNTER — Telehealth: Payer: Self-pay | Admitting: *Deleted

## 2013-09-11 NOTE — Telephone Encounter (Signed)
Received the following results from PW:  Result Note    Let the pt know CT Scan unchanged from 12/2012 and shows chronic infection with mycobacterium. I will call her on my return   Create a phone message so I can track   ------  Called, spoke with pt.  Informed her of above.  She verbalized understanding of this and is aware PW will call her later this week when he returns to office.  She voiced no further questions or concerns at this time.  Will route msg to PW as a reminder.

## 2013-09-11 NOTE — Progress Notes (Signed)
Quick Note:  Called, spoke with pt. Informed her of CT Chest results per PW. She verbalized understanding, is aware PW will call her upon his return to office later this week, and voiced no further questions or concerns at this time. Phone msg was created and sent to PW as a reminder per his request. ______

## 2013-09-13 NOTE — Telephone Encounter (Signed)
Pt aware of results. Plan to watch off azithromycin and f/u sputum c/s results

## 2013-09-25 ENCOUNTER — Telehealth: Payer: Self-pay | Admitting: Critical Care Medicine

## 2013-09-25 NOTE — Telephone Encounter (Signed)
noted 

## 2013-09-25 NOTE — Telephone Encounter (Signed)
Critical lab was called in to [facility] on pt. AFB culture came back positive for micro bacterium avium intracellular complex.

## 2013-09-25 NOTE — Progress Notes (Signed)
Quick Note:  ATC pt x 5 - line busy each time. WCB ______

## 2013-09-26 NOTE — Progress Notes (Signed)
Quick Note:  Called, spoke with pt's husband. Was advised pt is not home right now. He will ask her to call office back. ______

## 2013-09-26 NOTE — Progress Notes (Signed)
Quick Note:  Called, spoke with pt. Pt states she has already called to schedule this. Pt has a pending OV with PW on Thursday, Nov 20 at 10 am. Pt aware and voiced no further questions or concerns at this time. ______

## 2013-09-28 ENCOUNTER — Encounter: Payer: Self-pay | Admitting: Critical Care Medicine

## 2013-09-28 ENCOUNTER — Ambulatory Visit (INDEPENDENT_AMBULATORY_CARE_PROVIDER_SITE_OTHER): Payer: Medicare HMO | Admitting: Critical Care Medicine

## 2013-09-28 VITALS — BP 140/70 | HR 61 | Temp 97.5°F | Ht 64.5 in | Wt 150.2 lb

## 2013-09-28 DIAGNOSIS — A318 Other mycobacterial infections: Secondary | ICD-10-CM

## 2013-09-28 DIAGNOSIS — J479 Bronchiectasis, uncomplicated: Secondary | ICD-10-CM

## 2013-09-28 DIAGNOSIS — A310 Pulmonary mycobacterial infection: Secondary | ICD-10-CM

## 2013-09-28 NOTE — Progress Notes (Signed)
Subjective:    Patient ID: [name] [name], female    DOB: 10-15-1924, 79 y.o.   MRN: [id number]  HPI  79 y.o. [facility]   09/28/2013 Chief Complaint  Patient presents with  . Follow-up    discuss results.  Symptoms have slightly improved.  Coughing occas - prod at times with white to slight yellow mucus.  Zpak was never filled.  No active complaints. No new cough , no active symptoms. Pt denies any significant sore throat, nasal congestion or excess secretions, fever, chills, sweats, unintended weight loss, pleurtic or exertional chest pain, orthopnea PND, or leg swelling Pt denies any increase in rescue therapy over baseline, denies waking up needing it or having any early am or nocturnal exacerbations of coughing/wheezing/or dyspnea. Pt also denies any obvious fluctuation in symptoms with  weather or environmental change or other alleviating or aggravating factors    Past Medical History  Diagnosis Date  . IBS (irritable bowel syndrome)   . Diverticulosis   . Colon polyps   . High cholesterol   . MI (myocardial infarction) ?? probably not 1975    Normal left ventricular function; Nonischemic Myoview 2013; Cath neg 2011  . GERD (gastroesophageal reflux disease)   . Hypertension   . Renal stone   . Bronchiectasis, non-tuberculous   . DVT (deep venous thrombosis)     Resolved, no anticoagulation currently  . 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 Palpitation     PACs and PVCs  . Pulmonary fibrosis   . First degree AV block     Conduction system  . Wide-complex tachycardia     I think this is VT, but i am not sure QRSd 135 1:1 AV  . Pulmonary embolism     Following knee surgery with a prior history  . COPD (chronic obstructive pulmonary disease)     past hx with pneumonia  . Stroke      Family History  Problem Relation Age of Onset  . Stomach cancer Brother   . Heart disease Father   . Heart attack Mother   . Other Son     Triple Bypass     History    Social History  . Marital Status: Married    Spouse Name: N/A    Number of Children: 3  . Years of Education: N/A   Occupational History  . Retired     [employer]   Social History Main Topics  . Smoking status: Never Smoker   . Smokeless tobacco: Never Used  . Alcohol Use: No  . Drug Use: No  . Sexual Activity: Not on file   Other Topics Concern  . Not on file   Social History Narrative  . No narrative on file     Allergies  Allergen Reactions  . Crestor [Rosuvastatin] Other (See Comments)    Myalgia  . Darvon     Drop in  BP     Outpatient Prescriptions Prior to Visit  Medication Sig Dispense Refill  . atorvastatin (LIPITOR) 20 MG tablet Take 20 mg by mouth Daily.       . hydrochlorothiazide (HYDRODIURIL) 25 MG tablet Take one half tablet daily as needed  15 tablet  3  . nebivolol (BYSTOLIC) 10 MG tablet Take one tablet by mouth daily      . azithromycin (ZITHROMAX) 250 MG tablet Take 1 tablet (250 mg total) by mouth daily. Take two once then one daily until gone  6 each  0  No facility-administered medications prior to visit.     Review of Systems  Constitutional: Positive for fatigue. Negative for fever, chills, diaphoresis, activity change, appetite change and unexpected weight change.  HENT: Negative for congestion, dental problem, ear discharge, ear pain, facial swelling, hearing loss, mouth sores, nosebleeds, postnasal drip, rhinorrhea, sinus pressure, sneezing, sore throat, tinnitus, trouble swallowing and voice change.   Eyes: Negative for photophobia, discharge, itching and visual disturbance.  Respiratory: Positive for cough, chest tightness and shortness of breath. Negative for apnea, choking, wheezing and stridor.   Cardiovascular: Negative for chest pain, palpitations and leg swelling.  Gastrointestinal: Negative for nausea, vomiting, abdominal pain, constipation, blood in stool and abdominal distention.  Genitourinary: Negative for  dysuria, urgency, frequency, hematuria, flank pain, decreased urine volume and difficulty urinating.  Musculoskeletal: Positive for joint swelling. Negative for arthralgias, back pain, gait problem, myalgias, neck pain and neck stiffness.  Skin: Negative for color change, pallor and rash.  Neurological: Positive for weakness. Negative for dizziness, tremors, seizures, syncope, speech difficulty, light-headedness, numbness and headaches.  Hematological: Negative for adenopathy. Does not bruise/bleed easily.  Psychiatric/Behavioral: Negative for confusion, sleep disturbance and agitation. The patient is not nervous/anxious.        Objective:   Physical Exam  Filed Vitals:   09/28/13 0959  BP: 140/70  Pulse: 61  Temp: 97.5 F (36.4 C)  TempSrc: Oral  Height: 5' 4.5" (1.638 m)  Weight: 150 lb 3.2 oz (68.13 kg)  SpO2: 97%    Gen: Pleasant, well-nourished, in no distress,  normal affect  ENT: No lesions,  mouth clear,  oropharynx clear, no postnasal drip  Neck: No JVD, no TMG, no carotid bruits  Lungs: No use of accessory muscles, no dullness to percussion, clear without rales or rhonchi  Cardiovascular: RRR, heart sounds normal, no murmur or gallops, no peripheral edema  Abdomen: soft and NT, no HSM,  BS normal  Musculoskeletal: No deformities, no cyanosis or clubbing  Neuro: alert, non focal  Skin: Warm, no lesions or rashes   CT Chest 07/2012:  Bilateral PE        Assessment & Plan:   Bronchiectasis, non-tuberculous Bronchiectasis d/t MAC.  Pos MAC on sputum C/S.  No need for ABX therapy.   Plan Observation for now. No need for ABX Rx.   MAC (mycobacterium avium-intracellulare complex) RLL tree in bud pattern CT chest 11/2011 with bronchiectasis Asymptomatic  PFTs 04/13/2012 FeV1 84% FVC 84%  TLC 82%  DLCO 83% CXR 04/13/2012  No active disease No evidence for amiodarone toxicity Sputum c/s 08/2013: pos for MAC Plan Observation only    Updated Medication  List Outpatient Encounter Prescriptions as of 09/28/2013  Medication Sig  . atorvastatin (LIPITOR) 20 MG tablet Take 20 mg by mouth Daily.   . hydrochlorothiazide (HYDRODIURIL) 25 MG tablet Take one half tablet daily as needed  . nebivolol (BYSTOLIC) 10 MG tablet Take one tablet by mouth daily  . omeprazole (PRILOSEC) 40 MG capsule Take 1 capsule by mouth as needed.  . [DISCONTINUED] azithromycin (ZITHROMAX) 250 MG tablet Take 1 tablet (250 mg total) by mouth daily. Take two once then one daily until gone

## 2013-09-28 NOTE — Patient Instructions (Signed)
No antibiotics recommended Call if symptoms worse

## 2013-09-29 NOTE — Assessment & Plan Note (Signed)
Bronchiectasis d/t MAC.  Pos MAC on sputum C/S.  No need for ABX therapy.   Plan Observation for now. No need for ABX Rx.

## 2013-09-29 NOTE — Assessment & Plan Note (Signed)
RLL tree in bud pattern CT chest 11/2011 with bronchiectasis Asymptomatic  PFTs 04/13/2012 FeV1 84% FVC 84%  TLC 82%  DLCO 83% CXR 04/13/2012  No active disease No evidence for amiodarone toxicity Sputum c/s 08/2013: pos for MAC Plan Observation only

## 2013-10-02 ENCOUNTER — Telehealth: Payer: Self-pay | Admitting: Critical Care Medicine

## 2013-10-02 NOTE — Telephone Encounter (Signed)
I spoke with April. She was calling to confirm we were taking care of pt lab results. I advised her he was seen 09/28/13. Nothing further needed

## 2013-10-09 ENCOUNTER — Ambulatory Visit: Payer: Medicare HMO | Admitting: Critical Care Medicine

## 2013-11-17 IMAGING — CT CT ANGIO CHEST
1 of 2 series · 19 of 32 positions shown · IV contrast (APPLIED)
Comparison: Chest x-ray of 11/14/2012

CLINICAL DATA: The venous thrombosis, shortness of breath, syncope,
history of right total knee replacement

CT ANGIOGRAPHY CHEST
TECHNIQUE: Multidetector CT imaging of the chest using the
standard protocol during bolus administration of intravenous
contrast. Multiplanar reconstructed images including MIPs were
obtained and reviewed to evaluate the vascular anatomy.
Contrast: 100mL OMNIPAQUE IOHEXOL 350 MG/ML SOLN

[Series 6: pulm embolism 1.0 b25f thin · axial · 0.70mm/px · z∈[+1026,+1278]mm · 19 of 277 slices shown]
[im 13/277  lung]
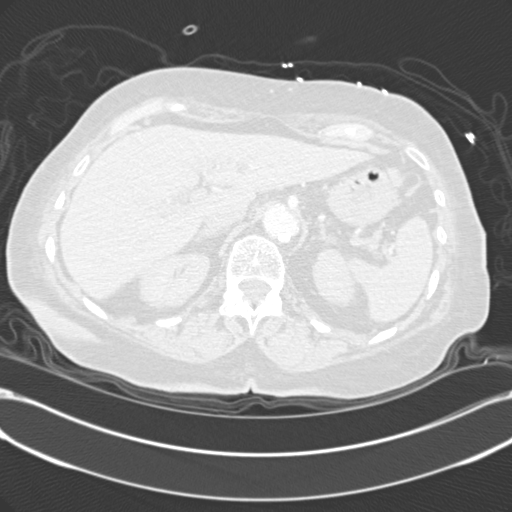
[im 25/277  soft-tissue]
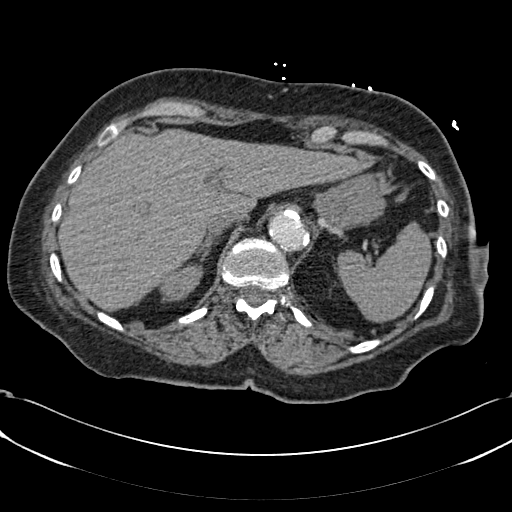
[im 37/277  lung]
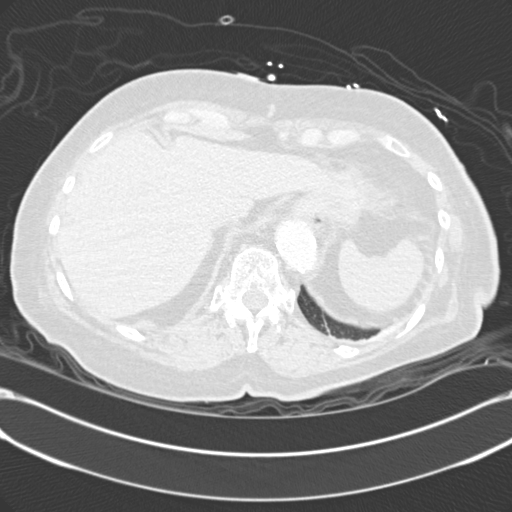
[im 61/277  soft-tissue]
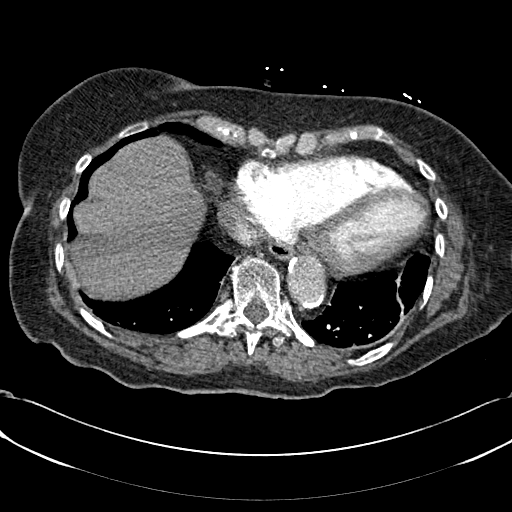
[im 73/277  lung]
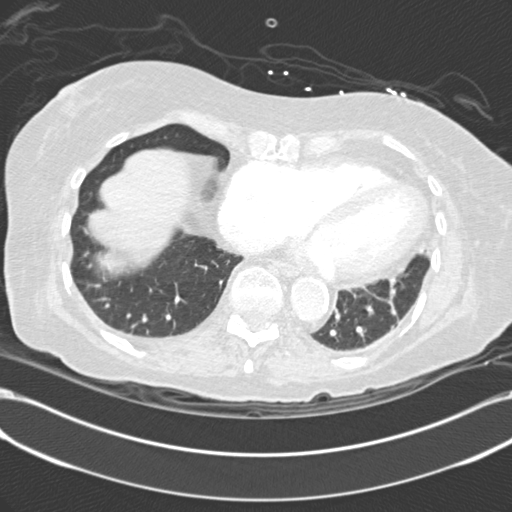
[im 85/277  soft-tissue]
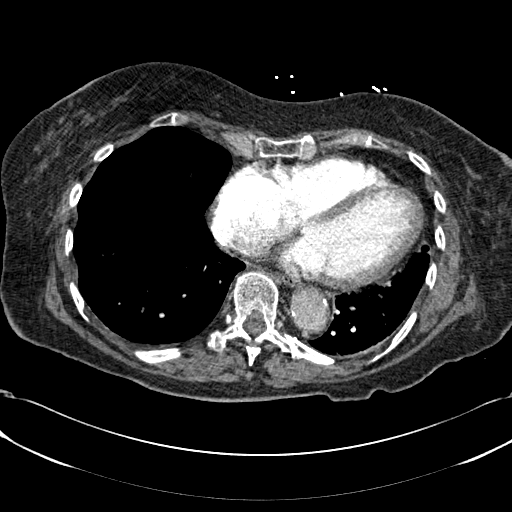
[im 97/277  lung]
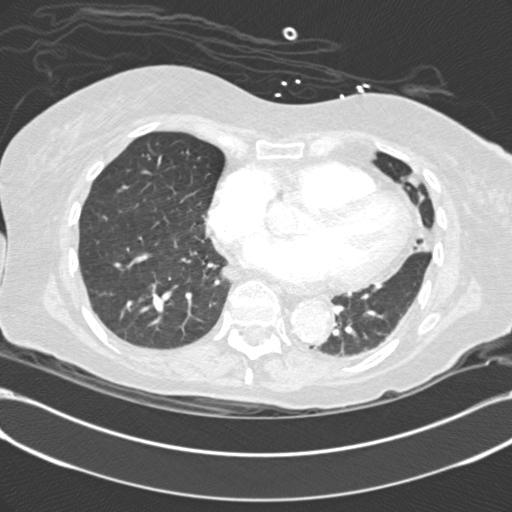
[im 109/277  soft-tissue]
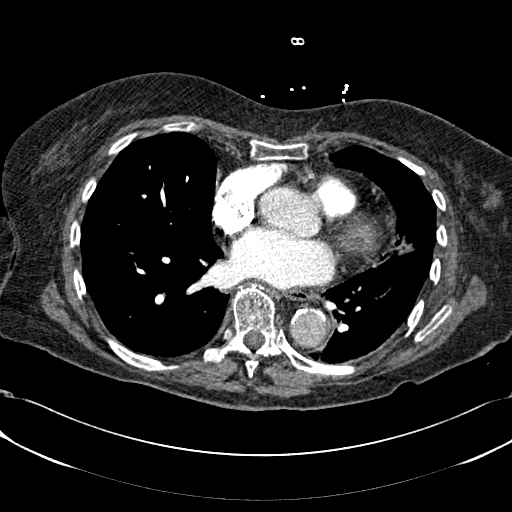
[im 121/277  lung]
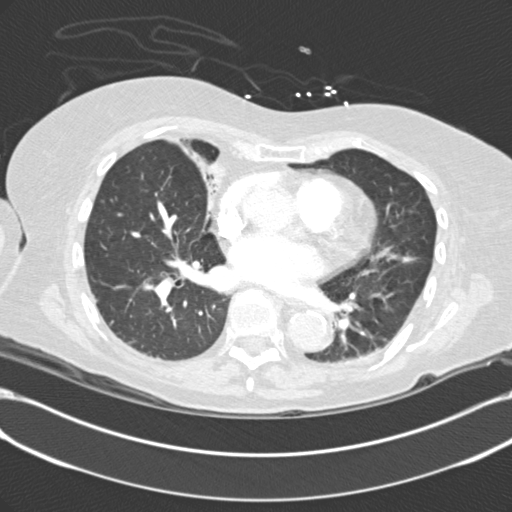
[im 145/277  soft-tissue]
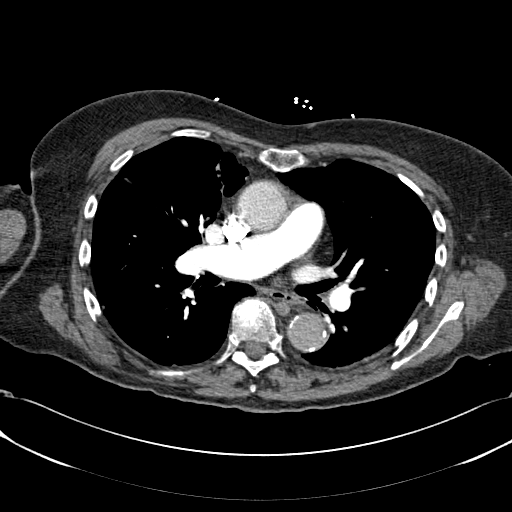
[im 157/277  lung]
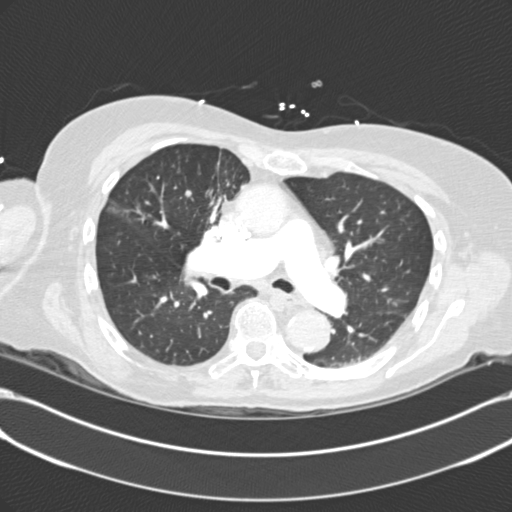
[im 169/277  soft-tissue]
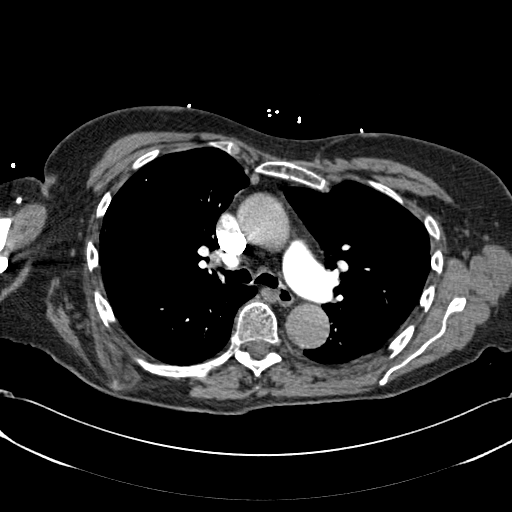
[im 181/277  lung]
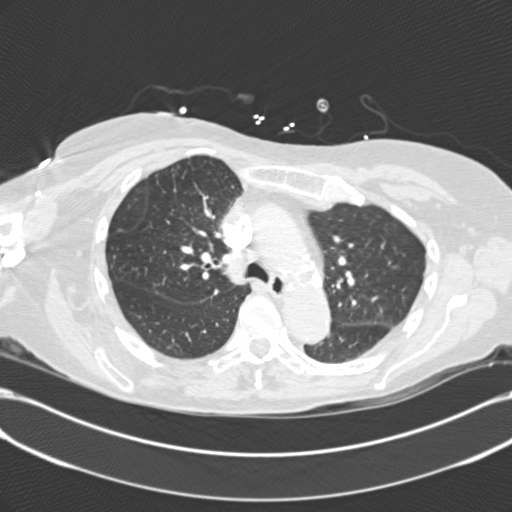
[im 193/277  soft-tissue]
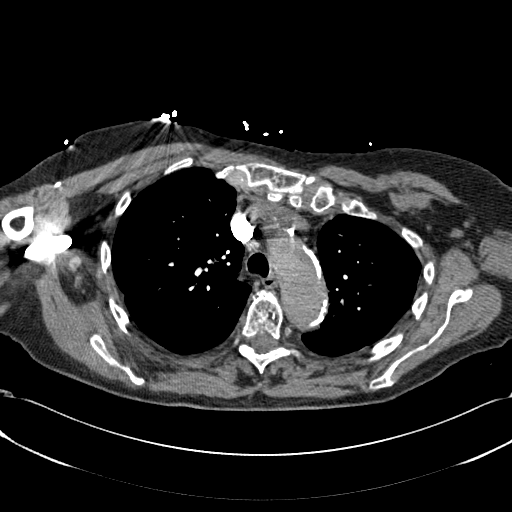
[im 205/277  lung]
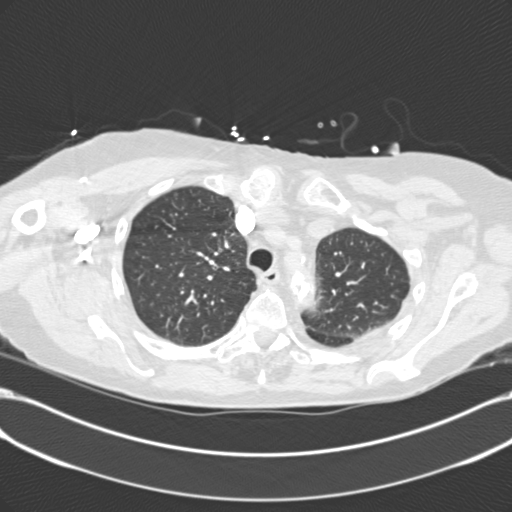
[im 217/277  soft-tissue]
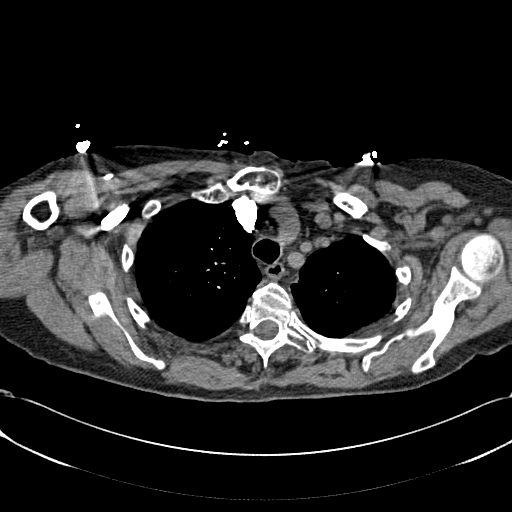
[im 241/277  lung]
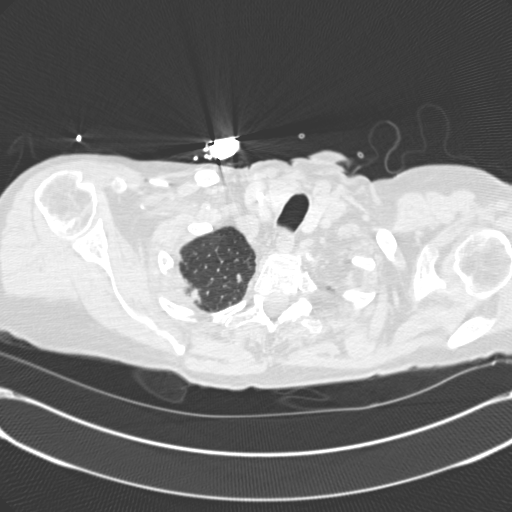
[im 253/277  soft-tissue]
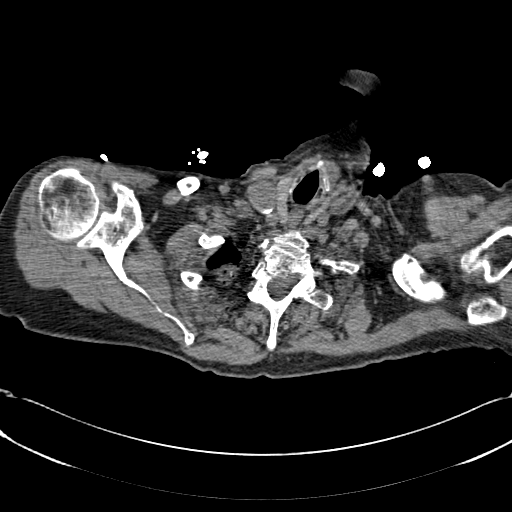
[im 265/277  lung]
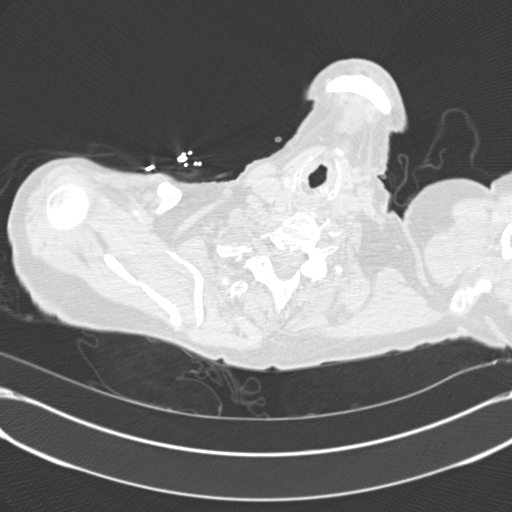

[19 of 32 positions shown; findings below may reference images not displayed]

FINDINGS: The pulmonary arteries are well opacified and there is no
evidence of acute pulmonary embolism.  The thoracic aorta is not
well opacified and cannot be evaluated.  There is atheromatous
change throughout the aortic arch and descending thoracic aorta and
cardiomegaly is noted.  There may be a small low attenuation nodule
in the right lobe of thyroid.

On the lung window images, there is biapical pleuroparenchymal
scarring present. There are more pulmonary nodules within the right
upper lobe than noted on the prior study.  However the dominant
nodule anteriorly in the right upper lobe measures 5 mm in diameter
compared to 4 mm previously.  This would be very atypical for
metastatic disease and most likely is inflammatory in nature.
Scarring and bronchiectatic change is noted anteriorly and
inferiorly within the right upper lobe and within the anterior
basal left lower lobe as well.  No active infiltrate or effusion is
seen.  No acute skeletal abnormality is seen.
IMPRESSION: 1.  No evidence of acute pulmonary embolism.
2.  More nodularity within the right upper lobe than on the prior
study although several other nodules noted previously are stable,
most consistent with an inflammatory process, possibly
granulomatous in nature.
3.  Stable scarring and bronchiectatic change within the anterior
inferior right upper lobe and anterior basal left lower lobe.

## 2013-11-27 ENCOUNTER — Ambulatory Visit (INDEPENDENT_AMBULATORY_CARE_PROVIDER_SITE_OTHER): Payer: Medicare HMO | Admitting: Nurse Practitioner

## 2013-11-27 ENCOUNTER — Encounter: Payer: Self-pay | Admitting: Nurse Practitioner

## 2013-11-27 ENCOUNTER — Encounter (INDEPENDENT_AMBULATORY_CARE_PROVIDER_SITE_OTHER): Payer: Self-pay

## 2013-11-27 VITALS — BP 152/72 | HR 55 | Ht 66.0 in | Wt 150.0 lb

## 2013-11-27 DIAGNOSIS — I472 Ventricular tachycardia: Secondary | ICD-10-CM

## 2013-11-27 DIAGNOSIS — R5383 Other fatigue: Secondary | ICD-10-CM

## 2013-11-27 DIAGNOSIS — I10 Essential (primary) hypertension: Secondary | ICD-10-CM

## 2013-11-27 DIAGNOSIS — R0989 Other specified symptoms and signs involving the circulatory and respiratory systems: Secondary | ICD-10-CM

## 2013-11-27 DIAGNOSIS — I25810 Atherosclerosis of coronary artery bypass graft(s) without angina pectoris: Secondary | ICD-10-CM

## 2013-11-27 DIAGNOSIS — R000 Tachycardia, unspecified: Secondary | ICD-10-CM

## 2013-11-27 DIAGNOSIS — R0600 Dyspnea, unspecified: Secondary | ICD-10-CM

## 2013-11-27 DIAGNOSIS — I4729 Other ventricular tachycardia: Secondary | ICD-10-CM

## 2013-11-27 DIAGNOSIS — R0609 Other forms of dyspnea: Secondary | ICD-10-CM

## 2013-11-27 DIAGNOSIS — R5381 Other malaise: Secondary | ICD-10-CM

## 2013-11-27 MED ORDER — HYDROCHLOROTHIAZIDE 25 MG PO TABS: 12.5000 mg | ORAL_TABLET | Freq: Every day | ORAL | Status: DC

## 2013-11-27 NOTE — Patient Instructions (Signed)
Your physician has requested that you have a lexiscan myoview (1 WEEK). Please follow instruction sheet, as given.  Your physician has requested that you have an echocardiogram (1 WEEK). Echocardiography is a painless test that uses sound waves to create images of your heart. It provides your doctor with information about the size and shape of your heart and how well your heart's chambers and valves are working. This procedure takes approximately one hour. There are no restrictions for this procedure.  Your physician recommends that you schedule a follow-up appointment in: 2 WEEKS WITH BROOKE EDMISTEN PER CHRIS BERGE  Your physician recommends that you return for lab work in: BMET (1 WEEK WHEN WITH ONE OTHER THE PROCEDURES)  Your physician has recommended you make the following change in your medication:   INCREASE HYDROCHLOROTHIAZIDE 12.5 MG ONCE A DAY   ALL OTHER MEDICATIONS TAKE THE SAME

## 2013-11-27 NOTE — Progress Notes (Signed)
Patient Name: [name] [name] Date of Encounter: 11/27/2013  Primary Care Provider:  [name], MD Primary Cardiologist:  [name], MD   Patient Profile  80 y/o female with a h/o palpitations and RV tachycardia who presents today 2/2 a 2 wk h/o fatigue and dyspnea.  Problem List   Past Medical History  Diagnosis Date  . IBS (irritable bowel syndrome)   . Diverticulosis   . Colon polyps   . High cholesterol   . MI (myocardial infarction) ?? probably not 1975    a. 1975 Reported MI;  b. 2011 Cath: nonobs dzs;  c. 2013 Nonischemic Myoview;  d. 12/2012 Echo: EF 60-65%, Gr1 DD.  Marland Kitchen GERD (gastroesophageal reflux disease)   . Hypertension   . Nephrolithiasis   . Bronchiectasis, non-tuberculous     a. Followed by Dr. Delford Field.  Marland Kitchen DVT (deep venous thrombosis)     Resolved, no anticoagulation currently  . 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 Palpitation     a. 2010 Event Monitor: PACs and PVCs  . Pulmonary fibrosis   . First degree AV block     Conduction system  . Wide-complex tachycardia     a. Followed by Dr. [name], "I think this is VT, but i am not sure QRSd 135 1:1 AV."  . Pulmonary embolism     Following knee surgery with a prior history  . COPD (chronic obstructive pulmonary disease)     past hx with pneumonia  . Stroke    Past Surgical History  Procedure Laterality Date  . Total abdominal hysterectomy    . Appendectomy    . Bladder tack    . Cholecystectomy    . Colon surgery      12inches colectomy s/p diverticulitis  . Tonsillectomy    . Total knee arthroplasty  12/12/2012    Procedure: TOTAL KNEE ARTHROPLASTY;  Surgeon: Harvie Junior, MD;  Location: [facility] OR;  Service: Orthopedics;  Laterality: Right;  right total knee arthroplasty    Allergies  Allergies  Allergen Reactions  . Amiodarone     Did not tolerate  . Crestor [Rosuvastatin] Other (See Comments)    Myalgia  . Darvon     Drop in  BP    HPI  80 year old female with the  above problem list.  She has previously been followed for palpitations which were identified as PACs.  She also has a history of possible right ventricular tachycardia/wide-complex tachycardia for which she had previous been treated with amiodarone which she did not tolerate.  She was also offered catheter ablation which at the time she did not feel well suited for.  Her last catheterization was in 2011 which was reportedly normal.  She had a negative Myoview in 2013 and an echocardiogram in 2014, both of which were normal. Upon her last visit here in August, her bystolic was increased from 5-10 mg daily.  With this which, she has not had any significant or prolonged palpitations yesterday she did have an episode of palpitations lasting a few seconds and resolving spontaneously.  Approximately 2 weeks ago, she began to experience dyspnea on exertion, which has persisted.  Also about 2 weeks ago, she was sitting drinking coffee with her husband when she had an episode of burning in her chest associated with left shoulder and back discomfort lasting a few minutes and resolving spontaneously.  She's had no recurrence of chest burning despite persistence of dyspnea.  She says she now gets dyspneic with showering over  the long conversations.  She was at a dentist appointment last week and was noted to be hypertensive and she was subsequently referred to her primary care provider.  When he saw her, her blood pressure heart rate were stable but upon report of her progressive dyspnea, decision was made to refer back to cardiology for evaluation.  She denies PND, orthopnea, dizziness, syncope, edema, or early satiety.  Her blood pressures at home are typically running in the 150-170 range.   Home Medications  Prior to Admission medications   Medication Sig Start Date End Date Taking? Authorizing Provider  atorvastatin (LIPITOR) 20 MG tablet Take 20 mg by mouth Daily.    Yes Historical Provider, MD  hydrochlorothiazide  (HYDRODIURIL) 25 MG tablet Take 0.5 tablets (12.5 mg total) by mouth daily. 11/27/13  Yes Ok Anis, NP  nebivolol (BYSTOLIC) 10 MG tablet Take one tablet by mouth daily 06/22/13  Yes Duke Salvia, MD  omeprazole (PRILOSEC) 40 MG capsule Take 1 capsule by mouth as needed.   Yes Historical Provider, MD    Family History  Family History  Problem Relation Age of Onset  . Stomach cancer Brother   . Heart disease Father   . Heart attack Mother   . Other Son     Triple Bypass    Social History  History   Social History  . Marital Status: Married    Spouse Name: N/A    Number of Children: 3  . Years of Education: N/A   Occupational History  . Retired     [employer]   Social History Main Topics  . Smoking status: Never Smoker   . Smokeless tobacco: Never Used  . Alcohol Use: No  . Drug Use: No  . Sexual Activity: Not on file   Other Topics Concern  . Not on file   Social History Narrative  . No narrative on file     Review of Systems General:  No chills, fever, night sweats or weight changes.  Cardiovascular:  One episode of chest pain 2 wks ago, dyspnea on exertion as outlined above, no edema, orthopnea, palpitations, paroxysmal nocturnal dyspnea. Dermatological: No rash, lesions/masses Respiratory: No cough, +++ dyspnea Urologic: No hematuria, dysuria Abdominal:   No nausea, vomiting, diarrhea, bright red blood per rectum, melena, or hematemesis Neurologic:  No visual changes, wkns, changes in mental status. All other systems reviewed and are otherwise negative except as noted above.  Physical Exam  Blood pressure 152/72, pulse 55, height 5\' 6"  (1.676 m), weight 150 lb (68.04 kg).  General: Pleasant, NAD Psych: Normal affect. Neuro: Alert and oriented X 3. Moves all extremities spontaneously. HEENT: Normal  Neck: Supple without bruits or JVD. Lungs:  Resp regular and unlabored, CTA. Heart: RRR no s3, s4, or murmurs. Abdomen: Soft,  non-tender, non-distended, BS + x 4.  Extremities: No clubbing, cyanosis or edema. DP/PT/Radials 2+ and equal bilaterally.  Accessory Clinical Findings  ECG - sinus bradycardia, 55, first degree AV block, no acute ST or T changes.  Assessment & Plan  1.  Dyspnea exertion and fatigue: Patient presents with a two-week history of dyspnea on exertion.  She denies any heart failure symptoms and her weight has been stable at home.  She had blood work drawn at her permanent care providers last week those results are apparently still pending.  From our standpoint, we'll evaluate a 2-D echocardiogram.  Her LV function was normal a year ago.  If LV function remains normal, I would  then pursue a lexiscan Cardiolite to rule out ischemia as a possible cause.   If EF is down, she will require catheterization. Her last stress test was in 2013 with reportedly nonobstructive 2011. If all testing is unrevealing, would recommend follow up with pulmonology given her history of bronchiectasis.  2.  Hypertension: Initially, blood pressure was 152/72 however on repeat she was 170/90.  She says she very commonly runs in the 170s at home.  I recommended that she start taking HCTZ 12.5 mg daily.  She previously only taking HCTZ as needed for lower extremity edema.  She will have a basic metabolic panel checked when she comes back for her echo.  3.  Palpitations with history of wide-complex tachycardia: Overall, she is doing exceptionally well.  She's only had one very brief episode of tachycardia and palpitations since last spring, occurring yesterday and lasted just a few seconds.  Continue her current dose of bystolic.  4.  Disposition:  2-D echo and stress testing as outlined above.  Followup in 2 weeks.   Nicolasa Duckinghristopher Tamario Heal, NP 11/27/2013, 10:58 AM

## 2013-12-01 ENCOUNTER — Other Ambulatory Visit (HOSPITAL_COMMUNITY): Payer: Medicare HMO

## 2013-12-05 ENCOUNTER — Encounter: Payer: Self-pay | Admitting: Cardiology

## 2013-12-05 ENCOUNTER — Ambulatory Visit (HOSPITAL_COMMUNITY): Payer: Medicare HMO | Attending: Cardiology | Admitting: Radiology

## 2013-12-05 ENCOUNTER — Ambulatory Visit (HOSPITAL_BASED_OUTPATIENT_CLINIC_OR_DEPARTMENT_OTHER): Payer: Medicare HMO | Admitting: Cardiology

## 2013-12-05 VITALS — BP 140/82 | HR 46 | Ht 66.0 in | Wt 147.0 lb

## 2013-12-05 DIAGNOSIS — I25810 Atherosclerosis of coronary artery bypass graft(s) without angina pectoris: Secondary | ICD-10-CM

## 2013-12-05 DIAGNOSIS — R0609 Other forms of dyspnea: Secondary | ICD-10-CM | POA: Insufficient documentation

## 2013-12-05 DIAGNOSIS — R0602 Shortness of breath: Secondary | ICD-10-CM | POA: Insufficient documentation

## 2013-12-05 DIAGNOSIS — Z8249 Family history of ischemic heart disease and other diseases of the circulatory system: Secondary | ICD-10-CM | POA: Insufficient documentation

## 2013-12-05 DIAGNOSIS — R002 Palpitations: Secondary | ICD-10-CM | POA: Insufficient documentation

## 2013-12-05 DIAGNOSIS — I252 Old myocardial infarction: Secondary | ICD-10-CM | POA: Insufficient documentation

## 2013-12-05 DIAGNOSIS — J449 Chronic obstructive pulmonary disease, unspecified: Secondary | ICD-10-CM | POA: Insufficient documentation

## 2013-12-05 DIAGNOSIS — I2510 Atherosclerotic heart disease of native coronary artery without angina pectoris: Secondary | ICD-10-CM

## 2013-12-05 DIAGNOSIS — R0989 Other specified symptoms and signs involving the circulatory and respiratory systems: Secondary | ICD-10-CM | POA: Insufficient documentation

## 2013-12-05 DIAGNOSIS — R5383 Other fatigue: Secondary | ICD-10-CM

## 2013-12-05 DIAGNOSIS — R5381 Other malaise: Secondary | ICD-10-CM | POA: Insufficient documentation

## 2013-12-05 DIAGNOSIS — J4489 Other specified chronic obstructive pulmonary disease: Secondary | ICD-10-CM | POA: Insufficient documentation

## 2013-12-05 DIAGNOSIS — I10 Essential (primary) hypertension: Secondary | ICD-10-CM | POA: Insufficient documentation

## 2013-12-05 DIAGNOSIS — R0600 Dyspnea, unspecified: Secondary | ICD-10-CM

## 2013-12-05 DIAGNOSIS — R079 Chest pain, unspecified: Secondary | ICD-10-CM | POA: Insufficient documentation

## 2013-12-05 DIAGNOSIS — Z8673 Personal history of transient ischemic attack (TIA), and cerebral infarction without residual deficits: Secondary | ICD-10-CM | POA: Insufficient documentation

## 2013-12-05 MED ORDER — TECHNETIUM TC 99M SESTAMIBI GENERIC - CARDIOLITE
10.0000 | Freq: Once | INTRAVENOUS | Status: AC | PRN
  Administered 2013-12-05: 10 via INTRAVENOUS

## 2013-12-05 MED ORDER — TECHNETIUM TC 99M SESTAMIBI GENERIC - CARDIOLITE
30.0000 | Freq: Once | INTRAVENOUS | Status: AC | PRN
  Administered 2013-12-05: 30 via INTRAVENOUS

## 2013-12-05 MED ORDER — REGADENOSON 0.4 MG/5ML IV SOLN
0.4000 mg | Freq: Once | INTRAVENOUS | Status: AC
  Administered 2013-12-05: 0.4 mg via INTRAVENOUS

## 2013-12-05 NOTE — Progress Notes (Signed)
  [facility] SITE 3 NUCLEAR MED [address] [place], [place] [address] [phone]    Cardiology Nuclear Med Study  [name] [name] is a 80 y.o. female     MRN : [id number]     DOB: Aug 02, 1924  Procedure Date: 12/05/2013  Nuclear Med Background Indication for Stress Test:  Evaluation for Ischemia History:  CAD, MI, Cath (normal), Echo 2014 EF 60-65%, MPI 2013 (normal), COPD Cardiac Risk Factors: CVA, Family History - CAD and Hypertension  Symptoms:  Chest Pain (last date of chest discomfort was earlier today), DOE, Fatigue, Palpitations and SOB   Nuclear Pre-Procedure Caffeine/Decaff Intake:  None NPO After: 7:00pm   Lungs:  clear O2 Sat: 95% on room air. IV 0.9% NS with Angio Cath:  22g  IV Site: L Hand  IV Started by:  Cathlyn Parsons, RN  Chest Size (in):  34 Cup Size: B  Height: 5\' 6"  (1.676 m)  Weight:  147 lb (66.679 kg)  BMI:  Body mass index is 23.74 kg/(m^2). Tech Comments:  Bystolic taken at [employer]    Nuclear Med Study 1 or 2 day study: 1 day  Stress Test Type:  Eugenie Birks  Reading MD: n/a  Order Authorizing Provider:  Ferman Hamming and Reatha Harps  Resting Radionuclide: Technetium 44m Sestamibi  Resting Radionuclide Dose: 11.0 mCi   Stress Radionuclide:  Technetium 31m Sestamibi  Stress Radionuclide Dose: 33.0 mCi           Stress Protocol Rest HR: 46 Stress HR: 69  Rest BP: 140/82 Stress BP: 131/86  Exercise Time (min): n/a METS: n/a           Dose of Adenosine (mg):  n/a Dose of Lexiscan: 0.4 mg  Dose of Atropine (mg): n/a Dose of Dobutamine: n/a mcg/kg/min (at max HR)  Stress Test Technologist: Rhyker Silversmith Chimes, BS-ES  Nuclear Technologist:  Domenic Polite, CNMT     Rest Procedure:  Myocardial perfusion imaging was performed at rest 45 minutes following the intravenous administration of Technetium 29m Sestamibi. Rest ECG: Sinus bradycardia, 1.AVB, LAD  Stress Procedure:  The patient received IV Lexiscan 0.4 mg over 15-seconds.   Technetium 71m Sestamibi injected at 30-seconds.  Quantitative spect images were obtained after a 45 minute delay.  During the infusion of Lexiscan, the patient complained of SOB.  This resolved in recovery.  Stress ECG: No significant change from baseline ECG  QPS Raw Data Images:  Normal; no motion artifact; normal heart/lung ratio. Stress Images:  Normal homogeneous uptake in all areas of the myocardium. Rest Images:  Normal homogeneous uptake in all areas of the myocardium. Subtraction (SDS):  No evidence of ischemia. Transient Ischemic Dilatation (Normal <1.22):  1.02 Lung/Heart Ratio (Normal <0.45):  0.34  Quantitative Gated Spect Images QGS EDV:  72 ml QGS ESV:  19 ml  Impression Exercise Capacity:  Lexiscan with no exercise. BP Response:  Normal blood pressure response. Clinical Symptoms:  There is dyspnea. ECG Impression:  No significant ST segment change suggestive of ischemia. Comparison with Prior Nuclear Study: No images to compare  Overall Impression:  Normal stress nuclear study.  LV Ejection Fraction: 74%.  LV Wall Motion:  NL LV Function; NL Wall Motion  [name] [name], [name] [name] 12/05/2013

## 2013-12-05 NOTE — Progress Notes (Signed)
Echo performed. 

## 2013-12-12 ENCOUNTER — Encounter: Payer: Self-pay | Admitting: Cardiology

## 2013-12-12 ENCOUNTER — Ambulatory Visit (INDEPENDENT_AMBULATORY_CARE_PROVIDER_SITE_OTHER): Payer: Medicare HMO | Admitting: Cardiology

## 2013-12-12 VITALS — BP 178/80 | HR 51 | Ht 66.0 in | Wt 151.0 lb

## 2013-12-12 DIAGNOSIS — I10 Essential (primary) hypertension: Secondary | ICD-10-CM

## 2013-12-12 DIAGNOSIS — I2510 Atherosclerotic heart disease of native coronary artery without angina pectoris: Secondary | ICD-10-CM

## 2013-12-12 DIAGNOSIS — R002 Palpitations: Secondary | ICD-10-CM

## 2013-12-12 DIAGNOSIS — R0600 Dyspnea, unspecified: Secondary | ICD-10-CM

## 2013-12-12 DIAGNOSIS — R0989 Other specified symptoms and signs involving the circulatory and respiratory systems: Secondary | ICD-10-CM

## 2013-12-12 DIAGNOSIS — R0609 Other forms of dyspnea: Secondary | ICD-10-CM

## 2013-12-12 LAB — BASIC METABOLIC PANEL
BUN: 12 mg/dL (ref 6–23)
CHLORIDE: 104 meq/L (ref 96–112)
CO2: 28 mEq/L (ref 19–32)
Calcium: 9.8 mg/dL (ref 8.4–10.5)
Creatinine, Ser: 0.9 mg/dL (ref 0.4–1.2)
GFR: 64.51 mL/min (ref >60.00)
Glucose, Bld: 86 mg/dL (ref 70–99)
Potassium: 3.8 mEq/L (ref 3.5–5.1)
Sodium: 139 mEq/L (ref 135–145)

## 2013-12-12 NOTE — Patient Instructions (Addendum)
Your physician recommends that you have  lab work today: BMET  Your physician recommends that you schedule a follow-up appointment in: 4 weeks with Dr. [name]  Your physician recommends that you continue on your current medications as directed. Please refer to the Current Medication list given to you today.    Please keep your scheduled appt with Dr. Delford Field 12/18/13 at 12

## 2013-12-12 NOTE — Progress Notes (Addendum)
Patient ID: [name] [name] MRN: [id number], DOB/AGE: [birth year]   Date of Visit: 12/12/2013  Primary Physician: [name], MD Primary Cardiologist: Berton Mount, MD Reason for Visit: 2-week follow-up  History of Present Illness  [name] [name] is a 80 y.o. female with palpitations identified as PACs, nonsustained atrial arrhythmias and RVOT VT who was seen in follow-up 2 weeks ago with concern regarding DOE. PCP had previously ordered labs which she tells me were normal; however, she does not know what was ordered. An echo was done revealing normal LVEF, no significant valvular abnormalities. LexiScan nuclear stress test was negative for ischemia. She presents today for follow-up. Of note, she has history of bronchiectasis due to MAC which is followed by Dr. Delford Field.   Since last being seen in our clinic, she reports she is feeling "about the same." She continues to have DOE. She denies SOB at rest. She denies chest pain. She has occasional palpitations but this is no worse than baseline for her and are not accompanied by any other symptoms. She denies dizziness, near syncope or syncope. She denies LE swelling, orthopnea or PND. She is compliant with medications.  Past Medical History Past Medical History  Diagnosis Date  . IBS (irritable bowel syndrome)   . Diverticulosis   . Colon polyps   . High cholesterol   . MI (myocardial infarction) ?? probably not 1975    a. 1975 Reported MI;  b. 2011 Cath: nonobs dzs;  c. 2013 Nonischemic Myoview;  d. 12/2012 Echo: EF 60-65%, Gr1 DD.  Marland Kitchen GERD (gastroesophageal reflux disease)   . Hypertension   . Nephrolithiasis   . Bronchiectasis, non-tuberculous     a. Followed by Dr. Delford Field.  Marland Kitchen DVT (deep venous thrombosis)     Resolved, no anticoagulation currently  . 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 Palpitation     a. 2010 Event Monitor: PACs and PVCs  . Pulmonary fibrosis   . First degree AV block     Conduction system  .  Wide-complex tachycardia     a. Followed by Dr. [name], "I think this is VT, but i am not sure QRSd 135 1:1 AV."  . Pulmonary embolism     Following knee surgery with a prior history  . COPD (chronic obstructive pulmonary disease)     past hx with pneumonia  . Stroke     Past Surgical History Past Surgical History  Procedure Laterality Date  . Total abdominal hysterectomy    . Appendectomy    . Bladder tack    . Cholecystectomy    . Colon surgery      12inches colectomy s/p diverticulitis  . Tonsillectomy    . Total knee arthroplasty  12/12/2012    Procedure: TOTAL KNEE ARTHROPLASTY;  Surgeon: Harvie Junior, MD;  Location: [facility] OR;  Service: Orthopedics;  Laterality: Right;  right total knee arthroplasty    Allergies/Intolerances Allergies  Allergen Reactions  . Amiodarone     Did not tolerate  . Crestor [Rosuvastatin] Other (See Comments)    Myalgia  . Darvon     Drop in  BP    Current Home Medications Current Outpatient Prescriptions  Medication Sig Dispense Refill  . atorvastatin (LIPITOR) 20 MG tablet Take 20 mg by mouth Daily.       . hydrochlorothiazide (HYDRODIURIL) 25 MG tablet Take 0.5 tablets (12.5 mg total) by mouth daily.  30 tablet  3  . nebivolol (BYSTOLIC) 10 MG tablet Take one tablet by mouth  daily      . omeprazole (PRILOSEC) 40 MG capsule Take 1 capsule by mouth as needed.       No current facility-administered medications for this visit.    Social History History   Social History  . Marital Status: Married    Spouse Name: N/A    Number of Children: 3  . Years of Education: N/A   Occupational History  . Retired     [employer]   Social History Main Topics  . Smoking status: Never Smoker   . Smokeless tobacco: Never Used  . Alcohol Use: No  . Drug Use: No  . Sexual Activity: Not on file   Other Topics Concern  . Not on file   Social History Narrative  . No narrative on file     Review of Systems General: No chills, fever,  night sweats or weight changes Cardiovascular: No chest pain, dyspnea on exertion, edema, orthopnea, palpitations, paroxysmal nocturnal dyspnea Dermatological: No rash, lesions or masses Respiratory: No cough, dyspnea Urologic: No hematuria, dysuria Abdominal: No nausea, vomiting, diarrhea, bright red blood per rectum, melena, or hematemesis Neurologic: No visual changes, weakness, changes in mental status All other systems reviewed and are otherwise negative except as noted above.  Physical Exam Vitals: Blood pressure 178/80, pulse 60, height 5\' 6"  (1.676 m), weight 151 lb (68.493 kg).  General: Well developed, well appearing 80 y.o. female in no acute distress. HEENT: Normocephalic, atraumatic. EOMs intact. Sclera nonicteric. Oropharynx clear.  Neck: Supple without bruits. No JVD. Lungs: Respirations regular and unlabored, CTA bilaterally. No wheezes, rales or rhonchi. Heart: RRR. S1, S2 present. No murmurs, rub, S3 or S4. Abdomen: Soft, non-tender, non-distended. BS present x 4 quadrants. No hepatosplenomegaly.  Extremities: No clubbing, cyanosis or edema. DP/PT/Radials 2+ and equal bilaterally. Psych: Normal affect. Neuro: Alert and oriented X 3. Moves all extremities spontaneously.   Diagnostics  Echocardiogram  Study Conclusions - Left ventricle: The cavity size was normal. Systolic function was normal. Wall motion was normal; there were no regional wall motion abnormalities. There was an increased relative contribution of atrial contraction to ventricular filling. Doppler parameters are consistent with abnormal left ventricular relaxation (grade 1 diastolic dysfunction). - Mitral valve: Mild regurgitation.  Lexiscan Myoview stress test Impression  Exercise Capacity: Lexiscan with no exercise.  BP Response: Normal blood pressure response.  Clinical Symptoms: There is dyspnea.  ECG Impression: No significant ST segment change suggestive of ischemia.  Comparison with Prior  Nuclear Study: No images to compare  Overall Impression: Normal stress nuclear study.  LV Ejection Fraction: 74%. LV Wall Motion: NL LV Function; NL Wall Motion  12-lead ECG today - sinus bradycardia with long PR at 51 bpm; PR 388, QRS 78, QT/QTc 438/303 Orthostatics  BP lying 185/77 pulse 48  BP sitting 173/81 pulse 48  BP standing - 0 min - 152/81 pulse 57 (dizzy)              2 min - 160/80 pulse 54 (dizzy)              5 min - 158/83 pulse 56   Assessment and Plan  1. DOE - echo shows normal LVEF, no significant valvular abnormalities - nuclear stress test negative for ischemia - discussed possibility of chronotropic incompetence and recommended a 48-hr Holter to rule out chronotropic incompetence; however, Ms. Huie declines Holter at this time - will decrease nebivolol to 5 mg once daily - return for follow-up with Dr. [name] in 2 weeks  with treadmill at that time - called to schedule follow-up with Pulmonologist, Dr. Delford FieldWright, who will see her on 12/18/2013 at 12 noon - also requested recent records/labs from PCP's office for review which will hopefully include CBC and TSH  2. HTN - BP readings from home quite variable, ranging from 170/80 down to 106/60; she bought new arm cuff with digital read and her measurements were the same - orthostatics as above - check BMET since started hydrochlorothiazide 2 weeks ago  3. Palpitations - stable; improved on BB - continue [employer]   Signed, Jeffrey Voth, PA-C 12/12/2013, 12:05 PM

## 2013-12-14 ENCOUNTER — Other Ambulatory Visit: Payer: Self-pay | Admitting: Cardiology

## 2013-12-14 DIAGNOSIS — I10 Essential (primary) hypertension: Secondary | ICD-10-CM

## 2013-12-14 DIAGNOSIS — R000 Tachycardia, unspecified: Secondary | ICD-10-CM

## 2013-12-14 DIAGNOSIS — I472 Ventricular tachycardia: Secondary | ICD-10-CM

## 2013-12-14 DIAGNOSIS — I25810 Atherosclerosis of coronary artery bypass graft(s) without angina pectoris: Secondary | ICD-10-CM

## 2013-12-14 MED ORDER — NEBIVOLOL HCL 5 MG PO TABS: ORAL_TABLET | ORAL | Status: DC

## 2013-12-18 ENCOUNTER — Encounter: Payer: Self-pay | Admitting: Critical Care Medicine

## 2013-12-18 ENCOUNTER — Other Ambulatory Visit: Payer: Self-pay | Admitting: Critical Care Medicine

## 2013-12-18 ENCOUNTER — Ambulatory Visit (INDEPENDENT_AMBULATORY_CARE_PROVIDER_SITE_OTHER): Payer: Medicare HMO | Admitting: Critical Care Medicine

## 2013-12-18 ENCOUNTER — Ambulatory Visit (INDEPENDENT_AMBULATORY_CARE_PROVIDER_SITE_OTHER)
Admission: RE | Admit: 2013-12-18 | Discharge: 2013-12-18 | Disposition: A | Discharge status: Home / Self Care | Payer: Medicare HMO | Source: Ambulatory Visit | Attending: Critical Care Medicine | Admitting: Critical Care Medicine

## 2013-12-18 ENCOUNTER — Ambulatory Visit: Payer: Medicare HMO

## 2013-12-18 ENCOUNTER — Telehealth: Payer: Self-pay | Admitting: Critical Care Medicine

## 2013-12-18 VITALS — BP 130/80 | HR 59 | Temp 97.6°F | Ht 66.0 in | Wt 152.2 lb

## 2013-12-18 DIAGNOSIS — R0602 Shortness of breath: Secondary | ICD-10-CM

## 2013-12-18 DIAGNOSIS — R0609 Other forms of dyspnea: Secondary | ICD-10-CM

## 2013-12-18 DIAGNOSIS — R0600 Dyspnea, unspecified: Secondary | ICD-10-CM

## 2013-12-18 DIAGNOSIS — R0989 Other specified symptoms and signs involving the circulatory and respiratory systems: Secondary | ICD-10-CM

## 2013-12-18 DIAGNOSIS — J479 Bronchiectasis, uncomplicated: Secondary | ICD-10-CM

## 2013-12-18 DIAGNOSIS — Z86711 Personal history of pulmonary embolism: Secondary | ICD-10-CM

## 2013-12-18 MED ORDER — IOHEXOL 350 MG/ML SOLN
80.0000 mL | Freq: Once | INTRAVENOUS | Status: AC | PRN
  Administered 2013-12-18: 80 mL via INTRAVENOUS

## 2013-12-18 NOTE — Assessment & Plan Note (Signed)
Hx of PE 2013, off anticoagulants since late 2014 ?recurrence Plan rescan chest

## 2013-12-18 NOTE — Assessment & Plan Note (Signed)
Hx of bronchiectasis and MAC colonization since 2013. Now more progressive dyspnea either d/t airway dz or r/o recurrent pulmonary embolism.  Note recent venous doppler of LE was neg for DVT Plan CT Angio of chest  Full PFTs

## 2013-12-18 NOTE — Telephone Encounter (Signed)
Pt has a ROV 1 mo in [place] set for 01/09/14 @ 3:30 PM.  Nothing further needed at this time.  Antionette Fairy

## 2013-12-18 NOTE — Patient Instructions (Signed)
We will schedule you for a CT to look for blood clots Labs done today Have full pfts and six minute walk Follow up in 1 month with Dr. Delford Field in [place]

## 2013-12-18 NOTE — Telephone Encounter (Signed)
This appointment was resched to 01/16/14 @ 2:00 PM d/t pt's change in & PFT scheduled in [place] on 01/15/14.  Pt verbalized understanding & states nothing further needed at this time.  Antionette Fairy

## 2013-12-18 NOTE — Progress Notes (Signed)
Subjective:    Patient ID: [name] [name], female    DOB: [birth year], 80 y.o.   MRN: [id number]  HPI  80 y.o. WF   12/18/2013 Chief Complaint  Patient presents with  . Acute Visit    requested by Dr. [name] for increased DOE x 1 month.  Occas nonprod cough. No wheezing or chest tightness/pain.   Pt feels exhausted. More dyspnea. No real cough. ? If med induced. No chest pain or tightness. No wheeze.  No pain in legs. Venous doppler Neg for DVT 08/2013   No f/c/s.  Past Medical History  Diagnosis Date  . IBS (irritable bowel syndrome)   . Diverticulosis   . Colon polyps   . High cholesterol   . MI (myocardial infarction) ?? probably not 1975    a. 1975 Reported MI;  b. 2011 Cath: nonobs dzs;  c. 2013 Nonischemic Myoview;  d. 12/2012 Echo: EF 60-65%, Gr1 DD.  Marland Kitchen GERD (gastroesophageal reflux disease)   . Hypertension   . Nephrolithiasis   . Bronchiectasis, non-tuberculous     a. Followed by Dr. Delford Field.  Marland Kitchen DVT (deep venous thrombosis)     Resolved, no anticoagulation currently  . 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 Palpitation     a. 2010 Event Monitor: PACs and PVCs  . Pulmonary fibrosis   . First degree AV block     Conduction system  . Wide-complex tachycardia     a. Followed by Dr. [name], "I think this is VT, but i am not sure QRSd 135 1:1 AV."  . Pulmonary embolism     Following knee surgery with a prior history  . COPD (chronic obstructive pulmonary disease)     past hx with pneumonia  . Stroke      Family History  Problem Relation Age of Onset  . Stomach cancer Brother   . Heart disease Father   . Heart attack Mother   . Other Son     Triple Bypass     History   Social History  . Marital Status: Married    Spouse Name: N/A    Number of Children: 3  . Years of Education: N/A   Occupational History  . Retired     [employer]   Social History Main Topics  . Smoking status: Never Smoker   . Smokeless tobacco: Never Used   . Alcohol Use: No  . Drug Use: No  . Sexual Activity: Not on file   Other Topics Concern  . Not on file   Social History Narrative  . No narrative on file     Allergies  Allergen Reactions  . Amiodarone     Did not tolerate  . Crestor [Rosuvastatin] Other (See Comments)    Myalgia  . Darvon     Drop in  BP     Outpatient Prescriptions Prior to Visit  Medication Sig Dispense Refill  . atorvastatin (LIPITOR) 20 MG tablet Take 20 mg by mouth Daily.       . hydrochlorothiazide (HYDRODIURIL) 25 MG tablet Take 0.5 tablets (12.5 mg total) by mouth daily.  30 tablet  3  . nebivolol (BYSTOLIC) 5 MG tablet Take one tablet by mouth daily  30 tablet  3  . omeprazole (PRILOSEC) 40 MG capsule Take 1 capsule by mouth as needed.       No facility-administered medications prior to visit.     Review of Systems  Constitutional: Positive for fatigue. Negative for fever, chills, diaphoresis,  activity change, appetite change and unexpected weight change.  HENT: Negative for congestion, dental problem, ear discharge, ear pain, facial swelling, hearing loss, mouth sores, nosebleeds, postnasal drip, rhinorrhea, sinus pressure, sneezing, sore throat, tinnitus, trouble swallowing and voice change.   Eyes: Negative for photophobia, discharge, itching and visual disturbance.  Respiratory: Positive for cough, chest tightness and shortness of breath. Negative for apnea, choking, wheezing and stridor.   Cardiovascular: Negative for chest pain, palpitations and leg swelling.  Gastrointestinal: Negative for nausea, vomiting, abdominal pain, constipation, blood in stool and abdominal distention.  Genitourinary: Negative for dysuria, urgency, frequency, hematuria, flank pain, decreased urine volume and difficulty urinating.  Musculoskeletal: Positive for joint swelling. Negative for arthralgias, back pain, gait problem, myalgias, neck pain and neck stiffness.  Skin: Negative for color change, pallor and rash.   Neurological: Positive for weakness. Negative for dizziness, tremors, seizures, syncope, speech difficulty, light-headedness, numbness and headaches.  Hematological: Negative for adenopathy. Does not bruise/bleed easily.  Psychiatric/Behavioral: Negative for confusion, sleep disturbance and agitation. The patient is not nervous/anxious.        Objective:   Physical Exam  Filed Vitals:   12/18/13 1157  BP: 130/80  Pulse: 59  Temp: 97.6 F (36.4 C)  TempSrc: Oral  Height: 5\' 6"  (1.676 m)  Weight: 152 lb 3.2 oz (69.037 kg)  SpO2: 98%    Gen: Pleasant, well-nourished, in no distress,  normal affect  ENT: No lesions,  mouth clear,  oropharynx clear, no postnasal drip  Neck: No JVD, no TMG, no carotid bruits  Lungs: No use of accessory muscles, no dullness to percussion, clear without rales or rhonchi  Cardiovascular: RRR, heart sounds normal, no murmur or gallops, no peripheral edema  Abdomen: soft and NT, no HSM,  BS normal  Musculoskeletal: No deformities, no cyanosis or clubbing  Neuro: alert, non focal  Skin: Warm, no lesions or rashes     Assessment & Plan:   Bronchiectasis, non-tuberculous Hx of bronchiectasis and MAC colonization since 2013. Now more progressive dyspnea either d/t airway dz or r/o recurrent pulmonary embolism.  Note recent venous doppler of LE was neg for DVT Plan CT Angio of chest  Full PFTs  History of pulmonary embolism Hx of PE 2013, off anticoagulants since late 2014 ?recurrence Plan rescan chest    Updated Medication List Outpatient Encounter Prescriptions as of 12/18/2013  Medication Sig  . atorvastatin (LIPITOR) 20 MG tablet Take 20 mg by mouth Daily.   . hydrochlorothiazide (HYDRODIURIL) 25 MG tablet Take 0.5 tablets (12.5 mg total) by mouth daily.  . nebivolol (BYSTOLIC) 5 MG tablet Take one tablet by mouth daily  . omeprazole (PRILOSEC) 40 MG capsule Take 1 capsule by mouth as needed.

## 2013-12-21 ENCOUNTER — Telehealth: Payer: Self-pay | Admitting: Critical Care Medicine

## 2013-12-21 NOTE — Telephone Encounter (Signed)
Dr. Delford Field did you call this pt about CT results? Carron Curie, CMA

## 2013-12-21 NOTE — Telephone Encounter (Signed)
Yes ,  i did  No answer  Tell her CT Shows inflammation in lung  Continue meds as per ov

## 2013-12-21 NOTE — Telephone Encounter (Signed)
Called, spoke with pt - Informed her of below results and recs per Dr. Wright.  She verbalized understanding and voiced no further questions or concerns at this time. 

## 2014-01-03 ENCOUNTER — Encounter: Payer: Medicare HMO | Admitting: Internal Medicine

## 2014-01-15 ENCOUNTER — Ambulatory Visit (INDEPENDENT_AMBULATORY_CARE_PROVIDER_SITE_OTHER): Payer: Medicare HMO | Admitting: Critical Care Medicine

## 2014-01-15 DIAGNOSIS — R0609 Other forms of dyspnea: Secondary | ICD-10-CM

## 2014-01-15 DIAGNOSIS — R0989 Other specified symptoms and signs involving the circulatory and respiratory systems: Secondary | ICD-10-CM

## 2014-01-15 DIAGNOSIS — R0600 Dyspnea, unspecified: Secondary | ICD-10-CM

## 2014-01-15 DIAGNOSIS — R0602 Shortness of breath: Secondary | ICD-10-CM

## 2014-01-15 NOTE — Progress Notes (Signed)
PFT done today. 

## 2014-01-16 ENCOUNTER — Ambulatory Visit (INDEPENDENT_AMBULATORY_CARE_PROVIDER_SITE_OTHER): Payer: Medicare HMO | Admitting: Critical Care Medicine

## 2014-01-16 ENCOUNTER — Encounter: Payer: Self-pay | Admitting: Critical Care Medicine

## 2014-01-16 VITALS — BP 122/68 | HR 53 | Temp 99.4°F | Ht 66.0 in | Wt 153.0 lb

## 2014-01-16 DIAGNOSIS — J479 Bronchiectasis, uncomplicated: Secondary | ICD-10-CM

## 2014-01-16 MED ORDER — BUDESONIDE-FORMOTEROL FUMARATE 160-4.5 MCG/ACT IN AERO: 2.0000 | INHALATION_SPRAY | Freq: Two times a day (BID) | RESPIRATORY_TRACT | Status: DC

## 2014-01-16 NOTE — Patient Instructions (Signed)
Symbicort two puff twice daily Return 6 weeks [place]

## 2014-01-16 NOTE — Progress Notes (Signed)
Subjective:    Patient ID: [name] [name], female    DOB: Oct 26, 1924, [age] y.o.   MRN: [id number]  HPI  80 y.o. WF   01/16/2014 Chief Complaint  Patient presents with  . 1 month follow up    Had PFTs and SMW yesterday.  Breathing is unchanged.  No wheezing, chest tightness, or cough.  had pfts.  No change in dyspnea. No mucus now to bring up CT scan neg for PE  6mw abn.  desat to 86% with 33257m. Pfts: abn with obstruction Past Medical History  Diagnosis Date  . IBS (irritable bowel syndrome)   . Diverticulosis   . Colon polyps   . High cholesterol   . MI (myocardial infarction) ?? probably not 1975    a. 1975 Reported MI;  b. 2011 Cath: nonobs dzs;  c. 2013 Nonischemic Myoview;  d. 12/2012 Echo: EF 60-65%, Gr1 DD.  Marland Kitchen. GERD (gastroesophageal reflux disease)   . Hypertension   . Nephrolithiasis   . Bronchiectasis, non-tuberculous     a. Followed by Dr. Delford FieldWright.  Marland Kitchen. DVT (deep venous thrombosis)     Resolved, no anticoagulation currently  . 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 Palpitation     a. 2010 Event Monitor: PACs and PVCs  . Pulmonary fibrosis   . First degree AV block     Conduction system  . Wide-complex tachycardia     a. Followed by Dr. Graciela HusbandsKlein, "I think this is VT, but i am not sure QRSd 135 1:1 AV."  . Pulmonary embolism     Following knee surgery with a prior history  . COPD (chronic obstructive pulmonary disease)     past hx with pneumonia  . Stroke      Family History  Problem Relation Age of Onset  . Stomach cancer Brother   . Heart disease Father   . Heart attack Mother   . Other Son     Triple Bypass     History   Social History  . Marital Status: Married    Spouse Name: N/A    Number of Children: 3  . Years of Education: N/A   Occupational History  . Retired     [employer]   Social History Main Topics  . Smoking status: Never Smoker   . Smokeless tobacco: Never Used  . Alcohol Use: No  . Drug Use: No  .  Sexual Activity: Not on file   Other Topics Concern  . Not on file   Social History Narrative  . No narrative on file     Allergies  Allergen Reactions  . Amiodarone     Did not tolerate  . Crestor [Rosuvastatin] Other (See Comments)    Myalgia  . Darvon     Drop in  BP  . Prednisone Other (See Comments)    polyuria     Outpatient Prescriptions Prior to Visit  Medication Sig Dispense Refill  . atorvastatin (LIPITOR) 20 MG tablet Take 20 mg by mouth Daily.       . hydrochlorothiazide (HYDRODIURIL) 25 MG tablet Take 0.5 tablets (12.5 mg total) by mouth daily.  30 tablet  3  . nebivolol (BYSTOLIC) 5 MG tablet Take one tablet by mouth daily  30 tablet  3  . omeprazole (PRILOSEC) 40 MG capsule Take 1 capsule by mouth as needed.       No facility-administered medications prior to visit.     Review of Systems  Constitutional: Positive for fatigue. Negative  for fever, chills, diaphoresis, activity change, appetite change and unexpected weight change.  HENT: Negative for congestion, dental problem, ear discharge, ear pain, facial swelling, hearing loss, mouth sores, nosebleeds, postnasal drip, rhinorrhea, sinus pressure, sneezing, sore throat, tinnitus, trouble swallowing and voice change.   Eyes: Negative for photophobia, discharge, itching and visual disturbance.  Respiratory: Positive for cough, chest tightness and shortness of breath. Negative for apnea, choking, wheezing and stridor.   Cardiovascular: Negative for chest pain, palpitations and leg swelling.  Gastrointestinal: Negative for nausea, vomiting, abdominal pain, constipation, blood in stool and abdominal distention.  Genitourinary: Negative for dysuria, urgency, frequency, hematuria, flank pain, decreased urine volume and difficulty urinating.  Musculoskeletal: Positive for joint swelling. Negative for arthralgias, back pain, gait problem, myalgias, neck pain and neck stiffness.  Skin: Negative for color change, pallor  and rash.  Neurological: Positive for weakness. Negative for dizziness, tremors, seizures, syncope, speech difficulty, light-headedness, numbness and headaches.  Hematological: Negative for adenopathy. Does not bruise/bleed easily.  Psychiatric/Behavioral: Negative for confusion, sleep disturbance and agitation. The patient is not nervous/anxious.        Objective:   Physical Exam  Filed Vitals:   01/16/14 1410  BP: 122/68  Pulse: 53  Temp: 99.4 F (37.4 C)  TempSrc: Oral  Height: 5\' 6"  (1.676 m)  Weight: 153 lb (69.4 kg)  SpO2: 95%    Gen: Pleasant, well-nourished, in no distress,  normal affect  ENT: No lesions,  mouth clear,  oropharynx clear, no postnasal drip  Neck: No JVD, no TMG, no carotid bruits  Lungs: No use of accessory muscles, no dullness to percussion, clear without rales or rhonchi  Cardiovascular: RRR, heart sounds normal, no murmur or gallops, no peripheral edema  Abdomen: soft and NT, no HSM,  BS normal  Musculoskeletal: No deformities, no cyanosis or clubbing  Neuro: alert, non focal  Skin: Warm, no lesions or rashes  Repeat PFTs 01/2014: progressive airway obstruction  FeV1 78%  Fef 25 75 53% TLC 73%  DLCO 61%     Assessment & Plan:   Bronchiectasis, non-tuberculous Now ongoing airway obstruction with bronchiectasis and mac colonization Plan Symbicort two puff twice daily Return 6 weeks [place]     Updated Medication List Outpatient Encounter Prescriptions as of 01/16/2014  Medication Sig  . atorvastatin (LIPITOR) 20 MG tablet Take 20 mg by mouth Daily.   . hydrochlorothiazide (HYDRODIURIL) 25 MG tablet Take 0.5 tablets (12.5 mg total) by mouth daily.  . nebivolol (BYSTOLIC) 5 MG tablet Take one tablet by mouth daily  . omeprazole (PRILOSEC) 40 MG capsule Take 1 capsule by mouth as needed.  . budesonide-formoterol (SYMBICORT) 160-4.5 MCG/ACT inhaler Inhale 2 puffs into the lungs 2 (two) times daily.

## 2014-01-19 NOTE — Addendum Note (Signed)
Addended by: Shan Levans E on: 01/19/2014 09:16 AM   Modules accepted: Level of Service

## 2014-01-19 NOTE — Assessment & Plan Note (Signed)
Now ongoing airway obstruction with bronchiectasis and mac colonization Plan Symbicort two puff twice daily Return 6 weeks [place]

## 2014-01-22 LAB — PULMONARY FUNCTION TEST
DL/VA % pred: 90 %
DL/VA: 4.58 ml/min/mmHg/L
DLCO UNC: 16.55 ml/min/mmHg
DLCO unc % pred: 61 %
FEF 25-75 Post: 1.02 L/sec
FEF 25-75 Pre: 0.67 L/sec
FEF2575-%CHANGE-POST: 53 %
FEF2575-%PRED-PRE: 59 %
FEF2575-%Pred-Post: 91 %
FEV1-%Change-Post: 12 %
FEV1-%Pred-Post: 78 %
FEV1-%Pred-Pre: 70 %
FEV1-POST: 1.46 L
FEV1-Pre: 1.3 L
FEV1FVC-%CHANGE-POST: 0 %
FEV1FVC-%Pred-Pre: 91 %
FEV6-%CHANGE-POST: 11 %
FEV6-%PRED-POST: 90 %
FEV6-%PRED-PRE: 81 %
FEV6-Post: 2.14 L
FEV6-Pre: 1.92 L
FEV6FVC-%Change-Post: 0 %
FEV6FVC-%Pred-Post: 103 %
FEV6FVC-%Pred-Pre: 104 %
FVC-%Change-Post: 11 %
FVC-%PRED-POST: 87 %
FVC-%Pred-Pre: 78 %
FVC-POST: 2.19 L
FVC-PRE: 1.97 L
POST FEV1/FVC RATIO: 66 %
POST FEV6/FVC RATIO: 97 %
Pre FEV1/FVC ratio: 66 %
Pre FEV6/FVC Ratio: 98 %
RV % pred: 65 %
RV: 1.73 L
TLC % PRED: 73 %
TLC: 3.93 L

## 2014-02-27 ENCOUNTER — Ambulatory Visit: Payer: Medicare HMO | Admitting: Critical Care Medicine

## 2014-10-25 ENCOUNTER — Other Ambulatory Visit: Payer: Self-pay

## 2014-10-25 MED ORDER — NEBIVOLOL HCL 5 MG PO TABS: ORAL_TABLET | ORAL | Status: DC

## 2014-12-03 ENCOUNTER — Ambulatory Visit: Payer: Medicare HMO | Admitting: Critical Care Medicine

## 2014-12-11 ENCOUNTER — Ambulatory Visit (INDEPENDENT_AMBULATORY_CARE_PROVIDER_SITE_OTHER): Payer: Medicare HMO | Admitting: Critical Care Medicine

## 2014-12-11 ENCOUNTER — Encounter: Payer: Self-pay | Admitting: Critical Care Medicine

## 2014-12-11 VITALS — BP 134/68 | HR 62 | Temp 97.1°F | Ht 66.0 in | Wt 147.0 lb

## 2014-12-11 DIAGNOSIS — J849 Interstitial pulmonary disease, unspecified: Secondary | ICD-10-CM

## 2014-12-11 DIAGNOSIS — J471 Bronchiectasis with (acute) exacerbation: Secondary | ICD-10-CM

## 2014-12-11 DIAGNOSIS — J479 Bronchiectasis, uncomplicated: Secondary | ICD-10-CM

## 2014-12-11 DIAGNOSIS — J449 Chronic obstructive pulmonary disease, unspecified: Secondary | ICD-10-CM

## 2014-12-11 MED ORDER — TIOTROPIUM BROMIDE MONOHYDRATE 2.5 MCG/ACT IN AERS: INHALATION_SPRAY | RESPIRATORY_TRACT | Status: DC

## 2014-12-11 MED ORDER — AZITHROMYCIN 250 MG PO TABS: ORAL_TABLET | ORAL | Status: DC

## 2014-12-11 MED ORDER — BECLOMETHASONE DIPROPIONATE 80 MCG/ACT IN AERS: 2.0000 | INHALATION_SPRAY | Freq: Two times a day (BID) | RESPIRATORY_TRACT | Status: DC

## 2014-12-11 NOTE — Patient Instructions (Addendum)
Finish cefuroxime Start azithromycin 250mg  Take two once then one daily until gone Start spiriva respimat two puff daily Start Qvar two puff twice daily Use Delsym OTC for cough Return 6 weeks

## 2014-12-11 NOTE — Assessment & Plan Note (Addendum)
D/T fume exposure, now MAC colonization 11/2011 PFTs 04/13/2012  FeV1 84% FVC 84%  TLC 82%  DLCO 83% PFT 01/2014: FeV1 78%  Fef 25 75 53% TLC 73%  DLCO 61% CXR 04/13/2012  No active disease No evidence for amiodarone toxicity  Now recurrent bilateral right and left midlung zone inflammation with bronchiectasis. Likely with Mycobacterium colonization. However acute recent flare and symptom complex with hospitalization. Chest pain syndrome more pulmonary and noncardiac with negative stress Myoview. Plan Finish cefuroxime Start azithromycin 250mg  Take two once then one daily until gone Start spiriva respimat two puff daily Start Qvar two puff twice daily Use Delsym OTC for cough Return 6 weeks  I had an extended discussion with the patient reviewing all relevant studies completed to date and  lasting 15 to 20 minutes of a 25 minute visit on the following ongoing concerns:   Review of symptom complex, diagnoses, and treatment plan. Note patient given Spiriva and Qvar to avoid long acting beta agonist which induced hypertension in the past

## 2014-12-11 NOTE — Progress Notes (Signed)
Subjective:    Patient ID: MAECI KALBFLEISCH, female    DOB: 1924/01/22, 81 y.o.   MRN: [id number]  HPI 81 y.o. WF   12/11/2014 Chief Complaint  Patient presents with  . Follow-up    At [facility] last wk.(there for 2 days),had stress test said lungs.Dr. Sol Passer wanted to f/u here,sob with exertion worse,cough slightly prod.-green usually dry.Energy level has none.Midchest tightness,low-grade fever occass.,occass. wheezes  Pt just in hosp 1 week ago.  Pt had episode of dyspnea and chest pressure. Stress test neg.  Then Dx PNA.  Pt d/c on ABX.  Pt has some left.  Pt saw PCP 12/10/14: wanted to come to pulm today. Hx of toxic fumes exposure in 1980s.   Pt now off symbicort.  Pt last seen 01/2014: no f/u.   Symbicort causee BP to be too high.  Now off all inhalers. Now notes some cough but no mucus.  Cough going on since 08/2014.  Cough is dry and min productive.  No chst pain, just pressure.  No energy.  Notes wheezing, notes low grade fever. Dyspnea at rest, nots some green mucus.  CT chest was done 12/05/14: RML and L lingular area with inflammation.  Pt was admitted 1/27 until 1/28    Review of Systems  Constitutional: Positive for fatigue. Negative for fever, chills, diaphoresis, activity change, appetite change and unexpected weight change.  HENT: Negative for congestion, dental problem, ear discharge, ear pain, facial swelling, hearing loss, mouth sores, nosebleeds, postnasal drip, rhinorrhea, sinus pressure, sneezing, sore throat, tinnitus, trouble swallowing and voice change.   Eyes: Negative for photophobia, discharge, itching and visual disturbance.  Respiratory: Positive for cough, chest tightness and shortness of breath. Negative for apnea, choking, wheezing and stridor.   Cardiovascular: Negative for chest pain, palpitations and leg swelling.  Gastrointestinal: Negative for nausea, vomiting, abdominal pain, constipation, blood in stool and abdominal distention.  Genitourinary: Negative  for dysuria, urgency, frequency, hematuria, flank pain, decreased urine volume and difficulty urinating.  Musculoskeletal: Positive for joint swelling. Negative for myalgias, back pain, arthralgias, gait problem, neck pain and neck stiffness.  Skin: Negative for color change, pallor and rash.  Neurological: Positive for weakness. Negative for dizziness, tremors, seizures, syncope, speech difficulty, light-headedness, numbness and headaches.  Hematological: Negative for adenopathy. Does not bruise/bleed easily.  Psychiatric/Behavioral: Negative for confusion, sleep disturbance and agitation. The patient is not nervous/anxious.        Objective:   Physical Exam Filed Vitals:   12/11/14 1447  BP: 134/68  Pulse: 62  Temp: 97.1 F (36.2 C)  TempSrc: Oral  Height:  (1.676 m)  Weight: 147 lb (66.679 kg)  SpO2: 93%    Gen: Pleasant, well-nourished, in no distress,  normal affect  ENT: No lesions,  mouth clear,  oropharynx clear, no postnasal drip  Neck: No JVD, no TMG, no carotid bruits  Lungs: No use of accessory muscles, no dullness to percussion, bilateral expired wheezes left and right midlung zone with mild rhonchi  Cardiovascular: RRR, heart sounds normal, no murmur or gallops, no peripheral edema  Abdomen: soft and NT, no HSM,  BS normal  Musculoskeletal: No deformities, no cyanosis or clubbing  Neuro: alert, non focal  Skin: Warm, no lesions or rashes  CT scan of chest from 12/05/2014: Stable scarring right middle lung zone and inferior left lower lobe with stable clustered nodularity again seen in right lower lobe consistent with Mycobacterium infection     Assessment & Plan:   Bronchiectasis, non-tuberculous  D/T fume exposure, now MAC colonization 11/2011 PFTs 04/13/2012  FeV1 84% FVC 84%  TLC 82%  DLCO 83% PFT 01/2014: FeV1 78%  Fef 25 75 53% TLC 73%  DLCO 61% CXR 04/13/2012  No active disease No evidence for amiodarone toxicity  Now recurrent bilateral right  and left midlung zone inflammation with bronchiectasis. Likely with Mycobacterium colonization. However acute recent flare and symptom complex with hospitalization. Chest pain syndrome more pulmonary and noncardiac with negative stress Myoview. Plan Finish cefuroxime Start azithromycin 250mg  Take two once then one daily until gone Start spiriva respimat two puff daily Start Qvar two puff twice daily Use Delsym OTC for cough Return 6 weeks  I had an extended discussion with the patient reviewing all relevant studies completed to date and  lasting 15 to 20 minutes of a 25 minute visit on the following ongoing concerns:   Review of symptom complex, diagnoses, and treatment plan. Note patient given Spiriva and Qvar to avoid long acting beta agonist which induced hypertension in the past     Updated Medication List Outpatient Encounter Prescriptions as of 12/11/2014  Medication Sig  . atorvastatin (LIPITOR) 20 MG tablet Take 20 mg by mouth Daily.   . cefUROXime (CEFTIN) 500 MG tablet Take 500 mg by mouth 2 (two) times daily.  . nebivolol (BYSTOLIC) 5 MG tablet Take one tablet by mouth daily  . omeprazole (PRILOSEC) 40 MG capsule Take 1 capsule by mouth as needed.  Marland Kitchen azithromycin (ZITHROMAX) 250 MG tablet Take two once then one daily until gone  . beclomethasone (QVAR) 80 MCG/ACT inhaler Inhale 2 puffs into the lungs 2 (two) times daily.  . diclofenac (VOLTAREN) 75 MG EC tablet As needed  . hydrochlorothiazide (HYDRODIURIL) 25 MG tablet Take 0.5 tablets (12.5 mg total) by mouth daily. (Patient not taking: Reported on 12/11/2014)  . Tiotropium Bromide Monohydrate (SPIRIVA RESPIMAT) 2.5 MCG/ACT AERS Two puff daily  . traMADol (ULTRAM) 50 MG tablet As needed  . [DISCONTINUED] budesonide-formoterol (SYMBICORT) 160-4.5 MCG/ACT inhaler Inhale 2 puffs into the lungs 2 (two) times daily. (Patient not taking: Reported on 12/11/2014)

## 2015-01-22 ENCOUNTER — Ambulatory Visit (INDEPENDENT_AMBULATORY_CARE_PROVIDER_SITE_OTHER): Payer: Medicare HMO | Admitting: Critical Care Medicine

## 2015-01-22 ENCOUNTER — Encounter: Payer: Self-pay | Admitting: Critical Care Medicine

## 2015-01-22 VITALS — BP 152/68 | HR 56 | Temp 96.3°F | Ht 66.0 in | Wt 151.2 lb

## 2015-01-22 DIAGNOSIS — J479 Bronchiectasis, uncomplicated: Secondary | ICD-10-CM

## 2015-01-22 DIAGNOSIS — A310 Pulmonary mycobacterial infection: Secondary | ICD-10-CM

## 2015-01-22 DIAGNOSIS — J449 Chronic obstructive pulmonary disease, unspecified: Secondary | ICD-10-CM

## 2015-01-22 DIAGNOSIS — J471 Bronchiectasis with (acute) exacerbation: Secondary | ICD-10-CM

## 2015-01-22 MED ORDER — FLUTTER DEVI: Status: DC

## 2015-01-22 NOTE — Progress Notes (Signed)
Subjective:    Patient ID: [name] [name], female    DOB: Oct 08, 1924, 81 y.o.   MRN: [id number]  HPI 81 y.o. WF   01/22/2015 Chief Complaint  Patient presents with  . Follow-up    Feeling the same,still has cough-green,feels anxious b/c niece has lung ca,sob-same, no energy level,appetite good,no wheezing, denies cp or tightness, no fcs  On qvar and spiriva respimat.  Pt knows how to operate them .  Pt did notice reduction in mucus and then returned.  Pt notes cough worse in the AM, not a lot of mucus.  No real energy.  Pt notes dyspnea is the same.  Pt does not have a flutter valve.   Review of Systems  Constitutional: Positive for fatigue. Negative for fever, chills, diaphoresis, activity change, appetite change and unexpected weight change.  HENT: Negative for congestion, dental problem, ear discharge, ear pain, facial swelling, hearing loss, mouth sores, nosebleeds, postnasal drip, rhinorrhea, sinus pressure, sneezing, sore throat, tinnitus, trouble swallowing and voice change.   Eyes: Negative for photophobia, discharge, itching and visual disturbance.  Respiratory: Positive for cough, chest tightness and shortness of breath. Negative for apnea, choking, wheezing and stridor.   Cardiovascular: Negative for chest pain, palpitations and leg swelling.  Gastrointestinal: Negative for nausea, vomiting, abdominal pain, constipation, blood in stool and abdominal distention.  Genitourinary: Negative for dysuria, urgency, frequency, hematuria, flank pain, decreased urine volume and difficulty urinating.  Musculoskeletal: Positive for joint swelling. Negative for myalgias, back pain, arthralgias, gait problem, neck pain and neck stiffness.  Skin: Negative for color change, pallor and rash.  Neurological: Positive for weakness. Negative for dizziness, tremors, seizures, syncope, speech difficulty, light-headedness, numbness and headaches.  Hematological: Negative for adenopathy. Does not  bruise/bleed easily.  Psychiatric/Behavioral: Negative for confusion, sleep disturbance and agitation. The patient is not nervous/anxious.        Objective:   Physical Exam Filed Vitals:   01/22/15 0931  BP: 152/68  Pulse: 56  Temp: 96.3 F (35.7 C)  TempSrc: Oral  Height:  (1.676 m)  Weight: 151 lb 3.2 oz (68.584 kg)  SpO2: 96%    Gen: Pleasant, well-nourished, in no distress,  normal affect  ENT: No lesions,  mouth clear,  oropharynx clear, no postnasal drip  Neck: No JVD, no TMG, no carotid bruits  Lungs: No use of accessory muscles, no dullness to percussion, bilateral expired wheezes left and right midlung zone with mild rhonchi  Cardiovascular: RRR, heart sounds normal, no murmur or gallops, no peripheral edema  Abdomen: soft and NT, no HSM,  BS normal  Musculoskeletal: No deformities, no cyanosis or clubbing  Neuro: alert, non focal  Skin: Warm, no lesions or rashes  CT scan of chest from 12/05/2014: Stable scarring right middle lung zone and inferior left lower lobe with stable clustered nodularity again seen in right lower lobe consistent with Mycobacterium infection     Assessment & Plan:   Bronchiectasis, non-tuberculous Bronchiectasis and prob MAI recurrence  Plan Stay on inhalers Obtain a sputum culture for routine and AFB Use flutter valve 3-4 times per day No other changes for now. 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 atorvastatin (LIPITOR) 20 MG tablet Take 20 mg by mouth Daily.   . beclomethasone (QVAR) 80 MCG/ACT inhaler Inhale  2 puffs into the lungs 2 (two) times daily.  . D3-50 50000 UNITS capsule Take by mouth once a week.   . nebivolol (BYSTOLIC) 5 MG tablet Take one tablet by mouth daily  . omeprazole  (PRILOSEC) 40 MG capsule Take 1 capsule by mouth as needed.  . Tiotropium Bromide Monohydrate (SPIRIVA RESPIMAT) 2.5 MCG/ACT AERS Two puff daily  . hydrochlorothiazide (HYDRODIURIL) 25 MG tablet Take 0.5 tablets (12.5 mg total) by mouth daily. (Patient not taking: Reported on 12/11/2014)  . Respiratory Therapy Supplies (FLUTTER) DEVI Use 3-4 times per day  . traMADol (ULTRAM) 50 MG tablet As needed  . [DISCONTINUED] azithromycin (ZITHROMAX) 250 MG tablet Take two once then one daily until gone (Patient not taking: Reported on 01/22/2015)  . [DISCONTINUED] cefUROXime (CEFTIN) 500 MG tablet Take 500 mg by mouth 2 (two) times daily.  . [DISCONTINUED] diclofenac (VOLTAREN) 75 MG EC tablet As needed  . [DISCONTINUED] Respiratory Therapy Supplies (FLUTTER) DEVI Use 3-4 times per day

## 2015-01-22 NOTE — Patient Instructions (Signed)
Stay on inhalers Obtain a sputum culture for routine and AFB Use flutter valve 3-4 times per day No other changes for now. May order more antibiotics based on culture results Return 1 month

## 2015-01-24 NOTE — Assessment & Plan Note (Signed)
prob MAI recurrence Repeat sputum AFB

## 2015-01-24 NOTE — Assessment & Plan Note (Signed)
Bronchiectasis and prob MAI recurrence  Plan Stay on inhalers Obtain a sputum culture for routine and AFB Use flutter valve 3-4 times per day No other changes for now. May order more antibiotics based on culture results Return 1 month

## 2015-01-29 ENCOUNTER — Telehealth: Payer: Self-pay | Admitting: Critical Care Medicine

## 2015-01-29 DIAGNOSIS — J471 Bronchiectasis with (acute) exacerbation: Secondary | ICD-10-CM

## 2015-01-29 MED ORDER — FLUTTER DEVI: Status: DC

## 2015-01-29 NOTE — Telephone Encounter (Signed)
Rx for flutter valve with dx code has been faxed in. Nothing further was needed.

## 2015-02-03 ENCOUNTER — Telehealth: Payer: Self-pay | Admitting: Critical Care Medicine

## 2015-02-03 DIAGNOSIS — A310 Pulmonary mycobacterial infection: Secondary | ICD-10-CM

## 2015-02-03 NOTE — Telephone Encounter (Signed)
Let pt know sputum is growing pseudomonas S to levaquin.  Start Levoquin 500mg  daily x 7 days

## 2015-02-04 MED ORDER — LEVOFLOXACIN 500 MG PO TABS: 500.0000 mg | ORAL_TABLET | Freq: Every day | ORAL | Status: DC

## 2015-02-04 NOTE — Telephone Encounter (Signed)
Spoke with family member.  He will ask pt to call office back.

## 2015-02-04 NOTE — Telephone Encounter (Signed)
Spoke with the pt and notified of results per PW  She verbalized understanding  Rx was sent to pharm

## 2015-02-04 NOTE — Telephone Encounter (Signed)
Pt returning call.[name] [name] ° °

## 2015-02-20 ENCOUNTER — Encounter: Payer: Self-pay | Admitting: Critical Care Medicine

## 2015-02-26 ENCOUNTER — Ambulatory Visit (INDEPENDENT_AMBULATORY_CARE_PROVIDER_SITE_OTHER): Payer: Self-pay | Admitting: Critical Care Medicine

## 2015-02-26 ENCOUNTER — Encounter: Payer: Self-pay | Admitting: Critical Care Medicine

## 2015-02-26 VITALS — BP 130/76 | HR 64 | Temp 97.2°F | Ht 66.0 in | Wt 149.2 lb

## 2015-02-26 DIAGNOSIS — J479 Bronchiectasis, uncomplicated: Secondary | ICD-10-CM

## 2015-02-26 DIAGNOSIS — J470 Bronchiectasis with acute lower respiratory infection: Secondary | ICD-10-CM

## 2015-02-26 MED ORDER — CEFEPIME HCL 1 G IJ SOLR: 1.0000 g | Freq: Two times a day (BID) | INTRAMUSCULAR | Status: DC

## 2015-02-26 NOTE — Progress Notes (Signed)
Subjective:    Patient ID: [name] [name], female    DOB: 1924/05/10, 81 y.o.   MRN: [id number]  HPI 81 y.o. [facility]   02/26/2015 Chief Complaint  Patient presents with  . 1 month follow up    Was seen at [facility] on Sunday for increased SOB, cough with clear mucus, chest tightness/soreness, constant, dull pain in upper back, and PND.  Symptoms started on Friday.  Was given po levaquin rx and an inj while in office. Symptoms are worsening.    Pt got better then worsen.  Pt starting coughing.  No real fever. Went to urgent care two days ago.  No CXR done. Rx iabx injection. Rx levaquin. Not improving. Mucus is clear.  Dull pain over the upper back.  Notes more dyspnea.  Notes some qhs dyspnea and cough. Energy is gone.   Pt denies any significant sore throat, nasal congestion or excess secretions, fever, chills, sweats, unintended weight loss, pleurtic or exertional chest pain, orthopnea PND, or leg swelling Pt denies any increase in rescue therapy over baseline, denies waking up needing it or having any early am or nocturnal exacerbations of coughing/wheezing/or dyspnea. Pt also denies any obvious fluctuation in symptoms with  weather or environmental change or other alleviating or aggravating factors  Review of Systems  Constitutional: Positive for fatigue. Negative for fever, chills, diaphoresis, activity change, appetite change and unexpected weight change.  HENT: Negative for congestion, dental problem, ear discharge, ear pain, facial swelling, hearing loss, mouth sores, nosebleeds, postnasal drip, rhinorrhea, sinus pressure, sneezing, sore throat, tinnitus, trouble swallowing and voice change.   Eyes: Negative for photophobia, discharge, itching and visual disturbance.  Respiratory: Positive for cough, chest tightness and shortness of breath. Negative for apnea, choking, wheezing and stridor.   Cardiovascular: Negative for chest pain, palpitations and leg swelling.   Gastrointestinal: Negative for nausea, vomiting, abdominal pain, constipation, blood in stool and abdominal distention.  Genitourinary: Negative for dysuria, urgency, frequency, hematuria, flank pain, decreased urine volume and difficulty urinating.  Musculoskeletal: Positive for joint swelling. Negative for myalgias, back pain, arthralgias, gait problem, neck pain and neck stiffness.  Skin: Negative for color change, pallor and rash.  Neurological: Positive for weakness. Negative for dizziness, tremors, seizures, syncope, speech difficulty, light-headedness, numbness and headaches.  Hematological: Negative for adenopathy. Does not bruise/bleed easily.  Psychiatric/Behavioral: Negative for confusion, sleep disturbance and agitation. The patient is not nervous/anxious.        Objective:   Physical Exam Filed Vitals:   02/26/15 1058  BP: 130/76  Pulse: 64  Temp: 97.2 F (36.2 C)  TempSrc: Oral  Height: 5\' 6"  (1.676 m)  Weight: 149 lb 3.2 oz (67.677 kg)  SpO2: 95%    Gen: Pleasant, well-nourished, in no distress,  normal affect  ENT: No lesions,  mouth clear,  oropharynx clear, no postnasal drip  Neck: No JVD, no TMG, no carotid bruits  Lungs: No use of accessory muscles, no dullness to percussion, bilateral expired wheezes left and right midlung zone with course rhonchi  Cardiovascular: RRR, heart sounds normal, no murmur or gallops, no peripheral edema  Abdomen: soft and NT, no HSM,  BS normal  Musculoskeletal: No deformities, no cyanosis or clubbing  Neuro: alert, non focal  Skin: Warm, no lesions or rashes  CT scan of chest from 12/05/2014: Stable scarring right middle lung zone and inferior left lower lobe with stable clustered nodularity again seen in right lower lobe consistent with Mycobacterium infection  Assessment & Plan:   Bronchiectasis, non-tuberculous Bronchiectasis with acute exacerbation note on current sputum culture the patient has Pseudomonas,    This is a multiresistant organism and might be best treated with IV antibiotics however the patient does not have a caregiver that can help her in the home therefore this patient may need an admission Plan Give consideration to admission for IV antibiotics as the patient cannot receive home IV antibiotics due to lack of support in the home The patient be given consideration to this     Updated Medication List Outpatient Encounter Prescriptions as of 02/26/2015  Medication Sig  . atorvastatin (LIPITOR) 20 MG tablet Take 20 mg by mouth Daily.   . beclomethasone (QVAR) 80 MCG/ACT inhaler Inhale 2 puffs into the lungs 2 (two) times daily.  . D3-50 50000 UNITS capsule Take by mouth once a week.   . hydrochlorothiazide (HYDRODIURIL) 25 MG tablet Take 0.5 tablets (12.5 mg total) by mouth daily.  . nebivolol (BYSTOLIC) 5 MG tablet Take one tablet by mouth daily  . omeprazole (PRILOSEC) 40 MG capsule Take 1 capsule by mouth as needed.  Marland Kitchen Respiratory Therapy Supplies (FLUTTER) DEVI Use 3-4 times per day  . Tiotropium Bromide Monohydrate (SPIRIVA RESPIMAT) 2.5 MCG/ACT AERS Two puff daily  . traMADol (ULTRAM) 50 MG tablet As needed  . [DISCONTINUED] levofloxacin (LEVAQUIN) 500 MG tablet Take 500 mg by mouth daily.  Marland Kitchen ceFEPIme (MAXIPIME) 1 G injection Inject 1 g into the muscle 2 (two) times daily.  . [DISCONTINUED] levofloxacin (LEVAQUIN) 500 MG tablet Take 1 tablet (500 mg total) by mouth daily. (Patient not taking: Reported on 02/26/2015)

## 2015-02-26 NOTE — Patient Instructions (Signed)
Home IV Antibiotics with [facility] for 7 days, cefepime 1 gm twice daily No other changes Return 3 weeks

## 2015-02-27 ENCOUNTER — Telehealth: Payer: Self-pay | Admitting: Critical Care Medicine

## 2015-02-27 MED ORDER — CEFEPIME HCL 1 GM/50ML IV SOLN: 1.0000 g | Freq: Two times a day (BID) | INTRAVENOUS | Status: DC

## 2015-02-27 NOTE — Telephone Encounter (Signed)
Pt will need caregiver to help with administering IV medication.  Pt advised [facility] she would not have this support to help.  Called, spoke with pt.  Since speaking with [facility], she has discussed this with her daughter who will be available to help administer IV abx to pt in the home.  Pt aware daughter will need to be present when [facility] comes to deliver/set up IV for teaching.  Pt in agreement and voiced no further questions or concerns at this time.  Note: Rx printed during OV yesterday was for IM.  Pt needs IV; not IM.  IV abx rx printed.  It has been signed by Dr. Delford Field.  I faxed it to [facility] pharmacy at [phone] and placed original copy in [facility] folder for Melissa to pick up.  Melissa aware of above and will have pharm proceed with IV abx set up.

## 2015-02-27 NOTE — Assessment & Plan Note (Signed)
Bronchiectasis with acute exacerbation note on current sputum culture the patient has Pseudomonas,   This is a multiresistant organism and might be best treated with IV antibiotics however the patient does not have a caregiver that can help her in the home therefore this patient may need an admission Plan Give consideration to admission for IV antibiotics as the patient cannot receive home IV antibiotics due to lack of support in the home The patient be given consideration to this

## 2015-02-28 ENCOUNTER — Ambulatory Visit (INDEPENDENT_AMBULATORY_CARE_PROVIDER_SITE_OTHER): Payer: Medicare HMO | Admitting: Internal Medicine

## 2015-02-28 ENCOUNTER — Encounter: Payer: Self-pay | Admitting: Internal Medicine

## 2015-02-28 ENCOUNTER — Telehealth: Payer: Self-pay | Admitting: Critical Care Medicine

## 2015-02-28 ENCOUNTER — Ambulatory Visit (INDEPENDENT_AMBULATORY_CARE_PROVIDER_SITE_OTHER)
Admission: RE | Admit: 2015-02-28 | Discharge: 2015-02-28 | Disposition: A | Discharge status: Home / Self Care | Payer: Medicare HMO | Source: Ambulatory Visit | Attending: Internal Medicine | Admitting: Internal Medicine

## 2015-02-28 DIAGNOSIS — J479 Bronchiectasis, uncomplicated: Secondary | ICD-10-CM

## 2015-02-28 MED ORDER — TRAMADOL HCL 50 MG PO TABS: ORAL_TABLET | ORAL | Status: DC

## 2015-02-28 NOTE — Telephone Encounter (Signed)
Patient says that she wants to cancel her home care services.  She said that they have not shown up at her house.  I offered to get her help through another home care company since she wasn't happy with this one, but she said she would just prefer not to use one.  She said that she has not had her medication since Tuesday  02/26/15 because she has not had anyone to give it to her.  I schedule patient to come in and see Dr. Sherene Sires today at noon.    FYI to Dr. Delford Field

## 2015-02-28 NOTE — Telephone Encounter (Signed)
Melissa from [facility] called requesting clarification if nurse can try and give pt her IV abx. Laurene Footman, per MWs recs then it is ok to try again to give pt her IV abx. Melissa verbalized understanding and denied any further questions or concerns at this time.

## 2015-02-28 NOTE — Telephone Encounter (Signed)
[name] [name] also called and wanted to talk to [place] in regards to this. [phone] She would like to talk before appt

## 2015-02-28 NOTE — Telephone Encounter (Signed)
Let [facility] I talked to pt and she agrees to give them another try but we expect the pt to be started on rx w/in 24 hours - preferably this afternoon

## 2015-02-28 NOTE — Telephone Encounter (Signed)
Lm for Melissa to call me back.

## 2015-02-28 NOTE — Telephone Encounter (Signed)
Please see other phone note from 02/28/15. Will sign off.

## 2015-02-28 NOTE — Telephone Encounter (Signed)
Spoke to [place]. States that pt is now refusing IV antibiotics ordered by PW. [facility] pharmacy told her that the nurse would be by her house between 8am-10am. They did not show up right at 8am, so the pt is refusing the IV antibiotics. She has upcoming appointment today with MW at 12pm. Will route message to MW to make him aware.

## 2015-02-28 NOTE — Patient Instructions (Signed)
Please remember to go to the x-ray department downstairs for your tests - we will call you with the results when they are available.  Mucinex dm 1200 mg every 12 hours as needed and supplement with tramadol 50 mg up to one every 4 hours as needed   Whenever coughing >>> prilosec 40 mg Take 30-60 min before first meal of the day and pepcid ac 20 mg at bedtime  We will contact [facility] and see if they can start you medication in the next 24 hours and if not we will find a different company who will

## 2015-02-28 NOTE — Progress Notes (Signed)
Subjective:    Patient ID: [name] [name], female    DOB: 02-18-1924, 81 y.o.   MRN: [id number]  HPI 81 y.o. [facility]   02/26/2015 Chief Complaint  Patient presents with  . 1 month follow up    Was seen at [facility] on Sunday for increased SOB, cough with clear mucus, chest tightness/soreness, constant, dull pain in upper back, and PND.  Symptoms started on Friday.  Was given po levaquin rx and an inj while in office. Symptoms are worsening.    Pt got better then worsen.  Pt starting coughing.  No real fever. Went to urgent care two days ago.  No CXR done. Rx iabx injection. Rx levaquin. Not improving. Mucus is clear.  Dull pain over the upper back.  Notes more dyspnea.  Notes some qhs dyspnea and cough. Energy is gone.   rec Check culture > pseudomonas > maxepime home rx    02/28/2015 acute  ov/Beanca Kiester re: bronchiectasis flare Chief Complaint  Patient presents with  . Acute Visit    Pt seen on 02/25/14 for acute visit. IV ABX were ordered and were refused today by the pt from [facility] since there was miscommunication on what time they would arrive. She is requesting to be admitted today for IV ABX.   congested cough with thick yellow mucus not using mucinex or flutter valve  Not able to use hfa adequately No fever, cp, sob at rest.  Main issue is can't rest due to incessant coughing.  No obvious day to day or daytime variabilty or assoc   cp or chest tightness, subjective wheeze overt sinus or hb symptoms. No unusual exp hx or h/o childhood pna/ asthma or knowledge of premature birth.    Also denies any obvious fluctuation of symptoms with weather or environmental changes or other aggravating or alleviating factors except as outlined above   Current Medications, Allergies, Complete Past Medical History, Past Surgical History, Family History, and Social History were reviewed in [employer] record.  ROS  The following are not active complaints unless bolded sore  throat, dysphagia, dental problems, itching, sneezing,  nasal congestion or excess/ purulent secretions, ear ache,   fever, chills, sweats, unintended wt loss, pleuritic or exertional cp, hemoptysis,  orthopnea pnd or leg swelling, presyncope, palpitations, heartburn, abdominal pain, anorexia, nausea, vomiting, diarrhea  or change in bowel or urinary habits, change in stools or urine, dysuria,hematuria,  rash, arthralgias, visual complaints, headache, numbness weakness or ataxia or problems with walking or coordination,  change in mood/affect or memory.          Objective:   Physical Exam   Gen: Pleasant, well-nourished, in no distress,  Anxious/ angry re outpt rx    Wt Readings from Last 3 Encounters:  02/28/15 148 lb (67.132 kg)  02/26/15 149 lb 3.2 oz (67.677 kg)  01/22/15 151 lb 3.2 oz (68.584 kg)    Vital signs reviewed / note bp up      ENT: No lesions,  mouth clear,  oropharynx clear, no postnasal drip  Neck: No JVD, no TMG, no carotid bruits  Lungs: No use of accessory muscles, no dullness to percussion, bilateral expired wheezes left and right midlung zone with course rhonchi  Cardiovascular: RRR, heart sounds normal, no murmur or gallops, no peripheral edema  Abdomen: soft and NT, no HSM,  BS normal  Musculoskeletal: No deformities, no cyanosis or clubbing  Neuro: alert, non focal  Skin: Warm, no lesions or rashes  I personally reviewed images and agree with radiology impression as follows:  CXR:  02/28/15  Bilateral pleural parenchymal scarring. No acute infiltrate. Stable  cardiomegaly. No CHF.        Assessment & Plan:

## 2015-02-28 NOTE — Telephone Encounter (Signed)
Spoke with [name] [name], states that she was able to get more information regarding the situation with the patient's IV abx appt today.  Pt was supposed to have an appt with [facility] this morning at 8am-10am - they were supposed to come out to her house and were not able to make it right at 8am.  Per [facility], an email has been sent to her from [facility]) stating that they made multiple attempts to fix the issue with the patient and even offered to go out to her home this morning at 9:40 to administer the IV abx and patient refused that as well. Melissa wants to make Dr Sherene Sires aware of this and that the patient has refused to allow [facility] to come out to her home.   Pt has appt with [facility] at 12pm today. Per [name] [name], they are still wanting to have the patient on their service if the patient will allow.  Will send to him to make him aware.  Nothing further needed.

## 2015-02-28 NOTE — Telephone Encounter (Signed)
Melissa called back to talk to nurse again. [phone]

## 2015-03-01 ENCOUNTER — Encounter: Payer: Self-pay | Admitting: Internal Medicine

## 2015-03-01 NOTE — Assessment & Plan Note (Addendum)
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 Improved effectiveness after extensive coaching during this visit to a level of approximately  50% from a baseline of < 25%  I had an extended discussion with the patient reviewing all relevant studies completed to date and  lasting 15 to 20 minutes of a 25 minute visit on the following ongoing concerns:   1) very little evidence of active severe or acute  infection but she appears to be colonized by pseudomonas contributing to her cough  2) agree with trial of maxipime x at least a 7 days course of abx but this should be done as outpt, not inpt status  3) she agreed to try advanced services again and if not happy change to another provider  4) in meantime will see if can help with symptoms by A) teaching mdi B) max mucinex and flutter C) max gerd rx since severe cough causes gerd which contributes to cough D) consider VEST option if not already tried   See instructions for specific recommendations which were reviewed directly with the patient who was given a copy with highlighter outlining the key components.

## 2015-03-01 NOTE — Progress Notes (Signed)
Quick Note:  Spoke with pt and notified of results per Dr. Wert. Pt verbalized understanding and denied any questions.  ______ 

## 2015-03-06 ENCOUNTER — Telehealth: Payer: Self-pay | Admitting: Critical Care Medicine

## 2015-03-06 NOTE — Telephone Encounter (Signed)
i doubt these are related to iV meds or lungs, however ok to work her in with NP. Or could see pcp

## 2015-03-06 NOTE — Telephone Encounter (Signed)
Called and spoke to pt. Pt started IV abx (Cefepime 1gm BID) on 4/21. Pt stated she is feeling much better since being on abx. However, she has had recent headaches (occipital area) followed by dizziness and nausea all lasting about 2-3 minutes and then resolving. Pt stated these s/s occur intermittently during the day and night and it started on 4/24. Pt requesting PW be aware of issues and requests  recs if necessary. Pt has f/u appt on 5/17 with PW. Pt stated she is expecting a call back today.   PW please advise if anything needs to be done.   Allergies  Allergen Reactions  . Amiodarone     Did not tolerate  . Crestor [Rosuvastatin] Other (See Comments)    Myalgia  . Darvon     Drop in  BP  . Prednisone Other (See Comments)    polyuria

## 2015-03-06 NOTE — Telephone Encounter (Signed)
Spoke with pt. She is aware of PW response. States that she does not want to come to [facility] for appointment. Will contact PCP to see if she can be seen.

## 2015-03-07 ENCOUNTER — Encounter: Payer: Self-pay | Admitting: Critical Care Medicine

## 2015-03-13 ENCOUNTER — Telehealth: Payer: Self-pay | Admitting: Critical Care Medicine

## 2015-03-13 DIAGNOSIS — A310 Pulmonary mycobacterial infection: Secondary | ICD-10-CM

## 2015-03-13 DIAGNOSIS — J479 Bronchiectasis, uncomplicated: Secondary | ICD-10-CM

## 2015-03-13 NOTE — Telephone Encounter (Signed)
i spoke to the pt and she verbalized understanding

## 2015-03-13 NOTE — Telephone Encounter (Signed)
Pt returning PW's call - [phone]

## 2015-03-13 NOTE — Telephone Encounter (Signed)
Called and spoke to pt. Informed pt of the results and recs per PW. Pt verbalized understanding. Pt still requesting a call back from PW to discuss. Best number to reach pt is [phone] at anytime.   PW please advise.

## 2015-03-13 NOTE — Telephone Encounter (Signed)
Let pt know we have additional pos culture results in her sputum culture I would like to discuss and poss ID referral  Tsukamurella., it was covered by the recent IV ABX but she need more oral ABX longterm.  When i called i LMTCB, no answer

## 2015-03-26 ENCOUNTER — Encounter: Payer: Self-pay | Admitting: Critical Care Medicine

## 2015-03-26 ENCOUNTER — Ambulatory Visit (INDEPENDENT_AMBULATORY_CARE_PROVIDER_SITE_OTHER): Payer: Medicare HMO | Admitting: Critical Care Medicine

## 2015-03-26 VITALS — BP 140/75 | HR 67 | Temp 96.4°F | Ht 66.0 in | Wt 150.0 lb

## 2015-03-26 DIAGNOSIS — J988 Other specified respiratory disorders: Secondary | ICD-10-CM | POA: Insufficient documentation

## 2015-03-26 DIAGNOSIS — J479 Bronchiectasis, uncomplicated: Secondary | ICD-10-CM

## 2015-03-26 DIAGNOSIS — B965 Pseudomonas (aeruginosa) (mallei) (pseudomallei) as the cause of diseases classified elsewhere: Secondary | ICD-10-CM | POA: Insufficient documentation

## 2015-03-26 DIAGNOSIS — Z86711 Personal history of pulmonary embolism: Secondary | ICD-10-CM

## 2015-03-26 DIAGNOSIS — A498 Other bacterial infections of unspecified site: Secondary | ICD-10-CM

## 2015-03-26 NOTE — Progress Notes (Signed)
Subjective:    Patient ID: [name] [name], female    DOB: August 26, 1924, 81 y.o.   MRN: [id number]  HPI 03/26/2015 Chief Complaint  Patient presents with  . Follow-up    Feeling better 80%-rec'd abx iv with [facility],occass. cough-clear,sob better,able to work in yard,denies wheezing and fcs.   S/p IV cefepime. Cough is now better  Pt had vertigo and headaches.  This went away without rx.  Also colonized, Tsukumurella.  AFB.  No standing water.   No home issues.   No f/c/s.  No wheezing .  No real dyspnea. Pt denies any significant sore throat, nasal congestion or excess secretions, fever, chills, sweats, unintended weight loss, pleurtic or exertional chest pain, orthopnea PND, or leg swelling Pt denies any increase in rescue therapy over baseline, denies waking up needing it or having any early am or nocturnal exacerbations of coughing/wheezing/or dyspnea. Pt also denies any obvious fluctuation in symptoms with  weather or environmental change or other alleviating or aggravating factors   Current Medications, Allergies, Complete Past Medical History, Past Surgical History, Family History, and Social History were reviewed in [employer] record.  Past Medical History  Diagnosis Date  . IBS (irritable bowel syndrome)   . Diverticulosis   . Colon polyps   . High cholesterol   . MI (myocardial infarction) ?? probably not 1975    a. 1975 Reported MI;  b. 2011 Cath: nonobs dzs;  c. 2013 Nonischemic Myoview;  d. 12/2012 Echo: EF 60-65%, Gr1 DD.  Marland Kitchen GERD (gastroesophageal reflux disease)   . Hypertension   . Nephrolithiasis   . Bronchiectasis, non-tuberculous     a. Followed by Dr. Delford Field.  Marland Kitchen DVT (deep venous thrombosis)     Resolved, no anticoagulation currently  . 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 Palpitation     a. 2010 Event Monitor: PACs and PVCs  . Pulmonary fibrosis   . First degree AV block     Conduction system  . Wide-complex  tachycardia     a. Followed by Dr. [name], "I think this is VT, but i am not sure QRSd 135 1:1 AV."  . Pulmonary embolism     Following knee surgery with a prior history  . COPD (chronic obstructive pulmonary disease)     past hx with pneumonia  . Stroke      Family History  Problem Relation Age of Onset  . Stomach cancer Brother   . Heart disease Father   . Heart attack Mother   . Other Son     [place]     History   Social History  . Marital Status: Married    Spouse Name: N/A  . Number of Children: 3  . Years of Education: N/A   Occupational History  . Retired     [employer]   Social History Main Topics  . Smoking status: Never Smoker   . Smokeless tobacco: Never Used  . Alcohol Use: No  . Drug Use: No  . Sexual Activity: Not on file   Other Topics Concern  . Not on file   Social History Narrative     Allergies  Allergen Reactions  . Amiodarone     Did not tolerate  . Crestor [Rosuvastatin] Other (See Comments)    Myalgia  . Darvon     Drop in  BP  . Prednisone Other (See Comments)    polyuria     Outpatient Prescriptions Prior to Visit  Medication  Sig Dispense Refill  . atorvastatin (LIPITOR) 20 MG tablet Take 20 mg by mouth Daily.     . beclomethasone (QVAR) 80 MCG/ACT inhaler Inhale 2 puffs into the lungs 2 (two) times daily. 1 Inhaler 12  . D3-50 50000 UNITS capsule Take by mouth once a week.   3  . hydrochlorothiazide (HYDRODIURIL) 25 MG tablet Take 0.5 tablets (12.5 mg total) by mouth daily. 30 tablet 3  . nebivolol (BYSTOLIC) 5 MG tablet Take one tablet by mouth daily 30 tablet 3  . omeprazole (PRILOSEC) 40 MG capsule Take 1 capsule by mouth as needed.    Marland Kitchen Respiratory Therapy Supplies (FLUTTER) DEVI Use 3-4 times per day (Patient not taking: Reported on 03/26/2015) 1 each 0  . Tiotropium Bromide Monohydrate (SPIRIVA RESPIMAT) 2.5 MCG/ACT AERS Two puff daily (Patient not taking: Reported on 03/26/2015) 4 g 11  . traMADol (ULTRAM)  50 MG tablet One every 4 hours as needed for severe cough (Patient not taking: Reported on 03/26/2015) 40 tablet 0  . ceFEPIme (MAXIPIME) 1 GM/50ML SOLN Inject 50 mLs (1 g total) into the vein every 12 (twelve) hours. (Patient not taking: Reported on 02/28/2015) 14 g 0   No facility-administered medications prior to visit.     Review of Systems  Constitutional: Negative for fever, chills, diaphoresis, activity change, appetite change, fatigue and unexpected weight change.  HENT: Negative for congestion, dental problem, ear discharge, ear pain, facial swelling, hearing loss, mouth sores, nosebleeds, postnasal drip, rhinorrhea, sinus pressure, sneezing, sore throat, tinnitus, trouble swallowing and voice change.   Eyes: Negative for photophobia, discharge, itching and visual disturbance.  Respiratory: Positive for cough. Negative for apnea, choking, chest tightness, shortness of breath, wheezing and stridor.   Cardiovascular: Negative for chest pain, palpitations and leg swelling.  Gastrointestinal: Negative for nausea, vomiting, abdominal pain, constipation, blood in stool and abdominal distention.  Genitourinary: Negative for dysuria, urgency, frequency, hematuria, flank pain, decreased urine volume and difficulty urinating.  Musculoskeletal: Negative for myalgias, back pain, joint swelling, arthralgias, gait problem, neck pain and neck stiffness.  Skin: Negative for color change, pallor and rash.  Neurological: Negative for dizziness, tremors, seizures, syncope, speech difficulty, weakness, light-headedness, numbness and headaches.  Hematological: Negative for adenopathy. Does not bruise/bleed easily.  Psychiatric/Behavioral: Negative for confusion, sleep disturbance and agitation. The patient is not nervous/anxious.        Objective:   Physical Exam  Constitutional: She appears well-developed and well-nourished. She is active.  Non-toxic appearance. She does not appear ill. No distress.    HENT:  Head: Normocephalic and atraumatic.  Nose: No mucosal edema, rhinorrhea, sinus tenderness, nasal deformity or septal deviation. No epistaxis. Right sinus exhibits no maxillary sinus tenderness and no frontal sinus tenderness. Left sinus exhibits no maxillary sinus tenderness and no frontal sinus tenderness.  Mouth/Throat: Oropharynx is clear and moist. No oropharyngeal exudate.  Eyes: Conjunctivae and EOM are normal. Pupils are equal, round, and reactive to light. Right eye exhibits no discharge. Left eye exhibits no discharge. No scleral icterus.  Neck: Normal range of motion. Neck supple. Normal carotid pulses, no hepatojugular reflux and no JVD present. No tracheal tenderness and no muscular tenderness present. Carotid bruit is not present. No rigidity. No tracheal deviation, no edema, no erythema and normal range of motion present. No thyroid mass and no thyromegaly present.  Cardiovascular: Normal rate, regular rhythm, S1 normal, S2 normal, normal heart sounds, intact distal pulses and normal pulses.  PMI is not displaced.  Exam reveals no gallop,  no S3, no S4, no distant heart sounds and no friction rub.   No murmur heard.  No systolic murmur is present   No diastolic murmur is present  Pulmonary/Chest: No accessory muscle usage or stridor. No apnea and no tachypnea. No respiratory distress. She has no decreased breath sounds. She has no wheezes. She has no rhonchi. She has no rales. Chest wall is not dull to percussion. She exhibits no mass, no tenderness, no bony tenderness and no deformity.  Abdominal: Soft. Normal appearance and bowel sounds are normal. She exhibits no distension, no ascites and no mass. There is no hepatosplenomegaly. There is no tenderness. There is no rigidity, no rebound, no guarding and no CVA tenderness.  Musculoskeletal: Normal range of motion.  Lymphadenopathy:       Head (right side): No submental and no submandibular adenopathy present.       Head (left  side): No submental and no submandibular adenopathy present.    She has no cervical adenopathy.    She has no axillary adenopathy.  Neurological: She is alert. She has normal strength and normal reflexes. No cranial nerve deficit or sensory deficit.  Skin: Skin is warm and dry. No bruising, no ecchymosis, no lesion and no rash noted. She is not diaphoretic. No cyanosis or erythema. No pallor. Nails show no clubbing.  Psychiatric: She has a normal mood and affect. Her speech is normal and behavior is normal.  Vitals reviewed.         Assessment & Plan:  I personally reviewed all images and lab data in the [facility] system as well as any outside material available during this office visit and agree with the  radiology impressions.  I also have reviewed any data /notes/records if available in care everywhere.  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 Pt has improved with this Rx significantly Plan Stay on qvar bid Flutter valve No further ABX  Watch tsukamurella, not needing Rx  Return 4 months   Pseudomonas respiratory infection Recent bronchiectasis flare with pseudomonas , now resolved     Evadean was seen today for follow-up.  Diagnoses and all orders for this visit:  Bronchiectasis without complication  Pseudomonas respiratory infection  Bronchiectasis, non-tuberculous  History of pulmonary embolism    I

## 2015-03-26 NOTE — Assessment & Plan Note (Signed)
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 Pt has improved with this Rx significantly Plan Stay on qvar bid Flutter valve No further ABX  Watch tsukamurella, not needing Rx  Return 4 months

## 2015-03-26 NOTE — Assessment & Plan Note (Signed)
Recent bronchiectasis flare with pseudomonas , now resolved

## 2015-03-26 NOTE — Patient Instructions (Signed)
No change in medications. Return in         4 months 

## 2015-04-15 ENCOUNTER — Encounter: Payer: Self-pay | Admitting: Critical Care Medicine

## 2015-04-22 ENCOUNTER — Encounter: Payer: Self-pay | Admitting: Critical Care Medicine

## 2015-04-30 ENCOUNTER — Encounter: Payer: Self-pay | Admitting: Critical Care Medicine

## 2015-04-30 ENCOUNTER — Ambulatory Visit (INDEPENDENT_AMBULATORY_CARE_PROVIDER_SITE_OTHER): Payer: Medicare HMO | Admitting: Critical Care Medicine

## 2015-04-30 VITALS — BP 132/62 | HR 75 | Temp 96.9°F | Ht 66.0 in | Wt 152.0 lb

## 2015-04-30 DIAGNOSIS — J479 Bronchiectasis, uncomplicated: Secondary | ICD-10-CM

## 2015-04-30 DIAGNOSIS — J470 Bronchiectasis with acute lower respiratory infection: Secondary | ICD-10-CM

## 2015-04-30 MED ORDER — CIPROFLOXACIN HCL 750 MG PO TABS: 750.0000 mg | ORAL_TABLET | Freq: Two times a day (BID) | ORAL | Status: DC

## 2015-04-30 NOTE — Assessment & Plan Note (Signed)
Bronchiectasis with Mycobacterium colonization previously documented in 2013. Now with recurrent airway infection secondary to Pseudomonas. Patient did receive 7 days of intravenous cefepime in March 2016. Plan Administer ciprofloxacin 750 mg twice daily for 7 days Continue pulmonary toilet and inhaled steroid-dependent Obtain chest x-ray Administer probiotic daily while on Cipro Return 3 weeks

## 2015-04-30 NOTE — Patient Instructions (Addendum)
Take cipro twice daily for 7days Take a pro biotic once daily while on cipro Obtain a Chest xray today No other changes Return 3 weeks

## 2015-04-30 NOTE — Progress Notes (Signed)
Subjective:    Patient ID: [name] [name], female    DOB: [birth year], 81 y.o.   MRN: [id number]  HPI 04/30/2015 Chief Complaint  Patient presents with  . Acute Visit    c/o coughing sm. amt of white for 4 days,hurting between shoulder blades "stinging",sob worse,fever 2 days ago,denies cp or tightness,no wheezing.No energy.  Notes more cough x 4days. Pain in the back between the shoulder blades.  No energy, notes some fever.  Mucus is white, no blood or discolored.   No real wheeze,  Cough in paroxysms.  Pain is needle like pain.   [name] [name] denies any significant sore throat, nasal congestion  unintended weight loss,  orthopnea PND, or leg swelling [name] [name] denies any increase in rescue therapy over baseline, denies waking up needing it or having any early am or nocturnal exacerbations of coughing/wheezing/or dyspnea. [name] [name] also denies any obvious fluctuation in symptoms with  weather or environmental change or other alleviating or aggravating factors   Current Medications, Allergies, Complete Past Medical History, Past Surgical History, Family History, and Social History were reviewed in [employer] electronic medical record per todays encounter:  04/30/2015  Review of Systems  Constitutional: Positive for fever and fatigue.  HENT: Negative.  Negative for ear pain, postnasal drip, rhinorrhea, sinus pressure, sore throat, trouble swallowing and voice change.   Eyes: Negative.   Respiratory: Positive for cough, chest tightness, shortness of breath and wheezing. Negative for apnea, choking and stridor.   Cardiovascular: Positive for chest pain. Negative for palpitations and leg swelling.  Gastrointestinal: Negative.  Negative for nausea, vomiting, abdominal pain and abdominal distention.  Genitourinary: Negative.   Musculoskeletal: Negative.  Negative for myalgias and arthralgias.  Skin: Negative.  Negative for rash.  Allergic/Immunologic: Negative.  Negative for environmental allergies and food  allergies.  Neurological: Negative.  Negative for dizziness, syncope, weakness and headaches.  Hematological: Negative.  Negative for adenopathy. Does not bruise/bleed easily.  Psychiatric/Behavioral: Negative.  Negative for sleep disturbance and agitation. The patient is not nervous/anxious.        Objective:   Physical Exam Filed Vitals:   04/30/15 1420  BP: 132/62  Pulse: 75  Temp: 96.9 F (36.1 C)  TempSrc: Oral  Height:  (1.676 m)  Weight: 152 lb (68.947 kg)  SpO2: 93%    Gen: Pleasant, well-nourished, in no distress,  normal affect  ENT: No lesions,  mouth clear,  oropharynx clear, no postnasal drip  Neck: No JVD, no TMG, no carotid bruits  Lungs: No use of accessory muscles, no dullness to percussion, bilateral rhonchi and expired wheezes midlung zones  Cardiovascular: RRR, heart sounds normal, no murmur or gallops, no peripheral edema  Abdomen: soft and NT, no HSM,  BS normal  Musculoskeletal: No deformities, no cyanosis or clubbing  Neuro: alert, non focal  Skin: Warm, no lesions or rashes  No results found.        Assessment & Plan:  I personally reviewed all images and lab data in the [facility] system as well as any outside material available during this office visit and agree with the  radiology impressions.   Bronchiectasis, non-tuberculous Bronchiectasis with Mycobacterium colonization previously documented in 2013. Now with recurrent airway infection secondary to Pseudomonas. Patient did receive 7 days of intravenous cefepime in March 2016. Plan Administer ciprofloxacin 750 mg twice daily for 7 days Continue pulmonary toilet and inhaled steroid-dependent Obtain chest x-ray Administer probiotic daily while on Cipro Return 3 weeks   [name] [name] was seen today  for acute visit.  Diagnoses and all orders for this visit:  Bronchiectasis with acute lower respiratory infection Orders: -     DG Chest 2 View; Future -     DG Chest 2  View  Bronchiectasis, non-tuberculous  Other orders -     ciprofloxacin (CIPRO) 750 MG tablet; Take 1 tablet (750 mg total) by mouth 2 (two) times daily.    I had an extended discussion with the patient and or family lasting 10 minutes of a 25 minute visit including:  Disease state treatment options and need for oral Tobi Bastos biotics and chest x-ray

## 2015-05-03 ENCOUNTER — Telehealth: Payer: Self-pay | Admitting: Critical Care Medicine

## 2015-05-03 NOTE — Telephone Encounter (Signed)
Spoke with pt.  Discussed results and recs per Dr. Delford Field.  She verbalized understanding, is in agreement with plan, and voiced no further questions or concerns at this time.

## 2015-05-03 NOTE — Telephone Encounter (Signed)
Let pt know CXR neg for PNA Stay on meds as Rx

## 2015-05-03 NOTE — Telephone Encounter (Signed)
Attempted to contact patient, phone rang, no answer.  Will call back.

## 2015-05-06 NOTE — Telephone Encounter (Signed)
Patient was calling because she thought she missed her appointment with Dr. Delford Field this morning because she had to take her husband to the hospital.  Advised patient that she did not miss her appointment and gave her date and time of appointment.  Nothing further needed.

## 2015-05-06 NOTE — Telephone Encounter (Signed)
Pt returning call.[name] [name] ° °

## 2015-05-06 NOTE — Telephone Encounter (Signed)
LMTCB x 1 

## 2015-05-14 ENCOUNTER — Other Ambulatory Visit (HOSPITAL_COMMUNITY): Payer: Medicare HMO

## 2015-05-14 ENCOUNTER — Emergency Department (HOSPITAL_COMMUNITY): Payer: Medicare HMO

## 2015-05-14 ENCOUNTER — Inpatient Hospital Stay (HOSPITAL_COMMUNITY): Payer: Medicare HMO

## 2015-05-14 ENCOUNTER — Encounter (HOSPITAL_COMMUNITY)
Admission: EM | Disposition: A | Discharge status: Home / Self Care | Payer: Self-pay | Source: Home / Self Care | Attending: Cardiology

## 2015-05-14 ENCOUNTER — Encounter (HOSPITAL_COMMUNITY): Payer: Self-pay | Admitting: Emergency Medicine

## 2015-05-14 ENCOUNTER — Inpatient Hospital Stay (HOSPITAL_COMMUNITY)
Admission: EM | Admit: 2015-05-14 | Discharge: 2015-05-15 | DRG: 243 | Disposition: A | Discharge status: Home / Self Care | Payer: Medicare HMO | Attending: Cardiology | Admitting: Cardiology

## 2015-05-14 DIAGNOSIS — I252 Old myocardial infarction: Secondary | ICD-10-CM | POA: Diagnosis not present

## 2015-05-14 DIAGNOSIS — Z888 Allergy status to other drugs, medicaments and biological substances status: Secondary | ICD-10-CM

## 2015-05-14 DIAGNOSIS — I429 Cardiomyopathy, unspecified: Secondary | ICD-10-CM | POA: Diagnosis present

## 2015-05-14 DIAGNOSIS — E780 Pure hypercholesterolemia: Secondary | ICD-10-CM | POA: Diagnosis present

## 2015-05-14 DIAGNOSIS — Z79899 Other long term (current) drug therapy: Secondary | ICD-10-CM | POA: Diagnosis not present

## 2015-05-14 DIAGNOSIS — R079 Chest pain, unspecified: Secondary | ICD-10-CM

## 2015-05-14 DIAGNOSIS — R001 Bradycardia, unspecified: Secondary | ICD-10-CM | POA: Diagnosis not present

## 2015-05-14 DIAGNOSIS — Z8673 Personal history of transient ischemic attack (TIA), and cerebral infarction without residual deficits: Secondary | ICD-10-CM

## 2015-05-14 DIAGNOSIS — J8410 Pulmonary fibrosis, unspecified: Secondary | ICD-10-CM | POA: Diagnosis present

## 2015-05-14 DIAGNOSIS — Z8249 Family history of ischemic heart disease and other diseases of the circulatory system: Secondary | ICD-10-CM

## 2015-05-14 DIAGNOSIS — Z885 Allergy status to narcotic agent status: Secondary | ICD-10-CM

## 2015-05-14 DIAGNOSIS — R55 Syncope and collapse: Secondary | ICD-10-CM | POA: Diagnosis not present

## 2015-05-14 DIAGNOSIS — Z86718 Personal history of other venous thrombosis and embolism: Secondary | ICD-10-CM | POA: Diagnosis not present

## 2015-05-14 DIAGNOSIS — I441 Atrioventricular block, second degree: Secondary | ICD-10-CM

## 2015-05-14 DIAGNOSIS — K219 Gastro-esophageal reflux disease without esophagitis: Secondary | ICD-10-CM | POA: Diagnosis present

## 2015-05-14 DIAGNOSIS — J449 Chronic obstructive pulmonary disease, unspecified: Secondary | ICD-10-CM | POA: Diagnosis present

## 2015-05-14 DIAGNOSIS — Z959 Presence of cardiac and vascular implant and graft, unspecified: Secondary | ICD-10-CM

## 2015-05-14 DIAGNOSIS — Z96651 Presence of right artificial knee joint: Secondary | ICD-10-CM | POA: Diagnosis present

## 2015-05-14 DIAGNOSIS — I10 Essential (primary) hypertension: Secondary | ICD-10-CM | POA: Diagnosis present

## 2015-05-14 DIAGNOSIS — I4510 Unspecified right bundle-branch block: Secondary | ICD-10-CM | POA: Diagnosis present

## 2015-05-14 DIAGNOSIS — I442 Atrioventricular block, complete: Secondary | ICD-10-CM | POA: Diagnosis present

## 2015-05-14 DIAGNOSIS — Z86711 Personal history of pulmonary embolism: Secondary | ICD-10-CM | POA: Diagnosis not present

## 2015-05-14 DIAGNOSIS — I459 Conduction disorder, unspecified: Secondary | ICD-10-CM

## 2015-05-14 HISTORY — PX: CARDIAC CATHETERIZATION: SHX172

## 2015-05-14 HISTORY — PX: EP IMPLANTABLE DEVICE: SHX172B

## 2015-05-14 HISTORY — DX: Bradycardia, unspecified: R00.1

## 2015-05-14 LAB — CBC
HCT: 40.3 % (ref 36.0–46.0)
HEMOGLOBIN: 13.4 g/dL (ref 12.0–15.0)
MCH: 30.3 pg (ref 26.0–34.0)
MCHC: 33.3 g/dL (ref 30.0–36.0)
MCV: 91.2 fL (ref 78.0–100.0)
Platelets: 178 10*3/uL (ref 150–400)
RBC: 4.42 MIL/uL (ref 3.87–5.11)
RDW: 13.4 % (ref 11.5–15.5)
WBC: 6.6 10*3/uL (ref 4.0–10.5)

## 2015-05-14 LAB — TSH: TSH: 1.778 u[IU]/mL (ref 0.350–4.500)

## 2015-05-14 LAB — CBC WITH DIFFERENTIAL/PLATELET
BASOS ABS: 0.1 10*3/uL (ref 0.0–0.1)
Basophils Relative: 1 % (ref 0–1)
EOS ABS: 0.1 10*3/uL (ref 0.0–0.7)
Eosinophils Relative: 2 % (ref 0–5)
HCT: 41.3 % (ref 36.0–46.0)
HEMOGLOBIN: 13.6 g/dL (ref 12.0–15.0)
Lymphocytes Relative: 26 % (ref 12–46)
Lymphs Abs: 1.6 10*3/uL (ref 0.7–4.0)
MCH: 30.4 pg (ref 26.0–34.0)
MCHC: 32.9 g/dL (ref 30.0–36.0)
MCV: 92.4 fL (ref 78.0–100.0)
Monocytes Absolute: 0.4 10*3/uL (ref 0.1–1.0)
Monocytes Relative: 6 % (ref 3–12)
NEUTROS ABS: 3.9 10*3/uL (ref 1.7–7.7)
Neutrophils Relative %: 65 % (ref 43–77)
Platelets: 181 10*3/uL (ref 150–400)
RBC: 4.47 MIL/uL (ref 3.87–5.11)
RDW: 13.6 % (ref 11.5–15.5)
WBC: 6 10*3/uL (ref 4.0–10.5)

## 2015-05-14 LAB — MRSA PCR SCREENING: MRSA by PCR: NEGATIVE

## 2015-05-14 LAB — CREATININE, SERUM
CREATININE: 0.99 mg/dL (ref 0.44–1.00)
GFR calc non Af Amer: 49 mL/min — ABNORMAL LOW (ref >60)
GFR, EST AFRICAN AMERICAN: 57 mL/min — AB (ref >60)

## 2015-05-14 LAB — COMPREHENSIVE METABOLIC PANEL
ALK PHOS: 46 U/L (ref 38–126)
ALT: 13 U/L — ABNORMAL LOW (ref 14–54)
AST: 23 U/L (ref 15–41)
Albumin: 3.6 g/dL (ref 3.5–5.0)
Anion gap: 8 (ref 5–15)
BUN: 16 mg/dL (ref 6–20)
CO2: 22 mmol/L (ref 22–32)
Calcium: 8.9 mg/dL (ref 8.9–10.3)
Chloride: 108 mmol/L (ref 101–111)
Creatinine, Ser: 1 mg/dL (ref 0.44–1.00)
GFR calc non Af Amer: 48 mL/min — ABNORMAL LOW (ref >60)
GFR, EST AFRICAN AMERICAN: 56 mL/min — AB (ref >60)
GLUCOSE: 104 mg/dL — AB (ref 65–99)
POTASSIUM: 4.4 mmol/L (ref 3.5–5.1)
Sodium: 138 mmol/L (ref 135–145)
Total Bilirubin: 0.6 mg/dL (ref 0.3–1.2)
Total Protein: 6.5 g/dL (ref 6.5–8.1)

## 2015-05-14 LAB — I-STAT CHEM 8, ED
BUN: 18 mg/dL (ref 6–20)
CALCIUM ION: 1.22 mmol/L (ref 1.13–1.30)
Chloride: 107 mmol/L (ref 101–111)
Creatinine, Ser: 1 mg/dL (ref 0.44–1.00)
GLUCOSE: 96 mg/dL (ref 65–99)
HEMATOCRIT: 41 % (ref 36.0–46.0)
Hemoglobin: 13.9 g/dL (ref 12.0–15.0)
Potassium: 4.4 mmol/L (ref 3.5–5.1)
SODIUM: 141 mmol/L (ref 135–145)
TCO2: 22 mmol/L (ref 0–100)

## 2015-05-14 LAB — I-STAT TROPONIN, ED: Troponin i, poc: 0 ng/mL (ref 0.00–0.08)

## 2015-05-14 LAB — MAGNESIUM: MAGNESIUM: 1.9 mg/dL (ref 1.7–2.4)

## 2015-05-14 LAB — TROPONIN I: Troponin I: <0.03 ng/mL (ref <0.031)

## 2015-05-14 SURGERY — PACEMAKER IMPLANT
Anesthesia: LOCAL | Laterality: Right

## 2015-05-14 MED ORDER — SODIUM CHLORIDE 0.9 % IV SOLN: INTRAVENOUS | Status: DC

## 2015-05-14 MED ORDER — HYDROCODONE-ACETAMINOPHEN 5-325 MG PO TABS: 1.0000 | ORAL_TABLET | ORAL | Status: DC | PRN

## 2015-05-14 MED ORDER — GENTAMICIN SULFATE 40 MG/ML IJ SOLN
80.0000 mg | INTRAMUSCULAR | Status: DC
  Filled 2015-05-14: qty 2

## 2015-05-14 MED ORDER — SODIUM CHLORIDE 0.9 % IJ SOLN: 3.0000 mL | Freq: Two times a day (BID) | INTRAMUSCULAR | Status: DC

## 2015-05-14 MED ORDER — TIOTROPIUM BROMIDE MONOHYDRATE 2.5 MCG/ACT IN AERS: 2.0000 | INHALATION_SPRAY | Freq: Every day | RESPIRATORY_TRACT | Status: DC

## 2015-05-14 MED ORDER — CEFAZOLIN SODIUM 1-5 GM-% IV SOLN
1.0000 g | Freq: Four times a day (QID) | INTRAVENOUS | Status: AC
  Administered 2015-05-14 – 2015-05-15 (×3): 1 g via INTRAVENOUS
  Filled 2015-05-14 (×3): qty 50

## 2015-05-14 MED ORDER — ATORVASTATIN CALCIUM 20 MG PO TABS
20.0000 mg | ORAL_TABLET | Freq: Every day | ORAL | Status: DC
  Filled 2015-05-14: qty 1

## 2015-05-14 MED ORDER — SODIUM CHLORIDE 0.9 % IR SOLN
Status: DC | PRN
  Administered 2015-05-14: 500 mL

## 2015-05-14 MED ORDER — YOU HAVE A PACEMAKER BOOK
Freq: Once | Status: AC
  Administered 2015-05-14: 21:00:00
  Filled 2015-05-14: qty 1

## 2015-05-14 MED ORDER — LIDOCAINE HCL (PF) 1 % IJ SOLN
INTRAMUSCULAR | Status: DC | PRN
  Administered 2015-05-14: 35 mL

## 2015-05-14 MED ORDER — BECLOMETHASONE DIPROPIONATE 80 MCG/ACT IN AERS: 2.0000 | INHALATION_SPRAY | Freq: Two times a day (BID) | RESPIRATORY_TRACT | Status: DC

## 2015-05-14 MED ORDER — ONDANSETRON HCL 4 MG/2ML IJ SOLN: 4.0000 mg | Freq: Four times a day (QID) | INTRAMUSCULAR | Status: DC | PRN

## 2015-05-14 MED ORDER — CHLORHEXIDINE GLUCONATE 4 % EX LIQD
CUTANEOUS | Status: AC
  Filled 2015-05-14: qty 15

## 2015-05-14 MED ORDER — CEFAZOLIN SODIUM-DEXTROSE 2-3 GM-% IV SOLR
INTRAVENOUS | Status: DC | PRN
  Administered 2015-05-14: 2 g via INTRAVENOUS

## 2015-05-14 MED ORDER — GENTAMICIN SULFATE 40 MG/ML IJ SOLN
INTRAMUSCULAR | Status: AC
  Filled 2015-05-14: qty 2

## 2015-05-14 MED ORDER — CHLORHEXIDINE GLUCONATE 4 % EX LIQD
60.0000 mL | Freq: Once | CUTANEOUS | Status: AC
  Administered 2015-05-14: 4 via TOPICAL

## 2015-05-14 MED ORDER — NEBIVOLOL HCL 5 MG PO TABS
5.0000 mg | ORAL_TABLET | Freq: Every day | ORAL | Status: DC
  Administered 2015-05-15: 5 mg via ORAL
  Filled 2015-05-14: qty 1

## 2015-05-14 MED ORDER — CEFAZOLIN SODIUM-DEXTROSE 2-3 GM-% IV SOLR
INTRAVENOUS | Status: AC
Start: 2015-05-14 — End: 2015-05-14
  Filled 2015-05-14: qty 50

## 2015-05-14 MED ORDER — BUPIVACAINE HCL (PF) 0.25 % IJ SOLN
INTRAMUSCULAR | Status: DC | PRN
  Administered 2015-05-14: 10 mL

## 2015-05-14 MED ORDER — BUDESONIDE 0.25 MG/2ML IN SUSP
0.2500 mg | Freq: Two times a day (BID) | RESPIRATORY_TRACT | Status: DC
Start: 2015-05-14 — End: 2015-05-15
  Administered 2015-05-14 – 2015-05-15 (×3): 0.25 mg via RESPIRATORY_TRACT
  Filled 2015-05-14 (×6): qty 2

## 2015-05-14 MED ORDER — SODIUM CHLORIDE 0.9 % IJ SOLN: 3.0000 mL | INTRAMUSCULAR | Status: DC | PRN

## 2015-05-14 MED ORDER — SODIUM CHLORIDE 0.9 % IV SOLN: 250.0000 mL | INTRAVENOUS | Status: DC | PRN

## 2015-05-14 MED ORDER — CHLORHEXIDINE GLUCONATE 4 % EX LIQD: 60.0000 mL | Freq: Once | CUTANEOUS | Status: AC

## 2015-05-14 MED ORDER — TIOTROPIUM BROMIDE MONOHYDRATE 18 MCG IN CAPS
18.0000 ug | ORAL_CAPSULE | Freq: Every day | RESPIRATORY_TRACT | Status: DC
  Administered 2015-05-14 – 2015-05-15 (×2): 18 ug via RESPIRATORY_TRACT
  Filled 2015-05-14: qty 5

## 2015-05-14 MED ORDER — ACETAMINOPHEN 325 MG PO TABS: 650.0000 mg | ORAL_TABLET | ORAL | Status: DC | PRN

## 2015-05-14 MED ORDER — BUPIVACAINE HCL (PF) 0.25 % IJ SOLN
INTRAMUSCULAR | Status: AC
  Filled 2015-05-14: qty 30

## 2015-05-14 MED ORDER — HEPARIN SODIUM (PORCINE) 5000 UNIT/ML IJ SOLN
5000.0000 [IU] | Freq: Three times a day (TID) | INTRAMUSCULAR | Status: DC
  Administered 2015-05-14: 5000 [IU] via SUBCUTANEOUS
  Filled 2015-05-14: qty 1

## 2015-05-14 MED ORDER — CEFAZOLIN SODIUM-DEXTROSE 2-3 GM-% IV SOLR
2.0000 g | INTRAVENOUS | Status: DC
  Filled 2015-05-14: qty 50

## 2015-05-14 MED ORDER — HYDROCHLOROTHIAZIDE 25 MG PO TABS: 12.5000 mg | ORAL_TABLET | Freq: Every day | ORAL | Status: DC

## 2015-05-14 MED ORDER — IOHEXOL 350 MG/ML SOLN
INTRAVENOUS | Status: DC | PRN
  Administered 2015-05-14: 15 mL via INTRAVENOUS

## 2015-05-14 MED ORDER — LIDOCAINE HCL (PF) 1 % IJ SOLN
INTRAMUSCULAR | Status: AC
  Filled 2015-05-14: qty 60

## 2015-05-14 MED ORDER — ACETAMINOPHEN 325 MG PO TABS: 325.0000 mg | ORAL_TABLET | ORAL | Status: DC | PRN

## 2015-05-14 MED ORDER — PANTOPRAZOLE SODIUM 40 MG PO TBEC
40.0000 mg | DELAYED_RELEASE_TABLET | Freq: Every day | ORAL | Status: DC
  Administered 2015-05-14 – 2015-05-15 (×2): 40 mg via ORAL
  Filled 2015-05-14 (×2): qty 1

## 2015-05-14 SURGICAL SUPPLY — 15 items
CABLE SURGICAL S-101-97-12 (CABLE) ×3 IMPLANT
CATH JOSEPHSON QUAD-ALLRED 6FR (CATHETERS) ×3 IMPLANT
LEAD CAPSURE NOVUS 45CM (Lead) ×3 IMPLANT
LEAD CAPSURE NOVUS 5076-58CM (Lead) ×3 IMPLANT
PACEMAKER ADAPTA DR ADDRL1 (Pacemaker) ×2 IMPLANT
PACK EP LATEX FREE (CUSTOM PROCEDURE TRAY) ×1
PACK EP LF (CUSTOM PROCEDURE TRAY) ×2 IMPLANT
PAD DEFIB LIFELINK (PAD) ×3 IMPLANT
PPM ADAPTA DR ADDRL1 (Pacemaker) ×3 IMPLANT
SHEATH CLASSIC 7F (SHEATH) ×6 IMPLANT
SHEATH CLASSIC 8F (SHEATH) IMPLANT
SHEATH CLASSIC 9.5F (SHEATH) IMPLANT
SHEATH CLASSIC 9F (SHEATH) IMPLANT
SHEATH PINNACLE 6F 10CM (SHEATH) ×6 IMPLANT
TRAY PACEMAKER INSERTION (CUSTOM PROCEDURE TRAY) ×3 IMPLANT

## 2015-05-14 NOTE — ED Notes (Signed)
Pt to CT

## 2015-05-14 NOTE — ED Notes (Signed)
Xray called for portable stat.

## 2015-05-14 NOTE — Discharge Summary (Signed)
ELECTROPHYSIOLOGY PROCEDURE DISCHARGE SUMMARY    Patient ID: [name] [name],  MRN: [id number], DOB/AGE: Feb 10, 1924 81 y.o.  Admit date: 05/14/2015 Discharge date: 05/15/2015  Primary Care Physician: [name], MD Electrophysiologist: [name]  Primary Discharge Diagnosis:  Symptomatic complete heart block status post pacemaker implantation this admission  Secondary Discharge Diagnosis:  1.  NICM 2.  Hypertension 3.  RVOT PVC's 4.  Pulmonary fibrosis 5.  COPD  Allergies  Allergen Reactions  . Amiodarone     Did not tolerate  . Crestor [Rosuvastatin] Other (See Comments)    Myalgia  . Darvon     Drop in  BP  . Prednisone Other (See Comments)    polyuria     Procedures This Admission:  1.  Echocardiogram on 05/14/15 demonstrated EF 60-65%, mild LVH, no RWMA 2.  Implantation of a MDT dual chamber PPM on 05/14/15 by Dr Johney Frame.  The patient received a  model number [id number] PPM with model number 5076 right atrial lead and 5076 right ventricular lead. There were no immediate post procedure complications. 2.  CXR on 05/15/15 demonstrated no pneumothorax status post device implantation.   Brief HPI/Hospital Course:  [name] [name] is a 81 y.o. female with a past medical history as outlined above. She presented to the ER following a fall at home and increased weakness. She was found to be in complete heart block.  Her Bystolic had been held for >3 half lives with persistent heart block. Risks, benefits, and alternatives to PPM implantation were reviewed with the patient who wished to proceed.  The patient underwent implantation of a MDT dual chamber pacemaker with details as outlined above.  She was monitored on telemetry overnight which demonstrated AV pacing.  Left chest was without hematoma or ecchymosis.  The device was interrogated and found to be functioning normally.  CXR was obtained and demonstrated no pneumothorax status post device implantation.  Wound care, arm mobility, and  restrictions were reviewed with the patient.  The patient was examined and considered stable for discharge to home.    Physical Exam: Filed Vitals:   05/14/15 2100 05/14/15 2200 05/15/15 0007 05/15/15 0405  BP: 156/65 141/60 155/59 154/60  Pulse:   60 60  Temp:   98.1 F (36.7 C) 98 F (36.7 C)  TempSrc:   Oral Oral  Resp: 17 15 17 14   Height:      Weight:    148 lb 9.4 oz (67.4 kg)  SpO2: 94% 95% 93% 94%    GEN- The patient is well appearing, alert and oriented x 3 today.   HEENT: normocephalic, atraumatic; sclera clear, conjunctiva pink; hearing intact; oropharynx clear; neck supple  Lungs- Clear to ausculation bilaterally, normal work of breathing.  No wheezes, rales, rhonchi Heart- Regular rate and rhythm, no murmurs, rubs or gallops  GI- soft, non-tender, non-distended, bowel sounds present  Extremities- no clubbing, cyanosis, or edema  MS- no significant deformity or atrophy Skin- warm and dry, no rash or lesion, left chest without hematoma/ecchymosis Psych- euthymic mood, full affect Neuro- strength and sensation are intact   Labs:   Lab Results  Component Value Date   WBC 6.6 05/14/2015   HGB 13.4 05/14/2015   HCT 40.3 05/14/2015   MCV 91.2 05/14/2015   PLT 178 05/14/2015     Recent Labs Lab 05/14/15 0950  05/15/15 0253  NA 138  < > 140  K 4.4  < > 4.0  CL 108  < > 108  CO2  22  --  26  BUN 16  < > 15  CREATININE 1.00  < > 1.05*  CALCIUM 8.9  --  9.0  PROT 6.5  --   --   BILITOT 0.6  --   --   ALKPHOS 46  --   --   ALT 13*  --   --   AST 23  --   --   GLUCOSE 104*  < > 103*  < > = values in this interval not displayed.  Discharge Medications:    Medication List    TAKE these medications        atorvastatin 20 MG tablet  Commonly known as:  LIPITOR  Take 20 mg by mouth Daily.     beclomethasone 80 MCG/ACT inhaler  Commonly known as:  QVAR  Inhale 2 puffs into the lungs 2 (two) times daily.     ciprofloxacin 750 MG tablet  Commonly known  as:  CIPRO  Take 1 tablet (750 mg total) by mouth 2 (two) times daily.     D3-50 50000 UNITS capsule  Generic drug:  Cholecalciferol  Take by mouth once a week.     FLUTTER Devi  Use 3-4 times per day     hydrochlorothiazide 25 MG tablet  Commonly known as:  HYDRODIURIL  Take 0.5 tablets (12.5 mg total) by mouth daily.     nebivolol 5 MG tablet  Commonly known as:  BYSTOLIC  Take one tablet by mouth daily     omeprazole 40 MG capsule  Commonly known as:  PRILOSEC  Take 1 capsule by mouth daily as needed (heartburn).     Tiotropium Bromide Monohydrate 2.5 MCG/ACT Aers  Commonly known as:  SPIRIVA RESPIMAT  Two puff daily     traMADol 50 MG tablet  Commonly known as:  ULTRAM  One every 4 hours as needed for severe cough        Disposition:  Discharge Instructions    Diet - low sodium heart healthy    Complete by:  As directed      Increase activity slowly    Complete by:  As directed           Follow-up Information    Follow up with [facility] On 05/29/2015.   Why:  at 4PM for wound check   Contact information:   [address] [place] [address]       Duration of Discharge Encounter: Greater than 30 minutes including physician time.  Signed, Gypsy Balsam, NP 05/15/2015 6:55 AM  I have seen, examined the patient, and reviewed the above assessment and plan. Device interrogation is reviewed and normal. Changes to above are made where necessary.  CXR reveals stable leads.  DC to home today.  Follow-up with PCP for BP management.  Routine wound care and device follow-up  Co Sign: Hillis Range, MD 05/15/2015 8:28 AM

## 2015-05-14 NOTE — Discharge Instructions (Signed)
° ° °  Supplemental Discharge Instructions for  Pacemaker/Defibrillator Patients  Activity No heavy lifting or vigorous activity with your left/right arm for 6 to 8 weeks.  Do not raise your left/right arm above your head for one week.  Gradually raise your affected arm as drawn below.           __      05/18/15                       05/19/15                    05/20/15                    05/21/15  NO DRIVING for   1 week  ; you may begin driving on  9/32/35   .  WOUND CARE - Keep the wound area clean and dry.  Do not get this area wet for one week. No showers for one week; you may shower on 05/21/15    . - The tape/steri-strips on your wound will fall off; do not pull them off.  No bandage is needed on the site.  DO  NOT apply any creams, oils, or ointments to the wound area. - If you notice any drainage or discharge from the wound, any swelling or bruising at the site, or you develop a fever > 101? F after you are discharged home, call the office at once.  Special Instructions - You are still able to use cellular telephones; use the ear opposite the side where you have your pacemaker/defibrillator.  Avoid carrying your cellular phone near your device. - When traveling through airports, show security personnel your identification card to avoid being screened in the metal detectors.  Ask the security personnel to use the hand wand. - Avoid arc welding equipment, MRI testing (magnetic resonance imaging), TENS units (transcutaneous nerve stimulators).  Call the office for questions about other devices. - Avoid electrical appliances that are in poor condition or are not properly grounded. - Microwave ovens are safe to be near or to operate.

## 2015-05-14 NOTE — Progress Notes (Signed)
Called Lanora Manis RN for report

## 2015-05-14 NOTE — ED Notes (Signed)
Cardiologist MD at bedside

## 2015-05-14 NOTE — ED Notes (Signed)
Portable at bedside 

## 2015-05-14 NOTE — ED Notes (Signed)
Pt reports she feels like she is going to pass out and feels like hard to breathe. Oxygen placed.

## 2015-05-14 NOTE — Consult Note (Addendum)
ELECTROPHYSIOLOGY CONSULT NOTE    Patient ID: [name] [name] MRN: [id number], DOB/AGE: Nov 03, 1924 81 y.o.  Admit date: 05/14/2015 Date of Consult: 05/14/2015  Primary Physician: [name], MD Primary Cardiologist: [name]  Reason for Consultation: bradycardia and heart block  HPI:  [name] [name] is a 81 y.o. female with a past medical history significant for CAD, hypertension, pulmonary fibrosis, COPD, prior PE, and RVOT VT.  She has been maintained on Bystolic for several years for control of palpitations which has worked well.  This morning, while at home, she had an episode of near syncope that has been preceded by a several week history of weakness and exercise intolerance.  She presented to the ER for further evaluation and was found to be in high grade AV block with ventricular rates in the 40's.  EP has been asked to evaluate for treatment options.  Last echo 11/2013 demonstrated normal EF, no RWMA, grade 1 diastolic dysfunction, mild MR.  Repeat echo pending this admission.    Lab work is unremarkable.  She is complaining of being tired and her head feeling heavy. CT of the head demonstrated old left occipital infarct with no acute abnormalities.    Past Medical History  Diagnosis Date  . IBS (irritable bowel syndrome)   . Diverticulosis   . Colon polyps   . High cholesterol   . MI (myocardial infarction) ?? probably not 1975    a. 1975 Reported MI;  b. 2011 Cath: nonobs dzs;  c. 2013 Nonischemic Myoview;  d. 12/2012 Echo: EF 60-65%, Gr1 DD.  Marland Kitchen GERD (gastroesophageal reflux disease)   . Hypertension   . Nephrolithiasis   . Bronchiectasis, non-tuberculous     a. Followed by Dr. Delford Field.  Marland Kitchen DVT (deep venous thrombosis)     Resolved, no anticoagulation currently  . 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 Palpitation     a. 2010 Event Monitor: PACs and PVCs  . Pulmonary fibrosis   . First degree AV block     Conduction system  . Wide-complex tachycardia       a. Followed by Dr. [name], "I think this is VT, but i am not sure QRSd 135 1:1 AV."  . Pulmonary embolism     Following knee surgery with a prior history  . COPD (chronic obstructive pulmonary disease)     past hx with pneumonia  . Stroke      Surgical History:  Past Surgical History  Procedure Laterality Date  . Total abdominal hysterectomy    . Appendectomy    . Bladder tack    . Cholecystectomy    . Colon surgery      12inches colectomy s/p diverticulitis  . Tonsillectomy    . Total knee arthroplasty  12/12/2012    Procedure: TOTAL KNEE ARTHROPLASTY;  Surgeon: Harvie Junior, MD;  Location: [facility] OR;  Service: Orthopedics;  Laterality: Right;  right total knee arthroplasty     Allergies:  Allergies  Allergen Reactions  . Amiodarone     Did not tolerate  . Crestor [Rosuvastatin] Other (See Comments)    Myalgia  . Darvon     Drop in  BP  . Prednisone Other (See Comments)    polyuria    History   Social History  . Marital Status: Married    Spouse Name: N/A  . Number of Children: 3  . Years of Education: N/A   Occupational History  . Retired     [employer]  History Main Topics  . Smoking status: Never Smoker   . Smokeless tobacco: Never Used  . Alcohol Use: No  . Drug Use: No  . Sexual Activity: Not on file   Other Topics Concern  . Not on file   Social History Narrative     Family History  Problem Relation Age of Onset  . Stomach cancer Brother   . Heart disease Father   . Heart attack Mother   . Other Son     Triple Bypass     Review of Systems: All other systems reviewed and are otherwise negative except as noted above.  Physical Exam: Filed Vitals:   05/14/15 1005 05/14/15 1010 05/14/15 1011 05/14/15 1015  BP:    176/58  Pulse: 85 40 43 41  Temp:      TempSrc:      Resp: SpO2: 100% 100% 100% 100%    GEN- The patient is elderly appearing, alert and oriented x 3 today.   HEENT: normocephalic, atraumatic;  sclera clear, conjunctiva pink; hearing intact; oropharynx clear; neck supple  Lungs- Clear to ausculation bilaterally, normal work of breathing.  No wheezes, rales, rhonchi Heart- Bradycardic regular rate and rhythm, no murmurs, rubs or gallops  GI- soft, non-tender, non-distended, bowel sounds present  Extremities- no clubbing, cyanosis, or edema; DP/PT/radial pulses 2+ bilaterally MS- no significant deformity or atrophy Skin- warm and dry, no rash or lesion Psych- euthymic mood, full affect Neuro- strength and sensation are intact  Labs:   Lab Results  Component Value Date   WBC 6.0 05/14/2015   HGB 13.9 05/14/2015   HCT 41.0 05/14/2015   MCV 92.4 05/14/2015   PLT 181 05/14/2015     Recent Labs Lab 05/14/15 0950 05/14/15 1016  NA 138 141  K 4.4 4.4  CL 108 107  CO2 22  --   BUN 16 18  CREATININE 1.00 1.00  CALCIUM 8.9  --   PROT 6.5  --   BILITOT 0.6  --   ALKPHOS 46  --   ALT 13*  --   AST 23  --   GLUCOSE 104* 96      Radiology/Studies: Ct Head Wo Contrast 05/14/2015   CLINICAL DATA:  Several days of dizziness and not feeling RIGHT, fell onto bed this morning, headache, history MI, hypertension, PE, DVT  EXAM: CT HEAD WITHOUT CONTRAST  TECHNIQUE: Contiguous axial images were obtained from the base of the skull through the vertex without intravenous contrast.  COMPARISON:  03/08/2015  FINDINGS: Generalized atrophy.  Diffuse prominence of the subarachnoid space unchanged, traversed by vessels.  No midline shift or mass effect.  Small vessel chronic ischemic changes of deep cerebral white matter, mild.  LEFT occipital encephalomalacia consistent with old infarct.  No intracranial hemorrhage, mass lesion or evidence acute infarction.  Small amount of chronic fluid/mucous in LEFT sphenoid sinus.  Bones and sinuses otherwise unremarkable.  IMPRESSION: Atrophy with minimal small vessel chronic ischemic changes of deep cerebral white matter.  Old LEFT occipital infarct.  No acute  intracranial abnormalities.   Electronically Signed   By: Ulyses Southward M.D.   On: 05/14/2015 10:42    EKG: complete heart block, ventricular rate 30, LBBB  TELEMETRY: complete heart block, ventricular rates 30-40  Assessment/Plan: 1.  Complete heart block The patient has symptomatic complete heart block.  Will need to allow Bystolic to wash out prior to pacemaker implantation. Last dose of bystolic yesterday morning.  Will re-evaluate  in the morning, if still in heart block, will proceed with PPM implant tomorrow afternoon. Repeat echo  2.  HTN Stable No change required today  3.  RVOT VT Palpitations stable while on Bystolic Will follow  Signed, Gypsy Balsam, NP 05/14/2015 11:06 AM    I have seen, examined the patient, and reviewed the above assessment and plan.  On exam, she is stable but bradycardic. Changes to above are made where necessary.  Last dose of bystolic was yesterday am.  Will allow to wash out today.  If remains with Av block tomorrow, I would advise PPM implantation.  She knows Dr [name] well and would prefer that he implant the device. Risks of PPM implantation were discussed with the patient who wishes to proceed.  Co Sign: Hillis Range, MD 05/14/2015 1:56 PM   Addendum: Called back by nursing staff for progressive bradycardia with prolonged RR intervals and worsening symptoms.  Bystolic washout has not improved AV conduction.  I would therefore advise that we proceed urgently with PPM at this time. Risks, benefits, alternatives to pacemaker implantation were discussed in detail with the patient today. The patient understands that the risks include but are not limited to bleeding, infection, pneumothorax, perforation, tamponade, vascular damage, renal failure, MI, stroke, death,  and lead dislodgement and wishes to proceed.    Hillis Range MD, [facility] 05/14/2015 4:15 PM

## 2015-05-14 NOTE — Progress Notes (Signed)
Dr. Johney Frame at bedside to evaluate pt.  Discussed increasing numbers of pauses and increased length of pauses with patient.  Decision made to insert pacemaker today.  Dr. Johney Frame called daughter to update family on situation.  Emotional support given to pt.  Will continue to monitor pt closely.

## 2015-05-14 NOTE — ED Notes (Signed)
Attempted report 

## 2015-05-14 NOTE — Care Management Note (Signed)
Case Management Note  Patient Details  Name: [name] [name] MRN: [id number] Date of Birth: September 07, 1924  Subjective/Objective:     Adm w syncope, brady               Action/Plan: lives w fam, pcp dr Molly Maduro dough  Expected Discharge Date:                  Expected Discharge Plan:     In-House Referral:     Discharge planning Services     Post Acute Care Choice:    Choice offered to:     DME Arranged:    DME Agency:     HH Arranged:    HH Agency:     Status of Service:     Medicare Important Message Given:    Date Medicare IM Given:    Medicare IM give by:    Date Additional Medicare IM Given:    Additional Medicare Important Message give by:     If discussed at Long Length of Stay Meetings, dates discussed:    Additional Comments: ur review done  Hanley Hays, RN 05/14/2015, 12:58 PM

## 2015-05-14 NOTE — H&P (Signed)
Patient ID: [name] [name] MRN: [id number], DOB/AGE: [birth year]   Admit date: 05/14/2015   Primary Physician: [name], MD Primary Cardiologist: Dr. Sherryl Manges  Pt. Profile:  This 81 year old woman presents to the emergency room with near syncope and high-grade AV block.  Problem List  Past Medical History  Diagnosis Date  . IBS (irritable bowel syndrome)   . Diverticulosis   . Colon polyps   . High cholesterol   . MI (myocardial infarction) ?? probably not 1975    a. 1975 Reported MI;  b. 2011 Cath: nonobs dzs;  c. 2013 Nonischemic Myoview;  d. 12/2012 Echo: EF 60-65%, Gr1 DD.  Marland Kitchen GERD (gastroesophageal reflux disease)   . Hypertension   . Nephrolithiasis   . Bronchiectasis, non-tuberculous     a. Followed by Dr. Delford Field.  Marland Kitchen DVT (deep venous thrombosis)     Resolved, no anticoagulation currently  . 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 Palpitation     a. 2010 Event Monitor: PACs and PVCs  . Pulmonary fibrosis   . First degree AV block     Conduction system  . Wide-complex tachycardia     a. Followed by Dr. [name], "I think this is VT, but i am not sure QRSd 135 1:1 AV."  . Pulmonary embolism     Following knee surgery with a prior history  . COPD (chronic obstructive pulmonary disease)     past hx with pneumonia  . Stroke     Past Surgical History  Procedure Laterality Date  . Total abdominal hysterectomy    . Appendectomy    . Bladder tack    . Cholecystectomy    . Colon surgery      12inches colectomy s/p diverticulitis  . Tonsillectomy    . Total knee arthroplasty  12/12/2012    Procedure: TOTAL KNEE ARTHROPLASTY;  Surgeon: Harvie Junior, MD;  Location: [facility] OR;  Service: Orthopedics;  Laterality: Right;  right total knee arthroplasty     Allergies  Allergies  Allergen Reactions  . Amiodarone     Did not tolerate  . Crestor [Rosuvastatin] Other (See Comments)    Myalgia  . Darvon     Drop in  BP  . Prednisone Other (See  Comments)    polyuria    HPI  This 81 year old woman from [place] presents to the emergency room today after an episode of near syncope at home.  She was standing next to her bed and essentially had an episode of sudden weakness and fell into the bed.  She did not suffer any injury.  The patient has been feeling weak for several weeks.  She has had intermittent episodes of lightheadedness.  She has been having headaches for several weeks.  She has been aware of intermittent chest pressure.  She recently completed a course of Cipro antibiotically from Dr. Delford Field for treatment of an acute exacerbation of her bronchiectasis.  She reports that her cough has improved.  She is not running any fever. Her cardiac history reveals that she is followed by Dr. [name].  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  At one point consideration of ventricular tachycardia ablation was discussed but patient and family elected to pursue medical treatment.  The patient has been on Bystolic 5 mg daily with good control of her palpitations.  The patient does not have any history of ischemic heart disease.  In January 2015 the patient had a [place] nuclear stress  test which was negative for ischemia.  She also had an echocardiogram showing normal left ventricular ejection fraction and no significant valve abnormalities.  Her current home cardiac medications are atorvastatin 20 mg daily and nebivolol 5 mg daily.  Home Medications  Prior to Admission medications   Medication Sig Start Date End Date Taking? Authorizing Provider  atorvastatin (LIPITOR) 20 MG tablet Take 20 mg by mouth Daily.    Yes Historical Provider, MD  beclomethasone (QVAR) 80 MCG/ACT inhaler Inhale 2 puffs into the lungs 2 (two) times daily. 12/11/14  Yes Storm Frisk, MD  D3-50 50000 UNITS capsule Take by mouth once a week.  01/11/15  Yes Historical Provider, MD  nebivolol (BYSTOLIC) 5 MG tablet Take one  tablet by mouth daily 10/25/14  Yes Duke Salvia, MD  omeprazole (PRILOSEC) 40 MG capsule Take 1 capsule by mouth daily as needed (heartburn).    Yes Historical Provider, MD  Respiratory Therapy Supplies (FLUTTER) DEVI Use 3-4 times per day 01/29/15  Yes Storm Frisk, MD  Tiotropium Bromide Monohydrate (SPIRIVA RESPIMAT) 2.5 MCG/ACT AERS Two puff daily Patient taking differently: Inhale 2 puffs into the lungs daily.  12/11/14  Yes Storm Frisk, MD  ciprofloxacin (CIPRO) 750 MG tablet Take 1 tablet (750 mg total) by mouth 2 (two) times daily. Patient not taking: Reported on 05/14/2015 04/30/15   Storm Frisk, MD  hydrochlorothiazide (HYDRODIURIL) 25 MG tablet Take 0.5 tablets (12.5 mg total) by mouth daily. Patient not taking: Reported on 05/14/2015 11/27/13   Ok Anis, NP  traMADol Janean Sark) 50 MG tablet One every 4 hours as needed for severe cough Patient not taking: Reported on 05/14/2015 02/28/15   Nyoka Cowden, MD    Family History  Family History  Problem Relation Age of Onset  . Stomach cancer Brother   . Heart disease Father   . Heart attack Mother   . Other Son     Triple Bypass    Social History  History   Social History  . Marital Status: Married    Spouse Name: N/A  . Number of Children: 3  . Years of Education: N/A   Occupational History  . Retired     [employer]   Social History Main Topics  . Smoking status: Never Smoker   . Smokeless tobacco: Never Used  . Alcohol Use: No  . Drug Use: No  . Sexual Activity: Not on file   Other Topics Concern  . Not on file   Social History Narrative     Review of Systems General:  No chills, fever, night sweats or weight changes.  Cardiovascular:  No  dyspnea on exertion, edema, orthopnea, palpitations, paroxysmal nocturnal dyspnea. Dermatological: No rash, lesions/masses Respiratory: Recent treatment by Dr. Danise Mina for bronchiectasis with acute exacerbation Urologic: No hematuria,  dysuria Abdominal:   No nausea, vomiting, diarrhea, bright red blood per rectum, melena, or hematemesis Neurologic:  No visual changes, wkns, changes in mental status.  Positive for weakness and near syncopal episodes   All other systems reviewed and are otherwise negative except as noted above.  Physical Exam  Blood pressure 176/58, pulse 41, temperature 97.5 F (36.4 C), temperature source Oral, resp. rate 22, SpO2 100 %.  General: Pleasant, NAD Psych: Normal affect. Neuro: Alert and oriented X 3. Moves all extremities spontaneously. HEENT: Normal  Neck: Supple without bruits or JVD. Lungs:  Resp regular and unlabored, scattered rhonchi Heart: RRR no s3, s4, or murmurs. Abdomen: Soft,  non-tender, non-distended, BS + x 4.  Good femoral pulses Extremities: No clubbing, cyanosis or edema. DP/PT/Radials 1+ and equal bilaterally.  Labs  Troponin [facility] of Care Test)  Recent Labs  05/14/15 1014  TROPIPOC 0.00   No results for input(s): CKTOTAL, CKMB, TROPONINI in the last 72 hours. Lab Results  Component Value Date   WBC 6.0 05/14/2015   HGB 13.9 05/14/2015   HCT 41.0 05/14/2015   MCV 92.4 05/14/2015   PLT 181 05/14/2015     Recent Labs Lab 05/14/15 0950 05/14/15 1016  NA 138 141  K 4.4 4.4  CL 108 107  CO2 22  --   BUN 16 18  CREATININE 1.00 1.00  CALCIUM 8.9  --   PROT 6.5  --   BILITOT 0.6  --   ALKPHOS 46  --   ALT 13*  --   AST 23  --   GLUCOSE 104* 96   No results found for: CHOL, HDL, LDLCALC, TRIG No results found for: DDIMER   Radiology/Studies  Ct Head Wo Contrast  05/14/2015   CLINICAL DATA:  Several days of dizziness and not feeling RIGHT, fell onto bed this morning, headache, history MI, hypertension, PE, DVT  EXAM: CT HEAD WITHOUT CONTRAST  TECHNIQUE: Contiguous axial images were obtained from the base of the skull through the vertex without intravenous contrast.  COMPARISON:  03/08/2015  FINDINGS: Generalized atrophy.  Diffuse prominence of the  subarachnoid space unchanged, traversed by vessels.  No midline shift or mass effect.  Small vessel chronic ischemic changes of deep cerebral white matter, mild.  LEFT occipital encephalomalacia consistent with old infarct.  No intracranial hemorrhage, mass lesion or evidence acute infarction.  Small amount of chronic fluid/mucous in LEFT sphenoid sinus.  Bones and sinuses otherwise unremarkable.  IMPRESSION: Atrophy with minimal small vessel chronic ischemic changes of deep cerebral white matter.  Old LEFT occipital infarct.  No acute intracranial abnormalities.   Electronically Signed   By: Ulyses Southward M.D.   On: 05/14/2015 10:42    ECG  EKG #1 done at 09 29 shows normal sinus rhythm with heart rate of 55 and PR interval of 408 ms and a pattern of right bundle branch block.  EKG #2 done at 09 33 shows normal sinus rhythm with periods of Mobitz 2 block  EKG #3 done at 0959 after she had received intravenous atropine shows an increase in her sinus rate with higher grade of AV block and ventricular rate of 30 with a wide complex QRS.  ASSESSMENT AND PLAN  1.  Presyncopal episodes secondary to marked bradycardia secondary to intermittent high-grade AV block.  New right bundle branch block.  Intermittent wide complex ventricular escape rhythm. 2.  Past history of suspected right ventricular outflow tract VT for which she has been on nebivolol. 3.  Bronchiectasis, improved after treatment of recent exacerbation with one-week course of Cipro. 4.  Recent headaches.  CT scan of head today negative for acute pathology. 5.  Chest pressure, intermittent.  No history of known coronary disease.  Initial troponin is normal. 6.  Essential hypertension 7.  Hypercholesterolemia, on statin therapy  Plan: Admit to intensive care unit.  Close observation.  Hold nebivolol.  EP will be seeing.  May require pacemaker.  Karie Schwalbe MD  05/14/2015, 10:46 AM

## 2015-05-14 NOTE — Progress Notes (Signed)
Dr. Patty Sermons notified of pt complaining of shortness of breath.  RN has initiated oxygen therapy and offered to raise head of bed.  Pt states she feels better with the head of the bed down.  No orders received.  Will continue to monitor pt closely.

## 2015-05-14 NOTE — Interval H&P Note (Signed)
History and Physical Interval Note:  05/14/2015 4:45 PM  [name] [name]  has presented today for surgery, with the diagnosis of heart block  The various methods of treatment have been discussed with the patient and family. After consideration of risks, benefits and other options for treatment, the patient has consented to  Procedure(s): Pacemaker Implant (N/A) Temporary Pacemaker (Right) as a surgical intervention .  The patient's history has been reviewed, patient examined, no change in status, stable for surgery.  I have reviewed the patient's chart and labs.  Questions were answered to the patient's satisfaction.     Hillis Range

## 2015-05-14 NOTE — ED Notes (Signed)
Pt returned from CT °

## 2015-05-14 NOTE — H&P (View-Only) (Signed)
ELECTROPHYSIOLOGY CONSULT NOTE    Patient ID: [name] [name] MRN: [id number], DOB/AGE: Nov 03, 1924 81 y.o.  Admit date: 05/14/2015 Date of Consult: 05/14/2015  Primary Physician: [name], MD Primary Cardiologist: [name]  Reason for Consultation: bradycardia and heart block  HPI:  [name] [name] is a 81 y.o. female with a past medical history significant for CAD, hypertension, pulmonary fibrosis, COPD, prior PE, and RVOT VT.  She has been maintained on Bystolic for several years for control of palpitations which has worked well.  This morning, while at home, she had an episode of near syncope that has been preceded by a several week history of weakness and exercise intolerance.  She presented to the ER for further evaluation and was found to be in high grade AV block with ventricular rates in the 40's.  EP has been asked to evaluate for treatment options.  Last echo 11/2013 demonstrated normal EF, no RWMA, grade 1 diastolic dysfunction, mild MR.  Repeat echo pending this admission.    Lab work is unremarkable.  She is complaining of being tired and her head feeling heavy. CT of the head demonstrated old left occipital infarct with no acute abnormalities.    Past Medical History  Diagnosis Date  . IBS (irritable bowel syndrome)   . Diverticulosis   . Colon polyps   . High cholesterol   . MI (myocardial infarction) ?? probably not 1975    a. 1975 Reported MI;  b. 2011 Cath: nonobs dzs;  c. 2013 Nonischemic Myoview;  d. 12/2012 Echo: EF 60-65%, Gr1 DD.  Marland Kitchen GERD (gastroesophageal reflux disease)   . Hypertension   . Nephrolithiasis   . Bronchiectasis, non-tuberculous     a. Followed by Dr. Delford Field.  Marland Kitchen DVT (deep venous thrombosis)     Resolved, no anticoagulation currently  . 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 Palpitation     a. 2010 Event Monitor: PACs and PVCs  . Pulmonary fibrosis   . First degree AV block     Conduction system  . Wide-complex tachycardia       a. Followed by Dr. [name], "I think this is VT, but i am not sure QRSd 135 1:1 AV."  . Pulmonary embolism     Following knee surgery with a prior history  . COPD (chronic obstructive pulmonary disease)     past hx with pneumonia  . Stroke      Surgical History:  Past Surgical History  Procedure Laterality Date  . Total abdominal hysterectomy    . Appendectomy    . Bladder tack    . Cholecystectomy    . Colon surgery      12inches colectomy s/p diverticulitis  . Tonsillectomy    . Total knee arthroplasty  12/12/2012    Procedure: TOTAL KNEE ARTHROPLASTY;  Surgeon: Harvie Junior, MD;  Location: [facility] OR;  Service: Orthopedics;  Laterality: Right;  right total knee arthroplasty     Allergies:  Allergies  Allergen Reactions  . Amiodarone     Did not tolerate  . Crestor [Rosuvastatin] Other (See Comments)    Myalgia  . Darvon     Drop in  BP  . Prednisone Other (See Comments)    polyuria    History   Social History  . Marital Status: Married    Spouse Name: N/A  . Number of Children: 3  . Years of Education: N/A   Occupational History  . Retired     [employer]  History Main Topics  . Smoking status: Never Smoker   . Smokeless tobacco: Never Used  . Alcohol Use: No  . Drug Use: No  . Sexual Activity: Not on file   Other Topics Concern  . Not on file   Social History Narrative     Family History  Problem Relation Age of Onset  . Stomach cancer Brother   . Heart disease Father   . Heart attack Mother   . Other Son     Triple Bypass     Review of Systems: All other systems reviewed and are otherwise negative except as noted above.  Physical Exam: Filed Vitals:   05/14/15 1005 05/14/15 1010 05/14/15 1011 05/14/15 1015  BP:    176/58  Pulse: 85 40 43 41  Temp:      TempSrc:      Resp: SpO2: 100% 100% 100% 100%    GEN- The patient is elderly appearing, alert and oriented x 3 today.   HEENT: normocephalic, atraumatic;  sclera clear, conjunctiva pink; hearing intact; oropharynx clear; neck supple  Lungs- Clear to ausculation bilaterally, normal work of breathing.  No wheezes, rales, rhonchi Heart- Bradycardic regular rate and rhythm, no murmurs, rubs or gallops  GI- soft, non-tender, non-distended, bowel sounds present  Extremities- no clubbing, cyanosis, or edema; DP/PT/radial pulses 2+ bilaterally MS- no significant deformity or atrophy Skin- warm and dry, no rash or lesion Psych- euthymic mood, full affect Neuro- strength and sensation are intact  Labs:   Lab Results  Component Value Date   WBC 6.0 05/14/2015   HGB 13.9 05/14/2015   HCT 41.0 05/14/2015   MCV 92.4 05/14/2015   PLT 181 05/14/2015     Recent Labs Lab 05/14/15 0950 05/14/15 1016  NA 138 141  K 4.4 4.4  CL 108 107  CO2 22  --   BUN 16 18  CREATININE 1.00 1.00  CALCIUM 8.9  --   PROT 6.5  --   BILITOT 0.6  --   ALKPHOS 46  --   ALT 13*  --   AST 23  --   GLUCOSE 104* 96      Radiology/Studies: Ct Head Wo Contrast 05/14/2015   CLINICAL DATA:  Several days of dizziness and not feeling RIGHT, fell onto bed this morning, headache, history MI, hypertension, PE, DVT  EXAM: CT HEAD WITHOUT CONTRAST  TECHNIQUE: Contiguous axial images were obtained from the base of the skull through the vertex without intravenous contrast.  COMPARISON:  03/08/2015  FINDINGS: Generalized atrophy.  Diffuse prominence of the subarachnoid space unchanged, traversed by vessels.  No midline shift or mass effect.  Small vessel chronic ischemic changes of deep cerebral white matter, mild.  LEFT occipital encephalomalacia consistent with old infarct.  No intracranial hemorrhage, mass lesion or evidence acute infarction.  Small amount of chronic fluid/mucous in LEFT sphenoid sinus.  Bones and sinuses otherwise unremarkable.  IMPRESSION: Atrophy with minimal small vessel chronic ischemic changes of deep cerebral white matter.  Old LEFT occipital infarct.  No acute  intracranial abnormalities.   Electronically Signed   By: Ulyses Southward M.D.   On: 05/14/2015 10:42    EKG: complete heart block, ventricular rate 30, LBBB  TELEMETRY: complete heart block, ventricular rates 30-40  Assessment/Plan: 1.  Complete heart block The patient has symptomatic complete heart block.  Will need to allow Bystolic to wash out prior to pacemaker implantation. Last dose of bystolic yesterday morning.  Will re-evaluate  in the morning, if still in heart block, will proceed with PPM implant tomorrow afternoon. Repeat echo  2.  HTN Stable No change required today  3.  RVOT VT Palpitations stable while on Bystolic Will follow  Signed, Gypsy Balsam, NP 05/14/2015 11:06 AM    I have seen, examined the patient, and reviewed the above assessment and plan.  On exam, she is stable but bradycardic. Changes to above are made where necessary.  Last dose of bystolic was yesterday am.  Will allow to wash out today.  If remains with Av block tomorrow, I would advise PPM implantation.  She knows Dr [name] well and would prefer that he implant the device. Risks of PPM implantation were discussed with the patient who wishes to proceed.  Co Sign: [name] Range, MD 05/14/2015 1:56 PM   Addendum: Called back by nursing staff for progressive bradycardia with prolonged RR intervals and worsening symptoms.  Bystolic washout has not improved AV conduction.  I would therefore advise that we proceed urgently with PPM at this time. Risks, benefits, alternatives to pacemaker implantation were discussed in detail with the patient today. The patient understands that the risks include but are not limited to bleeding, infection, pneumothorax, perforation, tamponade, vascular damage, renal failure, MI, stroke, death,  and lead dislodgement and wishes to proceed.    [name] Range MD, [facility] 05/14/2015 4:15 PM

## 2015-05-14 NOTE — ED Notes (Signed)
Received pt from home with c/o several days feeling dizzy and not quite right. Today pt was making up the bed when she fell onto the bed. Pt also c/o headache.

## 2015-05-14 NOTE — ED Notes (Signed)
0.5 amp atropine given per MD verbal orders at bedside.

## 2015-05-14 NOTE — Progress Notes (Signed)
Dr. Johney Frame notified (through cath lab personnel) of pt having increasing number of pauses as well as increased length of pauses.  Pt also reporting feeling like she is "going to pass out" with these increased episodes.  Will continue to monitor pt closely.

## 2015-05-14 NOTE — ED Provider Notes (Signed)
CSN: [id number]     Arrival date & time 05/14/15  0928 History   First MD Initiated Contact with Patient 05/14/15 [phone]     Chief Complaint  Patient presents with  . Headache  . Eye Problem  . Dizziness     (Consider location/radiation/quality/duration/timing/severity/associated sxs/prior Treatment) Patient is a 81 y.o. female presenting with headaches, eye problem, and dizziness.  Headache Associated symptoms: cough and dizziness   Associated symptoms: no abdominal pain, no back pain, no diarrhea, no eye pain, no nausea, no neck stiffness, no numbness, no vomiting and no weakness   Eye Problem Associated symptoms: headaches   Associated symptoms: no nausea, no numbness, no vomiting and no weakness   Dizziness Associated symptoms: headaches   Associated symptoms: no chest pain, no diarrhea, no nausea, no shortness of breath, no vomiting and no weakness    patient presents with episodes of lightheadedness. Worse today but has had some more last few days. States she just feels kind of weak. States she was trying to make the bed today and she fell over. No injury from falling. She also had a dull headache. It is throbbing off on her head. No chest pain. No fevers. No recent change in her medication. No vision changes. No confusion. No fevers or chills. No change in her somewha  chronic cough.  Past Medical History  Diagnosis Date  . IBS (irritable bowel syndrome)   . Diverticulosis   . Colon polyps   . High cholesterol   . MI (myocardial infarction) ?? probably not 1975    a. 1975 Reported MI;  b. 2011 Cath: nonobs dzs;  c. 2013 Nonischemic Myoview;  d. 12/2012 Echo: EF 60-65%, Gr1 DD.  Marland Kitchen GERD (gastroesophageal reflux disease)   . Hypertension   . Nephrolithiasis   . Bronchiectasis, non-tuberculous     a. Followed by Dr. Delford Field.  Marland Kitchen DVT (deep venous thrombosis)     Resolved, no anticoagulation currently  . 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 Palpitation     a.  2010 Event Monitor: PACs and PVCs  . Pulmonary fibrosis   . First degree AV block     Conduction system  . Wide-complex tachycardia     a. Followed by Dr. [name], "I think this is VT, but i am not sure QRSd 135 1:1 AV."  . Pulmonary embolism     Following knee surgery with a prior history  . COPD (chronic obstructive pulmonary disease)     past hx with pneumonia  . Stroke    Past Surgical History  Procedure Laterality Date  . Total abdominal hysterectomy    . Appendectomy    . Bladder tack    . Cholecystectomy    . Colon surgery      12inches colectomy s/p diverticulitis  . Tonsillectomy    . Total knee arthroplasty  12/12/2012    Procedure: TOTAL KNEE ARTHROPLASTY;  Surgeon: Harvie Junior, MD;  Location: [facility] OR;  Service: Orthopedics;  Laterality: Right;  right total knee arthroplasty   Family History  Problem Relation Age of Onset  . Stomach cancer Brother   . Heart disease Father   . Heart attack Mother   . Other Son     Triple Bypass   History  Substance Use Topics  . Smoking status: Never Smoker   . Smokeless tobacco: Never Used  . Alcohol Use: No   OB History    No data available     Review of Systems  Constitutional: Negative for activity change and appetite change.  Eyes: Negative for pain.  Respiratory: Positive for cough. Negative for chest tightness and shortness of breath.   Cardiovascular: Negative for chest pain and leg swelling.  Gastrointestinal: Negative for nausea, vomiting, abdominal pain and diarrhea.  Genitourinary: Negative for flank pain.  Musculoskeletal: Negative for back pain and neck stiffness.  Skin: Negative for rash.  Neurological: Positive for dizziness and headaches. Negative for weakness and numbness.  Psychiatric/Behavioral: Negative for behavioral problems.      Allergies  Amiodarone; Crestor; Darvon; and Prednisone  Home Medications   Prior to Admission medications   Medication Sig Start Date End Date Taking? Authorizing  Provider  atorvastatin (LIPITOR) 20 MG tablet Take 20 mg by mouth Daily.    Yes Historical Provider, MD  beclomethasone (QVAR) 80 MCG/ACT inhaler Inhale 2 puffs into the lungs 2 (two) times daily. 12/11/14  Yes Storm Frisk, MD  D3-50 50000 UNITS capsule Take by mouth once a week.  01/11/15  Yes Historical Provider, MD  nebivolol (BYSTOLIC) 5 MG tablet Take one tablet by mouth daily 10/25/14  Yes Duke Salvia, MD  omeprazole (PRILOSEC) 40 MG capsule Take 1 capsule by mouth daily as needed (heartburn).    Yes Historical Provider, MD  Respiratory Therapy Supplies (FLUTTER) DEVI Use 3-4 times per day 01/29/15  Yes Storm Frisk, MD  Tiotropium Bromide Monohydrate (SPIRIVA RESPIMAT) 2.5 MCG/ACT AERS Two puff daily Patient taking differently: Inhale 2 puffs into the lungs daily.  12/11/14  Yes Storm Frisk, MD  ciprofloxacin (CIPRO) 750 MG tablet Take 1 tablet (750 mg total) by mouth 2 (two) times daily. Patient not taking: Reported on 05/14/2015 04/30/15   Storm Frisk, MD  hydrochlorothiazide (HYDRODIURIL) 25 MG tablet Take 0.5 tablets (12.5 mg total) by mouth daily. Patient not taking: Reported on 05/14/2015 11/27/13   Ok Anis, NP  traMADol Janean Sark) 50 MG tablet One every 4 hours as needed for severe cough Patient not taking: Reported on 05/14/2015 02/28/15   Nyoka Cowden, MD   BP 176/58 mmHg  Pulse 41  Temp(Src) 97.5 F (36.4 C) (Oral)  Resp 22  SpO2 100% Physical Exam  Constitutional: She appears well-developed.  HENT:  Head: Normocephalic.  Eyes: Pupils are equal, round, and reactive to light.  Neck: Neck supple.  Cardiovascular:  Bradycardia  Pulmonary/Chest: Effort normal.  Abdominal: Soft.  Musculoskeletal: Normal range of motion.  Neurological: She is alert.  Skin: Skin is warm.    ED Course  Procedures (including critical care time) Labs Review Labs Reviewed  CBC WITH DIFFERENTIAL/PLATELET  COMPREHENSIVE METABOLIC PANEL  MAGNESIUM  TSH  I-STAT CHEM 8,  ED  I-STAT TROPOININ, ED    Imaging Review No results found.   EKG Interpretation   Date/Time:  Tuesday May 14 2015 09:59:05 EDT Ventricular Rate:  30 PR Interval:  466 QRS Duration: 139 QT Interval:  610 QTC Calculation: 431 R Axis:   -82 Text Interpretation:  indeterminate rhythm Prolonged PR interval  Nonspecific IVCD with LAD Left ventricular hypertrophy Probable anterior  infarct, age indeterminate Lateral leads are also involved Confirmed by  Rubin Payor  MD, Shataya Winkles [phone]) on 05/14/2015 10:02:26 AM        MDM   Final diagnoses:  Heart block  Symptomatic bradycardia    Patient presents with weakness. Found to be in a heart block and symptomatic bradycardia. No response to atropine. Cardiology called urgently. Zolls placed on the patient. He is on a beta  blocker. No emergent pacing since blood pressure is maintained and will let the beta blocker tablets.  CRITICAL CARE Performed by: Billee Cashing Total critical care time: 30 Critical care time was exclusive of separately billable procedures and treating other patients. Critical care was necessary to treat or prevent imminent or life-threatening deterioration. Critical care was time spent personally by me on the following activities: development of treatment plan with patient and/or surrogate as well as nursing, discussions with consultants, evaluation of patient's response to treatment, examination of patient, obtaining history from patient or surrogate, ordering and performing treatments and interventions, ordering and review of laboratory studies, ordering and review of radiographic studies, pulse oximetry and re-evaluation of patient's condition.     Benjiman Core, MD 05/16/15 [phone]

## 2015-05-14 NOTE — Progress Notes (Signed)
Echocardiogram 2D Echocardiogram has been performed.  [name] [name] 05/14/2015, 4:02 PM

## 2015-05-15 ENCOUNTER — Encounter (HOSPITAL_COMMUNITY): Payer: Self-pay | Admitting: Internal Medicine

## 2015-05-15 ENCOUNTER — Inpatient Hospital Stay (HOSPITAL_COMMUNITY): Payer: Medicare HMO

## 2015-05-15 LAB — BASIC METABOLIC PANEL
ANION GAP: 6 (ref 5–15)
BUN: 15 mg/dL (ref 6–20)
CHLORIDE: 108 mmol/L (ref 101–111)
CO2: 26 mmol/L (ref 22–32)
Calcium: 9 mg/dL (ref 8.9–10.3)
Creatinine, Ser: 1.05 mg/dL — ABNORMAL HIGH (ref 0.44–1.00)
GFR calc non Af Amer: 46 mL/min — ABNORMAL LOW (ref >60)
GFR, EST AFRICAN AMERICAN: 53 mL/min — AB (ref >60)
Glucose, Bld: 103 mg/dL — ABNORMAL HIGH (ref 65–99)
POTASSIUM: 4 mmol/L (ref 3.5–5.1)
Sodium: 140 mmol/L (ref 135–145)

## 2015-05-15 MED ORDER — DOCUSATE SODIUM 100 MG PO CAPS: 100.0000 mg | ORAL_CAPSULE | Freq: Every day | ORAL | Status: DC | PRN

## 2015-05-20 ENCOUNTER — Ambulatory Visit: Payer: Medicare HMO | Admitting: Critical Care Medicine

## 2015-05-29 ENCOUNTER — Encounter: Payer: Self-pay | Admitting: Internal Medicine

## 2015-05-29 ENCOUNTER — Ambulatory Visit (INDEPENDENT_AMBULATORY_CARE_PROVIDER_SITE_OTHER): Payer: Medicare HMO | Admitting: *Deleted

## 2015-05-29 DIAGNOSIS — Z950 Presence of cardiac pacemaker: Secondary | ICD-10-CM | POA: Diagnosis not present

## 2015-05-29 DIAGNOSIS — I459 Conduction disorder, unspecified: Secondary | ICD-10-CM | POA: Diagnosis not present

## 2015-05-29 LAB — CUP PACEART INCLINIC DEVICE CHECK
Battery Impedance: 100 Ohm
Battery Remaining Longevity: 121 mo
Battery Voltage: 2.79 V
Brady Statistic AP VP Percent: 39 %
Brady Statistic AP VS Percent: 0 %
Brady Statistic AS VP Percent: 61 %
Brady Statistic AS VS Percent: 0 %
Date Time Interrogation Session: 20160720124116
Lead Channel Impedance Value: 584 Ohm
Lead Channel Pacing Threshold Amplitude: 0.5 V
Lead Channel Pacing Threshold Pulse Width: 0.4 ms
Lead Channel Setting Pacing Amplitude: 3.5 V
Lead Channel Setting Pacing Pulse Width: 0.4 ms
Lead Channel Setting Sensing Sensitivity: 4 mV
MDC IDC MSMT LEADCHNL RA PACING THRESHOLD PULSEWIDTH: 0.4 ms
MDC IDC MSMT LEADCHNL RA SENSING INTR AMPL: 2.8 mV
MDC IDC MSMT LEADCHNL RV IMPEDANCE VALUE: 769 Ohm
MDC IDC MSMT LEADCHNL RV PACING THRESHOLD AMPLITUDE: 0.75 V
MDC IDC MSMT LEADCHNL RV SENSING INTR AMPL: 11.2 mV
MDC IDC SET LEADCHNL RV PACING AMPLITUDE: 3.5 V

## 2015-05-29 NOTE — Progress Notes (Signed)
redness or edema. Incision edges approximated, wound well healed. Normal device function. Thresholds, sensing, and impedances consistent with implant measurements. Device programmed at 3.5V/auto capture programmed on for extra safety margin until 3 month visit. Histogram distribution appropriate for patient and level of activity. 32 mode switches--- 0.2%, Max-A 166bpm, longest episode 39min40sec. No high ventricular rates noted. Patient educated about wound care, arm mobility, lifting restrictions. ROV w/ SK 09/02/15.

## 2015-06-05 ENCOUNTER — Telehealth: Payer: Self-pay | Admitting: Internal Medicine

## 2015-06-05 NOTE — Telephone Encounter (Signed)
Pt c/o of Chest Pain: STAT if CP now or developed within 24 hours  1. Are you having CP right now?    yes  2. Are you experiencing any other symptoms (ex. SOB, nausea, vomiting, sweating)?   no  3. How long have you been experiencing CP? 2 or 3 days  4. Is your CP continuous or coming and going? Coming and going   5. Have you taken Nitroglycerin? no ?

## 2015-06-05 NOTE — Telephone Encounter (Signed)
Pain intermittent throat to chest for 4-5 days.  Has also felt short of breath and just out when she tries to do any activity.  HR 86 with B/P 170/80, said that  Her heart feels like it is pounding  Instructed patient per Huey Bienenstock to come tomorrow at 230pm for appointment.  Advised patient if pain would come and not go away or if she has worsening symptoms she must go to the ED.  Patient agrees to plan.

## 2015-06-06 ENCOUNTER — Encounter: Payer: Self-pay | Admitting: Internal Medicine

## 2015-06-06 ENCOUNTER — Ambulatory Visit (INDEPENDENT_AMBULATORY_CARE_PROVIDER_SITE_OTHER): Payer: Medicare HMO | Admitting: Physician Assistant

## 2015-06-06 ENCOUNTER — Ambulatory Visit (INDEPENDENT_AMBULATORY_CARE_PROVIDER_SITE_OTHER): Payer: Medicare HMO | Admitting: *Deleted

## 2015-06-06 ENCOUNTER — Encounter: Payer: Self-pay | Admitting: Physician Assistant

## 2015-06-06 VITALS — BP 140/88 | HR 73 | Ht 66.0 in | Wt 150.2 lb

## 2015-06-06 DIAGNOSIS — R079 Chest pain, unspecified: Secondary | ICD-10-CM

## 2015-06-06 DIAGNOSIS — I471 Supraventricular tachycardia: Secondary | ICD-10-CM

## 2015-06-06 LAB — CUP PACEART INCLINIC DEVICE CHECK
Battery Voltage: 2.79 V
Brady Statistic AS VP Percent: 66 %
Brady Statistic AS VS Percent: 1 %
Date Time Interrogation Session: 20160728160526
Lead Channel Impedance Value: 549 Ohm
Lead Channel Impedance Value: 863 Ohm
Lead Channel Pacing Threshold Amplitude: 0.75 V
Lead Channel Pacing Threshold Pulse Width: 0.4 ms
Lead Channel Pacing Threshold Pulse Width: 0.4 ms
Lead Channel Sensing Intrinsic Amplitude: 11.2 mV
Lead Channel Sensing Intrinsic Amplitude: 2.8 mV
Lead Channel Setting Pacing Amplitude: 3.5 V
Lead Channel Setting Pacing Amplitude: 3.5 V
Lead Channel Setting Pacing Pulse Width: 0.4 ms
Lead Channel Setting Sensing Sensitivity: 4 mV
MDC IDC MSMT BATTERY IMPEDANCE: 100 Ohm
MDC IDC MSMT BATTERY REMAINING LONGEVITY: 122 mo
MDC IDC MSMT LEADCHNL RA PACING THRESHOLD AMPLITUDE: 0.5 V
MDC IDC STAT BRADY AP VP PERCENT: 33 %
MDC IDC STAT BRADY AP VS PERCENT: 0 %

## 2015-06-06 MED ORDER — NEBIVOLOL HCL 10 MG PO TABS: ORAL_TABLET | ORAL | Status: DC

## 2015-06-06 NOTE — Patient Instructions (Signed)
Medication Instructions:  Your physician has recommended you make the following change in your medication:  INCREASE Bystolic to 10mg  daily   Labwork: None   Testing/Procedures: Your physician has requested that you have a lexiscan myoview. For further information please visit [email or web address] Please follow instruction sheet, as given.   Follow-Up: Follow up with Dr.Klein as planned  Any Other Special Instructions Will Be Listed Below (If Applicable).

## 2015-06-06 NOTE — Progress Notes (Signed)
Cardiology Office Note   Date:  06/06/2015   ID:  [name] [name], DOB 1924/01/07, MRN [id number]  PCP:  [name], MD  Cardiologist:  Dr Lorenso Quarry, PA-C   History of Present Illness: [name] [name] is a 82 y.o. female with a history of NICM, CHB w/ MDT PPM 05/14/2015, HTN, COPD/pulm fibrosis, RVOT PVCs. She was discharged 07/06 after an admission for syncope w/ high grade heart block. She had a wound check and device check that was fine.  [name] [name] presents for evaluation of chest pain. She had 2 episodes of chest pain. The first one was Monday morning. It started without exertion. The pain was a pressure and a 5/10. It went through to her back. It was associated with some SOB but no N&V or diaphoresis. She had no palpitations.  She relaxed and drank water and that helped. She walked around and the pain improved. Her symptoms resolved in less than 15 minutes.   She had another episode Wednesday, same type. Those are the only 2 episodes she has ever had.   In general, since she was discharged after her pacemaker, she has been doing well and has had no dyspnea on exertion or none of the smothering feeling that she was having prior to getting her pacemaker.   Past Medical History  Diagnosis Date  . IBS (irritable bowel syndrome)   . Diverticulosis   . Colon polyps   . High cholesterol   . MI (myocardial infarction) ?? probably not 1975    a. 1975 Reported MI;  b. 2011 Cath: nonobs dzs;  c. 2013 Nonischemic Myoview;  d. 12/2012 Echo: EF 60-65%, Gr1 DD.  Marland Kitchen GERD (gastroesophageal reflux disease)   . Hypertension   . Nephrolithiasis   . Bronchiectasis, non-tuberculous     a. Followed by Dr. Delford Field.  Marland Kitchen DVT (deep venous thrombosis)     Resolved, no anticoagulation currently  . 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 Palpitation     a. 2010 Event Monitor: PACs and PVCs  . Pulmonary fibrosis   . First degree AV block     Conduction system  .  Wide-complex tachycardia     a. Followed by Dr. [name], "I think this is VT, but i am not sure QRSd 135 1:1 AV."  . Pulmonary embolism     Following knee surgery with a prior history  . COPD (chronic obstructive pulmonary disease)     past hx with pneumonia  . Stroke     Past Surgical History  Procedure Laterality Date  . Total abdominal hysterectomy    . Appendectomy    . Bladder tack    . Cholecystectomy    . Colon surgery      12inches colectomy s/p diverticulitis  . Tonsillectomy    . Total knee arthroplasty  12/12/2012    Procedure: TOTAL KNEE ARTHROPLASTY;  Surgeon: Harvie Junior, MD;  Location: [facility] OR;  Service: Orthopedics;  Laterality: Right;  right total knee arthroplasty  . Ep implantable device N/A 05/14/2015    Procedure: Pacemaker Implant;  Surgeon: Hillis Range, MD; Medtronic Adapta L model ADDRL 1 (serial number [id number])     . Cardiac catheterization Right 05/14/2015    Procedure: Temporary Pacemaker;  Surgeon: Hillis Range, MD;  Location: [facility] INVASIVE CV LAB;  Service: Cardiovascular;  Laterality: Right;    Current Outpatient Prescriptions  Medication Sig Dispense Refill  . atorvastatin (LIPITOR) 20 MG tablet Take 20 mg  by mouth daily.    . beclomethasone (QVAR) 80 MCG/ACT inhaler Inhale 2 puffs into the lungs 2 (two) times daily as needed (Shortness of Breath).    . D3-50 50000 UNITS capsule Take by mouth once a week.   3  . docusate sodium (COLACE) 100 MG capsule Take 1 capsule (100 mg total) by mouth daily as needed for mild constipation. 10 capsule 0  . hydrochlorothiazide (HYDRODIURIL) 25 MG tablet Take 0.5 tablets (12.5 mg total) by mouth daily. 30 tablet 3  . nebivolol (BYSTOLIC) 10 MG tablet Take one tablet by mouth daily 30 tablet 5  . omeprazole (PRILOSEC) 40 MG capsule Take 1 capsule by mouth daily as needed (heartburn).     Marland Kitchen Respiratory Therapy Supplies (FLUTTER) DEVI by Does not apply route. Exercise lungs three to four (3-4) times a day as needed    .  Tiotropium Bromide Monohydrate (SPIRIVA RESPIMAT) 2.5 MCG/ACT AERS Inhale 2 Inhalers into the lungs daily as needed (Shortness of Breath).    . traMADol (ULTRAM) 50 MG tablet Take 50 mg by mouth every 4 (four) hours as needed (severe Cough).     No current facility-administered medications for this visit.    Allergies:   Amiodarone; Crestor; Darvon; and Prednisone    Social History:  The patient  reports that she has never smoked. She has never used smokeless tobacco. She reports that she does not drink alcohol or use illicit drugs.   Family History:  The patient's family history includes Heart attack in her mother; Heart disease in her father; Other in her son; Stomach cancer in her brother.    ROS:  Please see the history of present illness. All other systems are reviewed and negative.    PHYSICAL EXAM: VS:  BP 140/88 mmHg  Pulse 73  Ht 5\' 6"  (1.676 m)  Wt 150 lb 3.2 oz (68.13 kg)  BMI 24.25 kg/m2 , BMI Body mass index is 24.25 kg/(m^2). GEN: Well nourished, well developed, in no acute distress HEENT: normal Neck: no JVD, carotid bruits, or masses Cardiac: RRR; no murmurs, rubs, or gallops,no edema  Respiratory:  clear to auscultation bilaterally, normal work of breathing GI: soft, nontender, nondistended, + BS MS: no deformity or atrophy Skin: warm and dry, no rash Neuro:  Strength and sensation are intact Psych: euthymic mood, full affect   EKG:  EKG is ordered today. The ekg ordered today demonstrates sinus rhythm with ventricular pacing  Echo: 05/14/2015 - Left ventricle: The cavity size was normal. Wall thickness wasincreased in a pattern of mild LVH. Systolic function was normal. The estimated ejection fraction was in the range of 60% to 65%.Wall motion was normal; there were no regional wall motionabnormalities. The study is not technically sufficient to allowevaluation of LV diastolic function.  Recent Labs: 05/14/2015: ALT 13*; Hemoglobin 13.4; Magnesium 1.9;  Platelets 178; TSH 1.778 05/15/2015: BUN 15; Creatinine, Ser 1.05*; Potassium 4.0; Sodium 140    Lipid Panel No results found for: CHOL, TRIG, HDL, CHOLHDL, VLDL, LDLCALC, LDLDIRECT   Wt Readings from Last 3 Encounters:  06/06/15 150 lb 3.2 oz (68.13 kg)  05/15/15 148 lb 9.4 oz (67.4 kg)  04/30/15 152 lb (68.947 kg)     Other studies Reviewed: Additional studies/ records that were reviewed today include: Previous office visits, op notes, previous echo.  ASSESSMENT AND PLAN:  1.  Chest pain: Her device was interrogated to see if there were any episodes of arrhythmia in conjunction with her chest pain. She is not  able to say exactly what time on Monday morning she had her episode of chest pain. However, on Monday morning just after 5:30 AM, she had a 9 minute episode of accelerated heart rate with an atrial rate of 166 and a ventricular rate of 110. On Wednesday, at approximately 9 AM she had a 6 minute episode of tachycardia with an atrial rate of 178 and a ventricular rate of 95.  Her underlying rhythm is 2-1 heart block and she is ventricular pacing almost 100% of the time.  She has not had any other episodes of chest pain, she denies shortness of breath, lower extremity edema, orthopnea or PND.  To minimize the tachycardia and since she is already ventricular pacing almost 100% of the time, we will increase her by systolic to 10 mg daily.  To further evaluate her chest pain, we will schedule her for a Lexi scan Myoview to be performed as an outpatient.  After that, she will follow-up with Dr. [name].  Current medicines are reviewed at length with the patient today.  The patient does not have concerns regarding medicines.  The following changes have been made:  Increase the by systolic  Labs/ tests ordered today include:  Orders Placed This Encounter  Procedures  . Myocardial Perfusion Imaging  . EKG 12-Lead     Disposition:   FU with Dr. [name]  Signed, Leanna Battles  06/06/2015 5:21 PM    [facility] [address] [place], [place], [place]  [address] Phone: [phone]; Fax: [phone]

## 2015-06-06 NOTE — Progress Notes (Signed)
Add-on Check per Theodore Demark, PA. to check for symptomatic A-tach. Mode switch episodes align w/ dates&times pt stated she had symptoms. Nebivolol addressed. All other device testing results normal. Follow up as planned, ROV w/ SK 09/02/15.

## 2015-06-07 ENCOUNTER — Telehealth (HOSPITAL_COMMUNITY): Payer: Self-pay | Admitting: *Deleted

## 2015-06-07 NOTE — Telephone Encounter (Signed)
Patient given detailed instructions per Myocardial Perfusion Study Information Sheet for test on 06/11/2015 at 12:30. Patient Notified to arrive 15 minutes early, and that it is imperative to arrive on time for appointment to keep from having the test rescheduled. Patient verbalized understanding. Daneil Dolin

## 2015-06-11 ENCOUNTER — Ambulatory Visit (HOSPITAL_COMMUNITY): Payer: Medicare HMO | Attending: Cardiology

## 2015-06-11 VITALS — Ht 66.0 in | Wt 149.0 lb

## 2015-06-11 DIAGNOSIS — R0602 Shortness of breath: Secondary | ICD-10-CM

## 2015-06-11 DIAGNOSIS — G4441 Drug-induced headache, not elsewhere classified, intractable: Secondary | ICD-10-CM

## 2015-06-11 DIAGNOSIS — R079 Chest pain, unspecified: Secondary | ICD-10-CM | POA: Diagnosis not present

## 2015-06-11 LAB — MYOCARDIAL PERFUSION IMAGING
CHL CUP NUCLEAR SSS: 5
LV dias vol: 66 mL
LVSYSVOL: 27 mL
Peak HR: 73 {beats}/min
RATE: 0.34
Rest HR: 64 {beats}/min
SDS: 3
SRS: 2
TID: 0.93

## 2015-06-11 MED ORDER — REGADENOSON 0.4 MG/5ML IV SOLN
0.4000 mg | Freq: Once | INTRAVENOUS | Status: AC
  Administered 2015-06-11: 0.4 mg via INTRAVENOUS

## 2015-06-11 MED ORDER — TECHNETIUM TC 99M SESTAMIBI GENERIC - CARDIOLITE
30.4000 | Freq: Once | INTRAVENOUS | Status: AC | PRN
  Administered 2015-06-11: 30 via INTRAVENOUS

## 2015-06-11 MED ORDER — TECHNETIUM TC 99M SESTAMIBI GENERIC - CARDIOLITE
10.3000 | Freq: Once | INTRAVENOUS | Status: AC | PRN
  Administered 2015-06-11: 10 via INTRAVENOUS

## 2015-06-11 MED ORDER — AMINOPHYLLINE 25 MG/ML IV SOLN
75.0000 mg | Freq: Once | INTRAVENOUS | Status: AC
  Administered 2015-06-11: 75 mg via INTRAVENOUS

## 2015-06-19 ENCOUNTER — Telehealth: Payer: Self-pay | Admitting: Internal Medicine

## 2015-06-19 NOTE — Telephone Encounter (Signed)
Gave patient preliminary results and advised that once read by physician she should be receiving another call related to this test. She thanks me for helping and appreciative of giving her some indication as to what test showed.

## 2015-06-19 NOTE — Telephone Encounter (Signed)
New Message  Pt called back to discuss stress test results. Please call

## 2015-07-02 ENCOUNTER — Ambulatory Visit (INDEPENDENT_AMBULATORY_CARE_PROVIDER_SITE_OTHER): Payer: Medicare HMO | Admitting: Critical Care Medicine

## 2015-07-02 ENCOUNTER — Encounter: Payer: Self-pay | Admitting: Critical Care Medicine

## 2015-07-02 VITALS — BP 142/72 | HR 64 | Temp 97.2°F | Ht 66.0 in | Wt 151.2 lb

## 2015-07-02 DIAGNOSIS — J479 Bronchiectasis, uncomplicated: Secondary | ICD-10-CM

## 2015-07-02 DIAGNOSIS — Z950 Presence of cardiac pacemaker: Secondary | ICD-10-CM

## 2015-07-02 DIAGNOSIS — J432 Centrilobular emphysema: Secondary | ICD-10-CM

## 2015-07-02 MED ORDER — BECLOMETHASONE DIPROPIONATE 80 MCG/ACT IN AERS: 2.0000 | INHALATION_SPRAY | Freq: Two times a day (BID) | RESPIRATORY_TRACT | Status: DC

## 2015-07-02 NOTE — Assessment & Plan Note (Signed)
Nontuberculous bronchiectasis with previous Mycobacterium avium colonization 2013 more recently infection with Pseudomonas March 2016  Overall patient's improved on inhaled medications  Plan  Maintain Qvar daily  Discontinue Spiriva

## 2015-07-02 NOTE — Progress Notes (Signed)
Subjective:    Patient ID: [name] [name], female    DOB: 1924/02/13, 81 y.o.   MRN: [id number]  HPI 07/02/2015 Chief Complaint  Patient presents with  . Follow-up    Since last ov here had pacemaker (July 5th,2016).Sob same with exertion,energy level decreased,cough-dry.C/o across shoulders ache occass.   Pt had a pacemaker placed after syncope. Pt had felt tired and went out.  PPM has helped.  Breathing stable. Cough is minimal.  NO ABX since march. Pt denies any significant sore throat, nasal congestion or excess secretions, fever, chills, sweats, unintended weight loss, pleurtic or exertional chest pain, orthopnea PND, or leg swelling Pt denies any increase in rescue therapy over baseline, denies waking up needing it or having any early am or nocturnal exacerbations of coughing/wheezing/or dyspnea. Pt also denies any obvious fluctuation in symptoms with  weather or environmental change or other alleviating or aggravating factors   Current Medications, Allergies, Complete Past Medical History, Past Surgical History, Family History, and Social History were reviewed in [employer] electronic medical record per todays encounter:  07/02/2015  Review of Systems  Constitutional: Negative.   HENT: Negative.  Negative for ear pain, postnasal drip, rhinorrhea, sinus pressure, sore throat, trouble swallowing and voice change.   Eyes: Negative.   Respiratory: Positive for cough and shortness of breath. Negative for apnea, choking, chest tightness, wheezing and stridor.   Cardiovascular: Negative.  Negative for chest pain, palpitations and leg swelling.  Gastrointestinal: Negative.  Negative for nausea, vomiting, abdominal pain and abdominal distention.  Genitourinary: Negative.   Musculoskeletal: Negative.  Negative for myalgias and arthralgias.  Skin: Negative.  Negative for rash.  Allergic/Immunologic: Negative.  Negative for environmental allergies and food allergies.  Neurological:  Negative.  Negative for dizziness, syncope, weakness and headaches.  Hematological: Negative.  Negative for adenopathy. Does not bruise/bleed easily.  Psychiatric/Behavioral: Negative.  Negative for sleep disturbance and agitation. The patient is not nervous/anxious.        Objective:   Physical Exam Filed Vitals:   07/02/15 1052  BP: 142/72  Pulse: 64  Temp: 97.2 F (36.2 C)  TempSrc: Oral  Height:  (1.676 m)  Weight: 151 lb 3.2 oz (68.584 kg)  SpO2: 96%    Gen: Pleasant, well-nourished, in no distress,  normal affect  ENT: No lesions,  mouth clear,  oropharynx clear, no postnasal drip  Neck: No JVD, no TMG, no carotid bruits  Lungs: No use of accessory muscles, no dullness to percussion, clear without rales or rhonchi  Cardiovascular: RRR, heart sounds normal, no murmur or gallops, no peripheral edema  Abdomen: soft and NT, no HSM,  BS normal  Musculoskeletal: No deformities, no cyanosis or clubbing  Neuro: alert, non focal  Skin: Warm, no lesions or rashes  No results found.        Assessment & Plan:  I personally reviewed all images and lab data in the [facility] system as well as any outside material available during this office visit and agree with the  radiology impressions.   Bronchiectasis, non-tuberculous  Nontuberculous bronchiectasis with previous Mycobacterium avium colonization 2013 more recently infection with Pseudomonas March 2016  Overall patient's improved on inhaled medications  Plan  Maintain Qvar daily  Discontinue Spiriva    Gretchen was seen today for follow-up.  Diagnoses and all orders for this visit:  Pacemaker  Bronchiectasis, non-tuberculous  Other orders -     beclomethasone (QVAR) 80 MCG/ACT inhaler; Inhale 2 puffs into the lungs 2 (two)  times daily.

## 2015-07-02 NOTE — Patient Instructions (Signed)
Stop spiriva Stay on Qvar two puff twice daily Return 4 months

## 2015-07-24 ENCOUNTER — Telehealth: Payer: Self-pay | Admitting: Internal Medicine

## 2015-07-24 NOTE — Telephone Encounter (Signed)
New message     Pt c/o Shortness Of Breath: STAT if SOB developed within the last 24 hours or pt is noticeably SOB on the phone  1. Are you currently SOB (can you hear that pt is SOB on the phone)? Yes  2. How long have you been experiencing SOB? Since yesterday  3. Are you SOB when sitting or when up moving around?Moving around   4. Are you currently experiencing any other symptoms? Pain in back  B/p 135/85 heart rate 83

## 2015-07-24 NOTE — Telephone Encounter (Signed)
Patient reports Chest discomfort and SOB over the past several days, although worsening each day in frequency and intensity. She reports that her BP = 130/80, HR 85 regular. Denies dizziness. Denies swelling. No other complaints reported. Patient states she feels a little weaker but not necessarily weak. Would like to be seen if possible this week. Scheduled pt for OV tomorrow with Carlean Jews, PA, at 1:30 pm. Patient verbalized appreciation and agreement. Informed patient that should she experience worsening sx or new sx/complaints, esp dizziness, faintness or irregular heart rate to please call 911 to get emergent care. Patient verbalized understanding and agreement.

## 2015-07-25 ENCOUNTER — Telehealth: Payer: Self-pay | Admitting: Internal Medicine

## 2015-07-25 ENCOUNTER — Encounter: Payer: Self-pay | Admitting: Physician Assistant

## 2015-07-25 ENCOUNTER — Ambulatory Visit (INDEPENDENT_AMBULATORY_CARE_PROVIDER_SITE_OTHER): Payer: Medicare HMO | Admitting: *Deleted

## 2015-07-25 ENCOUNTER — Ambulatory Visit (INDEPENDENT_AMBULATORY_CARE_PROVIDER_SITE_OTHER): Payer: Medicare HMO | Admitting: Physician Assistant

## 2015-07-25 VITALS — BP 152/88 | HR 72 | Ht 66.0 in | Wt 150.2 lb

## 2015-07-25 DIAGNOSIS — R079 Chest pain, unspecified: Secondary | ICD-10-CM

## 2015-07-25 DIAGNOSIS — Z950 Presence of cardiac pacemaker: Secondary | ICD-10-CM

## 2015-07-25 LAB — CUP PACEART INCLINIC DEVICE CHECK
Battery Impedance: 100 Ohm
Battery Voltage: 2.79 V
Brady Statistic AS VS Percent: 0 %
Date Time Interrogation Session: 20160915135508
Lead Channel Impedance Value: 582 Ohm
Lead Channel Pacing Threshold Amplitude: 0.5 V
Lead Channel Pacing Threshold Pulse Width: 0.4 ms
Lead Channel Pacing Threshold Pulse Width: 0.4 ms
Lead Channel Sensing Intrinsic Amplitude: 11.2 mV
Lead Channel Sensing Intrinsic Amplitude: 2 mV
Lead Channel Setting Pacing Amplitude: 1.625
MDC IDC MSMT BATTERY REMAINING LONGEVITY: 149 mo
MDC IDC MSMT LEADCHNL RA PACING THRESHOLD AMPLITUDE: 0.5 V
MDC IDC MSMT LEADCHNL RV IMPEDANCE VALUE: 743 Ohm
MDC IDC SET LEADCHNL RV PACING AMPLITUDE: 2 V
MDC IDC SET LEADCHNL RV PACING PULSEWIDTH: 0.4 ms
MDC IDC SET LEADCHNL RV SENSING SENSITIVITY: 4 mV
MDC IDC STAT BRADY AP VP PERCENT: 80 %
MDC IDC STAT BRADY AP VS PERCENT: 0 %
MDC IDC STAT BRADY AS VP PERCENT: 20 %

## 2015-07-25 NOTE — Progress Notes (Signed)
Add-on seeing Cline Crock, PA due to CP. Normal device function. Pt stated she was having symptoms during interrogation; uninterrupted EGM was normal ApVp. Thresholds, sensing, impedances consistent with previous measurements. Device programmed to maximize longevity. 9 mode switches, <0.1%, max-A 159bpm, max duration 47min25sec. Most recent Atrial high rate 06/17/15. No high ventricular rates noted. Device programmed at appropriate safety margins. Histogram distribution appropriate for patient activity level. Device programmed to optimize intrinsic conduction. Estimated longevity 12.17yrs. ROV w/ SK 08/22/15.

## 2015-07-25 NOTE — Progress Notes (Signed)
Cardiology Office Note    Date:  07/25/2015   ID:  [name] [name], DOB 1924/07/28, MRN [id number]  PCP:  [name], MD  Cardiologist/Electrophysiologist:  Dr. [name]   History of Present Illness: [name] [name] is a 81 y.o. female with a history of NICM, CHB s/p MDT PPM (05/2015), HTN, COPD/pulm fibrosis, and RVOT PVCs who was added onto my schedule for evaluation of recurrent chest pain.   She was discharged 05/15/15 after an admission for syncope w/ high grade heart block. She had a wound check and device check that was fine. She was seen on 06/06/15 by [name] Demark PA-C for evalation of chest pain. Her device was interrogated to see if there were any episodes of arrhythmia in conjunction with her chest pain. This revealed a 9 minute episode of accelerated heart rate with an atrial rate of 166 and a ventricular rate of 110 and a 6 minute episode of tachycardia with an atrial rate of 178 and a ventricular rate of 95, but neither of these episodes could be correlated with her symptoms. Her Bystolic was increased to 10mg  daily and she was set up for a [employer] which returned normal. EF 55-65%. The patient called our office yesterday for chest discomfort and SOB over the past several days, although worsening each day in frequency and intensity. In the office we had her device interrogated which was unremarkable. She had chest pain during interrogation with no evidence of arrhythmia.   This pain has come and gone. It is burning and pressure in the middle of her back. It lasts only a couple minutes and then goes away. It is associated with SOB but no nausea or diaphoresis. No radiation. Pain is rated 3-4/10. It happens several times per day and woke her up from sleep last night. It is not associated with exertion. She has never had anythign like this before and noticed it since her pacemaker implantation and wonders if it related.   No fevers or chills but has been having some night  sweats. No LE edema, orthopnea or PND. She sometimes get exertional SOB when making the bed.    Studies:   - Echo (05/14/15):  EF 60-65%, mild LVH, no RWMA  - Nuclear (06/11/15): Myocardial perfusion is normal. The study is normal. This is a low risk study. Overall left ventricular systolic function was normal. Nuclear stress EF: 60%. The left ventricular ejection fraction is normal (55-65%). There is no prior study for comparison.  - Carotid US (2013):  Bilaterally no significant internal carotid artery stenosis with antegrade vertebral flow. Left proximal interna carotid is tortuous with increased velocities, however due to lack of plaque formation it does not support stenosis.   Recent Labs/Images:   Recent Labs  05/14/15 0950  05/14/15 1142 05/15/15 0253  NA 138  < >  --  140  K 4.4  < >  --  4.0  BUN 16  < >  --  15  CREATININE 1.00  < > 0.99 1.05*  ALT 13*  --   --   --   HGB 13.6  < > 13.4  --   TSH 1.778  --   --   --   < > = values in this interval not displayed.   Dg Chest 2 View  05/15/2015   CLINICAL DATA:  Post pacemaker insertion  EXAM: CHEST  2 VIEW  COMPARISON:  05/14/2015  FINDINGS: Left subclavian transvenous pacemaker in good position with leads in  the right atrium and right ventricle. No pneumothorax.  COPD. Apical scarring. Right perihilar and right upper lobe scarring unchanged from prior CT. Negative for heart failure. Mild cardiac enlargement. Mild bibasilar scarring.  IMPRESSION: Satisfactory pacemaker placement  COPD and scarring bilaterally.   Electronically Signed   By: Marlan Palau M.D.   On: 05/15/2015 07:48   Ct Head Wo Contrast  05/14/2015   CLINICAL DATA:  Several days of dizziness and not feeling RIGHT, fell onto bed this morning, headache, history MI, hypertension, PE, DVT  EXAM: CT HEAD WITHOUT CONTRAST  TECHNIQUE: Contiguous axial images were obtained from the base of the skull through the vertex without intravenous contrast.  COMPARISON:  03/08/2015   FINDINGS: Generalized atrophy.  Diffuse prominence of the subarachnoid space unchanged, traversed by vessels.  No midline shift or mass effect.  Small vessel chronic ischemic changes of deep cerebral white matter, mild.  LEFT occipital encephalomalacia consistent with old infarct.  No intracranial hemorrhage, mass lesion or evidence acute infarction.  Small amount of chronic fluid/mucous in LEFT sphenoid sinus.  Bones and sinuses otherwise unremarkable.  IMPRESSION: Atrophy with minimal small vessel chronic ischemic changes of deep cerebral white matter.  Old LEFT occipital infarct.  No acute intracranial abnormalities.   Electronically Signed   By: Ulyses Southward M.D.   On: 05/14/2015 10:42   Dg Chest Portable 1 View  05/14/2015   CLINICAL DATA:  Shortness of breath and bradycardia  EXAM: PORTABLE CHEST - 1 VIEW  COMPARISON:  April 30, 2015  FINDINGS: There is stable mild atelectatic change in both mid lung regions, slightly more on the right than on the left. There is no new opacity. Heart is upper normal in size with pulmonary vascularity within normal limits. There is atherosclerotic change in aorta. No adenopathy. No bone lesions.  IMPRESSION: Stable midlung atelectatic change. No new opacity. No change in cardiac silhouette. No pneumothorax.   Electronically Signed   By: Bretta Bang III M.D.   On: 05/14/2015 11:11     Wt Readings from Last 3 Encounters:  07/25/15 150 lb 3.2 oz (68.13 kg)  07/02/15 151 lb 3.2 oz (68.584 kg)  06/11/15 149 lb (67.586 kg)     Past Medical History  Diagnosis Date  . IBS (irritable bowel syndrome)   . Diverticulosis   . Colon polyps   . High cholesterol   . MI (myocardial infarction) ?? probably not 1975    a. 1975 Reported MI;  b. 2011 Cath: nonobs dzs;  c. 2013 Nonischemic Myoview;  d. 12/2012 Echo: EF 60-65%, Gr1 DD.  Marland Kitchen GERD (gastroesophageal reflux disease)   . Hypertension   . Nephrolithiasis   . Bronchiectasis, non-tuberculous     a. Followed by Dr.  Delford Field.  Marland Kitchen DVT (deep venous thrombosis)     Resolved, no anticoagulation currently  . 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 Palpitation     a. 2010 Event Monitor: PACs and PVCs  . Pulmonary fibrosis   . First degree AV block     Conduction system  . Wide-complex tachycardia     a. Followed by Dr. [name], "I think this is VT, but i am not sure QRSd 135 1:1 AV."  . Pulmonary embolism     Following knee surgery with a prior history  . COPD (chronic obstructive pulmonary disease)     past hx with pneumonia  . Stroke   . Pacemaker     July 5,2016    Current Outpatient Prescriptions  Medication Sig Dispense Refill  . atorvastatin (LIPITOR) 20 MG tablet Take 20 mg by mouth daily.    . beclomethasone (QVAR) 80 MCG/ACT inhaler Inhale 2 puffs into the lungs 2 (two) times daily. 1 Inhaler   . D3-50 50000 UNITS capsule Take 50,000 Units by mouth once a week.   3  . docusate sodium (COLACE) 100 MG capsule Take 1 capsule (100 mg total) by mouth daily as needed for mild constipation. 10 capsule 0  . hydrochlorothiazide (HYDRODIURIL) 25 MG tablet Take 0.5 tablets (12.5 mg total) by mouth daily. 30 tablet 3  . nebivolol (BYSTOLIC) 10 MG tablet Take one tablet by mouth daily 30 tablet 5  . omeprazole (PRILOSEC) 40 MG capsule Take 40 mg by mouth daily.    Marland Kitchen Respiratory Therapy Supplies (FLUTTER) DEVI by Does not apply route. Exercise lungs three to four (3-4) times a day as needed    . traMADol (ULTRAM) 50 MG tablet Take 50 mg by mouth every 4 (four) hours as needed (severe Cough).     No current facility-administered medications for this visit.     Allergies:   Amiodarone; Crestor; Darvon; and Prednisone   Social History:  The patient  reports that she has never smoked. She has never used smokeless tobacco. She reports that she does not drink alcohol or use illicit drugs.   Family History:  The patient's family history includes Heart attack in her mother; Heart disease in  her father; Other in her son; Stomach cancer in her brother.   ROS:  Please see the history of present illness.   All other systems reviewed and negative.    PHYSICAL EXAM: VS:  BP 152/88 mmHg  Pulse 72  Ht  (1.676 m)  Wt 150 lb 3.2 oz (68.13 kg)  BMI 24.25 kg/m2 Well nourished, well developed, in no acute distress HEENT: normal Neck: no JVD Cardiac:  normal S1, S2; RRR; no murmur Lungs:  clear to auscultation bilaterally, no wheezing, rhonchi or rales Abd: soft, nontender, no hepatomegaly Ext: no edema Skin: warm and dry Neuro:  CNs 2-12 intact, no focal abnormalities noted  EKG:  HR 72. AV paced.    ASSESSMENT AND PLAN:  [name] [name] is a 81 y.o. female with a history of NICM, CHB s/p MDT PPM (05/2015), HTN, COPD/pulm fibrosis, and RVOT PVCs who was added onto my schedule for evaluation of recurrent chest pain.   Chest/Back pain- low risk for ischemia. Recent negative stress test and device interrogation with no arrhythmias. She had chest pain during interrogation with no evidence of arrhythmia. Will proceed with CTA w/ PE protocol. Lab work will be done today in the office prior to CTA   Disposition:  FU with me on 08/15/15.    Signed, Eustace Pen, PA-C, MHS 07/25/2015 1:59 PM    [facility] [address] [place], [place], [place]  [address] Phone: [phone]; Fax: [phone]

## 2015-07-25 NOTE — Patient Instructions (Addendum)
Medication Instructions:  Your physician recommends that you continue on your current medications as directed. Please refer to the Current Medication list given to you today.   Labwork: None ordered  Testing/Procedures: CT Angio Chest   Follow Up: You have been scheduled to see Cline Crock, PA-C 08-15-15 @ 9:15.

## 2015-07-25 NOTE — Telephone Encounter (Signed)
Erroroneous phone note; disregard

## 2015-07-26 ENCOUNTER — Ambulatory Visit (INDEPENDENT_AMBULATORY_CARE_PROVIDER_SITE_OTHER)
Admission: RE | Admit: 2015-07-26 | Discharge: 2015-07-26 | Disposition: A | Discharge status: Home / Self Care | Payer: Medicare HMO | Source: Ambulatory Visit | Attending: Physician Assistant | Admitting: Physician Assistant

## 2015-07-26 ENCOUNTER — Other Ambulatory Visit: Payer: Self-pay | Admitting: Physician Assistant

## 2015-07-26 DIAGNOSIS — R079 Chest pain, unspecified: Secondary | ICD-10-CM

## 2015-07-26 DIAGNOSIS — R0789 Other chest pain: Secondary | ICD-10-CM

## 2015-07-26 MED ORDER — IOHEXOL 350 MG/ML SOLN
80.0000 mL | Freq: Once | INTRAVENOUS | Status: AC | PRN
  Administered 2015-07-26: 80 mL via INTRAVENOUS

## 2015-07-29 ENCOUNTER — Other Ambulatory Visit: Payer: Self-pay

## 2015-07-29 ENCOUNTER — Ambulatory Visit (HOSPITAL_COMMUNITY): Payer: Medicare HMO | Attending: Cardiology

## 2015-07-29 DIAGNOSIS — I340 Nonrheumatic mitral (valve) insufficiency: Secondary | ICD-10-CM | POA: Insufficient documentation

## 2015-07-29 DIAGNOSIS — R0789 Other chest pain: Secondary | ICD-10-CM

## 2015-07-29 DIAGNOSIS — I10 Essential (primary) hypertension: Secondary | ICD-10-CM | POA: Diagnosis not present

## 2015-07-29 DIAGNOSIS — I071 Rheumatic tricuspid insufficiency: Secondary | ICD-10-CM | POA: Insufficient documentation

## 2015-07-29 DIAGNOSIS — R079 Chest pain, unspecified: Secondary | ICD-10-CM | POA: Diagnosis present

## 2015-08-13 NOTE — Progress Notes (Signed)
[address] [place] [facility] [place], [place]  [address] Phone: [phone] Fax:  [phone]  Date:  08/15/2015   ID:  [name] [name], DOB 04/23/1924, MRN [id number]  PCP:  [name], MD  Cardiologist/Electrophysiologist: Dr. [name]  History of Present Illness: [name] [name] is a 81 y.o. female with a history of NICM, CHB s/p MDT PPM (05/2015), HTN, COPD/pulm fibrosis, and RVOT PVCs who is seen back for follow up of chest pain.   She was discharged 05/15/15 after an admission for syncope w/ high grade heart block. She had a wound check and device check that was fine. She was seen on 06/06/15 by [name] Demark PA-C for evalation of chest pain. Her device was interrogated to see if there were any episodes of arrhythmia in conjunction with her chest pain. This revealed a 9 minute episode of accelerated heart rate with an atrial rate of 166 and a ventricular rate of 110 and a 6 minute episode of tachycardia with an atrial rate of 178 and a ventricular rate of 95, but neither of these episodes could be correlated with her symptoms. Her Bystolic was increased to 10mg  daily and she was set up for a [employer] which returned normal. EF 55-65%. The patient called our office for recurrent chest discomfort and SOB. She was added onto my clinic schedule on 07/25/15. During this visit we had her device interrogated which was unremarkable. She had chest pain during interrogation with no evidence of arrhythmia. She noticed this chest pain since the time of her pacemaker implantation and wondered if it was related. I ordered a chest CT which returned unremarkable for any acute chest process. This was followed by 2D ECHO on 07/29/15 which revealed no evidence of pericardial effusion making microperforation s/p PPM unlikely. I spoke with her on the phone and she was no longer having any chest pain.   Today she presents for follow up. She is feeling better. She still has some burning back pain that follows  activities such as making the bed. It feels better after massage by her husband and sounds musculoskeletal. She thinks it is due to old age. No further chest discomfort. She has continued SOB due to chronic lung issues with pulmonary fibrosis. Otherwise no other complaints. No LE edema, orthopnea, PND, palpitations or dizziness. She is relieved to know that her heart looks okay.   Studies: - Echo (07/29/15): EF 55-60%, no RWMA, G1DD, no pericardial effusion.  - Nuclear (06/11/15): Myocardial perfusion is normal. The study is normal. This is a low risk study. Overall left ventricular systolic function was normal. Nuclear stress EF: 60%. The left ventricular ejection fraction is normal (55-65%). There is no prior study for comparison. - Carotid US (2013): Bilaterally no significant internal carotid artery stenosis with antegrade vertebral flow. Left proximal interna carotid is tortuous with increased velocities, however due to lack of plaque formation it does not support stenosis.   Recent Labs: 05/14/2015: ALT 13*; Hemoglobin 13.4; TSH 1.778 05/15/2015: Creatinine, Ser 1.05*; Potassium 4.0  Wt Readings from Last 3 Encounters:  08/15/15 151 lb 1.9 oz (68.548 kg)  07/25/15 150 lb 3.2 oz (68.13 kg)  07/02/15 151 lb 3.2 oz (68.584 kg)     Past Medical History  Diagnosis Date  . IBS (irritable bowel syndrome)   . Diverticulosis   . Colon polyps   . High cholesterol   . MI (myocardial infarction) ?? probably not 1975    a. 1975 Reported MI;  b.  2011 Cath: nonobs dzs;  c. 2013 Nonischemic Myoview;  d. 12/2012 Echo: EF 60-65%, Gr1 DD.  Marland Kitchen GERD (gastroesophageal reflux disease)   . Hypertension   . Nephrolithiasis   . Bronchiectasis, non-tuberculous (HCC)     a. Followed by Dr. Delford Field.  Marland Kitchen DVT (deep venous thrombosis) (HCC)     Resolved, no anticoagulation currently  . 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 Palpitation     a. 2010 Event Monitor: PACs and PVCs  . Pulmonary  fibrosis (HCC)   . First degree AV block     Conduction system  . Wide-complex tachycardia (HCC)     a. Followed by Dr. [name], "I think this is VT, but i am not sure QRSd 135 1:1 AV."  . Pulmonary embolism (HCC)     Following knee surgery with a prior history  . COPD (chronic obstructive pulmonary disease) (HCC)     past hx with pneumonia  . Stroke (HCC)   . Pacemaker     July 5,2016    Current Outpatient Prescriptions  Medication Sig Dispense Refill  . atorvastatin (LIPITOR) 20 MG tablet Take 20 mg by mouth daily.    . beclomethasone (QVAR) 80 MCG/ACT inhaler Inhale 2 puffs into the lungs 2 (two) times daily. 1 Inhaler   . D3-50 50000 UNITS capsule Take 50,000 Units by mouth once a week.   3  . docusate sodium (COLACE) 100 MG capsule Take 1 capsule (100 mg total) by mouth daily as needed for mild constipation. 10 capsule 0  . hydrochlorothiazide (HYDRODIURIL) 25 MG tablet Take 0.5 tablets (12.5 mg total) by mouth daily. 30 tablet 3  . nebivolol (BYSTOLIC) 10 MG tablet Take one tablet by mouth daily 30 tablet 5  . omeprazole (PRILOSEC) 40 MG capsule Take 40 mg by mouth as needed (reflux).     Marland Kitchen Respiratory Therapy Supplies (FLUTTER) DEVI as directed. Exercise lungs three to four (3-4) times a day as needed for flutter    . traMADol (ULTRAM) 50 MG tablet Take 50 mg by mouth every 4 (four) hours as needed (severe Cough).     No current facility-administered medications for this visit.    Allergies:   Amiodarone; Crestor; Darvon; and Prednisone   Social History:  The patient  reports that she has never smoked. She has never used smokeless tobacco. She reports that she does not drink alcohol or use illicit drugs.   Family History:  The patient's family history includes Heart attack in her mother; Heart disease in her father; Other in her son; Stomach cancer in her brother.   ROS:  Please see the history of present illness.  All other systems reviewed and negative.   PHYSICAL  EXAM: VS:  BP 172/86 mmHg  Pulse 82  Ht  (1.676 m)  Wt 151 lb 1.9 oz (68.548 kg)  BMI 24.40 kg/m2  SpO2 99% Well nourished, well developed, in no acute distress HEENT: normal Neck: no JVD Cardiac:  normal S1, S2; RRR; no murmur Lungs:  clear to auscultation bilaterally, no wheezing, rhonchi or rales Abd: soft, nontender, no hepatomegaly Ext: no edema Skin: warm and dry Neuro:  CNs 2-12 intact, no focal abnormalities noted  EKG:  None.     ASSESSMENT AND PLAN:  [name] [name] is a 81 y.o. female with a history of NICM, CHB s/p MDT PPM (05/2015), HTN, COPD/pulm fibrosis, and RVOT PVCs who is seen back for follow up of chest pain.   Chest pain- Now resolved.  Recent negative nuclear stress test. She continued to complain of chest pain at last appointment. Subsequent CTA chest and 2D ECHO returned normal. No evidence of pericardial effusion making microperforation from recent pacemaker placement very unlikely. Provided reassurance that her heart looks okay.   CHB s/p PPM- she has an appointment with Dr. [name] next week for her pacer check. She will keep this.  NICM- now resolved. EF 55-60% on most recent 2D ECHO.   HTN- BP has been running high at home- up to 190 systolic. Today BP 172/86. Will add low dose of amlodipine 2.5mg  today. She will continued to monitor BPs at home and is scheduled to see Dr [name] next week.   Back pain- She still has some burning back pain that follows activities such as making the bed. It feels better after massage by her husband and sounds musculoskeletal. She will follow up with PCP.   Dispo- she will keep previously scheduled appt with Dr. [name] next week.   Signed, Thereasa Parkin, PA-C  08/15/2015 9:44 AM

## 2015-08-15 ENCOUNTER — Encounter: Payer: Self-pay | Admitting: Physician Assistant

## 2015-08-15 ENCOUNTER — Ambulatory Visit (INDEPENDENT_AMBULATORY_CARE_PROVIDER_SITE_OTHER): Payer: Medicare HMO | Admitting: Physician Assistant

## 2015-08-15 VITALS — BP 172/86 | HR 82 | Ht 66.0 in | Wt 151.1 lb

## 2015-08-15 DIAGNOSIS — R079 Chest pain, unspecified: Secondary | ICD-10-CM

## 2015-08-15 MED ORDER — AMLODIPINE BESYLATE 2.5 MG PO TABS: 2.5000 mg | ORAL_TABLET | Freq: Every day | ORAL | Status: DC

## 2015-08-15 NOTE — Patient Instructions (Signed)
Medication Instructions:   START TAKING AMLODIPINE 2.5 MG ONCE A DY   Labwork:  NONE ORDER TODAY  Testing/Procedures: NONE ORDER TODAY   Follow-Up:  KEEP APPT AS ALREADY SCHEDULED   Any Other Special Instructions Will Be Listed Below (If Applicable).

## 2015-08-21 ENCOUNTER — Encounter: Payer: Self-pay | Admitting: *Deleted

## 2015-08-22 ENCOUNTER — Encounter: Payer: Self-pay | Admitting: Internal Medicine

## 2015-08-22 ENCOUNTER — Other Ambulatory Visit: Payer: Self-pay | Admitting: *Deleted

## 2015-08-22 ENCOUNTER — Ambulatory Visit (INDEPENDENT_AMBULATORY_CARE_PROVIDER_SITE_OTHER): Payer: Medicare HMO | Admitting: Internal Medicine

## 2015-08-22 VITALS — BP 160/92 | HR 71 | Ht 66.0 in | Wt 149.6 lb

## 2015-08-22 DIAGNOSIS — I459 Conduction disorder, unspecified: Secondary | ICD-10-CM | POA: Diagnosis not present

## 2015-08-22 DIAGNOSIS — I442 Atrioventricular block, complete: Secondary | ICD-10-CM | POA: Diagnosis not present

## 2015-08-22 DIAGNOSIS — Z950 Presence of cardiac pacemaker: Secondary | ICD-10-CM

## 2015-08-22 LAB — CUP PACEART INCLINIC DEVICE CHECK
Battery Impedance: 109 Ohm
Battery Remaining Longevity: 149 mo
Battery Voltage: 2.8 V
Brady Statistic AS VP Percent: 16 %
Brady Statistic AS VS Percent: 0 %
Date Time Interrogation Session: 20161013112133
Implantable Lead Implant Date: 20160705
Implantable Lead Implant Date: 20160705
Implantable Lead Location: 753859
Implantable Lead Model: 5076
Implantable Lead Model: 5076
Lead Channel Impedance Value: 573 Ohm
Lead Channel Pacing Threshold Amplitude: 0.5 V
Lead Channel Pacing Threshold Amplitude: 0.625 V
Lead Channel Pacing Threshold Pulse Width: 0.4 ms
Lead Channel Pacing Threshold Pulse Width: 0.4 ms
Lead Channel Sensing Intrinsic Amplitude: 2 mV
Lead Channel Setting Pacing Amplitude: 1.5 V
Lead Channel Setting Sensing Sensitivity: 4 mV
MDC IDC LEAD LOCATION: 753860
MDC IDC MSMT LEADCHNL RA PACING THRESHOLD AMPLITUDE: 0.375 V
MDC IDC MSMT LEADCHNL RA PACING THRESHOLD PULSEWIDTH: 0.4 ms
MDC IDC MSMT LEADCHNL RV IMPEDANCE VALUE: 794 Ohm
MDC IDC MSMT LEADCHNL RV PACING THRESHOLD AMPLITUDE: 0.5 V
MDC IDC MSMT LEADCHNL RV PACING THRESHOLD PULSEWIDTH: 0.4 ms
MDC IDC SET LEADCHNL RV PACING AMPLITUDE: 2 V
MDC IDC SET LEADCHNL RV PACING PULSEWIDTH: 0.4 ms
MDC IDC STAT BRADY AP VP PERCENT: 84 %
MDC IDC STAT BRADY AP VS PERCENT: 0 %
Zone Setting Detection Interval: 400 ms
Zone Setting Detection Interval: 400 ms

## 2015-08-22 MED ORDER — AMLODIPINE BESYLATE 5 MG PO TABS: 5.0000 mg | ORAL_TABLET | Freq: Every day | ORAL | Status: DC

## 2015-08-22 NOTE — Progress Notes (Signed)
Patient Care Team: Olive Bass, MD as PCP - General (Unknown Physician Specialty) Storm Frisk, MD as Attending Physician (Pulmonary Disease)   HPI  [name] [name] is a 81 y.o. female Seen in follow-up for palpitations with documented nonsustained atrial arrhythmias and nonsustained ventricular tachycardia. She has underlying conduction disease with PR interval of 300-400 ms.  She had not been seen over the year until she presented 7/16 to [facility] with weakness and found to have complete heart block. Following bisoprolol washout, it persisted and she underwent dual-chamber pacing.  Since then she has had several evaluations for chest pain; this is described as being interscapular and burning. It lasts minutes. Sometimes she describes it as being provoked by exertion but other times not. She says in a basin seconds but her husband can rub it and make it better making it sounds like minutes. It has not changed appreciably over the last couple of months.  She exercises regularly and notices no change in her discomfort with exercise. Looking back is her impression that probably antedates the pacemaker. Overall exercise tolerance has been much improved since pacemaker implantation     Echo (07/29/15): EF 55-60%, no RWMA, G1DD, no pericardial effusion. Notably also aortic root was normal. 11   Nuclear (06/11/15): Myocardial perfusion is normal. The study is normal. This is a low risk study. Overall left ventricular systolic function was normal. Nuclear stress EF: 60%. The left ventricular ejection fraction is normal (55-65%). There is no prior study for comparison.  Previous cardiac evaluation has included catheterization 2011 without obstructive disease  Records and Results Reviewed As above   Past Medical History  Diagnosis Date  . IBS (irritable bowel syndrome)   . Diverticulosis   . Colon polyps   . High cholesterol   . MI (myocardial infarction) ?? probably not  1975    a. 1975 Reported MI;  b. 2011 Cath: nonobs dzs;  c. 2013 Nonischemic Myoview;  d. 12/2012 Echo: EF 60-65%, Gr1 DD.  Marland Kitchen GERD (gastroesophageal reflux disease)   . Hypertension   . Nephrolithiasis   . Bronchiectasis, non-tuberculous (HCC)     a. Followed by Dr. Delford Field.  Marland Kitchen DVT (deep venous thrombosis) (HCC)     Resolved, no anticoagulation currently  . 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 Palpitation     a. 2010 Event Monitor: PACs and PVCs  . Pulmonary fibrosis (HCC)   . First degree AV block     Conduction system  . Wide-complex tachycardia (HCC)     a. Followed by Dr. [name], "I think this is VT, but i am not sure QRSd 135 1:1 AV."  . Pulmonary embolism (HCC)     Following knee surgery with a prior history  . COPD (chronic obstructive pulmonary disease) (HCC)     past hx with pneumonia  . Stroke (HCC)   . Pacemaker     July 5,2016    Past Surgical History  Procedure Laterality Date  . Total abdominal hysterectomy    . Appendectomy    . Bladder tack    . Cholecystectomy    . Colon surgery      12inches colectomy s/p diverticulitis  . Tonsillectomy    . Total knee arthroplasty  12/12/2012    Procedure: TOTAL KNEE ARTHROPLASTY;  Surgeon: Harvie Junior, MD;  Location: [facility] OR;  Service: Orthopedics;  Laterality: Right;  right total knee arthroplasty  . Ep implantable device N/A 05/14/2015  Procedure: Pacemaker Implant;  Surgeon: Hillis Range, MD; Medtronic Adapta L model ADDRL 1 (serial number [id number])     . Cardiac catheterization Right 05/14/2015    Procedure: Temporary Pacemaker;  Surgeon: Hillis Range, MD;  Location: [facility] INVASIVE CV LAB;  Service: Cardiovascular;  Laterality: Right;    Current Outpatient Prescriptions  Medication Sig Dispense Refill  . amLODipine (NORVASC) 2.5 MG tablet Take 1 tablet (2.5 mg total) by mouth daily. 30 tablet 4  . atorvastatin (LIPITOR) 20 MG tablet Take 20 mg by mouth daily.    . beclomethasone (QVAR) 80 MCG/ACT  inhaler Inhale 2 puffs into the lungs 2 (two) times daily. 1 Inhaler   . D3-50 50000 UNITS capsule Take 50,000 Units by mouth once a week.   3  . docusate sodium (COLACE) 100 MG capsule Take 1 capsule (100 mg total) by mouth daily as needed for mild constipation. 10 capsule 0  . hydrochlorothiazide (HYDRODIURIL) 25 MG tablet Take 0.5 tablets (12.5 mg total) by mouth daily. 30 tablet 3  . nebivolol (BYSTOLIC) 10 MG tablet Take one tablet by mouth daily 30 tablet 5  . omeprazole (PRILOSEC) 40 MG capsule Take 40 mg by mouth as needed (reflux).     Marland Kitchen Respiratory Therapy Supplies (FLUTTER) DEVI as directed. Exercise lungs three to four (3-4) times a day as needed for flutter    . traMADol (ULTRAM) 50 MG tablet Take 50 mg by mouth every 4 (four) hours as needed (severe Cough).     No current facility-administered medications for this visit.    Allergies  Allergen Reactions  . Amiodarone Other (See Comments)    Did not tolerate  . Crestor [Rosuvastatin] Other (See Comments)    Myalgia  . Darvon Other (See Comments)    Drop in  BP  . Prednisone Diarrhea and Other (See Comments)    polyuria      Review of Systems negative except from HPI and PMH  Physical Exam BP 160/92 mmHg  Pulse 71  Ht 5\' 6"  (1.676 m)  Wt 149 lb 9.6 oz (67.858 kg)  BMI 24.16 kg/m2 Well developed and well nourished in no acute distress HENT normal E scleral and icterus clear Neck Supple JVP flat; carotids brisk and full Clear to ausculation  Regular rate and rhythm, no murmurs gallops or rub Soft with active bowel sounds No clubbing cyanosis  Edema Alert and oriented, grossly normal motor and sensory function Skin Warm and Dry  ECG AV pacing   Assessment and  Plan  Complete Heart Block  Sinus node dysfunction\  Pacemaker  Medtronic  The patient's device was interrogated and the information was fully reviewed.  The device was reprogrammed to maximize longevity  Chest pain atypical  Hypertension     Atrial Tachycardia   Blood pressure remains elevated. We will increase her amlodipine from 2.5--5  Atrial tachycardia's been documented by her device; maximal atrial rate is 159. This does not require anticoagulation at this point  Bradycardia is improved following pacer in functional status with it  Chest pain history was reviewed again. I agree that is atypical; the context of a negative Myoview I think no further cardiac evaluation is necessary

## 2015-08-22 NOTE — Patient Instructions (Signed)
Medication Instructions: 1) Increase norvasc (amlodipine) to 5 mg one tablet by mouth once daily  Labwork: - none  Procedures/Testing: - none  Follow-Up: - Remote monitoring is used to monitor your Pacemaker of ICD from home. This monitoring reduces the number of office visits required to check your device to one time per year. It allows [place] to keep an eye on the functioning of your device to ensure it is working properly. You are scheduled for a device check from home on 11/21/15. You may send your transmission at any time that day. If you have a wireless device, the transmission will be sent automatically. After your physician reviews your transmission, you will receive a postcard with your next transmission date.  - Your physician wants you to follow-up in: 9 months (July 2017) with Gypsy Balsam, NP for Dr. [name]. You will receive a reminder letter in the mail two months in advance. If you don't receive a letter, please call our office to schedule the follow-up appointment.  Any Additional Special Instructions Will Be Listed Below (If Applicable). - none

## 2015-09-02 ENCOUNTER — Encounter: Payer: Self-pay | Admitting: Internal Medicine

## 2015-09-02 ENCOUNTER — Encounter: Payer: Medicare HMO | Admitting: Internal Medicine

## 2015-11-18 ENCOUNTER — Observation Stay (HOSPITAL_COMMUNITY)
Admission: EM | Admit: 2015-11-18 | Discharge: 2015-11-19 | Disposition: A | Discharge status: Home / Self Care | Payer: Medicare HMO | Attending: Internal Medicine | Admitting: Internal Medicine

## 2015-11-18 ENCOUNTER — Encounter (HOSPITAL_COMMUNITY): Payer: Self-pay

## 2015-11-18 ENCOUNTER — Emergency Department (HOSPITAL_COMMUNITY): Payer: Medicare HMO

## 2015-11-18 DIAGNOSIS — J449 Chronic obstructive pulmonary disease, unspecified: Secondary | ICD-10-CM | POA: Diagnosis not present

## 2015-11-18 DIAGNOSIS — E7800 Pure hypercholesterolemia, unspecified: Secondary | ICD-10-CM | POA: Insufficient documentation

## 2015-11-18 DIAGNOSIS — R079 Chest pain, unspecified: Principal | ICD-10-CM | POA: Diagnosis present

## 2015-11-18 DIAGNOSIS — I10 Essential (primary) hypertension: Secondary | ICD-10-CM | POA: Insufficient documentation

## 2015-11-18 DIAGNOSIS — Z888 Allergy status to other drugs, medicaments and biological substances status: Secondary | ICD-10-CM | POA: Insufficient documentation

## 2015-11-18 DIAGNOSIS — R000 Tachycardia, unspecified: Secondary | ICD-10-CM | POA: Diagnosis not present

## 2015-11-18 DIAGNOSIS — Z950 Presence of cardiac pacemaker: Secondary | ICD-10-CM | POA: Insufficient documentation

## 2015-11-18 DIAGNOSIS — Z8673 Personal history of transient ischemic attack (TIA), and cerebral infarction without residual deficits: Secondary | ICD-10-CM | POA: Diagnosis not present

## 2015-11-18 DIAGNOSIS — R0602 Shortness of breath: Secondary | ICD-10-CM | POA: Diagnosis not present

## 2015-11-18 DIAGNOSIS — K589 Irritable bowel syndrome without diarrhea: Secondary | ICD-10-CM | POA: Diagnosis not present

## 2015-11-18 DIAGNOSIS — R05 Cough: Secondary | ICD-10-CM | POA: Insufficient documentation

## 2015-11-18 DIAGNOSIS — I2510 Atherosclerotic heart disease of native coronary artery without angina pectoris: Secondary | ICD-10-CM | POA: Insufficient documentation

## 2015-11-18 DIAGNOSIS — Z87442 Personal history of urinary calculi: Secondary | ICD-10-CM | POA: Insufficient documentation

## 2015-11-18 DIAGNOSIS — J8410 Pulmonary fibrosis, unspecified: Secondary | ICD-10-CM | POA: Diagnosis not present

## 2015-11-18 DIAGNOSIS — I442 Atrioventricular block, complete: Secondary | ICD-10-CM | POA: Diagnosis not present

## 2015-11-18 DIAGNOSIS — Z86010 Personal history of colonic polyps: Secondary | ICD-10-CM | POA: Diagnosis not present

## 2015-11-18 DIAGNOSIS — K219 Gastro-esophageal reflux disease without esophagitis: Secondary | ICD-10-CM | POA: Insufficient documentation

## 2015-11-18 DIAGNOSIS — Z86711 Personal history of pulmonary embolism: Secondary | ICD-10-CM | POA: Diagnosis not present

## 2015-11-18 DIAGNOSIS — Z800 Family history of malignant neoplasm of digestive organs: Secondary | ICD-10-CM | POA: Diagnosis not present

## 2015-11-18 HISTORY — DX: Chest pain, unspecified: R07.9

## 2015-11-18 LAB — COMPREHENSIVE METABOLIC PANEL
ALBUMIN: 3.8 g/dL (ref 3.5–5.0)
ALK PHOS: 55 U/L (ref 38–126)
ALT: 14 U/L (ref 14–54)
AST: 26 U/L (ref 15–41)
Anion gap: 10 (ref 5–15)
BUN: 21 mg/dL — ABNORMAL HIGH (ref 6–20)
CALCIUM: 9.5 mg/dL (ref 8.9–10.3)
CHLORIDE: 102 mmol/L (ref 101–111)
CO2: 25 mmol/L (ref 22–32)
CREATININE: 1.16 mg/dL — AB (ref 0.44–1.00)
GFR calc non Af Amer: 40 mL/min — ABNORMAL LOW (ref >60)
GFR, EST AFRICAN AMERICAN: 47 mL/min — AB (ref >60)
GLUCOSE: 109 mg/dL — AB (ref 65–99)
Potassium: 4.3 mmol/L (ref 3.5–5.1)
SODIUM: 137 mmol/L (ref 135–145)
Total Bilirubin: 0.7 mg/dL (ref 0.3–1.2)
Total Protein: 6.7 g/dL (ref 6.5–8.1)

## 2015-11-18 LAB — CBC
HCT: 41 % (ref 36.0–46.0)
HEMOGLOBIN: 13.6 g/dL (ref 12.0–15.0)
MCH: 30.6 pg (ref 26.0–34.0)
MCHC: 33.2 g/dL (ref 30.0–36.0)
MCV: 92.1 fL (ref 78.0–100.0)
PLATELETS: 156 10*3/uL (ref 150–400)
RBC: 4.45 MIL/uL (ref 3.87–5.11)
RDW: 13.8 % (ref 11.5–15.5)
WBC: 7.5 10*3/uL (ref 4.0–10.5)

## 2015-11-18 LAB — PROTIME-INR
INR: 1.12 (ref 0.00–1.49)
Prothrombin Time: 14.6 seconds (ref 11.6–15.2)

## 2015-11-18 LAB — I-STAT TROPONIN, ED
TROPONIN I, POC: 0.02 ng/mL (ref 0.00–0.08)
TROPONIN I, POC: 0.04 ng/mL (ref 0.00–0.08)

## 2015-11-18 LAB — APTT: APTT: 25 s (ref 24–37)

## 2015-11-18 LAB — TROPONIN I: TROPONIN I: 0.03 ng/mL (ref <0.031)

## 2015-11-18 MED ORDER — ENOXAPARIN SODIUM 40 MG/0.4ML ~~LOC~~ SOLN
40.0000 mg | SUBCUTANEOUS | Status: DC
  Administered 2015-11-18: 40 mg via SUBCUTANEOUS
  Filled 2015-11-18: qty 0.4

## 2015-11-18 MED ORDER — ONDANSETRON HCL 4 MG/2ML IJ SOLN: 4.0000 mg | Freq: Four times a day (QID) | INTRAMUSCULAR | Status: DC | PRN

## 2015-11-18 MED ORDER — HYDROCHLOROTHIAZIDE 25 MG PO TABS
12.5000 mg | ORAL_TABLET | Freq: Every day | ORAL | Status: DC
  Administered 2015-11-19: 12.5 mg via ORAL
  Filled 2015-11-18: qty 1

## 2015-11-18 MED ORDER — ACETAMINOPHEN 325 MG PO TABS: 650.0000 mg | ORAL_TABLET | ORAL | Status: DC | PRN

## 2015-11-18 MED ORDER — TRAMADOL HCL 50 MG PO TABS: 50.0000 mg | ORAL_TABLET | ORAL | Status: DC | PRN

## 2015-11-18 MED ORDER — SODIUM CHLORIDE 0.9 % IV SOLN
1000.0000 mL | INTRAVENOUS | Status: DC
  Administered 2015-11-18: 1000 mL via INTRAVENOUS

## 2015-11-18 MED ORDER — IOHEXOL 350 MG/ML SOLN
100.0000 mL | Freq: Once | INTRAVENOUS | Status: AC | PRN
  Administered 2015-11-18: 80 mL via INTRAVENOUS

## 2015-11-18 MED ORDER — AMLODIPINE BESYLATE 5 MG PO TABS
5.0000 mg | ORAL_TABLET | Freq: Every day | ORAL | Status: DC
  Filled 2015-11-18: qty 1

## 2015-11-18 MED ORDER — ATORVASTATIN CALCIUM 20 MG PO TABS
20.0000 mg | ORAL_TABLET | Freq: Every day | ORAL | Status: DC
  Administered 2015-11-19: 20 mg via ORAL
  Filled 2015-11-18: qty 1

## 2015-11-18 MED ORDER — DOCUSATE SODIUM 100 MG PO CAPS: 100.0000 mg | ORAL_CAPSULE | Freq: Every day | ORAL | Status: DC | PRN

## 2015-11-18 MED ORDER — NEBIVOLOL HCL 5 MG PO TABS
10.0000 mg | ORAL_TABLET | Freq: Every day | ORAL | Status: DC
  Administered 2015-11-19: 10 mg via ORAL
  Filled 2015-11-18: qty 2
  Filled 2015-11-18 (×2): qty 1

## 2015-11-18 MED ORDER — PANTOPRAZOLE SODIUM 40 MG PO TBEC
80.0000 mg | DELAYED_RELEASE_TABLET | Freq: Every day | ORAL | Status: DC
  Administered 2015-11-19: 80 mg via ORAL
  Filled 2015-11-18: qty 2

## 2015-11-18 NOTE — H&P (Signed)
[facility] History and Physical  [name] [name] ZOX:[id number] DOB: 1924/02/23 DOA: 11/18/2015  Referring physician: EDP PCP: [name], MD   Chief Complaint: Chest pain   HPI: [name] [name] is a 82 y.o. female with h/o 3rd degree HB, NICM, ? Of MI but probably not in 1975.  Cath in 2011 showed non-obstructive disease, last stress test was August of 2016 and was low risk.  Patient presents to the ED with intermittent episodes of chest pain / back pain for several months.  Today started 40 mins PTA.  Pain felt like she couldn't breath and was severe.  Located in center of chest with radiation to back.  Took nothing for pain when EMS arrived and it resolved spontaneously.  There was associated nausea.  This and other episodes are not associated with any particular activity.  Review of Systems: Systems reviewed.  As above, otherwise negative  Past Medical History  Diagnosis Date  . IBS (irritable bowel syndrome)   . Diverticulosis   . Colon polyps   . High cholesterol   . MI (myocardial infarction) ?? probably not 1975    a. 1975 Reported MI;  b. 2011 Cath: nonobs dzs;  c. 2013 Nonischemic Myoview;  d. 12/2012 Echo: EF 60-65%, Gr1 DD.  Marland Kitchen GERD (gastroesophageal reflux disease)   . Hypertension   . Nephrolithiasis   . Bronchiectasis, non-tuberculous (HCC)     a. Followed by Dr. Delford Field.  Marland Kitchen DVT (deep venous thrombosis) (HCC)     Resolved, no anticoagulation currently  . 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 Palpitation     a. 2010 Event Monitor: PACs and PVCs  . Pulmonary fibrosis (HCC)   . First degree AV block     Conduction system  . Wide-complex tachycardia (HCC)     a. Followed by Dr. [name], "I think this is VT, but i am not sure QRSd 135 1:1 AV."  . Pulmonary embolism (HCC)     Following knee surgery with a prior history  . COPD (chronic obstructive pulmonary disease) (HCC)     past hx with pneumonia  . Stroke (HCC)   . Pacemaker     July  5,2016   Past Surgical History  Procedure Laterality Date  . Total abdominal hysterectomy    . Appendectomy    . Bladder tack    . Cholecystectomy    . Colon surgery      12inches colectomy s/p diverticulitis  . Tonsillectomy    . Total knee arthroplasty  12/12/2012    Procedure: TOTAL KNEE ARTHROPLASTY;  Surgeon: Harvie Junior, MD;  Location: [facility] OR;  Service: Orthopedics;  Laterality: Right;  right total knee arthroplasty  . Ep implantable device N/A 05/14/2015    Procedure: Pacemaker Implant;  Surgeon: Hillis Range, MD; Medtronic Adapta L model ADDRL 1 (serial number [id number])     . Cardiac catheterization Right 05/14/2015    Procedure: Temporary Pacemaker;  Surgeon: Hillis Range, MD;  Location: [facility] INVASIVE CV LAB;  Service: Cardiovascular;  Laterality: Right;   Social History:  reports that she has never smoked. She has never used smokeless tobacco. She reports that she does not drink alcohol or use illicit drugs.  Allergies  Allergen Reactions  . Amiodarone Other (See Comments)    Did not tolerate  . Crestor [Rosuvastatin] Other (See Comments)    Myalgia  . Darvon Other (See Comments)    Drop in  BP  . Prednisone Diarrhea and Other (See Comments)  polyuria    Family History  Problem Relation Age of Onset  . Stomach cancer Brother   . Heart disease Father   . Heart attack Mother   . Other Son     Triple Bypass     Prior to Admission medications   Medication Sig Start Date End Date Taking? Authorizing Provider  amLODipine (NORVASC) 5 MG tablet Take 1 tablet (5 mg total) by mouth daily. 08/22/15  Yes Duke Salvia, MD  atorvastatin (LIPITOR) 20 MG tablet Take 20 mg by mouth daily.   Yes Historical Provider, MD  docusate sodium (COLACE) 100 MG capsule Take 1 capsule (100 mg total) by mouth daily as needed for mild constipation. 05/15/15  Yes Amber Caryl Bis, NP  hydrochlorothiazide (HYDRODIURIL) 25 MG tablet Take 0.5 tablets (12.5 mg total) by mouth daily. 11/27/13  Yes  Ok Anis, NP  nebivolol (BYSTOLIC) 10 MG tablet Take one tablet by mouth daily 06/06/15  Yes Rhonda G Barrett, PA-C  omeprazole (PRILOSEC) 40 MG capsule Take 40 mg by mouth as needed (reflux).    Yes Historical Provider, MD  traMADol (ULTRAM) 50 MG tablet Take 50 mg by mouth every 4 (four) hours as needed (severe Cough).   Yes Historical Provider, MD  beclomethasone (QVAR) 80 MCG/ACT inhaler Inhale 2 puffs into the lungs 2 (two) times daily. Patient not taking: Reported on 11/18/2015 07/02/15   Storm Frisk, MD  Respiratory Therapy Supplies (FLUTTER) DEVI as directed. Exercise lungs three to four (3-4) times a day as needed for flutter    Historical Provider, MD   Physical Exam: Filed Vitals:   11/18/15 1915 11/18/15 2100  BP: 155/74 153/72  Pulse: 61 67  Resp: 17 17    BP 153/72 mmHg  Pulse 67  Resp 17  SpO2 94%  General Appearance:    Alert, oriented, no distress, appears stated age  Head:    Normocephalic, atraumatic  Eyes:    PERRL, EOMI, sclera non-icteric        Nose:   Nares without drainage or epistaxis. Mucosa, turbinates normal  Throat:   Moist mucous membranes. Oropharynx without erythema or exudate.  Neck:   Supple. No carotid bruits.  No thyromegaly.  No lymphadenopathy.   Back:     No CVA tenderness, no spinal tenderness  Lungs:     Clear to auscultation bilaterally, without wheezes, rhonchi or rales  Chest wall:    No tenderness to palpitation  Heart:    Regular rate and rhythm without murmurs, gallops, rubs  Abdomen:     Soft, non-tender, nondistended, normal bowel sounds, no organomegaly  Genitalia:    deferred  Rectal:    deferred  Extremities:   No clubbing, cyanosis or edema.  Pulses:   2+ and symmetric all extremities  Skin:   Skin color, texture, turgor normal, no rashes or lesions  Lymph nodes:   Cervical, supraclavicular, and axillary nodes normal  Neurologic:   CNII-XII intact. Normal strength, sensation and reflexes      throughout     Labs on Admission:  Basic Metabolic Panel:  Recent Labs Lab 11/18/15 1728  NA 137  K 4.3  CL 102  CO2 25  GLUCOSE 109*  BUN 21*  CREATININE 1.16*  CALCIUM 9.5   Liver Function Tests:  Recent Labs Lab 11/18/15 1728  AST 26  ALT 14  ALKPHOS 55  BILITOT 0.7  PROT 6.7  ALBUMIN 3.8   No results for input(s): LIPASE, AMYLASE in the last  168 hours. No results for input(s): AMMONIA in the last 168 hours. CBC:  Recent Labs Lab 11/18/15 1728  WBC 7.5  HGB 13.6  HCT 41.0  MCV 92.1  PLT 156   Cardiac Enzymes: No results for input(s): CKTOTAL, CKMB, CKMBINDEX, TROPONINI in the last 168 hours.  BNP (last 3 results) No results for input(s): PROBNP in the last 8760 hours. CBG: No results for input(s): GLUCAP in the last 168 hours.  Radiological Exams on Admission: Dg Chest Portable 1 View  11/18/2015  CLINICAL DATA:  Chest pain EXAM: PORTABLE CHEST 1 VIEW COMPARISON:  November 13, 2015 FINDINGS: There is scarring in the left base region. Lungs elsewhere clear. Heart is upper normal in size with pulmonary vascularity within normal limits. Pacemaker lead tips are attached to the right atrium and right ventricle. No pneumothorax. No adenopathy. There is atherosclerotic calcification in the aorta. IMPRESSION: Scarring left base. Lungs elsewhere clear. No change in cardiac silhouette. Electronically Signed   By: Bretta Bang III M.D.   On: 11/18/2015 17:47   Ct Angio Chest Aorta W/cm &/or Wo/cm  11/18/2015  CLINICAL DATA:  Chest pain radiating to the back. Cough and shortness of breath. EXAM: CT ANGIOGRAPHY CHEST WITH CONTRAST TECHNIQUE: Multidetector CT imaging of the chest was performed using the standard protocol during bolus administration of intravenous contrast. Multiplanar CT image reconstructions and MIPs were obtained to evaluate the vascular anatomy. CONTRAST:  80mL OMNIPAQUE IOHEXOL 350 MG/ML SOLN COMPARISON:  Radiographs earlier this day. Chest CT PE protocol  07/26/2015 FINDINGS: There are no filling defects within the pulmonary arteries to suggest pulmonary embolus. Tortuous thoracic aorta with atherosclerosis without aneurysm or evident dissection. Conventional branching pattern from the aortic arch. Coronary artery calcifications are seen. No pericardial effusion. No mediastinal or hilar adenopathy. Left-sided pacemaker in place. Chronic bronchiectasis and volume loss involving the medial segment of the right middle lobe, unchanged from prior. Volume loss and bronchiectasis in the paramediastinal lingula also chronic and unchanged. Scarring in the dependent lower lobes is unchanged, including a 5 mm nodule at the left costophrenic angle, image 120. Peripheral reticulonodular opacities in the right and left lungs appear stable. No confluent consolidation. No findings of pulmonary edema. No pleural effusion. Small hiatal hernia. No acute abnormality in the included upper abdomen. There are no acute or suspicious osseous abnormalities. Review of the MIP images confirms the above findings. IMPRESSION: 1. No pulmonary embolus. 2. No acute abnormality. Chronic volume loss and bronchiectasis in the right middle lobe and lingula, unchanged. Stable scarring and chronic reticular nodular opacities. 3. Atherosclerosis including coronary artery calcifications. Electronically Signed   By: Rubye Oaks M.D.   On: 11/18/2015 20:23    EKG: Independently reviewed.  Assessment/Plan Active Problems:   Chest pain with low risk for cardiac etiology   1. Chest pain low risk - 1. Spoke with cards fellow Dr. Benay Pillow 2. Will observe for 3rd enzyme, if negative then may discharge 3. Does not sound like someone that they would cath unless troponin turned positive. 4. Low risk stress test noted in August of last year 5. Discussed plans with patient including the low probability that we would find anything during this obs stay.  Patient agreed with plans. 2. HTN - continue home  meds    Code Status: Full  Family Communication: No family in room Disposition Plan: Admit to obs   Time spent: 50 min  Leani Myron M. [facility] Pager [phone]  If 7AM-7PM, please contact the day team  taking care of the patient [email or web address] Password [facility] 11/18/2015, 9:36 PM

## 2015-11-18 NOTE — ED Provider Notes (Signed)
CSN: [id number]     Arrival date & time 11/18/15  1628 History   First MD Initiated Contact with Patient 11/18/15 1637     Chief Complaint  Patient presents with  . Back Pain  . Chest Pain    HPI Pt has been having pain off and on for several months.  Today the pain started about 40 minutes prior to arrival.  The pain became severe and she felt like she couldn't breathe.  The pain was in the center of the chest , sharp and severe.  It radiated into the back.   She did not want anything for pain when EMS arrived , the pain has mostly resolved.  She did get nauseated.   Past Medical History  Diagnosis Date  . IBS (irritable bowel syndrome)   . Diverticulosis   . Colon polyps   . High cholesterol   . MI (myocardial infarction) ?? probably not 1975    a. 1975 Reported MI;  b. 2011 Cath: nonobs dzs;  c. 2013 Nonischemic Myoview;  d. 12/2012 Echo: EF 60-65%, Gr1 DD.  Marland Kitchen GERD (gastroesophageal reflux disease)   . Hypertension   . Nephrolithiasis   . Bronchiectasis, non-tuberculous (HCC)     a. Followed by Dr. Delford Field.  Marland Kitchen DVT (deep venous thrombosis) (HCC)     Resolved, no anticoagulation currently  . 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 Palpitation     a. 2010 Event Monitor: PACs and PVCs  . Pulmonary fibrosis (HCC)   . First degree AV block     Conduction system  . Wide-complex tachycardia (HCC)     a. Followed by Dr. [name], "I think this is VT, but i am not sure QRSd 135 1:1 AV."  . Pulmonary embolism (HCC)     Following knee surgery with a prior history  . COPD (chronic obstructive pulmonary disease) (HCC)     past hx with pneumonia  . Stroke (HCC)   . Pacemaker     July 5,2016   Past Surgical History  Procedure Laterality Date  . Total abdominal hysterectomy    . Appendectomy    . Bladder tack    . Cholecystectomy    . Colon surgery      12inches colectomy s/p diverticulitis  . Tonsillectomy    . Total knee arthroplasty  12/12/2012    Procedure: TOTAL  KNEE ARTHROPLASTY;  Surgeon: Harvie Junior, MD;  Location: [facility] OR;  Service: Orthopedics;  Laterality: Right;  right total knee arthroplasty  . Ep implantable device N/A 05/14/2015    Procedure: Pacemaker Implant;  Surgeon: Hillis Range, MD; Medtronic Adapta L model ADDRL 1 (serial number [id number])     . Cardiac catheterization Right 05/14/2015    Procedure: Temporary Pacemaker;  Surgeon: Hillis Range, MD;  Location: [facility] INVASIVE CV LAB;  Service: Cardiovascular;  Laterality: Right;   Family History  Problem Relation Age of Onset  . Stomach cancer Brother   . Heart disease Father   . Heart attack Mother   . Other Son     Triple Bypass   Social History  Substance Use Topics  . Smoking status: Never Smoker   . Smokeless tobacco: Never Used  . Alcohol Use: No   OB History    No data available     Review of Systems  Constitutional: Negative for fever.  Respiratory: Positive for cough.        She has seen her doctor this week for a uri illness,  Negative flu and pna workup  Musculoskeletal: Negative for myalgias.  Skin: Negative for rash.  All other systems reviewed and are negative.     Allergies  Amiodarone; Crestor; Darvon; and Prednisone  Home Medications   Prior to Admission medications   Medication Sig Start Date End Date Taking? Authorizing Provider  amLODipine (NORVASC) 5 MG tablet Take 1 tablet (5 mg total) by mouth daily. 08/22/15  Yes Duke Salvia, MD  atorvastatin (LIPITOR) 20 MG tablet Take 20 mg by mouth daily.   Yes Historical Provider, MD  docusate sodium (COLACE) 100 MG capsule Take 1 capsule (100 mg total) by mouth daily as needed for mild constipation. 05/15/15  Yes Amber Caryl Bis, NP  hydrochlorothiazide (HYDRODIURIL) 25 MG tablet Take 0.5 tablets (12.5 mg total) by mouth daily. 11/27/13  Yes Ok Anis, NP  nebivolol (BYSTOLIC) 10 MG tablet Take one tablet by mouth daily 06/06/15  Yes Rhonda G Barrett, PA-C  omeprazole (PRILOSEC) 40 MG capsule Take  40 mg by mouth as needed (reflux).    Yes Historical Provider, MD  traMADol (ULTRAM) 50 MG tablet Take 50 mg by mouth every 4 (four) hours as needed (severe Cough).   Yes Historical Provider, MD  beclomethasone (QVAR) 80 MCG/ACT inhaler Inhale 2 puffs into the lungs 2 (two) times daily. Patient not taking: Reported on 11/18/2015 07/02/15   Storm Frisk, MD  Respiratory Therapy Supplies (FLUTTER) DEVI as directed. Exercise lungs three to four (3-4) times a day as needed for flutter    Historical Provider, MD   BP 155/74 mmHg  Pulse 61  Resp 17  SpO2 96% Physical Exam  Constitutional: She appears well-developed and well-nourished. No distress.  HENT:  Head: Normocephalic and atraumatic.  Right Ear: External ear normal.  Left Ear: External ear normal.  Eyes: Conjunctivae are normal. Right eye exhibits no discharge. Left eye exhibits no discharge. No scleral icterus.  Neck: Neck supple. No tracheal deviation present.  Cardiovascular: Normal rate, regular rhythm and intact distal pulses.   Pulses:      Radial pulses are 2+ on the right side, and 2+ on the left side.       Dorsalis pedis pulses are 2+ on the right side, and 2+ on the left side.  Pulmonary/Chest: Effort normal and breath sounds normal. No stridor. No respiratory distress. She has no wheezes. She has no rales.  Abdominal: Soft. Bowel sounds are normal. She exhibits no distension. There is no tenderness. There is no rebound and no guarding.  Musculoskeletal: She exhibits no edema or tenderness.  Neurological: She is alert. She has normal strength. No cranial nerve deficit (no facial droop, extraocular movements intact, no slurred speech) or sensory deficit. She exhibits normal muscle tone. She displays no seizure activity. Coordination normal.  Skin: Skin is warm and dry. No rash noted.  Psychiatric: She has a normal mood and affect.  Nursing note and vitals reviewed.   ED Course  Procedures (including critical care  time) Labs Review Labs Reviewed  COMPREHENSIVE METABOLIC PANEL - Abnormal; Notable for the following:    Glucose, Bld 109 (*)    BUN 21 (*)    Creatinine, Ser 1.16 (*)    GFR calc non Af Amer 40 (*)    GFR calc Af Amer 47 (*)    All other components within normal limits  APTT  CBC  PROTIME-INR  I-STAT TROPOININ, ED  I-STAT TROPOININ, ED    Imaging Review Dg Chest Portable 1 View  11/18/2015  CLINICAL DATA:  Chest pain EXAM: PORTABLE CHEST 1 VIEW COMPARISON:  November 13, 2015 FINDINGS: There is scarring in the left base region. Lungs elsewhere clear. Heart is upper normal in size with pulmonary vascularity within normal limits. Pacemaker lead tips are attached to the right atrium and right ventricle. No pneumothorax. No adenopathy. There is atherosclerotic calcification in the aorta. IMPRESSION: Scarring left base. Lungs elsewhere clear. No change in cardiac silhouette. Electronically Signed   By: Bretta Bang III M.D.   On: 11/18/2015 17:47   Ct Angio Chest Aorta W/cm &/or Wo/cm  11/18/2015  CLINICAL DATA:  Chest pain radiating to the back. Cough and shortness of breath. EXAM: CT ANGIOGRAPHY CHEST WITH CONTRAST TECHNIQUE: Multidetector CT imaging of the chest was performed using the standard protocol during bolus administration of intravenous contrast. Multiplanar CT image reconstructions and MIPs were obtained to evaluate the vascular anatomy. CONTRAST:  80mL OMNIPAQUE IOHEXOL 350 MG/ML SOLN COMPARISON:  Radiographs earlier this day. Chest CT PE protocol 07/26/2015 FINDINGS: There are no filling defects within the pulmonary arteries to suggest pulmonary embolus. Tortuous thoracic aorta with atherosclerosis without aneurysm or evident dissection. Conventional branching pattern from the aortic arch. Coronary artery calcifications are seen. No pericardial effusion. No mediastinal or hilar adenopathy. Left-sided pacemaker in place. Chronic bronchiectasis and volume loss involving the medial  segment of the right middle lobe, unchanged from prior. Volume loss and bronchiectasis in the paramediastinal lingula also chronic and unchanged. Scarring in the dependent lower lobes is unchanged, including a 5 mm nodule at the left costophrenic angle, image 120. Peripheral reticulonodular opacities in the right and left lungs appear stable. No confluent consolidation. No findings of pulmonary edema. No pleural effusion. Small hiatal hernia. No acute abnormality in the included upper abdomen. There are no acute or suspicious osseous abnormalities. Review of the MIP images confirms the above findings. IMPRESSION: 1. No pulmonary embolus. 2. No acute abnormality. Chronic volume loss and bronchiectasis in the right middle lobe and lingula, unchanged. Stable scarring and chronic reticular nodular opacities. 3. Atherosclerosis including coronary artery calcifications. Electronically Signed   By: Rubye Oaks M.D.   On: 11/18/2015 20:23   I have personally reviewed and evaluated these images and lab results as part of my medical decision-making.   EKG Interpretation   Date/Time:  Monday November 18 2015 16:46:34 EST Ventricular Rate:  61 PR Interval:    QRS Duration: 148 QT Interval:  427 QTC Calculation: 430 R Axis:   -93 Text Interpretation:  Junctional rhythm , probable paced rhythm  Nonspecific IVCD with LAD Inferior infarct, old Anterior infarct, old No  significant change since last tracing Confirmed by Jeramie Scogin  MD-J, Darely Becknell  ([id number]) on 11/18/2015 4:59:11 PM      MDM   Final diagnoses:  Chest pain, unspecified chest pain type    Pt has no known history of CAD however her chest pain symptoms tonight are concerning for possible ACS.  CT angio performed to evaluate for dissection considering her pain history.  No acute findings noted on the CT scan.  Initial labs are reassuring.  Will consult with medical service for admission, serial enzymes, consider cardiology consult.    Linwood Dibbles,  MD 11/18/15 2049

## 2015-11-18 NOTE — ED Notes (Signed)
Per EMS: Pt from home. Began complaining of a "tearing" pain between shoulder blades. Also some chest discomfort. Hx of MI ~40 years ago. Pt states pain "comes and goes." Pt refused pain meds with EMS.

## 2015-11-18 NOTE — Progress Notes (Signed)
Patient arrived on 2 west AOX4 and oriented to the unit, VSS, CCMD was notified by RN that patient is on box 2w04. Patient has no complaints at this time call bell is within reach.  Nikki Dom, RN

## 2015-11-18 NOTE — ED Notes (Signed)
Pt taken to CT.

## 2015-11-18 NOTE — ED Notes (Signed)
Admitting in bedside

## 2015-11-19 DIAGNOSIS — R079 Chest pain, unspecified: Secondary | ICD-10-CM | POA: Diagnosis not present

## 2015-11-19 LAB — TROPONIN I
Troponin I: 0.04 ng/mL — ABNORMAL HIGH (ref <0.031)
Troponin I: 0.04 ng/mL — ABNORMAL HIGH (ref <0.031)

## 2015-11-19 MED ORDER — OMEPRAZOLE 40 MG PO CPDR: 40.0000 mg | DELAYED_RELEASE_CAPSULE | Freq: Every day | ORAL | Status: DC

## 2015-11-19 MED ORDER — ACETAMINOPHEN 325 MG PO TABS: 650.0000 mg | ORAL_TABLET | ORAL | Status: DC | PRN

## 2015-11-19 NOTE — Care Management Obs Status (Signed)
MEDICARE OBSERVATION STATUS NOTIFICATION   Patient Details  Name: [name] [name] MRN: [id number] Date of Birth: Apr 15, 1924   Medicare Observation Status Notification Given:  No (< than 24 hours)    Cherylann Parr, RN 11/19/2015, 10:58 AM

## 2015-11-19 NOTE — Discharge Summary (Signed)
Physician Discharge Summary  [name] [name] ZOX:[id number] DOB: [birth year] DOA: 11/18/2015  PCP: [name], MD  Admit date: 11/18/2015 Discharge date: 11/19/2015  Discharge Diagnoses:  Active Problems:   Chest pain  GERD H/o bronchiectasis, MAC and pulmonary fibrosis  Discharge Condition: stable  Diet recommendation: heart healthy  Filed Weights   11/18/15 2300  Weight: 66.361 kg (146 lb 4.8 oz)    History of present illness:  82 y.o. female with h/o 3rd degree HB, NICM, ? Of MI but probably not in 1975. Cath in 2011 showed non-obstructive disease, last stress test was August of 2016 and was low risk.  Patient presents to the ED with intermittent episodes of chest pain / back pain for several months. Today started 40 mins PTA. Pain felt like she couldn't breath and was severe. Located in center of chest with radiation to back. Took nothing for pain when EMS arrived and it resolved spontaneously. There was associated nausea. This and other episodes are not associated with any particular activity.   Hospital Course:  Admitted to telemetry. troponins ranged 0.03 - 0.4. Has h/o GERD and takes prn omeprazole. May be contributing. Will schedule daily. Stable for discharge and feeling better  Procedures:  none  Consultations:  none  Discharge Exam: Filed Vitals:   11/19/15 0438 11/19/15 0942  BP: 135/72 145/58  Pulse: 72   Temp: 98.4 F (36.9 C)   Resp: 18     General: a and o. comfortable Cardiovascular: RRR Respiratory: CTA  Discharge Instructions   Discharge Instructions    Activity as tolerated - No restrictions    Complete by:  As directed      Diet - low sodium heart healthy    Complete by:  As directed           Current Discharge Medication List    START taking these medications   Details  acetaminophen (TYLENOL) 325 MG tablet Take 2 tablets (650 mg total) by mouth every 4 (four) hours as needed for headache or mild pain.      CONTINUE these  medications which have CHANGED   Details  omeprazole (PRILOSEC) 40 MG capsule Take 1 capsule (40 mg total) by mouth daily. Qty: 30 capsule, Refills: 0      CONTINUE these medications which have NOT CHANGED   Details  amLODipine (NORVASC) 5 MG tablet Take 1 tablet (5 mg total) by mouth daily. Qty: 30 tablet, Refills: 11    atorvastatin (LIPITOR) 20 MG tablet Take 20 mg by mouth daily.    docusate sodium (COLACE) 100 MG capsule Take 1 capsule (100 mg total) by mouth daily as needed for mild constipation. Qty: 10 capsule, Refills: 0    hydrochlorothiazide (HYDRODIURIL) 25 MG tablet Take 0.5 tablets (12.5 mg total) by mouth daily. Qty: 30 tablet, Refills: 3   Associated Diagnoses: CAD (coronary artery disease) of artery bypass graft; HTN (hypertension); Wide-complex tachycardia (HCC)    nebivolol (BYSTOLIC) 10 MG tablet Take one tablet by mouth daily Qty: 30 tablet, Refills: 5    traMADol (ULTRAM) 50 MG tablet Take 50 mg by mouth every 4 (four) hours as needed (severe Cough).    Respiratory Therapy Supplies (FLUTTER) DEVI as directed. Exercise lungs three to four (3-4) times a day as needed for flutter       Allergies  Allergen Reactions  . Amiodarone Other (See Comments)    Did not tolerate  . Crestor [Rosuvastatin] Other (See Comments)    Myalgia  . Darvon Other (See  Comments)    Drop in  BP  . Prednisone Diarrhea and Other (See Comments)    polyuria   Follow-up Information    Follow up with [name], MD.   Specialty:  Family Medicine   Why:  If symptoms worsen   Contact information:   [address] [place] [address] [phone]        The results of significant diagnostics from this hospitalization (including imaging, microbiology, ancillary and laboratory) are listed below for reference.    Significant Diagnostic Studies: Dg Chest Portable 1 View  11/18/2015  CLINICAL DATA:  Chest pain EXAM: PORTABLE CHEST 1 VIEW COMPARISON:  November 13, 2015 FINDINGS:  There is scarring in the left base region. Lungs elsewhere clear. Heart is upper normal in size with pulmonary vascularity within normal limits. Pacemaker lead tips are attached to the right atrium and right ventricle. No pneumothorax. No adenopathy. There is atherosclerotic calcification in the aorta. IMPRESSION: Scarring left base. Lungs elsewhere clear. No change in cardiac silhouette. Electronically Signed   By: Bretta Bang III M.D.   On: 11/18/2015 17:47   Ct Angio Chest Aorta W/cm &/or Wo/cm  11/18/2015  CLINICAL DATA:  Chest pain radiating to the back. Cough and shortness of breath. EXAM: CT ANGIOGRAPHY CHEST WITH CONTRAST TECHNIQUE: Multidetector CT imaging of the chest was performed using the standard protocol during bolus administration of intravenous contrast. Multiplanar CT image reconstructions and MIPs were obtained to evaluate the vascular anatomy. CONTRAST:  80mL OMNIPAQUE IOHEXOL 350 MG/ML SOLN COMPARISON:  Radiographs earlier this day. Chest CT PE protocol 07/26/2015 FINDINGS: There are no filling defects within the pulmonary arteries to suggest pulmonary embolus. Tortuous thoracic aorta with atherosclerosis without aneurysm or evident dissection. Conventional branching pattern from the aortic arch. Coronary artery calcifications are seen. No pericardial effusion. No mediastinal or hilar adenopathy. Left-sided pacemaker in place. Chronic bronchiectasis and volume loss involving the medial segment of the right middle lobe, unchanged from prior. Volume loss and bronchiectasis in the paramediastinal lingula also chronic and unchanged. Scarring in the dependent lower lobes is unchanged, including a 5 mm nodule at the left costophrenic angle, image 120. Peripheral reticulonodular opacities in the right and left lungs appear stable. No confluent consolidation. No findings of pulmonary edema. No pleural effusion. Small hiatal hernia. No acute abnormality in the included upper abdomen. There are no  acute or suspicious osseous abnormalities. Review of the MIP images confirms the above findings. IMPRESSION: 1. No pulmonary embolus. 2. No acute abnormality. Chronic volume loss and bronchiectasis in the right middle lobe and lingula, unchanged. Stable scarring and chronic reticular nodular opacities. 3. Atherosclerosis including coronary artery calcifications. Electronically Signed   By: Rubye Oaks M.D.   On: 11/18/2015 20:23    Microbiology: No results found for this or any previous visit (from the past 240 hour(s)).   Labs: Basic Metabolic Panel:  Recent Labs Lab 11/18/15 1728  NA 137  K 4.3  CL 102  CO2 25  GLUCOSE 109*  BUN 21*  CREATININE 1.16*  CALCIUM 9.5   Liver Function Tests:  Recent Labs Lab 11/18/15 1728  AST 26  ALT 14  ALKPHOS 55  BILITOT 0.7  PROT 6.7  ALBUMIN 3.8   No results for input(s): LIPASE, AMYLASE in the last 168 hours. No results for input(s): AMMONIA in the last 168 hours. CBC:  Recent Labs Lab 11/18/15 1728  WBC 7.5  HGB 13.6  HCT 41.0  MCV 92.1  PLT 156   Cardiac Enzymes:  Recent Labs Lab 11/18/15 2227 11/19/15 0034 11/19/15 0315  TROPONINI 0.03 0.04* 0.04*   BNP: BNP (last 3 results) No results for input(s): BNP in the last 8760 hours.  ProBNP (last 3 results) No results for input(s): PROBNP in the last 8760 hours.  CBG: No results for input(s): GLUCAP in the last 168 hours.  EKG: paced   Signed:  Christiane Ha MD [facility] 11/19/2015, 10:13 AM

## 2015-11-19 NOTE — Progress Notes (Signed)
Patient discharged home with family. Medications and instructions were given and the patient and family stated that they understood. IV was dc'd and was intact.

## 2015-11-21 ENCOUNTER — Telehealth: Payer: Self-pay | Admitting: Internal Medicine

## 2015-11-21 ENCOUNTER — Ambulatory Visit (INDEPENDENT_AMBULATORY_CARE_PROVIDER_SITE_OTHER): Payer: Medicare HMO | Admitting: *Deleted

## 2015-11-21 ENCOUNTER — Telehealth: Payer: Self-pay | Admitting: Cardiology

## 2015-11-21 DIAGNOSIS — I442 Atrioventricular block, complete: Secondary | ICD-10-CM

## 2015-11-21 NOTE — Telephone Encounter (Signed)
Spoke with pt and reminded pt of remote transmission that is due today. Pt verbalized understanding, pt also stated that she is feeling faint and short of breath. I instructed her to call her PCP or primary cardiologist b/c of this issue. Pt verbalized understanding.

## 2015-11-21 NOTE — Telephone Encounter (Signed)
Spoke with pt. She states was called this AM  by the device clinic because today is scheduled for remote pacemaker check. Pt states that she C/O to the person in the device clinic that she still feeling  Weak. So the nurse recommends to call her PCP and or her cardiologist. Pt was in [place] on 11/19/15 due to SOB and chest discomfort. When in the ER pt was checked and was tolled that her heart was fine that she   probable  had indigestion. Pt states she is getting over a head cold and she is feeling better now. Pt has an appointment with her PCP next week. Pt was encouraged to keep that appointment.

## 2015-11-21 NOTE — Telephone Encounter (Signed)
Pt c/o Shortness Of Breath: STAT if SOB developed within the last 24 hours or pt is noticeably SOB on the phone  1. Are you currently SOB (can you hear that pt is SOB on the phone)? Yes  2. How long have you been experiencing SOB? 1wk  3. Are you SOB when sitting or when up moving around? both  4. Are you currently experiencing any other symptoms? weaking

## 2015-11-21 NOTE — Progress Notes (Signed)
Remote pacemaker transmission.   

## 2015-11-26 ENCOUNTER — Ambulatory Visit: Payer: Medicare HMO | Admitting: Pulmonary Disease

## 2015-11-29 LAB — CUP PACEART REMOTE DEVICE CHECK
Battery Impedance: 109 Ohm
Battery Remaining Longevity: 148 mo
Battery Voltage: 2.79 V
Brady Statistic AP VP Percent: 80 %
Brady Statistic AP VS Percent: 0 %
Brady Statistic AS VP Percent: 19 %
Brady Statistic AS VS Percent: 0 %
Date Time Interrogation Session: 20170112153052
Implantable Lead Implant Date: 20160705
Implantable Lead Implant Date: 20160705
Implantable Lead Location: 753859
Implantable Lead Location: 753860
Implantable Lead Model: 5076
Implantable Lead Model: 5076
Lead Channel Impedance Value: 548 Ohm
Lead Channel Impedance Value: 828 Ohm
Lead Channel Setting Pacing Amplitude: 1.5 V
Lead Channel Setting Pacing Amplitude: 2 V
Lead Channel Setting Pacing Pulse Width: 0.4 ms
Lead Channel Setting Sensing Sensitivity: 4 mV

## 2015-12-06 ENCOUNTER — Encounter: Payer: Self-pay | Admitting: Cardiology

## 2016-01-15 DIAGNOSIS — N183 Chronic kidney disease, stage 3 unspecified: Secondary | ICD-10-CM

## 2016-01-15 DIAGNOSIS — I10 Essential (primary) hypertension: Secondary | ICD-10-CM

## 2016-01-15 DIAGNOSIS — J431 Panlobular emphysema: Secondary | ICD-10-CM

## 2016-01-15 DIAGNOSIS — I119 Hypertensive heart disease without heart failure: Secondary | ICD-10-CM | POA: Insufficient documentation

## 2016-01-15 DIAGNOSIS — I471 Supraventricular tachycardia, unspecified: Secondary | ICD-10-CM

## 2016-01-15 DIAGNOSIS — I442 Atrioventricular block, complete: Secondary | ICD-10-CM | POA: Insufficient documentation

## 2016-01-15 HISTORY — DX: Supraventricular tachycardia, unspecified: I47.10

## 2016-01-15 HISTORY — DX: Chronic kidney disease, stage 3 unspecified: N18.30

## 2016-01-15 HISTORY — DX: Panlobular emphysema: J43.1

## 2016-01-15 HISTORY — DX: Atrioventricular block, complete: I44.2

## 2016-01-15 HISTORY — DX: Essential (primary) hypertension: I10

## 2016-01-15 HISTORY — DX: Supraventricular tachycardia: I47.1

## 2016-01-16 DIAGNOSIS — I639 Cerebral infarction, unspecified: Secondary | ICD-10-CM | POA: Insufficient documentation

## 2016-01-16 DIAGNOSIS — Z950 Presence of cardiac pacemaker: Secondary | ICD-10-CM

## 2016-01-16 HISTORY — DX: Presence of cardiac pacemaker: Z95.0

## 2016-02-07 DIAGNOSIS — I63311 Cerebral infarction due to thrombosis of right middle cerebral artery: Secondary | ICD-10-CM

## 2016-02-07 HISTORY — DX: Cerebral infarction due to thrombosis of right middle cerebral artery: I63.311

## 2016-02-08 DIAGNOSIS — I4891 Unspecified atrial fibrillation: Secondary | ICD-10-CM

## 2016-02-08 DIAGNOSIS — I480 Paroxysmal atrial fibrillation: Secondary | ICD-10-CM | POA: Insufficient documentation

## 2016-02-08 HISTORY — DX: Unspecified atrial fibrillation: I48.91

## 2016-02-20 ENCOUNTER — Encounter: Payer: Medicare HMO | Admitting: *Deleted

## 2016-02-24 ENCOUNTER — Encounter: Payer: Self-pay | Admitting: Cardiology

## 2016-03-03 ENCOUNTER — Telehealth: Payer: Self-pay | Admitting: Internal Medicine

## 2016-03-03 ENCOUNTER — Ambulatory Visit (INDEPENDENT_AMBULATORY_CARE_PROVIDER_SITE_OTHER): Payer: Medicare HMO | Admitting: *Deleted

## 2016-03-03 DIAGNOSIS — Z950 Presence of cardiac pacemaker: Secondary | ICD-10-CM

## 2016-03-03 DIAGNOSIS — I442 Atrioventricular block, complete: Secondary | ICD-10-CM

## 2016-03-03 NOTE — Telephone Encounter (Signed)
Spoke w/ pt daughter in law and attempted to help her trouble shoot home monitor. After several unsuccessful attempts instructed pt to call tech services. Pt daughter in law verbalized understanding.

## 2016-03-03 NOTE — Telephone Encounter (Signed)
New Message  Pt calling to speak w/ Device on how to send in remote transmission- pt has been in hosp- unable to send  Until now. Please call back and discuss.

## 2016-03-03 NOTE — Progress Notes (Signed)
Remote pacemaker transmission.   

## 2016-04-14 LAB — CUP PACEART REMOTE DEVICE CHECK
Battery Voltage: 2.78 V
Brady Statistic AS VP Percent: 24 %
Implantable Lead Implant Date: 20160705
Implantable Lead Location: 753860
Implantable Lead Model: 5076
Lead Channel Impedance Value: 825 Ohm
Lead Channel Pacing Threshold Pulse Width: 0.4 ms
Lead Channel Setting Pacing Amplitude: 1.5 V
Lead Channel Setting Pacing Pulse Width: 0.4 ms
Lead Channel Setting Sensing Sensitivity: 4 mV
MDC IDC LEAD IMPLANT DT: 20160705
MDC IDC LEAD LOCATION: 753859
MDC IDC MSMT BATTERY IMPEDANCE: 109 Ohm
MDC IDC MSMT BATTERY REMAINING LONGEVITY: 149 mo
MDC IDC MSMT LEADCHNL RA IMPEDANCE VALUE: 540 Ohm
MDC IDC MSMT LEADCHNL RA PACING THRESHOLD AMPLITUDE: 0.375 V
MDC IDC MSMT LEADCHNL RA PACING THRESHOLD PULSEWIDTH: 0.4 ms
MDC IDC MSMT LEADCHNL RA SENSING INTR AMPL: 1.4 mV
MDC IDC MSMT LEADCHNL RV PACING THRESHOLD AMPLITUDE: 0.625 V
MDC IDC SESS DTM: 20170425132110
MDC IDC SET LEADCHNL RV PACING AMPLITUDE: 2 V
MDC IDC STAT BRADY AP VP PERCENT: 76 %
MDC IDC STAT BRADY AP VS PERCENT: 0 %
MDC IDC STAT BRADY AS VS PERCENT: 0 %

## 2016-04-15 ENCOUNTER — Encounter: Payer: Self-pay | Admitting: Cardiology

## 2016-04-16 ENCOUNTER — Ambulatory Visit (INDEPENDENT_AMBULATORY_CARE_PROVIDER_SITE_OTHER): Payer: Medicare HMO | Admitting: Nurse Practitioner

## 2016-04-16 ENCOUNTER — Encounter: Payer: Self-pay | Admitting: Nurse Practitioner

## 2016-04-16 ENCOUNTER — Encounter: Payer: Self-pay | Admitting: Internal Medicine

## 2016-04-16 VITALS — BP 152/82 | HR 70 | Ht 66.0 in | Wt 149.4 lb

## 2016-04-16 DIAGNOSIS — I442 Atrioventricular block, complete: Secondary | ICD-10-CM

## 2016-04-16 DIAGNOSIS — I10 Essential (primary) hypertension: Secondary | ICD-10-CM

## 2016-04-16 DIAGNOSIS — I4892 Unspecified atrial flutter: Secondary | ICD-10-CM | POA: Diagnosis not present

## 2016-04-16 DIAGNOSIS — R0789 Other chest pain: Secondary | ICD-10-CM | POA: Diagnosis not present

## 2016-04-16 LAB — CUP PACEART INCLINIC DEVICE CHECK
Battery Impedance: 133 Ohm
Brady Statistic AP VP Percent: 69 %
Brady Statistic AP VS Percent: 0 %
Brady Statistic AS VS Percent: 0 %
Date Time Interrogation Session: 20170608170548
Implantable Lead Implant Date: 20160705
Implantable Lead Location: 753859
Implantable Lead Location: 753860
Lead Channel Impedance Value: 530 Ohm
Lead Channel Pacing Threshold Amplitude: 0.375 V
Lead Channel Pacing Threshold Amplitude: 0.75 V
Lead Channel Pacing Threshold Pulse Width: 0.4 ms
Lead Channel Pacing Threshold Pulse Width: 0.4 ms
Lead Channel Setting Pacing Amplitude: 1.5 V
Lead Channel Setting Pacing Pulse Width: 0.4 ms
MDC IDC LEAD IMPLANT DT: 20160705
MDC IDC MSMT BATTERY REMAINING LONGEVITY: 143 mo
MDC IDC MSMT BATTERY VOLTAGE: 2.78 V
MDC IDC MSMT LEADCHNL RA PACING THRESHOLD AMPLITUDE: 0.5 V
MDC IDC MSMT LEADCHNL RA PACING THRESHOLD PULSEWIDTH: 0.4 ms
MDC IDC MSMT LEADCHNL RA SENSING INTR AMPL: 2 mV
MDC IDC MSMT LEADCHNL RV IMPEDANCE VALUE: 801 Ohm
MDC IDC MSMT LEADCHNL RV PACING THRESHOLD AMPLITUDE: 0.625 V
MDC IDC MSMT LEADCHNL RV PACING THRESHOLD PULSEWIDTH: 0.4 ms
MDC IDC SET LEADCHNL RV PACING AMPLITUDE: 2 V
MDC IDC SET LEADCHNL RV SENSING SENSITIVITY: 4 mV
MDC IDC STAT BRADY AS VP PERCENT: 31 %

## 2016-04-16 NOTE — Patient Instructions (Signed)
Medication Instructions:   Your physician recommends that you continue on your current medications as directed. Please refer to the Current Medication list given to you today.    If you need a refill on your cardiac medications before your next appointment, please call your pharmacy.  Labwork:  NONE ORDER TODAY    Testing/Procedures:   Follow-Up: 3 MONTHS WITH DR [name]    Remote monitoring is used to monitor your Pacemaker of ICD from home. This monitoring reduces the number of office visits required to check your device to one time per year. It allows [place] to keep an eye on the functioning of your device to ensure it is working properly. You are scheduled for a device check from home on . 07/16/2016..You may send your transmission at any time that day. If you have a wireless device, the transmission will be sent automatically. After your physician reviews your transmission, you will receive a postcard with your next transmission date.     Any Other Special Instructions Will Be Listed Below (If Applicable).

## 2016-04-16 NOTE — Progress Notes (Signed)
Electrophysiology Office Note Date: 04/16/2016  ID:  [name] [name], DOB [birth year], MRN [id number]  PCP: [name], MD Electrophysiologist: [name]  CC: Pacemaker follow-up  [name] [name] is a 82 y.o. female seen today for Dr [name].  She presents today for routine electrophysiology followup.  Since last being seen in our clinic, the patient reports doing relatively well.  She had a stroke in March of this year and was seen at [facility].  According to paperwork she brought with her, stroke was acute occlusion of right middle cerebral artery. She is recovering well and participating in rehab.  She is currently on ASA for stroke prevention.  She denies chest pain, dyspnea, PND, orthopnea, nausea, vomiting, dizziness, syncope, edema, weight gain, or early satiety.  Device History: MDT dual chamber PPM implanted 2016 for complete heart block    Past Medical History  Diagnosis Date  . IBS (irritable bowel syndrome)   . Diverticulosis   . Colon polyps   . High cholesterol   . MI (myocardial infarction) ?? probably not 1975    a. 1975 Reported MI;  b. 2011 Cath: nonobs dzs;  c. 2013 Nonischemic Myoview;  d. 12/2012 Echo: EF 60-65%, Gr1 DD.  Marland Kitchen GERD (gastroesophageal reflux disease)   . Hypertension   . Nephrolithiasis   . Bronchiectasis, non-tuberculous (HCC)     a. Followed by Dr. Delford Field.  Marland Kitchen DVT (deep venous thrombosis) (HCC)     Resolved, no anticoagulation currently  . 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 Palpitation     a. 2010 Event Monitor: PACs and PVCs  . Pulmonary fibrosis (HCC)   . Wide-complex tachycardia (HCC)     a. Followed by Dr. [name], "I think this is VT, but i am not sure QRSd 135 1:1 AV."  . COPD (chronic obstructive pulmonary disease) (HCC)     past hx with pneumonia  . Stroke (HCC)   . Complete heart block (HCC)     a. s/p MDT dual chamber PPM    Past Surgical History  Procedure Laterality Date  . Total abdominal hysterectomy    .  Appendectomy    . Bladder tack    . Cholecystectomy    . Colon surgery      12inches colectomy s/p diverticulitis  . Tonsillectomy    . Total knee arthroplasty  12/12/2012    Procedure: TOTAL KNEE ARTHROPLASTY;  Surgeon: Harvie Junior, MD;  Location: [facility] OR;  Service: Orthopedics;  Laterality: Right;  right total knee arthroplasty  . Ep implantable device N/A 05/14/2015    Procedure: Pacemaker Implant;  Surgeon: Hillis Range, MD; Medtronic Adapta L model ADDRL 1 (serial number [id number])     . Cardiac catheterization Right 05/14/2015    Procedure: Temporary Pacemaker;  Surgeon: Hillis Range, MD;  Location: [facility] INVASIVE CV LAB;  Service: Cardiovascular;  Laterality: Right;    Current Outpatient Prescriptions  Medication Sig Dispense Refill  . aspirin 81 MG chewable tablet Chew 81 mg by mouth daily. TAKE FOUR TABLETS IN AM    . ezetimibe (ZETIA) 10 MG tablet Take 10 mg by mouth daily.    . Omega-3 Fatty Acids (FISH OIL) 1000 MG CAPS Take 1,000 mg by mouth 2 (two) times daily.    . vitamin B-12 (CYANOCOBALAMIN) 1000 MCG tablet Take 1,000 mcg by mouth daily.    . carvedilol (COREG) 12.5 MG tablet Take 12.5 mg by mouth 2 (two) times daily.  0  . Respiratory Therapy Supplies (FLUTTER) DEVI  as directed. Exercise lungs three to four (3-4) times a day as needed for flutter    . Vitamin D, Cholecalciferol, 1000 units CAPS Take 1,000 Units by mouth daily.     No current facility-administered medications for this visit.    Allergies:   Amiodarone; Crestor; Darvon; Meperidine; Prednisone; Valsartan; Meloxicam; Tramadol; Aspirin-acetaminophen-caffeine; and Propoxyphene   Social History: Social History   Social History  . Marital Status: Married    Spouse Name: N/A  . Number of Children: 3  . Years of Education: N/A   Occupational History  . Retired     [employer]   Social History Main Topics  . Smoking status: Never Smoker   . Smokeless tobacco: Never Used  . Alcohol Use: No  .  Drug Use: No  . Sexual Activity: Not on file   Other Topics Concern  . Not on file   Social History Narrative    Family History: Family History  Problem Relation Age of Onset  . Stomach cancer Brother   . Heart disease Father   . Heart attack Mother   . Other Son     Triple Bypass     Review of Systems: All other systems reviewed and are otherwise negative except as noted above.   Physical Exam: VS:  BP 152/82 mmHg  Pulse 70  Ht  (1.676 m)  Wt 149 lb 6.4 oz (67.767 kg)  BMI 24.13 kg/m2 , BMI Body mass index is 24.13 kg/(m^2).  GEN- The patient is elderly appearing, alert and oriented x 3 today.   HEENT: normocephalic, atraumatic; sclera clear, conjunctiva pink; hearing intact; oropharynx clear; neck supple  Lungs- Clear to ausculation bilaterally, normal work of breathing.  No wheezes, rales, rhonchi Heart- Regular rate and rhythm  GI- soft, non-tender, non-distended, bowel sounds present  Extremities- no clubbing, cyanosis, or edema; DP/PT/radial pulses 2+ bilaterally MS- no significant deformity or atrophy Skin- warm and dry, no rash or lesion; PPM pocket well healed Psych- euthymic mood, full affect Neuro- strength and sensation are intact  PPM Interrogation- reviewed in detail today,  See PACEART report  EKG:  EKG is not ordered today.  Recent Labs: 05/14/2015: Magnesium 1.9; TSH 1.778 11/18/2015: ALT 14; BUN 21*; Creatinine, Ser 1.16*; Hemoglobin 13.6; Platelets 156; Potassium 4.3; Sodium 137   Wt Readings from Last 3 Encounters:  04/16/16 149 lb 6.4 oz (67.767 kg)  11/18/15 146 lb 4.8 oz (66.361 kg)  08/22/15 149 lb 9.6 oz (67.858 kg)     Other studies Reviewed: Additional studies/ records that were reviewed today include: Dr [name] office notes   Assessment and Plan:  1.  Complete heart block Normal PPM function See Pace Art report No changes today  2.  HTN Above goal Being followed by PCP and neurology   3.  Atypical chest  pain Longstanding Myoview 06/2015 demonstrated normal EF, no infarct or ischemia  3.  Atrial arrhythmias Mode switches on device interrogation today are most frequently SR w PACs but she does have an episode of probable atrial flutter with duration of 15 minutes.  This is typically not enough to justify anticoagulation, but with recent CVA will send copy of today's note to PCP as well as neurologist.    Current medicines are reviewed at length with the patient today.   The patient does not have concerns regarding her medicines.  The following changes were made today:  none  Labs/ tests ordered today include: none  No orders of the defined types  were placed in this encounter.     Disposition:   Follow up with Dr [name] in 3 months   Signed, Gypsy Balsam, NP 04/16/2016 1:47 PM  [facility] [address] [place] [address] [phone] (office) [phone] (fax)

## 2016-05-28 ENCOUNTER — Telehealth: Payer: Self-pay | Admitting: Internal Medicine

## 2016-05-28 NOTE — Telephone Encounter (Signed)
I called the patient back to make sure there was no other concerns she had today that I could help her with today. She states that her questions were answered by Lanora Manis and she appreciated the call back. She will call back with any further questions or concerns.

## 2016-05-28 NOTE — Telephone Encounter (Signed)
Follow-up     The pt states her pacemaker is moving    Pt c/o BP issue: STAT if pt c/o blurred vision, one-sided weakness or slurred speech  1. What are your last 5 BP readings? 122/45 this am, yesterday 100/something the pt can not remember  2. Are you having any other symptoms (ex. Dizziness, headache, blurred vision, passed out)? Pt is just weak  3. What is your BP issue? The pt feels like it is the Production designer, theatre/television/film, the pt is feeling pain there

## 2016-05-28 NOTE — Telephone Encounter (Signed)
Will forward to [facility] for patient's complaints regarding PPM.

## 2016-05-28 NOTE — Telephone Encounter (Signed)
Follow-up     The pt is calling back concerned still about the blood pressure

## 2016-05-28 NOTE — Telephone Encounter (Signed)
Called pt back, pt stated that when she would lay the pacemaker would shift. Informed pt that this was normal for the ppm to move with certain changing of positions. Pt stated that pain could be caused by a stroke she had in March. Informed pt that she has an apt 07/30/2016. Pt voiced understanding. Pt encouraged to call back if she has any more questions.

## 2016-05-28 NOTE — Telephone Encounter (Signed)
New message      The pt is wanting to speak with a nurse regarding a appointment for today no other information given the pt just wants to speak with a nurse

## 2016-07-15 ENCOUNTER — Encounter: Payer: Self-pay | Admitting: Internal Medicine

## 2016-07-30 ENCOUNTER — Ambulatory Visit (INDEPENDENT_AMBULATORY_CARE_PROVIDER_SITE_OTHER): Payer: Medicare HMO | Admitting: Internal Medicine

## 2016-07-30 ENCOUNTER — Encounter: Payer: Self-pay | Admitting: Internal Medicine

## 2016-07-30 ENCOUNTER — Encounter (INDEPENDENT_AMBULATORY_CARE_PROVIDER_SITE_OTHER): Payer: Self-pay

## 2016-07-30 VITALS — BP 140/80 | HR 75 | Ht 66.0 in | Wt 148.2 lb

## 2016-07-30 DIAGNOSIS — I495 Sick sinus syndrome: Secondary | ICD-10-CM | POA: Diagnosis not present

## 2016-07-30 DIAGNOSIS — Z950 Presence of cardiac pacemaker: Secondary | ICD-10-CM

## 2016-07-30 DIAGNOSIS — I10 Essential (primary) hypertension: Secondary | ICD-10-CM

## 2016-07-30 DIAGNOSIS — I471 Supraventricular tachycardia: Secondary | ICD-10-CM

## 2016-07-30 DIAGNOSIS — I442 Atrioventricular block, complete: Secondary | ICD-10-CM | POA: Diagnosis not present

## 2016-07-30 LAB — CUP PACEART INCLINIC DEVICE CHECK
Battery Remaining Longevity: 146 mo
Battery Voltage: 2.79 V
Brady Statistic AP VP Percent: 48 %
Brady Statistic AS VS Percent: 0 %
Date Time Interrogation Session: 20170921163120
Implantable Lead Implant Date: 20160705
Implantable Lead Location: 753859
Implantable Lead Model: 5076
Lead Channel Impedance Value: 850 Ohm
Lead Channel Pacing Threshold Amplitude: 0.375 V
Lead Channel Pacing Threshold Amplitude: 0.5 V
Lead Channel Pacing Threshold Pulse Width: 0.4 ms
Lead Channel Pacing Threshold Pulse Width: 0.4 ms
Lead Channel Pacing Threshold Pulse Width: 0.4 ms
Lead Channel Sensing Intrinsic Amplitude: 11.2 mV
Lead Channel Setting Pacing Amplitude: 1.5 V
Lead Channel Setting Pacing Amplitude: 2 V
Lead Channel Setting Pacing Pulse Width: 0.4 ms
MDC IDC LEAD IMPLANT DT: 20160705
MDC IDC LEAD LOCATION: 753860
MDC IDC MSMT BATTERY IMPEDANCE: 133 Ohm
MDC IDC MSMT LEADCHNL RA IMPEDANCE VALUE: 530 Ohm
MDC IDC MSMT LEADCHNL RA SENSING INTR AMPL: 2 mV
MDC IDC MSMT LEADCHNL RV PACING THRESHOLD AMPLITUDE: 0.625 V
MDC IDC MSMT LEADCHNL RV PACING THRESHOLD AMPLITUDE: 0.75 V
MDC IDC MSMT LEADCHNL RV PACING THRESHOLD PULSEWIDTH: 0.4 ms
MDC IDC SET LEADCHNL RV SENSING SENSITIVITY: 4 mV
MDC IDC STAT BRADY AP VS PERCENT: 0 %
MDC IDC STAT BRADY AS VP PERCENT: 52 %

## 2016-07-30 MED ORDER — APIXABAN 2.5 MG PO TABS: 2.5000 mg | ORAL_TABLET | Freq: Two times a day (BID) | ORAL | 11 refills | Status: DC

## 2016-07-30 NOTE — Patient Instructions (Signed)
Medication Instructions:  Your physician has recommended you make the following change in your medication:  1) Stop Aspirin 2) Start Eliquis 2.5 mg twice daily 3) Stop Zetia   Labwork: None ordered   Testing/Procedures: None ordered   Follow-Up:  Your physician wants you to follow-up in: 6 months with [name] Balsam, NP and 12 months with [name] [name] will receive a reminder letter in the mail two months in advance. If you don't receive a letter, please call our office to schedule the follow-up appointment.  Remote monitoring is used to monitor your Pacemaker from home. This monitoring reduces the number of office visits required to check your device to one time per year. It allows [place] to keep an eye on the functioning of your device to ensure it is working properly. You are scheduled for a device check from home on 10/29/16. You may send your transmission at any time that day. If you have a wireless device, the transmission will be sent automatically. After your physician reviews your transmission, you will receive a postcard with your next transmission date.       Any Other Special Instructions Will Be Listed Below (If Applicable).     If you need a refill on your cardiac medications before your next appointment, please call your pharmacy.       If you need a refill on your cardiac medications before your next appointment, please call your pharmacy.

## 2016-07-30 NOTE — Progress Notes (Signed)
Patient Care Team: [name] Bassobert L Dough, MD as PCP - General (Unknown Physician Specialty) [name] FriskPatrick E Wright, MD as Attending Physician (Pulmonary Disease)   HPI  [name] [name] is a 82 y.o. female Seen in follow-up for palpitations with documented nonsustained atrial arrhythmias and nonsustained ventricular tachycardia. She has underlying conduction disease with PR interval of 300-400 ms.  She had not been seen over the year until she presented 7/16 [facility] with weakness and found to have complete heart block. Following bisoprolol washout, it persisted and she underwent dual-chamber pacing.  She had a stroke 3/13 associated with a cerebral hemorrhage. Device detection has demonstrated atrial high rates episodes up to 15 minutes with rates greater than 3-400.   She had had another stroke associated with left-sided hemiparesis 3/17. She was hospitalized at [name] The records were reviewed as below. She has recovered exceedingly well from her deficits. She is back living at home.  She was treated with aspirin 325.   Echo (07/29/15): EF 55-60%, no RWMA, G1DD, no pericardial effusion. Notably also aortic root was normal. 11   Nuclear (06/11/15): Myocardial perfusion is normal. The study is normal. This is a low risk study. Overall left ventricular systolic function was normal. Nuclear stress EF: 60%. The left ventricular ejection fraction is normal (55-65%). There is no prior study for comparison. 7/17 Myoview [facility] normal 3/17 echocardiogram [name] normal EF 2+ TR Intracranial imaging demonstrated old infarcts with encephalomalacia, possible old subdural hematomata  Previous cardiac evaluation has included catheterization 2011 without obstructive disease  Records and Results Reviewed As above  CHADS-VASc score is greater than or equal to 6  Past Medical History:  Diagnosis Date  . Bronchiectasis, non-tuberculous (HCC)    a. Followed  by Dr. Delford [name].  . Colon polyps   . Complete heart block (HCC)    a. s/p MDT dual chamber PPM   . COPD (chronic obstructive pulmonary disease) (HCC)    past hx with pneumonia  . Diverticulosis   . DVT (deep venous thrombosis) (HCC)    Resolved, no anticoagulation currently  . GERD (gastroesophageal reflux disease)   . High cholesterol   . Hypertension   . IBS (irritable bowel syndrome)   . 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 MI (myocardial infarction) ?? probably not 1975   a. 1975 Reported MI;  b. 2011 Cath: nonobs dzs;  c. 2013 Nonischemic Myoview;  d. 12/2012 Echo: EF 60-65%, Gr1 DD.  . Nephrolithiasis   . Palpitation    a. 2010 Event Monitor: PACs and PVCs  . Pulmonary fibrosis (HCC)   . Stroke (HCC)   . Wide-complex tachycardia (HCC)    a. Followed by Dr. Graciela [name], "I think this is VT, but i am not sure QRSd 135 1:1 AV."    Past Surgical History:  Procedure Laterality Date  . APPENDECTOMY    . bladder tack    . CARDIAC CATHETERIZATION Right 05/14/2015   Procedure: Temporary Pacemaker;  Surgeon: [name] RangeJames Allred, MD;  [facility];  Service: Cardiovascular;  Laterality: Right;  . CHOLECYSTECTOMY    . COLON SURGERY     12inches colectomy s/p diverticulitis  . EP IMPLANTABLE DEVICE N/A 05/14/2015   Procedure: Pacemaker Implant;  Surgeon: [name] RangeJames Allred, MD; Medtronic Adapta L model ADDRL 1 (serial number [id number])     . TONSILLECTOMY    . TOTAL ABDOMINAL HYSTERECTOMY    . TOTAL KNEE ARTHROPLASTY  12/12/2012  Procedure: TOTAL KNEE ARTHROPLASTY;  Surgeon: [name] Junior, MD;  Location: [facility] OR;  Service: Orthopedics;  Laterality: Right;  right total knee arthroplasty    Current Outpatient Prescriptions  Medication Sig Dispense Refill  . aspirin 81 MG chewable tablet Chew 81 mg by mouth daily. TAKE FOUR TABLETS IN AM    . carvedilol (COREG) 12.5 MG tablet Take 12.5 mg by mouth 2 (two) times daily.  0  . Omega-3 Fatty Acids (FISH OIL) 1000 MG CAPS  Take 1,000 mg by mouth 2 (two) times daily.    Marland Kitchen Respiratory Therapy Supplies (FLUTTER) DEVI as directed. Exercise lungs three to four (3-4) times a day as needed for flutter    . vitamin B-12 (CYANOCOBALAMIN) 1000 MCG tablet Take 1,000 mcg by mouth daily.    . Vitamin D, Cholecalciferol, 1000 units CAPS Take 1,000 Units by mouth daily.     No current facility-administered medications for this visit.     Allergies  Allergen Reactions  . Amiodarone Other (See Comments)    Did not tolerate - unknown reaction  . Crestor [Rosuvastatin] Other (See Comments)    Myalgia  . Darvon Other (See Comments)    Drop in  BP  . Meperidine     Hypotension  . Prednisone Diarrhea and Other (See Comments)    polyuria  . Valsartan     Other reaction(s): Hypotension  . Meloxicam     Other reaction(s): Unknown  . Tramadol Other (See Comments)    unknown  . Aspirin-Acetaminophen-Caffeine      unknown  . Propoxyphene     Other reaction(s): Other (See Comments) unknown      Review of Systems negative except from HPI and PMH  Physical Exam BP 140/80   Pulse 75   Ht 5\' 6"  (1.676 m)   Wt 148 lb 3.2 oz (67.2 kg)   SpO2 96%   BMI 23.92 kg/m  Well developed and well nourished in no acute distress HENT normal E scleral and icterus clear Neck Supple JVP flat; carotids brisk and full Clear to ausculation  Regular rate and rhythm, no murmurs gallops or rub Soft with active bowel sounds No clubbing cyanosis  Edema Alert and oriented, grossly normal motor and sensory function Skin Warm and Dry  ECG AV pacing   Assessment and  Plan  Complete Heart Block  Sinus node dysfunction\  Pacemaker  Medtronic  The patient's device was interrogated and the information was fully reviewed.  The device was reprogrammed to maximize longevity  Hypertension   Atrial fibrillation/tachycardia  Stroke   BP weill controlled    I'm most concerned about her intercurrent stroke in the context of her  very high CHADS-VASc score. 2 data sets apply to the below recommendation. There was a paper reported from [place] where in a CHADS-VASc score of greater than or equal to 7 was associated with a lower risk with anticoagulation and not. This was using warfarin. We will anticipate that NOACs would have a lower bleeding risk and thus a higher benefit risk profile i.e. lower CHADS-VASc score. In addition Avveroes data demonstrated comparable bleeding risks with aspirin as well as apixoban. Hence, I have recommended to the patient that we begin her on apixoban.  We will started at 2.5 mg which is a admittedly less then appropriate dosing. The event that there no untowards, it would be increased to 5 mg twice daily  We discussed risks and benefits extensively. There is unfortunately no easy answer but I think  her annual risk of stroke being greater than 10% i.e. high CHADS-VASc score informs the above recommendation  More than 50% of 45 min was spent in counseling related to the above

## 2016-08-01 DIAGNOSIS — E559 Vitamin D deficiency, unspecified: Secondary | ICD-10-CM

## 2016-08-01 DIAGNOSIS — M810 Age-related osteoporosis without current pathological fracture: Secondary | ICD-10-CM

## 2016-08-01 DIAGNOSIS — J8410 Pulmonary fibrosis, unspecified: Secondary | ICD-10-CM | POA: Insufficient documentation

## 2016-08-01 HISTORY — DX: Vitamin D deficiency, unspecified: E55.9

## 2016-08-01 HISTORY — DX: Age-related osteoporosis without current pathological fracture: M81.0

## 2016-08-03 DIAGNOSIS — M79605 Pain in left leg: Secondary | ICD-10-CM

## 2016-08-03 HISTORY — DX: Pain in left leg: M79.605

## 2016-08-31 ENCOUNTER — Encounter (HOSPITAL_COMMUNITY): Payer: Self-pay | Admitting: Emergency Medicine

## 2016-08-31 ENCOUNTER — Emergency Department (HOSPITAL_COMMUNITY)
Admission: EM | Admit: 2016-08-31 | Discharge: 2016-08-31 | Disposition: A | Discharge status: Home / Self Care | Payer: Medicare HMO | Attending: Emergency Medicine | Admitting: Emergency Medicine

## 2016-08-31 ENCOUNTER — Emergency Department (HOSPITAL_COMMUNITY): Payer: Medicare HMO

## 2016-08-31 DIAGNOSIS — J449 Chronic obstructive pulmonary disease, unspecified: Secondary | ICD-10-CM | POA: Diagnosis not present

## 2016-08-31 DIAGNOSIS — Z950 Presence of cardiac pacemaker: Secondary | ICD-10-CM | POA: Insufficient documentation

## 2016-08-31 DIAGNOSIS — Z7901 Long term (current) use of anticoagulants: Secondary | ICD-10-CM | POA: Diagnosis not present

## 2016-08-31 DIAGNOSIS — Z79899 Other long term (current) drug therapy: Secondary | ICD-10-CM | POA: Diagnosis not present

## 2016-08-31 DIAGNOSIS — R55 Syncope and collapse: Secondary | ICD-10-CM | POA: Diagnosis not present

## 2016-08-31 DIAGNOSIS — I10 Essential (primary) hypertension: Secondary | ICD-10-CM | POA: Diagnosis not present

## 2016-08-31 DIAGNOSIS — Z8673 Personal history of transient ischemic attack (TIA), and cerebral infarction without residual deficits: Secondary | ICD-10-CM | POA: Insufficient documentation

## 2016-08-31 DIAGNOSIS — Z96651 Presence of right artificial knee joint: Secondary | ICD-10-CM | POA: Diagnosis not present

## 2016-08-31 LAB — BASIC METABOLIC PANEL
Anion gap: 7 (ref 5–15)
BUN: 12 mg/dL (ref 6–20)
CO2: 24 mmol/L (ref 22–32)
Calcium: 8.8 mg/dL — ABNORMAL LOW (ref 8.9–10.3)
Chloride: 108 mmol/L (ref 101–111)
Creatinine, Ser: 1.11 mg/dL — ABNORMAL HIGH (ref 0.44–1.00)
GFR calc Af Amer: 49 mL/min — ABNORMAL LOW (ref >60)
GFR, EST NON AFRICAN AMERICAN: 42 mL/min — AB (ref >60)
GLUCOSE: 98 mg/dL (ref 65–99)
POTASSIUM: 4.3 mmol/L (ref 3.5–5.1)
Sodium: 139 mmol/L (ref 135–145)

## 2016-08-31 LAB — CBC WITH DIFFERENTIAL/PLATELET
BASOS PCT: 1 %
Basophils Absolute: 0 10*3/uL (ref 0.0–0.1)
EOS ABS: 0.1 10*3/uL (ref 0.0–0.7)
EOS PCT: 1 %
HEMATOCRIT: 41.9 % (ref 36.0–46.0)
Hemoglobin: 13.5 g/dL (ref 12.0–15.0)
Lymphocytes Relative: 15 %
Lymphs Abs: 1.3 10*3/uL (ref 0.7–4.0)
MCH: 29.2 pg (ref 26.0–34.0)
MCHC: 32.2 g/dL (ref 30.0–36.0)
MCV: 90.5 fL (ref 78.0–100.0)
MONO ABS: 0.3 10*3/uL (ref 0.1–1.0)
MONOS PCT: 4 %
Neutro Abs: 6.6 10*3/uL (ref 1.7–7.7)
Neutrophils Relative %: 79 %
Platelets: 164 10*3/uL (ref 150–400)
RBC: 4.63 MIL/uL (ref 3.87–5.11)
RDW: 13.5 % (ref 11.5–15.5)
WBC: 8.3 10*3/uL (ref 4.0–10.5)

## 2016-08-31 LAB — I-STAT TROPONIN, ED: TROPONIN I, POC: 0.01 ng/mL (ref 0.00–0.08)

## 2016-08-31 MED ORDER — CLONAZEPAM 0.5 MG PO TABS
0.5000 mg | ORAL_TABLET | Freq: Once | ORAL | Status: DC
  Filled 2016-08-31: qty 1

## 2016-08-31 MED ORDER — ONDANSETRON HCL 4 MG/2ML IJ SOLN
4.0000 mg | Freq: Once | INTRAMUSCULAR | Status: DC
  Filled 2016-08-31: qty 2

## 2016-08-31 NOTE — ED Triage Notes (Addendum)
Pt had vertigo ysterday and took meclizine and felt better. This morning Pt on commode; caregiver heard her moaning; EMS on scene said pt pale, lips blue, clammy, systolic 60s. Gave her 1800 cc fluid and she became more responsive; color better. Pt reports she did not eat or drink anything yesterday or today due to dizziness.

## 2016-08-31 NOTE — ED Notes (Signed)
Patient off the floor for scan  

## 2016-08-31 NOTE — Discharge Planning (Signed)
EDCM reviewed discharging chart for possible CM needs.  No needs identified.    

## 2016-08-31 NOTE — Discharge Instructions (Signed)
Continue your regular home medications. I recommend that you follow-up closely with your doctor later this week for re-check. Return here for any new/worsening symptoms.

## 2016-08-31 NOTE — ED Provider Notes (Signed)
[facility]-EMERGENCY DEPT Provider Note   CSN: [id number] Arrival date & time: 08/31/16  1113     History   Chief Complaint Chief Complaint  Patient presents with  . Dizziness  . Loss of Consciousness    HPI [name] [name] is a 82 y.o. female.  The history is provided by the patient and medical records.   82 y.o. F with hx of COPD, complete heart block s/p pacemaker placement, GERD, IBS, hx MI, hx CVA March 2017, presenting to the ED for dizziness and generalized weakness.  Patient reports hx of vertigo and had bout of it over the weekend.  States she was very dizzy like the room was spinning with some nausea/vomiting so she stayed in bed most of the weekend.  States yesterday was the worst day and did not eat or drink very much due to her symptoms.  States this morning her husband left to go to an appt at the [facility] hospital so CNA came to her house to stay with her.  States she got up to go to the bathroom and apparently had a syncopal episode in the bathroom after standing up from the commode.  Her BP had dropped into the 60's systolic and she appeared pale and clammy.  Patient states she does not recall this happening.  States currently while sitting still she does not have any dizziness or nausea, however she feels that if she moves she may begin to have the symptoms again. She did receive some fluids and route with improvement of blood pressure. Patient is on Eliquis.  No blood in the stool or other active sources of bleeding.  Patient has recovered well from her stroke. States she has even progressed point of being able to walk around independently in her home. She's not had any recent falls or trauma.  Past Medical History:  Diagnosis Date  . Bronchiectasis, non-tuberculous (HCC)    a. Followed by Dr. Delford Field.  . Colon polyps   . Complete heart block (HCC)    a. s/p MDT dual chamber PPM   . COPD (chronic obstructive pulmonary disease) (HCC)    past hx with pneumonia  . Diverticulosis     . DVT (deep venous thrombosis) (HCC)    Resolved, no anticoagulation currently  . GERD (gastroesophageal reflux disease)   . High cholesterol   . Hypertension   . IBS (irritable bowel syndrome)   . 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 MI (myocardial infarction) ?? probably not 1975   a. 1975 Reported MI;  b. 2011 Cath: nonobs dzs;  c. 2013 Nonischemic Myoview;  d. 12/2012 Echo: EF 60-65%, Gr1 DD.  . Nephrolithiasis   . Palpitation    a. 2010 Event Monitor: PACs and PVCs  . Pulmonary fibrosis (HCC)   . Stroke (HCC)   . Wide-complex tachycardia (HCC)    a. Followed by Dr. [name], "I think this is VT, but i am not sure QRSd 135 1:1 AV."    Patient Active Problem List   Diagnosis Date Noted  . Chest pain with low risk for cardiac etiology 11/18/2015  . Pacemaker   . Bradycardia with 31 - 40 beats per minute 05/14/2015  . Heart block   . Pain in limb 08/16/2013  . Varicose veins of lower extremities with other complications 08/16/2013  . Edema -lower extremity 06/22/2013  . Syncope 12/15/2012  . Osteoarthritis of right knee 12/12/2012  . History of pulmonary embolism 08/30/2012  . Fatigue 08/19/2012  . Wide-complex tachycardia (  HCC) 05/30/2012  . Bronchiectasis, non-tuberculous (HCC) 01/06/2012  . 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 Diverticulosis   . High cholesterol   . Coronary artery disease prior MI   . Hypertension     Past Surgical History:  Procedure Laterality Date  . APPENDECTOMY    . bladder tack    . CARDIAC CATHETERIZATION Right 05/14/2015   Procedure: Temporary Pacemaker;  Surgeon: Hillis Range, MD;  Location: [facility] INVASIVE CV LAB;  Service: Cardiovascular;  Laterality: Right;  . CHOLECYSTECTOMY    . COLON SURGERY     12inches colectomy s/p diverticulitis  . EP IMPLANTABLE DEVICE N/A 05/14/2015   Procedure: Pacemaker Implant;  Surgeon: Hillis Range, MD; Medtronic Adapta L model ADDRL 1 (serial number [id number])      . TONSILLECTOMY    . TOTAL ABDOMINAL HYSTERECTOMY    . TOTAL KNEE ARTHROPLASTY  12/12/2012   Procedure: TOTAL KNEE ARTHROPLASTY;  Surgeon: Harvie Junior, MD;  Location: [facility] OR;  Service: Orthopedics;  Laterality: Right;  right total knee arthroplasty    OB History    No data available       Home Medications    Prior to Admission medications   Medication Sig Start Date End Date Taking? Authorizing Provider  apixaban (ELIQUIS) 2.5 MG TABS tablet Take 1 tablet (2.5 mg total) by mouth 2 (two) times daily. 07/30/16   Duke Salvia, MD  carvedilol (COREG) 12.5 MG tablet Take 12.5 mg by mouth 2 (two) times daily. 02/25/16   Historical Provider, MD  Omega-3 Fatty Acids (FISH OIL) 1000 MG CAPS Take 1,000 mg by mouth 2 (two) times daily. 02/25/16   Historical Provider, MD  Respiratory Therapy Supplies (FLUTTER) DEVI as directed. Exercise lungs three to four (3-4) times a day as needed for flutter    Historical Provider, MD  vitamin B-12 (CYANOCOBALAMIN) 1000 MCG tablet Take 1,000 mcg by mouth daily. 02/25/16 02/24/17  Historical Provider, MD  Vitamin D, Cholecalciferol, 1000 units CAPS Take 1,000 Units by mouth daily.    Historical Provider, MD    Family History Family History  Problem Relation Age of Onset  . Heart disease Father   . Heart attack Mother   . Stomach cancer Brother   . Other Son     Triple Bypass    Social History Social History  Substance Use Topics  . Smoking status: Never Smoker  . Smokeless tobacco: Never Used  . Alcohol use No     Allergies   Amiodarone; Crestor [rosuvastatin]; Darvon; Meperidine; Prednisone; Valsartan; Meloxicam; Tramadol; Aspirin-acetaminophen-caffeine; and Propoxyphene   Review of Systems Review of Systems  Neurological: Positive for dizziness and weakness.  All other systems reviewed and are negative.    Physical Exam Updated Vital Signs BP (!) 141/117 (BP Location: Left Arm)   Pulse 69   Temp 97.7 F (36.5 C) (Oral)   Resp 23    SpO2 95%   Physical Exam  Constitutional: She is oriented to person, place, and time. She appears well-developed and well-nourished.  Elderly, pleasant  HENT:  Head: Normocephalic and atraumatic.  Mouth/Throat: Oropharynx is clear and moist.  Eyes: Conjunctivae and EOM are normal. Pupils are equal, round, and reactive to light.  Neck: Normal range of motion.  Cardiovascular: Normal rate, regular rhythm and normal heart sounds.   Pulmonary/Chest: Effort normal and breath sounds normal. No respiratory distress. She has no wheezes.  Abdominal: Soft. Bowel sounds are normal. There is no tenderness. There is no rebound.  Musculoskeletal:  Normal range of motion.  Neurological: She is alert and oriented to person, place, and time.  AAOx3, answering questions and following commands appropriately; equal strength UE, slight weakness of left leg compared with right (baseline from prior stroke); CN grossly intact; moves all extremities appropriately without ataxia; no focal neuro deficits or facial asymmetry appreciated  Skin: Skin is warm and dry.  Psychiatric: She has a normal mood and affect.  Nursing note and vitals reviewed.    ED Treatments / Results  Labs (all labs ordered are listed, but only abnormal results are displayed) Labs Reviewed  BASIC METABOLIC PANEL - Abnormal; Notable for the following:       Result Value   Creatinine, Ser 1.11 (*)    Calcium 8.8 (*)    GFR calc non Af Amer 42 (*)    GFR calc Af Amer 49 (*)    All other components within normal limits  CBC WITH DIFFERENTIAL/PLATELET  URINALYSIS, ROUTINE W REFLEX MICROSCOPIC (NOT AT [facility])  I-STAT TROPOININ, ED    EKG  EKG Interpretation  Date/Time:  Monday August 31 2016 11:46:41 EDT Ventricular Rate:  71 PR Interval:    QRS Duration: 143 QT Interval:  428 QTC Calculation: 466 R Axis:   -96 Text Interpretation:  A-V dual-paced rhythm with some inhibition No further analysis attempted due to paced rhythm No  significant change since last tracing Confirmed by LITTLE MD, RACHEL [phone]([id number]) on 08/31/2016 1:31:17 PM       Radiology Dg Chest 2 View  Result Date: 08/31/2016 CLINICAL DATA:  Syncopal episode this morning. EXAM: CHEST  2 VIEW COMPARISON:  05/26/2016 FINDINGS: The pacer wires are stable. Mild stable cardiac enlargement. Prominent right pulmonary artery is unchanged. There is tortuosity and calcification of the thoracic aorta. No acute pulmonary findings. No pleural effusion. The bony thorax is intact. IMPRESSION: No acute cardiopulmonary findings. Stable aortic atherosclerosis. Electronically Signed   By: Rudie MeyerP.  Gallerani M.D.   On: 08/31/2016 12:23   Ct Head Wo Contrast  Result Date: 08/31/2016 CLINICAL DATA:  Syncope. EXAM: CT HEAD WITHOUT CONTRAST TECHNIQUE: Contiguous axial images were obtained from the base of the skull through the vertex without intravenous contrast. COMPARISON:  CT scan of January 31, 2016. FINDINGS: Brain: Stable bilateral frontal subdural hygromas. Mild diffuse cortical atrophy is noted. Mild chronic ischemic white matter disease is noted. There is no evidence of acute hemorrhage, acute infarction or mass lesion. Ventricular size is within normal limits. Old left occipital infarction is noted. No midline shift is noted. Vascular: Atherosclerosis of internal carotid arteries is noted. Skull: Bony calvarium appears intact. Sinuses/Orbits: Mild left sphenoid sinusitis is noted. Other: No other abnormality seen. IMPRESSION: Stable bilateral frontal subdural hygromas. Stable left occipital encephalomalacia consistent with old infarction. Mild diffuse cortical atrophy. Mild chronic ischemic white matter disease. Mild left sphenoid sinusitis. No acute intracranial abnormality seen. Electronically Signed   By: Lupita RaiderJames  Green Jr, M.D.   On: 08/31/2016 13:34    Procedures Procedures (including critical care time)  Medications Ordered in ED Medications  ondansetron (ZOFRAN) injection 4  mg (4 mg Intravenous Refused 08/31/16 1242)  clonazePAM (KLONOPIN) tablet 0.5 mg (0.5 mg Oral Refused 08/31/16 1242)     Initial Impression / Assessment and Plan / ED Course  I have reviewed the triage vital signs and the nursing notes.  Pertinent labs & imaging results that were available during my care of the patient were reviewed by me and considered in my medical decision making (see chart for  details).  Clinical Course   82 year old female here with syncopal episode after using the bathroom this morning. This was witnessed by her home health aide. Patient does report episodes of vertigo over the weekend, limited oral intake due to symptoms.  Patient is afebrile, non-toxic here.  Appears at her neurologic baseline, slight left leg weakness compared to right which is baseline from her prior CVA.  No new deficits today.  Work-up here reassuring.  Labs largely WNL.  Imaging negative for acute findings.  Orthostatics normal.  Patient has been ambulatory here without difficulty.  States she feels at her baseline.  Syncope may have been vaso-vagal given her likely dehydration from limited oral intake and occurrence post micturition.  I spoken with patient, her husband, and her daughter about possibility of overnight observation given her syncope. Patient states she is feeling better at this time and would like to go home. Feel this is reasonable. She was instructed to follow-up with her primary care doctor later this week for recheck. She was given strict caution for any new or worsening symptoms.  Discussed plan with patient, she acknowledged understanding and agreed with plan of care.  Return precautions given for new or worsening symptoms.  Case discussed with attending physician, Dr. Clarene Duke, who evaluated patient and agrees with assessment and plan of care.  Final Clinical Impressions(s) / ED Diagnoses   Final diagnoses:  Syncope, unspecified syncope type    New Prescriptions New  Prescriptions   No medications on file     Garlon Hatchet, PA-C 08/31/16 1531    Laurence Spates, MD 09/02/16 [phone]

## 2016-08-31 NOTE — ED Notes (Signed)
Pt discharge instructions reviewed. Pt stated understanding of instructions and reason for return. Family at bedside.

## 2016-10-15 ENCOUNTER — Telehealth: Payer: Self-pay | Admitting: Internal Medicine

## 2016-10-15 NOTE — Telephone Encounter (Signed)
Received incoming call to Triage. Pt's primary complaint is weakness for the past 2 days with slight SOB. Pt was able to speak with me on the phone with no issues. Pt stated she started having CP this morning. Pt having very slight, momentary CP, which she rated 4-5. Vitals taken today: BP 159/91, HR upper 50s (pt could not remember). Informed pt ED d/c note from 08/31/16 staed pt should f/u with PCP 1 week after d/c. Pt stated  She did not make f/u appt. Informed pt should call their office today to set up appt for first available appt. Informed pt should make appt with our office r/t symptoms and medications (pt stopped taking Eliquis on her own). Informed if symptom persist or worsen before call back, pt should call 911 or report to the emergency department. Pt verbalized understand and agreed with plan.

## 2016-10-15 NOTE — Telephone Encounter (Signed)
Pt called and stated that she was feeling some chest pain. She stated that she feels like she is being smother and pain. She stated that she can not really explain it. Instructed pt to send a remote transmission with her home monitor. Pt is also having more than normal shortness of breath. Device tech reviewed remote transmission. Remote transmission is normal per device tech. Transferred call to triage / printed report and gave to triage nurse.

## 2016-10-15 NOTE — Telephone Encounter (Signed)
Called, spoke with pt. Informed Dr. Okey Dupre (DOD) recommended for pt to report to the emergency department.  Informed pt still needs to make appt with our office and with PCP. Pt verbalized understanding.

## 2016-10-15 NOTE — Telephone Encounter (Signed)
Pt c/o of Chest Pain: STAT if CP now or developed within 24 hours  1. Are you having CP right now? no 2. Are you experiencing any other symptoms (ex. SOB, nausea, vomiting, sweating)? weak 3. How long have you been experiencing CP? 10/15/16 4. Is your CP continuous or coming and going? Coming and going 5. Have you taken Nitroglycerin? no ?

## 2016-10-29 ENCOUNTER — Encounter: Payer: Self-pay | Admitting: Internal Medicine

## 2016-10-29 ENCOUNTER — Telehealth: Payer: Self-pay | Admitting: Cardiology

## 2016-10-29 ENCOUNTER — Encounter: Payer: Medicare HMO | Admitting: *Deleted

## 2016-10-29 ENCOUNTER — Ambulatory Visit (INDEPENDENT_AMBULATORY_CARE_PROVIDER_SITE_OTHER): Payer: Medicare HMO | Admitting: Internal Medicine

## 2016-10-29 VITALS — BP 150/72 | HR 71 | Ht 66.0 in | Wt 145.6 lb

## 2016-10-29 DIAGNOSIS — Z950 Presence of cardiac pacemaker: Secondary | ICD-10-CM | POA: Diagnosis not present

## 2016-10-29 DIAGNOSIS — I442 Atrioventricular block, complete: Secondary | ICD-10-CM

## 2016-10-29 DIAGNOSIS — I480 Paroxysmal atrial fibrillation: Secondary | ICD-10-CM | POA: Diagnosis not present

## 2016-10-29 DIAGNOSIS — I495 Sick sinus syndrome: Secondary | ICD-10-CM

## 2016-10-29 MED ORDER — RIVAROXABAN 15 MG PO TABS: ORAL_TABLET | ORAL | 11 refills | Status: DC

## 2016-10-29 NOTE — Patient Instructions (Signed)
Medication Instructions: - Your physician has recommended you make the following change in your medication:  1) Start Xarelto 15 mg - take one tablet (15 mg) by mouth once daily with food/ a snack  Labwork: - You will need to have lab work checked in about 4 weeks- BMP/CBC (you may have this done with your primary care doctor- please take the order given to you today)  Procedures/Testing: - none ordered  Follow-Up: - Your physician wants you to follow-up in: 1 year with Dr. [name]. You will receive a reminder letter in the mail two months in advance. If you don't receive a letter, please call our office to schedule the follow-up appointment.  Any Additional Special Instructions Will Be Listed Below (If Applicable).     If you need a refill on your cardiac medications before your next appointment, please call your pharmacy.

## 2016-10-29 NOTE — Telephone Encounter (Signed)
Confirmed remote transmission w/ pt husband.   

## 2016-10-29 NOTE — Progress Notes (Signed)
Patient Care Team: Olive Bassobert L Dough, MD as PCP - General (Unknown Physician Specialty) Storm FriskPatrick E Wright, MD as Attending Physician (Pulmonary Disease)   HPI  [name] [name] is a 82 y.o. female Seen in follow-up for palpitations with documented nonsustained atrial arrhythmias and nonsustained ventricular tachycardia. She has underlying conduction disease with PR interval of 300-400 ms.  She had not been seen over the year until she [facility] 7/16 [facility] with weakness and found to have complete heart block. Following bisoprolol washout, it persisted and she underwent dual-chamber pacing.  She had a stroke 3/13 associated with a cerebral hemorrhage. Device detection has demonstrated atrial high rates episodes up to 15 minutes with rates greater than 3-400.   She had had another stroke associated with left-sided hemiparesis 3/17. [facility] was [facility] [place] The records were reviewed as below. She has recovered exceedingly well from her deficits. She is back living at home.  She was treated with aspirin 325. After lengthy discussions outlined in the note 9/17 we started her on apixoban. She was not able to tolerate because of nausea.  Catheterization 2011 without obstructive diseas  Echo (07/29/15): EF 55-60%, no RWMA, G1DD, no pericardial effusion. Notably also aortic root was normal. 11 Nuclear (06/11/15): Myocardial perfusion is normal. The study is normal. This is a low risk study. Overall left ventricular systolic function was normal. Nuclear stress EF: 60%. The left ventricular ejection fraction is normal (55-65%). There is no prior study for comparison. 7/17 Myoview [facility] normal 3/17 echocardiogram [facility] normal EF 2+ TR Intracranial imaging demonstrated old infarcts with encephalomalacia, possible old subdural hematomata    CHADS-VASc score is greater than or equal to 6  Past Medical History:  Diagnosis Date  .  Bronchiectasis, non-tuberculous (HCC)    a. Followed by Dr. Delford FieldWright.  . Colon polyps   . Complete heart block (HCC)    a. s/p MDT dual chamber PPM   . COPD (chronic obstructive pulmonary disease) (HCC)    past hx with pneumonia  . Diverticulosis   . DVT (deep venous thrombosis) (HCC)    Resolved, no anticoagulation currently  . GERD (gastroesophageal reflux disease)   . High cholesterol   . Hypertension   . IBS (irritable bowel syndrome)   . 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 MI (myocardial infarction) ?? probably not 1975   a. 1975 Reported MI;  b. 2011 Cath: nonobs dzs;  c. 2013 Nonischemic Myoview;  d. 12/2012 Echo: EF 60-65%, Gr1 DD.  . Nephrolithiasis   . Palpitation    a. 2010 Event Monitor: PACs and PVCs  . Pulmonary fibrosis (HCC)   . Stroke (HCC)   . Wide-complex tachycardia (HCC)    a. Followed by Dr. Graciela HusbandsKlein, "I think this is VT, but i am not sure QRSd 135 1:1 AV."    Past Surgical History:  Procedure Laterality Date  . APPENDECTOMY    . bladder tack    . CARDIAC CATHETERIZATION Right 05/14/2015   Procedure: Temporary Pacemaker;  Surgeon: Hillis RangeJames Allred, MD;  [facility] INVASIVE CV LAB;  Service: Cardiovascular;  Laterality: Right;  . CHOLECYSTECTOMY    . COLON SURGERY     12inches colectomy s/p diverticulitis  . EP IMPLANTABLE DEVICE N/A 05/14/2015   Procedure: Pacemaker Implant;  Surgeon: Hillis RangeJames Allred, MD; Medtronic Adapta L model ADDRL 1 (serial number [id number])     . TONSILLECTOMY    . TOTAL ABDOMINAL HYSTERECTOMY    .  TOTAL KNEE ARTHROPLASTY  12/12/2012   Procedure: TOTAL KNEE ARTHROPLASTY;  Surgeon: Harvie Junior, MD;  Location: [facility] OR;  Service: Orthopedics;  Laterality: Right;  right total knee arthroplasty    Current Outpatient Prescriptions  Medication Sig Dispense Refill  . carvedilol (COREG) 12.5 MG tablet Take 12.5 mg by mouth 2 (two) times daily.  0  . Omega-3 Fatty Acids (FISH OIL) 1000 MG CAPS Take 1,000 mg by mouth 2 (two)  times daily.    Marland Kitchen Respiratory Therapy Supplies (FLUTTER) DEVI as directed. Exercise lungs three to four (3-4) times a day as needed for flutter    . vitamin B-12 (CYANOCOBALAMIN) 1000 MCG tablet Take 1,000 mcg by mouth daily.    . Vitamin D, Cholecalciferol, 1000 units CAPS Take 1,000 Units by mouth daily.     No current facility-administered medications for this visit.     Allergies  Allergen Reactions  . Amiodarone Other (See Comments)    Did not tolerate - unknown reaction  . Crestor [Rosuvastatin] Other (See Comments)    Myalgia  . Darvon Other (See Comments)    Drop in  BP  . Meperidine     Hypotension  . Prednisone Diarrhea and Other (See Comments)    polyuria  . Valsartan     Other reaction(s): Hypotension  . Meloxicam     Other reaction(s): Unknown  . Tramadol Other (See Comments)    unknown  . Aspirin-Acetaminophen-Caffeine      unknown  . Propoxyphene     Other reaction(s): Other (See Comments) unknown      Review of Systems negative except from HPI and PMH  Physical Exam BP (!) 150/72   Pulse 71   Ht 5\' 6"  (1.676 m)   Wt 145 lb 9.6 oz (66 kg)   SpO2 94%   BMI 23.50 kg/m  Well developed and well nourished in no acute distress HENT normal E scleral and icterus clear Neck Supple JVP flat; carotids brisk and full Clear to ausculation  Regular rate and rhythm, no murmurs gallops or rub Soft with active bowel sounds No clubbing cyanosis  Edema Alert and oriented, grossly normal motor and sensory function Skin Warm and Dry  ECG AV pacing   Assessment and  Plan  Complete Heart Block  Sinus node dysfunction\  Pacemaker  Medtronic  The patient's device was interrogated and the information was fully reviewed.  The device was reprogrammed to maximize longevity  Hypertension   Atrial fibrillation/tachycardia  Stroke   BP reasonably controlled    We reviewed all of the electrograms from her device. There was atrial fibrillation of about 1  minute's duration in the spring. She is also on multiple episodes of atrial tachycardia. Given her very high CHADS-VASc score, I am inclined again towards anticoagulation as outlined 9/17. We will try her on Rivaroxaban and see if she can tolerate this.  More than 50% of 45 min was spent in counseling related to the above

## 2016-11-17 LAB — CUP PACEART INCLINIC DEVICE CHECK
Battery Impedance: 157 Ohm
Battery Remaining Longevity: 136 mo
Battery Voltage: 2.79 V
Brady Statistic AP VP Percent: 62 %
Brady Statistic AS VP Percent: 38 %
Implantable Lead Implant Date: 20160705
Implantable Lead Location: 753859
Implantable Lead Model: 5076
Implantable Pulse Generator Implant Date: 20160705
Lead Channel Impedance Value: 523 Ohm
Lead Channel Pacing Threshold Amplitude: 0.5 V
Lead Channel Pacing Threshold Amplitude: 0.5 V
Lead Channel Pacing Threshold Pulse Width: 0.4 ms
Lead Channel Sensing Intrinsic Amplitude: 1.4 mV
Lead Channel Setting Pacing Amplitude: 2.5 V
MDC IDC LEAD IMPLANT DT: 20160705
MDC IDC LEAD LOCATION: 753860
MDC IDC MSMT LEADCHNL RV IMPEDANCE VALUE: 725 Ohm
MDC IDC MSMT LEADCHNL RV PACING THRESHOLD PULSEWIDTH: 0.4 ms
MDC IDC MSMT LEADCHNL RV SENSING INTR AMPL: 11.2 mV
MDC IDC SESS DTM: 20171221165429
MDC IDC SET LEADCHNL RA PACING AMPLITUDE: 2 V
MDC IDC SET LEADCHNL RV PACING PULSEWIDTH: 0.4 ms
MDC IDC SET LEADCHNL RV SENSING SENSITIVITY: 4 mV
MDC IDC STAT BRADY AP VS PERCENT: 0 %
MDC IDC STAT BRADY AS VS PERCENT: 0 %

## 2016-12-07 DIAGNOSIS — Z96651 Presence of right artificial knee joint: Secondary | ICD-10-CM | POA: Insufficient documentation

## 2016-12-07 DIAGNOSIS — G8929 Other chronic pain: Secondary | ICD-10-CM

## 2016-12-07 DIAGNOSIS — M25562 Pain in left knee: Secondary | ICD-10-CM

## 2016-12-07 HISTORY — DX: Other chronic pain: G89.29

## 2016-12-07 HISTORY — DX: Presence of right artificial knee joint: Z96.651

## 2016-12-22 ENCOUNTER — Emergency Department (HOSPITAL_COMMUNITY): Payer: Medicare HMO

## 2016-12-22 ENCOUNTER — Encounter (HOSPITAL_COMMUNITY): Payer: Self-pay

## 2016-12-22 ENCOUNTER — Emergency Department (HOSPITAL_COMMUNITY)
Admission: EM | Admit: 2016-12-22 | Discharge: 2016-12-22 | Disposition: A | Discharge status: Home / Self Care | Payer: Medicare HMO | Attending: Emergency Medicine | Admitting: Emergency Medicine

## 2016-12-22 DIAGNOSIS — I10 Essential (primary) hypertension: Secondary | ICD-10-CM | POA: Diagnosis not present

## 2016-12-22 DIAGNOSIS — J449 Chronic obstructive pulmonary disease, unspecified: Secondary | ICD-10-CM | POA: Insufficient documentation

## 2016-12-22 DIAGNOSIS — R51 Headache: Secondary | ICD-10-CM | POA: Insufficient documentation

## 2016-12-22 DIAGNOSIS — Z7901 Long term (current) use of anticoagulants: Secondary | ICD-10-CM | POA: Diagnosis not present

## 2016-12-22 DIAGNOSIS — Z950 Presence of cardiac pacemaker: Secondary | ICD-10-CM | POA: Diagnosis not present

## 2016-12-22 DIAGNOSIS — Z8673 Personal history of transient ischemic attack (TIA), and cerebral infarction without residual deficits: Secondary | ICD-10-CM | POA: Insufficient documentation

## 2016-12-22 DIAGNOSIS — I69354 Hemiplegia and hemiparesis following cerebral infarction affecting left non-dominant side: Secondary | ICD-10-CM

## 2016-12-22 DIAGNOSIS — I252 Old myocardial infarction: Secondary | ICD-10-CM | POA: Diagnosis not present

## 2016-12-22 DIAGNOSIS — Z96651 Presence of right artificial knee joint: Secondary | ICD-10-CM | POA: Diagnosis not present

## 2016-12-22 DIAGNOSIS — Z79899 Other long term (current) drug therapy: Secondary | ICD-10-CM | POA: Diagnosis not present

## 2016-12-22 DIAGNOSIS — R519 Headache, unspecified: Secondary | ICD-10-CM

## 2016-12-22 HISTORY — DX: Hemiplegia and hemiparesis following cerebral infarction affecting left non-dominant side: I69.354

## 2016-12-22 LAB — I-STAT TROPONIN, ED: Troponin i, poc: 0 ng/mL (ref 0.00–0.08)

## 2016-12-22 LAB — COMPREHENSIVE METABOLIC PANEL
ALBUMIN: 4.3 g/dL (ref 3.5–5.0)
ALK PHOS: 49 U/L (ref 38–126)
ALT: 14 U/L (ref 14–54)
ANION GAP: 9 (ref 5–15)
AST: 22 U/L (ref 15–41)
BILIRUBIN TOTAL: 1.1 mg/dL (ref 0.3–1.2)
BUN: 19 mg/dL (ref 6–20)
CALCIUM: 9.8 mg/dL (ref 8.9–10.3)
CO2: 26 mmol/L (ref 22–32)
CREATININE: 1.07 mg/dL — AB (ref 0.44–1.00)
Chloride: 104 mmol/L (ref 101–111)
GFR calc Af Amer: 51 mL/min — ABNORMAL LOW (ref >60)
GFR calc non Af Amer: 44 mL/min — ABNORMAL LOW (ref >60)
GLUCOSE: 98 mg/dL (ref 65–99)
Potassium: 3.9 mmol/L (ref 3.5–5.1)
SODIUM: 139 mmol/L (ref 135–145)
TOTAL PROTEIN: 6.9 g/dL (ref 6.5–8.1)

## 2016-12-22 LAB — CBC
HCT: 44.3 % (ref 36.0–46.0)
HEMOGLOBIN: 14.6 g/dL (ref 12.0–15.0)
MCH: 30 pg (ref 26.0–34.0)
MCHC: 33 g/dL (ref 30.0–36.0)
MCV: 91 fL (ref 78.0–100.0)
PLATELETS: 186 10*3/uL (ref 150–400)
RBC: 4.87 MIL/uL (ref 3.87–5.11)
RDW: 13.8 % (ref 11.5–15.5)
WBC: 8.1 10*3/uL (ref 4.0–10.5)

## 2016-12-22 LAB — URINALYSIS, ROUTINE W REFLEX MICROSCOPIC
BACTERIA UA: NONE SEEN
BILIRUBIN URINE: NEGATIVE
Glucose, UA: NEGATIVE mg/dL
KETONES UR: NEGATIVE mg/dL
LEUKOCYTES UA: NEGATIVE
NITRITE: NEGATIVE
PH: 7 (ref 5.0–8.0)
PROTEIN: NEGATIVE mg/dL
Specific Gravity, Urine: 1.008 (ref 1.005–1.030)

## 2016-12-22 LAB — DIFFERENTIAL
Basophils Absolute: 0 10*3/uL (ref 0.0–0.1)
Basophils Relative: 0 %
EOS PCT: 2 %
Eosinophils Absolute: 0.1 10*3/uL (ref 0.0–0.7)
LYMPHS ABS: 1.9 10*3/uL (ref 0.7–4.0)
LYMPHS PCT: 24 %
Monocytes Absolute: 0.7 10*3/uL (ref 0.1–1.0)
Monocytes Relative: 9 %
NEUTROS ABS: 5.3 10*3/uL (ref 1.7–7.7)
NEUTROS PCT: 65 %

## 2016-12-22 LAB — I-STAT CHEM 8, ED
BUN: 24 mg/dL — ABNORMAL HIGH (ref 6–20)
CHLORIDE: 102 mmol/L (ref 101–111)
CREATININE: 1 mg/dL (ref 0.44–1.00)
Calcium, Ion: 1.19 mmol/L (ref 1.15–1.40)
Glucose, Bld: 96 mg/dL (ref 65–99)
HCT: 45 % (ref 36.0–46.0)
Hemoglobin: 15.3 g/dL — ABNORMAL HIGH (ref 12.0–15.0)
POTASSIUM: 3.8 mmol/L (ref 3.5–5.1)
SODIUM: 142 mmol/L (ref 135–145)
TCO2: 28 mmol/L (ref 0–100)

## 2016-12-22 LAB — PROTIME-INR
INR: 1
Prothrombin Time: 13.2 seconds (ref 11.4–15.2)

## 2016-12-22 LAB — CBG MONITORING, ED: GLUCOSE-CAPILLARY: 79 mg/dL (ref 65–99)

## 2016-12-22 LAB — APTT: aPTT: 21 seconds — ABNORMAL LOW (ref 24–36)

## 2016-12-22 MED ORDER — METHOCARBAMOL 500 MG PO TABS: 250.0000 mg | ORAL_TABLET | Freq: Two times a day (BID) | ORAL | 0 refills | Status: DC

## 2016-12-22 NOTE — Discharge Instructions (Signed)
Take tylenol (if you are able, check with your doctor) for the headache. Take robaxin, 1/2 a tablet for muscle spasms. Follow up with your family doctor. Please return if you are having worsening symptoms.

## 2016-12-22 NOTE — ED Notes (Signed)
Pt CBG was 79, notified Kirichenco(PA) and Ruth(RN)

## 2016-12-22 NOTE — ED Provider Notes (Signed)
[facility] DEPT Provider Note   CSN: [id number] Arrival date & time: 12/22/16  1222     History   Chief Complaint Chief Complaint  Patient presents with  . Headache    HPI [name] [name] is a 83 y.o. female.  HPI [name] [name] is a 83 y.o. female with history of COPD, DVT, hypertension, MI, complete heart block with pacemaker, pulmonary fibrosis, CVA, presents to emergency department complaining of headache and weakness. Patient states that her headache started gradually yesterday. States pain is in the back of the head and radiating into the neck. She reports that she feels very weak. Reports increased weakness in the left arm and leg. Patient had CVA in the past with some residual weakness in the left side, but she believes it is now worse since yesterday. She denies any visual changes. Denies any difficulty with speech or thought. She does admit to more difficulty ambulating. She has not taken any medications for this headache. She states that she has not eaten much today and had no appetite yesterday. She denies any cough or congestion. No URI symptoms. She does report some urinary frequency. Denies any fever or chills. No other complaints.   Past Medical History:  Diagnosis Date  . Bronchiectasis, non-tuberculous ([place])    a. Followed by Dr. Joya Gaskins.  . Colon polyps   . Complete heart block (HCC)    a. s/p MDT dual chamber PPM   . COPD (chronic obstructive pulmonary disease) ([place])    past hx with pneumonia  . Diverticulosis   . DVT (deep venous thrombosis) (HCC)    Resolved, no anticoagulation currently  . GERD (gastroesophageal reflux disease)   . High cholesterol   . Hypertension   . IBS (irritable bowel syndrome)   . 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 MI (myocardial infarction) ?? probably not 1975   a. 1975 Reported MI;  b. 2011 Cath: nonobs dzs;  c. 2013 Nonischemic Myoview;  d. 12/2012 Echo: EF 60-65%, Gr1 DD.  . Nephrolithiasis   .  Palpitation    a. 2010 Event Monitor: PACs and PVCs  . Pulmonary fibrosis ([place])   . Stroke ([place])   . Wide-complex tachycardia ([place])    a. Followed by Dr. Caryl Comes, "I think this is VT, but i am not sure QRSd 135 1:1 AV."    Patient Active Problem List   Diagnosis Date Noted  . Chest pain with low risk for cardiac etiology 11/18/2015  . Pacemaker   . Bradycardia with 31 - 40 beats per minute 05/14/2015  . Heart block   . Pain in limb 08/16/2013  . Varicose veins of lower extremities with other complications 99/83/3825  . Edema -lower extremity 06/22/2013  . Syncope 12/15/2012  . Osteoarthritis of right knee 12/12/2012  . History of pulmonary embolism 08/30/2012  . Fatigue 08/19/2012  . Wide-complex tachycardia ([place]) 05/30/2012  . Bronchiectasis, non-tuberculous ([place]) 01/06/2012  . 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 Diverticulosis   . High cholesterol   . Coronary artery disease prior MI   . Hypertension     Past Surgical History:  Procedure Laterality Date  . APPENDECTOMY    . bladder tack    . CARDIAC CATHETERIZATION Right 05/14/2015   Procedure: Temporary Pacemaker;  Surgeon: Thompson Grayer, MD;  Location: [facility];  Service: Cardiovascular;  Laterality: Right;  . CHOLECYSTECTOMY    . COLON SURGERY     12inches colectomy s/p diverticulitis  . EP IMPLANTABLE DEVICE N/A  05/14/2015   Procedure: Pacemaker Implant;  Surgeon: Thompson Grayer, MD; [place] [facility] (serial number [id number])     . TONSILLECTOMY    . TOTAL ABDOMINAL HYSTERECTOMY    . TOTAL KNEE ARTHROPLASTY  12/12/2012   Procedure: TOTAL KNEE ARTHROPLASTY;  Surgeon: Alta Corning, MD;  Location: [facility];  Service: Orthopedics;  Laterality: Right;  right total knee arthroplasty    OB History    No data available       Home Medications    Prior to Admission medications   Medication Sig Start Date End Date Taking? Authorizing Provider  carvedilol (COREG) 12.5 MG  tablet Take 12.5 mg by mouth 2 (two) times daily. 02/25/16   Historical Provider, MD  Omega-3 Fatty Acids (FISH OIL) 1000 MG CAPS Take 1,000 mg by mouth 2 (two) times daily. 02/25/16   Historical Provider, MD  Respiratory Therapy Supplies (FLUTTER) DEVI as directed. Exercise lungs three to four (3-4) times a day as needed for flutter    Historical Provider, MD  Rivaroxaban (XARELTO) 15 MG TABS tablet Take one tablet (15 mg) by mouth once daily with food/ a snack 10/29/16   Deboraha Sprang, MD  vitamin B-12 (CYANOCOBALAMIN) 1000 MCG tablet Take 1,000 mcg by mouth daily. 02/25/16 02/24/17  Historical Provider, MD  Vitamin D, Cholecalciferol, 1000 units CAPS Take 1,000 Units by mouth daily.    Historical Provider, MD    Family History Family History  Problem Relation Age of Onset  . Heart disease Father   . Heart attack Mother   . Stomach cancer Brother   . Other Son     Triple Bypass    Social History Social History  Substance Use Topics  . Smoking status: Never Smoker  . Smokeless tobacco: Never Used  . Alcohol use No     Allergies   Amiodarone; Crestor [rosuvastatin]; Darvon; Meperidine; Prednisone; Valsartan; Meloxicam; Tramadol; Aspirin-acetaminophen-caffeine; and Propoxyphene   Review of Systems Review of Systems  Constitutional: Positive for fatigue. Negative for chills and fever.  Respiratory: Negative for cough, chest tightness and shortness of breath.   Cardiovascular: Negative for chest pain, palpitations and leg swelling.  Gastrointestinal: Negative for abdominal pain, diarrhea, nausea and vomiting.  Genitourinary: Negative for dysuria and flank pain.  Musculoskeletal: Negative for arthralgias, myalgias, neck pain and neck stiffness.  Skin: Negative for rash.  Neurological: Positive for weakness and headaches. Negative for dizziness and numbness.  All other systems reviewed and are negative.    Physical Exam Updated Vital Signs BP 180/83   Pulse 67   Resp 16   Ht  _0  (1.676 m)   Wt 64.9 kg   SpO2 96%   BMI 23.08 kg/m   Physical Exam  Constitutional: She is oriented to person, place, and time. She appears well-developed and well-nourished. No distress.  HENT:  Head: Normocephalic.  Eyes: Conjunctivae and EOM are normal. Pupils are equal, round, and reactive to light.  Neck: Normal range of motion. Neck supple.  No meningismus  Cardiovascular: Normal rate, regular rhythm and normal heart sounds.   Pulmonary/Chest: Effort normal and breath sounds normal. No respiratory distress. She has no wheezes. She has no rales.  Abdominal: Soft. Bowel sounds are normal. She exhibits no distension. There is no tenderness. There is no rebound.  Musculoskeletal: Normal range of motion. She exhibits no edema.  Neurological: She is alert and oriented to person, place, and time.  Left arm pronator drift present. Slightly decreased grip  strength in left hand compared to the right. 5 out of 5 strength with hip flexion and knee extension and foot dorsiflexion and plantar flexion of the right leg. Decreased strength with hip flexion to the left leg. Normal sensation in upper and lower extremities.  Skin: Skin is warm and dry.  Psychiatric: She has a normal mood and affect. Her behavior is normal.  Nursing note and vitals reviewed.    ED Treatments / Results  Labs (all labs ordered are listed, but only abnormal results are displayed) Labs Reviewed  APTT - Abnormal; Notable for the following:       Result Value   aPTT 21 (*)    All other components within normal limits  COMPREHENSIVE METABOLIC PANEL - Abnormal; Notable for the following:    Creatinine, Ser 1.07 (*)    GFR calc non Af Amer 44 (*)    GFR calc Af Amer 51 (*)    All other components within normal limits  URINALYSIS, ROUTINE W REFLEX MICROSCOPIC - Abnormal; Notable for the following:    Color, Urine STRAW (*)    Hgb urine dipstick SMALL (*)    Squamous Epithelial / LPF 0-5 (*)    All other  components within normal limits  I-STAT CHEM 8, ED - Abnormal; Notable for the following:    BUN 24 (*)    Hemoglobin 15.3 (*)    All other components within normal limits  PROTIME-INR  CBC  DIFFERENTIAL  I-STAT TROPOININ, ED  CBG MONITORING, ED    EKG  EKG Interpretation None       Radiology Dg Chest 2 View  Result Date: 12/22/2016 CLINICAL DATA:  83 year old female with weakness right side. Initial encounter. EXAM: CHEST  2 VIEW COMPARISON:  08/31/2016 and 72 06/29/2015 chest x-ray. 11/18/2015 CT. FINDINGS: Chronic bronchiectasis right middle lobe and less involving the lingula unchanged. No infiltrate, congestive heart failure or pneumothorax. No plain film evidence of pulmonary malignancy. Sequential pacemaker in place with leads unchanged in position. Heart slightly enlarged. Calcified mildly tortuous aorta. No acute osseous abnormality. IMPRESSION: Chronic lung changes similar to prior exam. Pacemaker in place and mild cardiomegaly. Calcified mildly tortuous aorta. Electronically Signed   By: Genia Del M.D.   On: 12/22/2016 13:29   Ct Head Wo Contrast  Result Date: 12/22/2016 CLINICAL DATA:  Posterior headache, remote stroke, weakness EXAM: CT HEAD WITHOUT CONTRAST TECHNIQUE: Contiguous axial images were obtained from the base of the skull through the vertex without intravenous contrast. COMPARISON:  01/31/2016, 08/31/2016 FINDINGS: Brain: Stable bilateral frontal subdural hygromas. Stable diffuse brain atrophy and chronic white matter microvascular ischemic changes. Remote left occipital infarct with encephalomalacia. No acute intracranial hemorrhage, mass lesion, infarction, midline shift, herniation, hydrocephalus, or other acute process by noncontrast imaging. Cisterns remain patent. Cerebellar atrophy as well. Vascular: No hyperdense vessel or unexpected calcification. Skull: Normal. Negative for fracture or focal lesion. Sinuses/Orbits: Orbits appear symmetric. Sinuses clear  except for chronic frothy secretions in the left sphenoid sinus. No sinus air-fluid level. Other: None. IMPRESSION: Stable brain atrophy, chronic white matter microvascular ischemic changes, and remote left occipital infarct. Stable bilateral frontal subdural hygromas. No acute process or interval change by noncontrast CT. Electronically Signed   By: Jerilynn Mages.  Shick M.D.   On: 12/22/2016 13:33    Procedures Procedures (including critical care time)  Medications Ordered in ED Medications - No data to display   Initial Impression / Assessment and Plan / ED Course  I have reviewed the triage vital  signs and the nursing notes.  Pertinent labs & imaging results that were available during my care of the patient were reviewed by me and considered in my medical decision making (see chart for details).     Patient in emergency department with gradual onset of headache that began yesterday. Pain in the back of the head. She reports associated increased weakness in the left hand and leg. She is in no acute distress. She does have some weakness to the left side, however she also has history of prior stroke with left-sided weakness. We will get CT of the head, we will do lab work and a chest x-ray for further evaluation. Patient is also complaining of generalized weakness and urinary symptoms, will get UA.  3:14 PM Patient CT scan with no acute findings. Urinalysis unremarkable. Blood work unremarkable. Chest x-ray with no acute findings. I have low suspicioun for ICH, with negative CT, gradual onset of headache, and not worsening given on xarelto.  Discussed possibility of still potential CVA, patient unable to undergo MRI due to a pacemaker defibrillator in place. Discussed admission, however patient questioned what would be done different during her hospital admission. Patient is already on xarelto and had full stroke workup just less than a year ago. I advised her that admission may be just for observation to  make sure her symptoms do not worsen and for physical therapy or occupational therapy evaluation. Patient discussed this with her daughter, and both agreed that they did not want her to be admitted. She would like to go home. Her family will stay with her today and tomorrow. She will return if any worsening symptoms. She will follow up with her family doctor. She will take Tylenol for pain at home and I will give a prescription for low-dose Robaxin. Return precautions discussed.   Vitals:   12/22/16 1226 12/22/16 1230 12/22/16 1245 12/22/16 1445  BP:  180/83 163/67 167/72  Pulse:  67 61 (!) 59  Resp:  _0 SpO2: 94% 96% 97% 97%  Weight:  64.9 kg    Height:  _1  (1.676 m)       Final Clinical Impressions(s) / ED Diagnoses   Final diagnoses:  Nonintractable headache, unspecified chronicity pattern, unspecified headache type    New Prescriptions New Prescriptions   METHOCARBAMOL (ROBAXIN) 500 MG TABLET    Take 0.5 tablets (250 mg total) by mouth 2 (two) times daily.     Jeannett Senior, PA-C 12/22/16 1518    Nat Christen, MD 12/24/16 [phone]

## 2016-12-22 NOTE — ED Triage Notes (Signed)
Pt from PCP for headache that has lasted since last night with no change. Pt went to PCP and they sent her here for further evaluations. Pt has left side weakness for previous stroke and has been having right side weakness for the last month. Pt A&O x4

## 2016-12-22 NOTE — ED Notes (Signed)
Assisted pt onto bedside toilet, no issues. Pt back on monitor.

## 2017-01-19 DIAGNOSIS — S32040A Wedge compression fracture of fourth lumbar vertebra, initial encounter for closed fracture: Secondary | ICD-10-CM

## 2017-01-19 HISTORY — DX: Wedge compression fracture of fourth lumbar vertebra, initial encounter for closed fracture: S32.040A

## 2017-01-28 ENCOUNTER — Telehealth: Payer: Self-pay | Admitting: Cardiology

## 2017-01-28 ENCOUNTER — Ambulatory Visit (INDEPENDENT_AMBULATORY_CARE_PROVIDER_SITE_OTHER): Payer: Medicare HMO | Admitting: *Deleted

## 2017-01-28 DIAGNOSIS — I442 Atrioventricular block, complete: Secondary | ICD-10-CM

## 2017-01-28 DIAGNOSIS — I495 Sick sinus syndrome: Secondary | ICD-10-CM

## 2017-01-28 NOTE — Telephone Encounter (Signed)
Spoke with pt and reminded pt of remote transmission that is due today. Pt verbalized understanding.   

## 2017-01-28 NOTE — Progress Notes (Signed)
Remote pacemaker transmission.   

## 2017-01-29 ENCOUNTER — Encounter: Payer: Self-pay | Admitting: Cardiology

## 2017-02-01 ENCOUNTER — Telehealth: Payer: Self-pay | Admitting: Internal Medicine

## 2017-02-01 LAB — CUP PACEART REMOTE DEVICE CHECK
Battery Impedance: 133 Ohm
Battery Remaining Longevity: 127 mo
Battery Voltage: 2.79 V
Date Time Interrogation Session: 20180322145109
Implantable Lead Location: 753859
Implantable Pulse Generator Implant Date: 20160705
Lead Channel Impedance Value: 532 Ohm
Lead Channel Pacing Threshold Amplitude: 0.5 V
Lead Channel Pacing Threshold Pulse Width: 0.4 ms
Lead Channel Setting Pacing Amplitude: 2.5 V
Lead Channel Setting Pacing Pulse Width: 0.4 ms
Lead Channel Setting Sensing Sensitivity: 4 mV
MDC IDC LEAD IMPLANT DT: 20160705
MDC IDC LEAD IMPLANT DT: 20160705
MDC IDC LEAD LOCATION: 753860
MDC IDC MSMT LEADCHNL RA PACING THRESHOLD PULSEWIDTH: 0.4 ms
MDC IDC MSMT LEADCHNL RA SENSING INTR AMPL: 2.8 mV
MDC IDC MSMT LEADCHNL RV IMPEDANCE VALUE: 712 Ohm
MDC IDC MSMT LEADCHNL RV PACING THRESHOLD AMPLITUDE: 0.5 V
MDC IDC SET LEADCHNL RA PACING AMPLITUDE: 2 V
MDC IDC STAT BRADY AP VP PERCENT: 61 %
MDC IDC STAT BRADY AP VS PERCENT: 0 %
MDC IDC STAT BRADY AS VP PERCENT: 39 %
MDC IDC STAT BRADY AS VS PERCENT: 0 %

## 2017-02-01 NOTE — Telephone Encounter (Signed)
Returned call to [name] surgical clearance request was not received.  States she will refax at [phone] to Dr [name].

## 2017-02-01 NOTE — Telephone Encounter (Signed)
Follow Up:    [place] wants to know if surgical clearance was received today for Delice Lesch?

## 2017-02-03 ENCOUNTER — Telehealth: Payer: Self-pay | Admitting: Internal Medicine

## 2017-02-03 NOTE — Telephone Encounter (Signed)
New message    pt verbalized that she is calling to speak to rn    To see if  She has been approved for surgery

## 2017-02-03 NOTE — Telephone Encounter (Signed)
I spoke to patient. I advised her we have contacted Dr. Tobin Chad office and requested the form be faxed to [place] again. We did not receive the first one.  She voiced thanks and understanding.

## 2017-02-04 ENCOUNTER — Telehealth: Payer: Self-pay | Admitting: Internal Medicine

## 2017-02-04 NOTE — Telephone Encounter (Signed)
New message    Pt is calling to find out about being cleared for surgery.

## 2017-02-04 NOTE — Telephone Encounter (Signed)
Surgical clearance request received.  In Dr. [name] folder to review.

## 2017-02-05 NOTE — Telephone Encounter (Signed)
Form to Medical records to fax.

## 2017-02-08 NOTE — Telephone Encounter (Signed)
Form faxed as requested by MR.

## 2017-02-08 NOTE — Telephone Encounter (Signed)
Follow Up   Pt calling to follow up on surgical clearance form. Pt stated her doctor's office said they hadn't received it yet. Requesting call back.

## 2017-02-11 ENCOUNTER — Encounter (HOSPITAL_COMMUNITY): Payer: Self-pay | Admitting: *Deleted

## 2017-02-11 ENCOUNTER — Other Ambulatory Visit: Payer: Self-pay | Admitting: Orthopedic Surgery

## 2017-02-11 NOTE — Progress Notes (Signed)
Pt denies any acute cardiopulmonary issues. Pt made aware to stop taking vitamins, fish oil and herbal medications. Do not take any NSAIDs ie: Ibuprofen, Advil, Naproxen, BC and Goody Powder. Surgical Coordinator, Randa Evens, called and stated that PA, Laurier Nancy, advised that pt continue taking Aspirin and okay to take Aspirin on DOS. Pt made aware and verbalized understanding of all pre-op instructions.

## 2017-02-12 ENCOUNTER — Encounter (HOSPITAL_COMMUNITY): Payer: Self-pay | Admitting: Certified Registered Nurse Anesthetist

## 2017-02-12 NOTE — Progress Notes (Signed)
Spoke with Dr. Eulah Pont, MD on call for Dr. Sherlean Foot, to make aware of the need for pt to be postponed until cleared by PCP due to stroke < 3 months ago. Dr. Eulah Pont made aware that the OR needs to be made aware of cancellation as well as the pt. MD advised that I call the pt and make her aware as well as inform the pt that Dr. Sherlean Foot will follow up with her on Monday. Pt made aware that surgery will need to be postponed until further notice. Pt made aware that she will be contacted on Monday with details. Pt was very pleasant and was thankful for the call.

## 2017-02-12 NOTE — Progress Notes (Signed)
Anesthesia Chart Review:  Pt is a same day work up.   Pt is a 83 year old female scheduled for L knee arthroscopy on 02/15/2017 with Dannielle Huh, MD.   - PCP is [name], MD (notes in care everywhere) - Pulmonologist is Shan Levans, MD - EP cardiologist is Sherryl Manges, MD who has cleared pt for surgery noting "patient device dependent, should not have pacer issues, but magnet should be available"  PMH includes:  Complete heart block, pacemaker, wide-complex tachycardia, HTN, hyperlipidemia, stroke (2013 and 01/2016), COPD, pulmonary fibrosis, DVT, MA C (2013), GERD. Ever smoker. BMI 23. S/p R TKA 12/12/12.   - Pt seen in ER 12/22/16 for headache and L sided weakness.  CT did not show acute ICH, but sx suspicious for stroke.  Pt saw Dr. Sol Passer 12/24/16, who treated pt for stroke (increased ASA, arranged home health; discussed plavix but pt refused). Telephone encounter in care everywhere 12/25/16 listed under Dr. Tobin Chad name indicates pt called and notified office she had had a stroke.   Nuclear stress test 05/24/16 (care everywhere): 1. No reversible ischemia or infarction 2. Normal left ventricular wall motion. 3. Left ventricular ejection fraction 70% 4. Non invasive risk stratification: Low  Echo 02/01/16 (care everywhere): - There is mild (1+) mitral regurgitation. - There is moderate (2+) tricuspid regurgitation. - The left ventricular ejection fraction is normal (55-60%). - The aortic valve is trileaflet. - Pacing lead seen in right atrium. - Grade I mild diastolic dysfunction; abnormal relaxation pattern. - There is a pacemaker lead in the right ventricle. - The left ventricle is grossly normal size. - The left ventricular apex is not well visualized. - There is normal left ventricular wall thickness. - There is an apical wall motion abnormality secondary to pacemaker activation.  I spoke with Dr. Sol Passer about pt having surgery <3 months since most recent stroke.  He was unaware  of upcoming surgery and recommended she postpone surgery until further out from stroke.  I reviewed case with Dr. Noreene Larsson. I notified answering service at Dr. Tobin Chad office (office closed) that surgery will need to be postponed until pt cleared by Dr. Sol Passer post-stroke.  Rica Mast, FNP-BC [facility]/Anesthesiology Phone: [phone] 02/12/2017 4:33 PM

## 2017-02-15 ENCOUNTER — Ambulatory Visit (HOSPITAL_COMMUNITY): Admission: RE | Admit: 2017-02-15 | Payer: Medicare HMO | Source: Ambulatory Visit | Admitting: Orthopedic Surgery

## 2017-02-15 HISTORY — DX: Headache: R51

## 2017-02-15 HISTORY — DX: Pneumonia, unspecified organism: J18.9

## 2017-02-15 HISTORY — DX: Other specified postprocedural states: Z98.890

## 2017-02-15 HISTORY — DX: Other specified postprocedural states: R11.2

## 2017-02-15 HISTORY — DX: Presence of cardiac pacemaker: Z95.0

## 2017-02-15 HISTORY — DX: Unspecified osteoarthritis, unspecified site: M19.90

## 2017-02-15 HISTORY — DX: Headache, unspecified: R51.9

## 2017-02-15 HISTORY — DX: Presence of dental prosthetic device (complete) (partial): Z97.2

## 2017-02-15 SURGERY — ARTHROSCOPY, KNEE
Anesthesia: Choice | Laterality: Left

## 2017-03-11 ENCOUNTER — Other Ambulatory Visit: Payer: Self-pay | Admitting: Orthopedic Surgery

## 2017-03-19 NOTE — Pre-Procedure Instructions (Signed)
SOPHELIA LEGETTE  03/19/2017      [id number] - [place], [place] - [address] [facility] [place] [address] Phone: [phone] Fax: [phone]  [facility] - [place], [place] - [address] [place] [address] Phone: [phone] Fax: [phone]    Your procedure is scheduled on May 21.  Report to [facility] Admitting at 800 A.M.  Call this number if you have problems the morning of surgery:  [phone]   Remember:  Do not eat food or drink liquids after midnight.  Take these medicines the morning of surgery with A SIP OF WATER Eye drops if needed Stop taking BC's, goody's, herbal medications, Vitamins, fish Oil, Ibuprofen, Advil, motrin, Aleve  Stop/take Asprin as directed by your Dr.   Drucilla Schmidt not wear jewelry, make-up or nail polish.  Do not wear lotions, powders, or perfumes, or deoderant.  Do not shave 48 hours prior to surgery.  Men may shave face and neck.  Do not bring valuables to the hospital.  [facility] is not responsible for any belongings or valuables.  Contacts, dentures or bridgework may not be worn into surgery.  Leave your suitcase in the car.  After surgery it may be brought to your room.  For patients admitted to the hospital, discharge time will be determined by your treatment team.  Patients discharged the day of surgery will not be allowed to drive home.   Special instructions:  [facility] - Preparing for Surgery  Before surgery, you can play an important role.  Because skin is not sterile, your skin needs to be as free of germs as possible.  You can reduce the number of germs on you skin by washing with CHG (chlorahexidine gluconate) soap before surgery.  CHG is an antiseptic cleaner which kills germs and bonds with the skin to continue killing germs even after washing.  Please DO NOT use if you have an allergy to CHG or antibacterial soaps.  If your skin becomes reddened/irritated stop  using the CHG and inform your nurse when you arrive at Short Stay.  Do not shave (including legs and underarms) for at least 48 hours prior to the first CHG shower.  You may shave your face.  Please follow these instructions carefully:   1.  Shower with CHG Soap the night before surgery and the                                morning of Surgery.  2.  If you choose to wash your hair, wash your hair first as usual with your       normal shampoo.  3.  After you shampoo, rinse your hair and body thoroughly to remove the                      Shampoo.  4.  Use CHG as you would any other liquid soap.  You can apply chg directly       to the skin and wash gently with scrungie or a clean washcloth.  5.  Apply the CHG Soap to your body ONLY FROM THE NECK DOWN.        Do not use on open wounds or open sores.  Avoid contact with your eyes,       ears, mouth and genitals (private parts).  Wash genitals (private parts)  with your normal soap.  6.  Wash thoroughly, paying special attention to the area where your surgery        will be performed.  7.  Thoroughly rinse your body with warm water from the neck down.  8.  DO NOT shower/wash with your normal soap after using and rinsing off       the CHG Soap.  9.  Pat yourself dry with a clean towel.            10.  Wear clean pajamas.            11.  Place clean sheets on your bed the night of your first shower and do not        sleep with pets.  Day of Surgery  Do not apply any lotions/deoderants the morning of surgery.  Please wear clean clothes to the hospital/surgery center.     Please read over the following fact sheets that you were given. Pain Booklet, Coughing and Deep Breathing and Surgical Site Infection Prevention

## 2017-03-22 ENCOUNTER — Encounter (HOSPITAL_COMMUNITY): Payer: Self-pay

## 2017-03-22 ENCOUNTER — Encounter (HOSPITAL_COMMUNITY)
Admission: RE | Admit: 2017-03-22 | Discharge: 2017-03-22 | Disposition: A | Discharge status: Home / Self Care | Payer: Medicare HMO | Source: Ambulatory Visit | Attending: Orthopedic Surgery | Admitting: Orthopedic Surgery

## 2017-03-22 DIAGNOSIS — Z01812 Encounter for preprocedural laboratory examination: Secondary | ICD-10-CM | POA: Insufficient documentation

## 2017-03-22 DIAGNOSIS — Z950 Presence of cardiac pacemaker: Secondary | ICD-10-CM | POA: Insufficient documentation

## 2017-03-22 LAB — BASIC METABOLIC PANEL
ANION GAP: 7 (ref 5–15)
BUN: 17 mg/dL (ref 6–20)
CHLORIDE: 107 mmol/L (ref 101–111)
CO2: 23 mmol/L (ref 22–32)
Calcium: 9.4 mg/dL (ref 8.9–10.3)
Creatinine, Ser: 1.18 mg/dL — ABNORMAL HIGH (ref 0.44–1.00)
GFR calc non Af Amer: 39 mL/min — ABNORMAL LOW (ref >60)
GFR, EST AFRICAN AMERICAN: 46 mL/min — AB (ref >60)
Glucose, Bld: 103 mg/dL — ABNORMAL HIGH (ref 65–99)
POTASSIUM: 3.9 mmol/L (ref 3.5–5.1)
Sodium: 137 mmol/L (ref 135–145)

## 2017-03-22 LAB — CBC
HEMATOCRIT: 41.9 % (ref 36.0–46.0)
HEMOGLOBIN: 13.8 g/dL (ref 12.0–15.0)
MCH: 30.5 pg (ref 26.0–34.0)
MCHC: 32.9 g/dL (ref 30.0–36.0)
MCV: 92.5 fL (ref 78.0–100.0)
Platelets: 185 10*3/uL (ref 150–400)
RBC: 4.53 MIL/uL (ref 3.87–5.11)
RDW: 13.8 % (ref 11.5–15.5)
WBC: 5.3 10*3/uL (ref 4.0–10.5)

## 2017-03-22 LAB — PROTIME-INR
INR: 1.03
PROTHROMBIN TIME: 13.5 s (ref 11.4–15.2)

## 2017-03-22 NOTE — Progress Notes (Addendum)
PCP is Dr [name] Cardiologist is Dr. [name] (also manges her pacemaker) Echo noted from 2016 Stress test from 2017 Card cath noted from 2016 Denies any chest pain, fever,  Shortness of breath or cough Dr Tobin Chad office called message left with Misty Stanley about obtaining clearance notes and instructions about aspirin. Leta Jungling with [employer] informed of upcoming surgery date and time.

## 2017-03-22 NOTE — Progress Notes (Signed)
[name] [name] called and informed of no need to stop taking her aspirin, per Dr Sherlean Foot. Voices understanding.

## 2017-03-23 NOTE — Progress Notes (Signed)
Anesthesia Chart Review:  Pt is a 83 year old female scheduled for L knee arthroscopy on 03/29/2017 with Vickey Huger, M.D.  Surgery was originally scheduled for 02/15/17 but was postponed due to stroke 12/2016.    - PCP is Teressa Lower, MD who cleared pt to have surgery in May 2018 (notes in care everywhere) - Pulmonologist is Asencion Noble, MD - EP cardiologist is Virl Axe, MD who has cleared pt for surgery noting "patient device dependent, should not have pacer issues, but magnet should be available"  PMH includes:  Complete heart block, pacemaker ([employer]), wide-complex tachycardia, HTN, hyperlipidemia, stroke (2013, 01/2016, 12/2016), COPD, pulmonary fibrosis, DVT, MAC (2013), GERD. Ever smoker. BMI 23. S/p R TKA 12/12/12.   Medications include: ASA 399m.    Preoperative labs reviewed.    CXR 12/22/16: Chronic lung changes similar to prior exam. Pacemaker in place and mild cardiomegaly. Calcified mildly tortuous aorta.  EKG 12/22/16: Junctional rhythm. Nonspecific IVCD with LAD. Probable lateral infarct, age indeterminate. Probable anteroseptal infarct, recent  Nuclear stress test 05/24/16 (care everywhere): 1. No reversible ischemia or infarction 2. Normal left ventricular wall motion. 3. Left ventricular ejection fraction 70% 4. Non invasive risk stratification: Low  Echo 02/01/16 (care everywhere): - There is mild (1+) mitral regurgitation. - There is moderate (2+) tricuspid regurgitation. - The left ventricular ejection fraction is normal (55-60%). - The aortic valve is trileaflet. - Pacing lead seen in right atrium. - Grade I mild diastolic dysfunction; abnormal relaxation pattern. - There is a pacemaker lead in the right ventricle. - The left ventricle is grossly normal size. - The left ventricular apex is not well visualized. - There is normal left ventricular wall thickness. - There is an apical wall motion abnormality secondary to pacemaker activation.  If no  changes, I anticipate pt can proceed with surgery as scheduled.   AWilleen Cass FNP-BC [facility]iology Phone: ([phone]/15/2018 10:36 AM

## 2017-03-28 NOTE — H&P (Signed)
[name] [name] MRN:  [id number] DOB/SEX:  05/27/1924/female  CHIEF COMPLAINT:  Painful left Knee  HISTORY: Patient is a 83 y.o. female presented with a history of pain in the left knee. Onset of symptoms was gradual starting a few months ago with gradually worsening course since that time. Patient has been treated conservatively with over-the-counter NSAIDs and activity modification. Patient currently rates pain in the knee at 5 out of 10 with activity. There is pain at night.  PAST MEDICAL HISTORY: Patient Active Problem List   Diagnosis Date Noted  . Chest pain with low risk for cardiac etiology 11/18/2015  . Pacemaker   . Bradycardia with 31 - 40 beats per minute 05/14/2015  . Heart block   . Pain in limb 08/16/2013  . Varicose veins of lower extremities with other complications 25/85/2778  . Edema -lower extremity 06/22/2013  . Syncope 12/15/2012  . Osteoarthritis of right knee 12/12/2012  . History of pulmonary embolism 08/30/2012  . Fatigue 08/19/2012  . Wide-complex tachycardia ([place]) 05/30/2012  . Bronchiectasis, non-tuberculous ([place]) 01/06/2012  . 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 Diverticulosis   . High cholesterol   . Coronary artery disease prior MI   . Hypertension    Past Medical History:  Diagnosis Date  . Arthritis   . Bronchiectasis, non-tuberculous ([place])    a. Followed by Dr. Joya Gaskins.  . Colon polyps   . Complete heart block (HCC)    a. s/p MDT dual chamber PPM   . COPD (chronic obstructive pulmonary disease) ([place])    past hx with pneumonia  . Diverticulosis   . DVT (deep venous thrombosis) (HCC)    Resolved, no anticoagulation currently  . GERD (gastroesophageal reflux disease)   . Headache   . High cholesterol   . Hypertension   . IBS (irritable bowel syndrome)   . 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 MI (myocardial infarction) ?? probably not 1975   a. 1975 Reported MI;  b. 2011 Cath: nonobs dzs;   c. 2013 Nonischemic Myoview;  d. 12/2012 Echo: EF 60-65%, Gr1 DD.  . Nephrolithiasis   . Palpitation    a. 2010 Event Monitor: PACs and PVCs  . Pneumonia   . PONV (postoperative nausea and vomiting)   . Presence of permanent cardiac pacemaker   . Pulmonary fibrosis ([place])   . Stroke ([place])   . Wears dentures    full  . Wide-complex tachycardia ([place])    a. Followed by Dr. Caryl Comes, "I think this is VT, but i am not sure QRSd 135 1:1 AV."   Past Surgical History:  Procedure Laterality Date  . APPENDECTOMY    . bladder tack    . CARDIAC CATHETERIZATION Right 05/14/2015   Procedure: Temporary Pacemaker;  Surgeon: Thompson Grayer, MD;  Location: [facility];  Service: Cardiovascular;  Laterality: Right;  . CHOLECYSTECTOMY    . COLON SURGERY     12inches colectomy s/p diverticulitis  . COLONOSCOPY W/ BIOPSIES AND POLYPECTOMY    . EP IMPLANTABLE DEVICE N/A 05/14/2015   Procedure: Pacemaker Implant;  Surgeon: Thompson Grayer, MD; [place] 1 (serial number [id number])     . MULTIPLE TOOTH EXTRACTIONS    . TONSILLECTOMY    . TOTAL ABDOMINAL HYSTERECTOMY    . TOTAL KNEE ARTHROPLASTY  12/12/2012   Procedure: TOTAL KNEE ARTHROPLASTY;  Surgeon: Alta Corning, MD;  Location: [facility];  Service: Orthopedics;  Laterality: Right;  right total knee arthroplasty  MEDICATIONS:   No prescriptions prior to admission.    ALLERGIES:   Allergies  Allergen Reactions  . Crestor [Rosuvastatin] Other (See Comments)    Myalgia  . Darvon Other (See Comments)    Drop in  BP  . Meloxicam Other (See Comments)    Dizziness   . Meperidine Other (See Comments)    Hypotension  . Prednisone Diarrhea    POLYURIA  . Valsartan Other (See Comments)    Other reaction(s): Hypotension  . Amiodarone     UNSPECIFIED  Did not tolerate - unknown reaction  . Aspirin-Acetaminophen-Caffeine     UNSPECIFIED REACTION   . Tramadol     UNSPECIFIED REACTION     REVIEW OF SYSTEMS:  A comprehensive  review of systems was negative except for: Musculoskeletal: positive for bone pain, myalgias and stiff joints   FAMILY HISTORY:   Family History  Problem Relation Age of Onset  . Heart disease Father   . Heart attack Mother   . Stomach cancer Brother   . Other Son        Triple Bypass    SOCIAL HISTORY:   Social History  Substance Use Topics  . Smoking status: Never Smoker  . Smokeless tobacco: Never Used  . Alcohol use No     EXAMINATION:  Vital signs in last 24 hours:    There were no vitals taken for this visit.  General Appearance:    Alert, cooperative, no distress, appears stated age  Head:    Normocephalic, without obvious abnormality, atraumatic  Eyes:    PERRL, conjunctiva/corneas clear, EOM's intact, fundi    benign, both eyes  Ears:    Normal TM's and external ear canals, both ears  Nose:   Nares normal, septum midline, mucosa normal, no drainage    or sinus tenderness  Throat:   Lips, mucosa, and tongue normal; teeth and gums normal  Neck:   Supple, symmetrical, trachea midline, no adenopathy;    thyroid:  no enlargement/tenderness/nodules; no carotid   bruit or JVD  Back:     Symmetric, no curvature, ROM normal, no CVA tenderness  Lungs:     Clear to auscultation bilaterally, respirations unlabored  Chest Wall:    No tenderness or deformity   Heart:    Regular rate and rhythm, S1 and S2 normal, no murmur, rub   or gallop  Breast Exam:    No tenderness, masses, or nipple abnormality  Abdomen:     Soft, non-tender, bowel sounds active all four quadrants,    no masses, no organomegaly  Genitalia:    Normal female without lesion, discharge or tenderness  Rectal:    Normal tone, no masses or tenderness;   guaiac negative stool  Extremities:   Extremities normal, atraumatic, no cyanosis or edema  Pulses:   2+ and symmetric all extremities  Skin:   Skin color, texture, turgor normal, no rashes or lesions  Lymph nodes:   Cervical, supraclavicular, and axillary  nodes normal  Neurologic:   CNII-XII intact, normal strength, sensation and reflexes    throughout    Musculoskeletal:  ROM 0-120, Ligaments intact, +Mcmurrays,  Imaging Review Plain radiographs demonstrate mild degenerative joint disease of the left knee. The overall alignment is neutral. The bone quality appears to be good for age and reported activity level. Unable to obtain MRI due to patient having aneurysm clips.  Assessment/Plan: Internal derangement, left knee  The patient history, physical examination and imaging studies are consistent with internal deragement  left knee. The patient has failed conservative treatment.  The clearance notes were reviewed.  After discussion with the patient it was felt that diagnostic knee arthroscopy was indicated. The procedure,  risks, and benefits of knee arthroscopy were presented and reviewed. The risks including but not limited to  infection, blood clots, vascular injury, stiffness,  complications among others were discussed. The patient acknowledged the explanation, agreed to proceed with the plan.  Donia Ast 03/28/2017, 10:13 PM

## 2017-03-29 ENCOUNTER — Encounter (HOSPITAL_COMMUNITY)
Admission: RE | Disposition: A | Discharge status: Home / Self Care | Payer: Self-pay | Source: Ambulatory Visit | Attending: Orthopedic Surgery

## 2017-03-29 ENCOUNTER — Ambulatory Visit (HOSPITAL_COMMUNITY): Payer: Medicare HMO | Admitting: Anesthesiology

## 2017-03-29 ENCOUNTER — Encounter (HOSPITAL_COMMUNITY): Payer: Self-pay | Admitting: *Deleted

## 2017-03-29 ENCOUNTER — Ambulatory Visit (HOSPITAL_COMMUNITY)
Admission: RE | Admit: 2017-03-29 | Discharge: 2017-03-29 | Disposition: A | Discharge status: Home / Self Care | Payer: Medicare HMO | Source: Ambulatory Visit | Attending: Orthopedic Surgery | Admitting: Orthopedic Surgery

## 2017-03-29 ENCOUNTER — Ambulatory Visit (HOSPITAL_COMMUNITY): Payer: Medicare HMO | Admitting: Emergency Medicine

## 2017-03-29 DIAGNOSIS — E7800 Pure hypercholesterolemia, unspecified: Secondary | ICD-10-CM | POA: Insufficient documentation

## 2017-03-29 DIAGNOSIS — Z96651 Presence of right artificial knee joint: Secondary | ICD-10-CM | POA: Diagnosis not present

## 2017-03-29 DIAGNOSIS — M23201 Derangement of unspecified lateral meniscus due to old tear or injury, left knee: Secondary | ICD-10-CM | POA: Insufficient documentation

## 2017-03-29 DIAGNOSIS — Z90710 Acquired absence of both cervix and uterus: Secondary | ICD-10-CM | POA: Diagnosis not present

## 2017-03-29 DIAGNOSIS — K589 Irritable bowel syndrome without diarrhea: Secondary | ICD-10-CM | POA: Diagnosis not present

## 2017-03-29 DIAGNOSIS — I2510 Atherosclerotic heart disease of native coronary artery without angina pectoris: Secondary | ICD-10-CM | POA: Insufficient documentation

## 2017-03-29 DIAGNOSIS — Z86711 Personal history of pulmonary embolism: Secondary | ICD-10-CM | POA: Insufficient documentation

## 2017-03-29 DIAGNOSIS — J8410 Pulmonary fibrosis, unspecified: Secondary | ICD-10-CM | POA: Insufficient documentation

## 2017-03-29 DIAGNOSIS — Z8249 Family history of ischemic heart disease and other diseases of the circulatory system: Secondary | ICD-10-CM | POA: Diagnosis not present

## 2017-03-29 DIAGNOSIS — Z9049 Acquired absence of other specified parts of digestive tract: Secondary | ICD-10-CM | POA: Diagnosis not present

## 2017-03-29 DIAGNOSIS — Z8673 Personal history of transient ischemic attack (TIA), and cerebral infarction without residual deficits: Secondary | ICD-10-CM | POA: Diagnosis not present

## 2017-03-29 DIAGNOSIS — Z888 Allergy status to other drugs, medicaments and biological substances status: Secondary | ICD-10-CM | POA: Insufficient documentation

## 2017-03-29 DIAGNOSIS — I252 Old myocardial infarction: Secondary | ICD-10-CM | POA: Diagnosis not present

## 2017-03-29 DIAGNOSIS — Z9889 Other specified postprocedural states: Secondary | ICD-10-CM | POA: Insufficient documentation

## 2017-03-29 DIAGNOSIS — Z800 Family history of malignant neoplasm of digestive organs: Secondary | ICD-10-CM | POA: Diagnosis not present

## 2017-03-29 DIAGNOSIS — J449 Chronic obstructive pulmonary disease, unspecified: Secondary | ICD-10-CM | POA: Insufficient documentation

## 2017-03-29 DIAGNOSIS — Z86010 Personal history of colonic polyps: Secondary | ICD-10-CM | POA: Diagnosis not present

## 2017-03-29 DIAGNOSIS — Z950 Presence of cardiac pacemaker: Secondary | ICD-10-CM | POA: Insufficient documentation

## 2017-03-29 DIAGNOSIS — Z886 Allergy status to analgesic agent status: Secondary | ICD-10-CM | POA: Insufficient documentation

## 2017-03-29 DIAGNOSIS — K219 Gastro-esophageal reflux disease without esophagitis: Secondary | ICD-10-CM | POA: Insufficient documentation

## 2017-03-29 DIAGNOSIS — I10 Essential (primary) hypertension: Secondary | ICD-10-CM | POA: Diagnosis not present

## 2017-03-29 HISTORY — PX: KNEE ARTHROSCOPY: SHX127

## 2017-03-29 SURGERY — ARTHROSCOPY, KNEE
Anesthesia: General | Site: Knee | Laterality: Left

## 2017-03-29 MED ORDER — FENTANYL CITRATE (PF) 100 MCG/2ML IJ SOLN
INTRAMUSCULAR | Status: AC
  Administered 2017-03-29: 25 ug via INTRAVENOUS
  Filled 2017-03-29: qty 2

## 2017-03-29 MED ORDER — FENTANYL CITRATE (PF) 100 MCG/2ML IJ SOLN
25.0000 ug | INTRAMUSCULAR | Status: DC | PRN
  Administered 2017-03-29: 25 ug via INTRAVENOUS

## 2017-03-29 MED ORDER — CHLORHEXIDINE GLUCONATE 4 % EX LIQD: 60.0000 mL | Freq: Once | CUTANEOUS | Status: DC

## 2017-03-29 MED ORDER — FENTANYL CITRATE (PF) 100 MCG/2ML IJ SOLN
INTRAMUSCULAR | Status: DC | PRN
  Administered 2017-03-29 (×4): 25 ug via INTRAVENOUS

## 2017-03-29 MED ORDER — LACTATED RINGERS IV SOLN
INTRAVENOUS | Status: DC
  Administered 2017-03-29: 10:00:00 via INTRAVENOUS

## 2017-03-29 MED ORDER — PROPOFOL 10 MG/ML IV BOLUS
INTRAVENOUS | Status: DC | PRN
  Administered 2017-03-29: 150 mg via INTRAVENOUS

## 2017-03-29 MED ORDER — LACTATED RINGERS IV SOLN
INTRAVENOUS | Status: DC | PRN
  Administered 2017-03-29: 12:00:00 via INTRAVENOUS

## 2017-03-29 MED ORDER — HYDROCODONE-ACETAMINOPHEN 5-325 MG PO TABS
ORAL_TABLET | ORAL | Status: AC
  Filled 2017-03-29: qty 1

## 2017-03-29 MED ORDER — HYDROCODONE-ACETAMINOPHEN 5-325 MG PO TABS
1.0000 | ORAL_TABLET | Freq: Once | ORAL | Status: AC
  Administered 2017-03-29: 1 via ORAL

## 2017-03-29 MED ORDER — SODIUM CHLORIDE 0.9 % IR SOLN
Status: DC | PRN
  Administered 2017-03-29: 3000 mL

## 2017-03-29 MED ORDER — FENTANYL CITRATE (PF) 250 MCG/5ML IJ SOLN
INTRAMUSCULAR | Status: AC
  Filled 2017-03-29: qty 5

## 2017-03-29 MED ORDER — PROMETHAZINE HCL 25 MG/ML IJ SOLN: 6.2500 mg | INTRAMUSCULAR | Status: DC | PRN

## 2017-03-29 MED ORDER — ONDANSETRON HCL 4 MG/2ML IJ SOLN
INTRAMUSCULAR | Status: DC | PRN
  Administered 2017-03-29: 4 mg via INTRAVENOUS

## 2017-03-29 SURGICAL SUPPLY — 32 items
BANDAGE ACE 6X5 VEL STRL LF (GAUZE/BANDAGES/DRESSINGS) ×2 IMPLANT
BLADE GREAT WHITE 4.2 (BLADE) ×2 IMPLANT
DRAPE ARTHROSCOPY W/POUCH 114 (DRAPES) ×2 IMPLANT
DRAPE U-SHAPE 47X51 STRL (DRAPES) ×2 IMPLANT
ELECT REM PT RETURN 9FT ADLT (ELECTROSURGICAL)
ELECTRODE REM PT RTRN 9FT ADLT (ELECTROSURGICAL) IMPLANT
GAUZE SPONGE 4X4 12PLY STRL (GAUZE/BANDAGES/DRESSINGS) ×2 IMPLANT
GAUZE SPONGE 4X4 12PLY STRL LF (GAUZE/BANDAGES/DRESSINGS) ×2 IMPLANT
GAUZE XEROFORM 1X8 LF (GAUZE/BANDAGES/DRESSINGS) ×2 IMPLANT
GLOVE BIOGEL PI IND STRL 8.5 (GLOVE) ×5 IMPLANT
GLOVE BIOGEL PI INDICATOR 8.5 (GLOVE) ×5
GLOVE SURG ORTHO 8.0 STRL STRW (GLOVE) ×12 IMPLANT
GOWN STRL REUS W/ TWL LRG LVL3 (GOWN DISPOSABLE) ×1 IMPLANT
GOWN STRL REUS W/TWL 2XL LVL3 (GOWN DISPOSABLE) ×2 IMPLANT
GOWN STRL REUS W/TWL LRG LVL3 (GOWN DISPOSABLE) ×1
GOWN STRL REUS W/TWL XL LVL3 (GOWN DISPOSABLE) ×4 IMPLANT
KIT ROOM TURNOVER OR (KITS) ×2 IMPLANT
MANIFOLD NEPTUNE II (INSTRUMENTS) ×2 IMPLANT
PACK ARTHROSCOPY DSU (CUSTOM PROCEDURE TRAY) ×2 IMPLANT
PAD ARMBOARD 7.5X6 YLW CONV (MISCELLANEOUS) ×4 IMPLANT
PADDING CAST ABS 6INX4YD NS (CAST SUPPLIES) ×1
PADDING CAST ABS COTTON 6X4 NS (CAST SUPPLIES) ×1 IMPLANT
PENCIL BUTTON HOLSTER BLD 10FT (ELECTRODE) IMPLANT
SET ARTHROSCOPY TUBING (MISCELLANEOUS) ×1
SET ARTHROSCOPY TUBING LN (MISCELLANEOUS) ×1 IMPLANT
SPONGE LAP 4X18 X RAY DECT (DISPOSABLE) ×2 IMPLANT
SUT ETHILON 4 0 PS 2 18 (SUTURE) ×2 IMPLANT
SYR 30ML LL (SYRINGE) ×4 IMPLANT
TOWEL OR 17X24 6PK STRL BLUE (TOWEL DISPOSABLE) ×2 IMPLANT
TOWEL OR 17X26 10 PK STRL BLUE (TOWEL DISPOSABLE) ×2 IMPLANT
WAND HAND CNTRL MULTIVAC 90 (MISCELLANEOUS) IMPLANT
WATER STERILE IRR 1000ML POUR (IV SOLUTION) ×2 IMPLANT

## 2017-03-29 NOTE — Anesthesia Procedure Notes (Signed)
Procedure Name: LMA Insertion Date/Time: 03/29/2017 12:32 PM Performed by: Wray Kearns A Pre-anesthesia Checklist: Patient identified, Emergency Drugs available, Suction available and Patient being monitored Patient Re-evaluated:Patient Re-evaluated prior to inductionOxygen Delivery Method: Circle System Utilized and Circle system utilized Preoxygenation: Pre-oxygenation with 100% oxygen Intubation Type: IV induction Ventilation: Mask ventilation without difficulty LMA: LMA inserted LMA Size: 4.0 Tube type: Oral Number of attempts: 1 Placement Confirmation: positive ETCO2 Tube secured with: Tape Dental Injury: Teeth and Oropharynx as per pre-operative assessment

## 2017-03-29 NOTE — Transfer of Care (Signed)
Immediate Anesthesia Transfer of Care Note  Patient: [name] [name]  Procedure(s) Performed: Procedure(s): ARTHROSCOPY LEFT KNEE WITH PARTIAL MEDIAL AND PARTIAL LATERAL MENISCECTOMY (Left)  Patient Location: PACU  Anesthesia Type:General  Level of Consciousness: awake, oriented, sedated, patient cooperative and responds to stimulation  Airway & Oxygen Therapy: Patient Spontanous Breathing and Patient connected to nasal cannula oxygen  Post-op Assessment: Report given to RN, Post -op Vital signs reviewed and stable, Patient moving all extremities and Patient moving all extremities X 4  Post vital signs: Reviewed and stable  Last Vitals:  Vitals:   03/29/17 0851 03/29/17 0933  BP: (!) 203/83 (!) 192/63  Pulse: 67   Resp: 18   Temp: 36.6 C     Last Pain:  Vitals:   03/29/17 0851  TempSrc: Oral      Patients Stated Pain Goal: 8 (03/29/17 0933)  Complications: No apparent anesthesia complications

## 2017-03-29 NOTE — Anesthesia Postprocedure Evaluation (Signed)
Anesthesia Post Note  Patient: [name] [name]  Procedure(s) Performed: Procedure(s) (LRB): ARTHROSCOPY LEFT KNEE WITH PARTIAL MEDIAL AND PARTIAL LATERAL MENISCECTOMY (Left)  Patient location during evaluation: PACU Anesthesia Type: General Level of consciousness: awake and alert Pain management: pain level controlled Vital Signs Assessment: post-procedure vital signs reviewed and stable Respiratory status: spontaneous breathing, nonlabored ventilation, respiratory function stable and patient connected to nasal cannula oxygen Cardiovascular status: blood pressure returned to baseline and stable Postop Assessment: no signs of nausea or vomiting Anesthetic complications: no       Last Vitals:  Vitals:   03/29/17 1400 03/29/17 1410  BP: (!) 165/74 (!) 165/74  Pulse: 60 60  Resp: 16 15  Temp:  36.3 C    Last Pain:  Vitals:   03/29/17 1410  TempSrc:   PainSc: 0-No pain                 Kennieth Rad

## 2017-03-29 NOTE — Anesthesia Preprocedure Evaluation (Signed)
Anesthesia Evaluation  Patient identified by MRN, date of birth, ID band Patient awake    Reviewed: Allergy & Precautions, H&P , NPO status , Patient's Chart, lab work & pertinent test results  Airway Mallampati: II  TM Distance: >3 FB Neck ROM: Full    Dental  (+) Edentulous Upper, Poor Dentition, Dental Advisory Given   Pulmonary COPD,    Pulmonary exam normal        Cardiovascular hypertension, Pt. on medications + CAD, + Past MI and + Peripheral Vascular Disease  Normal cardiovascular exam+ dysrhythmias Supra Ventricular Tachycardia + pacemaker      Neuro/Psych CVA, No Residual Symptoms negative psych ROS   GI/Hepatic Neg liver ROS, GERD  Medicated,  Endo/Other  negative endocrine ROS  Renal/GU Renal InsufficiencyRenal disease     Musculoskeletal   Abdominal   Peds  Hematology   Anesthesia Other Findings   Reproductive/Obstetrics                             Anesthesia Physical  Anesthesia Plan  ASA: III  Anesthesia Plan: General   Post-op Pain Management:    Induction: Intravenous  Airway Management Planned: LMA  Additional Equipment:   Intra-op Plan:   Post-operative Plan: Extubation in OR  Informed Consent: I have reviewed the patients History and Physical, chart, labs and discussed the procedure including the risks, benefits and alternatives for the proposed anesthesia with the patient or authorized representative who has indicated his/her understanding and acceptance.   Dental advisory given  Plan Discussed with: CRNA, Anesthesiologist and Surgeon  Anesthesia Plan Comments:         Anesthesia Quick Evaluation

## 2017-03-30 ENCOUNTER — Encounter (HOSPITAL_COMMUNITY): Payer: Self-pay | Admitting: Orthopedic Surgery

## 2017-03-30 NOTE — Op Note (Signed)
Dictation Number: [id number]

## 2017-03-31 NOTE — Op Note (Signed)
NAME:  [name] [name], [name] [name] NO.:  MEDICAL RECORD NO.:  [id number]  LOCATION:                                 FACILITY:  PHYSICIAN:  Mila Homer. Sherlean Foot, M.D.      DATE OF BIRTH:  DATE OF PROCEDURE:  03/29/2017 DATE OF DISCHARGE:                              OPERATIVE REPORT   PREOPERATIVE DIAGNOSIS:  Left knee meniscus tear.  POSTOPERATIVE DIAGNOSIS:  Left knee meniscus tear.  PROCEDURE:  Left knee arthroscopy with partial medial and partial lateral meniscectomies.  INDICATION FOR PROCEDURE:  The patient is a 83 year old white female, who was unable to have an MRI scan, but has had mechanical symptoms and informed consent was obtained for a diagnostic left knee arthroscopy with suspicion of meniscus tearing.  DESCRIPTION OF PROCEDURE:  The patient was laid supine, administered general anesthesia.  The left knee prepped and draped in usual fashion. Inferolateral and inferomedial portals were created with a #11 blade, blunt trocar and cannula.  Diagnostic arthroscopy revealed grade 2 chondromalacia in the patellofemoral joint, grade 2 in the medial joint and lateral joint as well.  There was lot of chondrocalcinosis in both compartments.  There was small radial tearing of the midbody of the medial meniscus.  Straight basket forceps and great White shaver were used to perform a partial medial meniscectomy removing approximately 10% of the medial meniscus.  The ACL and PCL appeared normal.  I went into the figure 4 position.  There was a large midbody lateral meniscus tear. I used straight and up-biting basket forceps and great White shaver to be used to perform a partial lateral meniscectomy removing approximately 30% of the lateral meniscus.  Again, for her age, I felt the osteoarthritis was very acceptable and did not feel this was the source of her pain, but it was the meniscus tearing.  I then lavaged and closed with 4-0 nylon sutures.  Dressed with Xeroform  dressing, sponges, sterile Webril, and an Ace wrap.  COMPLICATIONS:  None.  DRAINS:  None.          ______________________________ Mila Homer. Sherlean Foot, M.D.     SDL/MEDQ  D:  03/30/2017  T:  03/30/2017  Job:  [id number]

## 2017-04-01 DIAGNOSIS — Z9889 Other specified postprocedural states: Secondary | ICD-10-CM

## 2017-04-01 HISTORY — DX: Other specified postprocedural states: Z98.890

## 2017-04-29 ENCOUNTER — Telehealth: Payer: Self-pay | Admitting: Cardiology

## 2017-04-29 ENCOUNTER — Encounter: Payer: Medicare HMO | Admitting: *Deleted

## 2017-04-29 NOTE — Telephone Encounter (Signed)
Spoke with pt and reminded pt of remote transmission that is due today. Pt verbalized understanding.   

## 2017-04-30 ENCOUNTER — Encounter: Payer: Self-pay | Admitting: Cardiology

## 2017-05-11 ENCOUNTER — Ambulatory Visit (INDEPENDENT_AMBULATORY_CARE_PROVIDER_SITE_OTHER): Payer: Medicare HMO | Admitting: *Deleted

## 2017-05-11 DIAGNOSIS — I495 Sick sinus syndrome: Secondary | ICD-10-CM | POA: Diagnosis not present

## 2017-05-11 NOTE — Progress Notes (Signed)
Remote pacemaker transmission.   

## 2017-05-13 LAB — CUP PACEART REMOTE DEVICE CHECK
Battery Impedance: 157 Ohm
Battery Remaining Longevity: 117 mo
Battery Voltage: 2.79 V
Brady Statistic AP VS Percent: 0 %
Implantable Lead Implant Date: 20160705
Implantable Lead Implant Date: 20160705
Implantable Lead Location: 753859
Implantable Lead Model: 5076
Implantable Pulse Generator Implant Date: 20160705
Lead Channel Impedance Value: 508 Ohm
Lead Channel Pacing Threshold Amplitude: 0.5 V
Lead Channel Pacing Threshold Pulse Width: 0.4 ms
Lead Channel Pacing Threshold Pulse Width: 0.4 ms
Lead Channel Setting Pacing Amplitude: 2.5 V
Lead Channel Setting Pacing Pulse Width: 0.4 ms
Lead Channel Setting Sensing Sensitivity: 4 mV
MDC IDC LEAD LOCATION: 753860
MDC IDC MSMT LEADCHNL RA SENSING INTR AMPL: 2.8 mV
MDC IDC MSMT LEADCHNL RV IMPEDANCE VALUE: 515 Ohm
MDC IDC MSMT LEADCHNL RV PACING THRESHOLD AMPLITUDE: 0.375 V
MDC IDC SESS DTM: 20180703080509
MDC IDC SET LEADCHNL RA PACING AMPLITUDE: 2 V
MDC IDC STAT BRADY AP VP PERCENT: 53 %
MDC IDC STAT BRADY AS VP PERCENT: 47 %
MDC IDC STAT BRADY AS VS PERCENT: 0 %

## 2017-05-14 ENCOUNTER — Encounter: Payer: Self-pay | Admitting: Cardiology

## 2017-05-14 ENCOUNTER — Telehealth: Payer: Self-pay

## 2017-05-14 MED ORDER — ASPIRIN EC 81 MG PO TBEC: 81.0000 mg | DELAYED_RELEASE_TABLET | Freq: Every day | ORAL | 3 refills | Status: DC

## 2017-05-14 NOTE — Telephone Encounter (Signed)
Spoke with patient who reports taking 325mg  of aspirin daily. Per SK I explained that patient should stop taking the 325mg  of Aspirin and start taking the 81mg  of Aspirin. Patient verbalzied understanding. Prescription sent to pharmacy at patient request.

## 2017-06-07 DIAGNOSIS — M79672 Pain in left foot: Secondary | ICD-10-CM

## 2017-06-07 DIAGNOSIS — M2042 Other hammer toe(s) (acquired), left foot: Secondary | ICD-10-CM

## 2017-06-07 HISTORY — DX: Other hammer toe(s) (acquired), left foot: M20.42

## 2017-06-07 HISTORY — DX: Pain in left foot: M79.672

## 2017-06-17 DIAGNOSIS — M898X5 Other specified disorders of bone, thigh: Secondary | ICD-10-CM

## 2017-06-17 HISTORY — DX: Other specified disorders of bone, thigh: M89.8X5

## 2017-06-18 DIAGNOSIS — S32592A Other specified fracture of left pubis, initial encounter for closed fracture: Secondary | ICD-10-CM | POA: Insufficient documentation

## 2017-06-18 HISTORY — DX: Other specified fracture of left pubis, initial encounter for closed fracture: S32.592A

## 2017-06-27 ENCOUNTER — Encounter (HOSPITAL_COMMUNITY): Payer: Self-pay | Admitting: *Deleted

## 2017-06-27 ENCOUNTER — Emergency Department (HOSPITAL_COMMUNITY)
Admission: EM | Admit: 2017-06-27 | Discharge: 2017-06-27 | Disposition: A | Discharge status: Home / Self Care | Payer: Medicare HMO | Attending: Emergency Medicine | Admitting: Emergency Medicine

## 2017-06-27 ENCOUNTER — Emergency Department (HOSPITAL_COMMUNITY): Payer: Medicare HMO

## 2017-06-27 ENCOUNTER — Other Ambulatory Visit: Payer: Self-pay

## 2017-06-27 DIAGNOSIS — I252 Old myocardial infarction: Secondary | ICD-10-CM | POA: Diagnosis not present

## 2017-06-27 DIAGNOSIS — R531 Weakness: Secondary | ICD-10-CM | POA: Diagnosis present

## 2017-06-27 DIAGNOSIS — I10 Essential (primary) hypertension: Secondary | ICD-10-CM | POA: Insufficient documentation

## 2017-06-27 DIAGNOSIS — I442 Atrioventricular block, complete: Secondary | ICD-10-CM | POA: Diagnosis not present

## 2017-06-27 DIAGNOSIS — Z8673 Personal history of transient ischemic attack (TIA), and cerebral infarction without residual deficits: Secondary | ICD-10-CM | POA: Insufficient documentation

## 2017-06-27 DIAGNOSIS — J449 Chronic obstructive pulmonary disease, unspecified: Secondary | ICD-10-CM | POA: Diagnosis not present

## 2017-06-27 DIAGNOSIS — Z86718 Personal history of other venous thrombosis and embolism: Secondary | ICD-10-CM | POA: Insufficient documentation

## 2017-06-27 DIAGNOSIS — E7800 Pure hypercholesterolemia, unspecified: Secondary | ICD-10-CM | POA: Insufficient documentation

## 2017-06-27 DIAGNOSIS — R079 Chest pain, unspecified: Secondary | ICD-10-CM | POA: Diagnosis not present

## 2017-06-27 DIAGNOSIS — Z7982 Long term (current) use of aspirin: Secondary | ICD-10-CM | POA: Insufficient documentation

## 2017-06-27 LAB — BASIC METABOLIC PANEL
ANION GAP: 8 (ref 5–15)
BUN: 15 mg/dL (ref 6–20)
CALCIUM: 9.8 mg/dL (ref 8.9–10.3)
CHLORIDE: 105 mmol/L (ref 101–111)
CO2: 26 mmol/L (ref 22–32)
Creatinine, Ser: 1.2 mg/dL — ABNORMAL HIGH (ref 0.44–1.00)
GFR calc Af Amer: 44 mL/min — ABNORMAL LOW (ref >60)
GFR calc non Af Amer: 38 mL/min — ABNORMAL LOW (ref >60)
Glucose, Bld: 100 mg/dL — ABNORMAL HIGH (ref 65–99)
Potassium: 4.3 mmol/L (ref 3.5–5.1)
SODIUM: 139 mmol/L (ref 135–145)

## 2017-06-27 LAB — CBC
HCT: 43.9 % (ref 36.0–46.0)
Hemoglobin: 14.9 g/dL (ref 12.0–15.0)
MCH: 30.5 pg (ref 26.0–34.0)
MCHC: 33.9 g/dL (ref 30.0–36.0)
MCV: 89.8 fL (ref 78.0–100.0)
PLATELETS: 177 10*3/uL (ref 150–400)
RBC: 4.89 MIL/uL (ref 3.87–5.11)
RDW: 13.3 % (ref 11.5–15.5)
WBC: 6.2 10*3/uL (ref 4.0–10.5)

## 2017-06-27 LAB — URINALYSIS, ROUTINE W REFLEX MICROSCOPIC
Bilirubin Urine: NEGATIVE
GLUCOSE, UA: NEGATIVE mg/dL
Ketones, ur: NEGATIVE mg/dL
NITRITE: NEGATIVE
PROTEIN: NEGATIVE mg/dL
Specific Gravity, Urine: 1.009 (ref 1.005–1.030)
pH: 6 (ref 5.0–8.0)

## 2017-06-27 LAB — D-DIMER, QUANTITATIVE: D-Dimer, Quant: 1.75 ug/mL-FEU — ABNORMAL HIGH (ref 0.00–0.50)

## 2017-06-27 LAB — I-STAT TROPONIN, ED: TROPONIN I, POC: 0.01 ng/mL (ref 0.00–0.08)

## 2017-06-27 MED ORDER — IOPAMIDOL (ISOVUE-370) INJECTION 76%
80.0000 mL | Freq: Once | INTRAVENOUS | Status: AC | PRN
  Administered 2017-06-27: 80 mL via INTRAVENOUS

## 2017-06-27 NOTE — ED Notes (Signed)
Notified CT IV team obtain IV.

## 2017-06-27 NOTE — ED Provider Notes (Signed)
Patient seen and evaluated. Pt discussed with PA. In complaint is weakness. States at times she will feel breathless. Symptoms ongoing for 1 week. Has some chronic pain in her left leg and some chronic swelling that she relates to her previous CVA. She walks with a system a cane but is ambulatory and not likely bedridden. No history of DVT or PE. No frank pleuritic chest pain. No fevers chills or cough. No fall or injury.  As a paced rhythm. Interrogation of pacer shows no evidence. Agree with CT imaging as patient has has left lower extremity edema to her foot. A CT negative would be appropriate for discharge. Has negative troponin and no symptoms to suggest ACS.   Rolland Porter, MD 06/27/17 [phone]

## 2017-06-27 NOTE — ED Provider Notes (Signed)
[facility] DEPT Provider Note   CSN: [id number] Arrival date & time: 06/27/17  1042     History   Chief Complaint Chief Complaint  Patient presents with  . Weakness    HPI [name] [name] is a 83 y.o. female.  HPI 83 year old Caucasian female past medical history significant for heart block status post pacemaker, COPD, hypertension, history of DVT not currently anticoagulated presents to the emergency department today with complaints of generalized weakness and fatigue. Patient states over the past one week she has felt generalized fatigue and weakness. She thought this was related to her age. She also complains of chest heaviness that has come and gone over the past week that is associated with some shortness of breath. Not associated with exertion. Substernal chest pain that does not radiate. Not pleuritic in nature. Describes it as a heaviness. Patient states that "she feels exhausted when she walks". She denies feeling any palpitations.Patient lives at home with her husband. Uses a walker at baseline. Patient denies any specific infectious complaints including fever, chills, cough, urinary symptoms, diarrhea, neck pain. Patient reports a mild intermittent headache that self resolves over the past week.  Pt denies any fever, chill, vision changes, lightheadedness, dizziness, congestion, neck pain, cough, abd pain, n/v/d, urinary symptoms, change in bowel habits, melena, hematochezia, lower extremity paresthesias.  Past Medical History:  Diagnosis Date  . Arthritis   . Bronchiectasis, non-tuberculous ([place])    a. Followed by Dr. Joya Gaskins.  . Colon polyps   . Complete heart block (HCC)    a. s/p MDT dual chamber PPM   . COPD (chronic obstructive pulmonary disease) ([place])    past hx with pneumonia  . Diverticulosis   . DVT (deep venous thrombosis) (HCC)    Resolved, no anticoagulation currently  . GERD (gastroesophageal reflux disease)   . Headache   . High cholesterol   .  Hypertension   . IBS (irritable bowel syndrome)   . 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 MI (myocardial infarction) ?? probably not 1975   a. 1975 Reported MI;  b. 2011 Cath: nonobs dzs;  c. 2013 Nonischemic Myoview;  d. 12/2012 Echo: EF 60-65%, Gr1 DD.  . Nephrolithiasis   . Palpitation    a. 2010 Event Monitor: PACs and PVCs  . Pneumonia   . PONV (postoperative nausea and vomiting)   . Presence of permanent cardiac pacemaker   . Pulmonary fibrosis ([place])   . Stroke ([place])   . Wears dentures    full  . Wide-complex tachycardia ([place])    a. Followed by Dr. Caryl Comes, "I think this is VT, but i am not sure QRSd 135 1:1 AV."    Patient Active Problem List   Diagnosis Date Noted  . Chest pain with low risk for cardiac etiology 11/18/2015  . Pacemaker   . Bradycardia with 31 - 40 beats per minute 05/14/2015  . Heart block   . Pain in limb 08/16/2013  . Varicose veins of lower extremities with other complications 09/05/2535  . Edema -lower extremity 06/22/2013  . Syncope 12/15/2012  . Osteoarthritis of right knee 12/12/2012  . History of pulmonary embolism 08/30/2012  . Fatigue 08/19/2012  . Wide-complex tachycardia ([place]) 05/30/2012  . Bronchiectasis, non-tuberculous ([place]) 01/06/2012  . 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 Diverticulosis   . High cholesterol   . Coronary artery disease prior MI   . Hypertension     Past Surgical History:  Procedure Laterality Date  . APPENDECTOMY    .  bladder tack    . CARDIAC CATHETERIZATION Right 05/14/2015   Procedure: Temporary Pacemaker;  Surgeon: Thompson Grayer, MD;  Location: [facility];  Service: Cardiovascular;  Laterality: Right;  . CHOLECYSTECTOMY    . COLON SURGERY     12inches colectomy s/p diverticulitis  . COLONOSCOPY W/ BIOPSIES AND POLYPECTOMY    . EP IMPLANTABLE DEVICE N/A 05/14/2015   Procedure: Pacemaker Implant;  Surgeon: Thompson Grayer, MD; [place] 1  (serial number [id number])     . KNEE ARTHROSCOPY Left 03/29/2017   Procedure: ARTHROSCOPY LEFT KNEE WITH PARTIAL MEDIAL AND PARTIAL LATERAL MENISCECTOMY;  Surgeon: Vickey Huger, MD;  Location: [facility];  Service: Orthopedics;  Laterality: Left;  Marland Kitchen MULTIPLE TOOTH EXTRACTIONS    . TONSILLECTOMY    . TOTAL ABDOMINAL HYSTERECTOMY    . TOTAL KNEE ARTHROPLASTY  12/12/2012   Procedure: TOTAL KNEE ARTHROPLASTY;  Surgeon: Alta Corning, MD;  Location: [facility];  Service: Orthopedics;  Laterality: Right;  right total knee arthroplasty    OB History    No data available       Home Medications    Prior to Admission medications   Medication Sig Start Date End Date Taking? Authorizing Provider  aspirin EC 81 MG tablet Take 1 tablet (81 mg total) by mouth daily. 05/14/17   Deboraha Sprang, MD  Carboxymethylcellul-Glycerin (LUBRICATING EYE DROPS OP) Apply 1 drop to eye as needed (irritation).    [provider]  Respiratory Therapy Supplies (FLUTTER) DEVI as directed. Exercise lungs three to four (3-4) times a day as needed for flutter    [provider]  Simethicone (GAS-X PO) Take 1 tablet by mouth as needed (gas).    [provider]  vitamin B-12 (CYANOCOBALAMIN) 1000 MCG tablet Take 1,000 mcg by mouth daily.    [provider]    Family History Family History  Problem Relation Age of Onset  . Heart disease Father   . Heart attack Mother   . Stomach cancer Brother   . Other Son        Triple Bypass    Social History Social History  Substance Use Topics  . Smoking status: Never Smoker  . Smokeless tobacco: Never Used  . Alcohol use No     Allergies   Crestor [rosuvastatin]; Darvon; Meloxicam; Meperidine; Prednisone; Valsartan; Amiodarone; Aspirin-acetaminophen-caffeine; and Tramadol   Review of Systems Review of Systems  Constitutional: Positive for fatigue. Negative for chills, fever and unexpected weight change.  HENT: Negative for congestion.     Eyes: Negative for visual disturbance.  Respiratory: Positive for shortness of breath. Negative for cough.   Cardiovascular: Positive for chest pain.  Gastrointestinal: Negative for abdominal pain, diarrhea, nausea and vomiting.  Genitourinary: Negative for dysuria, flank pain, frequency, hematuria, urgency, vaginal bleeding and vaginal discharge.  Musculoskeletal: Negative for arthralgias and myalgias.  Skin: Negative for rash.  Neurological: Positive for weakness. Negative for dizziness, syncope, light-headedness, numbness and headaches.  Psychiatric/Behavioral: Negative for sleep disturbance. The patient is not nervous/anxious.      Physical Exam Updated Vital Signs BP (!) 180/70   Pulse 60   Resp 16   SpO2 97%   Physical Exam  Constitutional: She is oriented to person, place, and time. She appears well-developed and well-nourished.  Non-toxic appearance. No distress.  HENT:  Head: Normocephalic and atraumatic.  Nose: Nose normal.  Mouth/Throat: Oropharynx is clear and moist.  Eyes: Pupils are equal, round, and reactive to light.  Conjunctivae are normal. Right eye exhibits no discharge. Left eye exhibits no discharge.  Neck: Normal range of motion. Neck supple. No JVD present. No tracheal deviation present.  Cardiovascular: Normal rate, regular rhythm, normal heart sounds and intact distal pulses.  Exam reveals no gallop and no friction rub.   No murmur heard. Pulmonary/Chest: Effort normal and breath sounds normal. No respiratory distress. She has no wheezes. She has no rales. She exhibits no tenderness.  No hypoxia or tachypnea.  Abdominal: Soft. Bowel sounds are normal. She exhibits no distension. There is no tenderness. There is no rebound and no guarding.  Musculoskeletal: Normal range of motion.  No lower extremity edema or calf tenderness.  Lymphadenopathy:    She has no cervical adenopathy.  Neurological: She is alert and oriented to person, place, and time.  The  patient is alert, attentive, and oriented x 3. Speech is clear. Cranial nerve II-VII grossly intact. Negative pronator drift. Sensation intact. Strength 5/5 in all extremities. Reflexes 2+ and symmetric at biceps, triceps, knees, and ankles. Rapid alternating movement and fine finger movements intact. Romberg is absent. Posture and gait normal.   Skin: Skin is warm and dry. Capillary refill takes less than 2 seconds. She is not diaphoretic.  Psychiatric: Her behavior is normal. Judgment and thought content normal.  Nursing note and vitals reviewed.    ED Treatments / Results  Labs (all labs ordered are listed, but only abnormal results are displayed) Labs Reviewed  URINE CULTURE - Abnormal; Notable for the following:       Result Value   Culture MULTIPLE SPECIES PRESENT, SUGGEST RECOLLECTION (*)    All other components within normal limits  BASIC METABOLIC PANEL - Abnormal; Notable for the following:    Glucose, Bld 100 (*)    Creatinine, Ser 1.20 (*)    GFR calc non Af Amer 38 (*)    GFR calc Af Amer 44 (*)    All other components within normal limits  URINALYSIS, ROUTINE W REFLEX MICROSCOPIC - Abnormal; Notable for the following:    Hgb urine dipstick SMALL (*)    Leukocytes, UA LARGE (*)    Bacteria, UA RARE (*)    Squamous Epithelial / LPF 6-30 (*)    All other components within normal limits  D-DIMER, QUANTITATIVE (NOT AT [facility]) - Abnormal; Notable for the following:    D-Dimer, Quant 1.75 (*)    All other components within normal limits  CBC  I-STAT TROPONIN, ED    EKG  EKG Interpretation  Date/Time:  Sunday June 27 2017 12:52:22 EDT Ventricular Rate:  65 PR Interval:    QRS Duration: 152 QT Interval:  439 QTC Calculation: 600 R Axis:   -98 Text Interpretation:  A-V dual-paced complexes w/ some inhibition No further analysis attempted due to paced rhythm Confirmed by Tanna Furry [phone]) on 06/27/2017 12:57:37 PM       Radiology Dg Chest 2 View  Result Date:  06/27/2017 CLINICAL DATA:  General weakness. Chest heaviness and shortness of breath since yesterday. EXAM: CHEST  2 VIEW COMPARISON:  12/22/2016 FINDINGS: The lungs are hyperinflated likely secondary to COPD. There is no focal parenchymal opacity. There is no pleural effusion or pneumothorax. The heart and mediastinal contours are unremarkable. There is a dual lead cardiac pacemaker.The osseous structures are unremarkable. IMPRESSION: No active cardiopulmonary disease. Electronically Signed   By: Kathreen Devoid   On: 06/27/2017 11:54   Ct Angio Chest Pe W/cm &/or Wo Cm  Result Date: 06/27/2017  CLINICAL DATA:  Chest pain and shortness of breath EXAM: CT ANGIOGRAPHY CHEST WITH CONTRAST TECHNIQUE: Multidetector CT imaging of the chest was performed using the standard protocol during bolus administration of intravenous contrast. Multiplanar CT image reconstructions and MIPs were obtained to evaluate the vascular anatomy. CONTRAST:  80 mL Isovue 370 nonionic COMPARISON:  CT angiogram chest November 18, 2015; chest radiograph June 27, 2017 FINDINGS: Cardiovascular: There is no demonstrable pulmonary embolus. There is no thoracic aortic aneurysm or dissection. There is mild calcification in the visualized great vessels. There are foci of aortic atherosclerosis. There are multiple foci of coronary artery calcification. Pericardium is not appreciably thickened. There is a degree of left ventricular hypertrophy. Pacemaker leads are attached to the right atrium and right ventricle. Mediastinum/Nodes: Thyroid appears unremarkable. There is no appreciable thoracic adenopathy. There is no appreciable esophageal lesion. Lungs/Pleura: There is stable scarring in each lung apex. There is a nodular opacity in the posterior segment of the right upper lobe best seen on axial slice 40 series 6 measuring 6 x 6 mm. There is mild peripheral mucous plugging in the right upper lobe posterior segment. There is chronic consolidation with  volume loss and bronchiectasis in the medial segment of the right middle lobe as well as in the inferior lingula. There is a nodular opacity abutting the pleura in posterior segment of the left lower lobe measuring 6 x 6 mm. There is no appreciable pleural effusion or pleural thickening. Upper Abdomen: In the visualized upper abdomen, there is atherosclerotic calcification in the aorta. Gallbladder is absent. There are parapelvic cysts on the left, stable. There is soft tissue thickening in the lateral posterior left upper abdominal wall, incompletely visualized. Musculoskeletal: There are no blastic or lytic bone lesions. Review of the MIP images confirms the above findings. IMPRESSION: 1.  No demonstrable pulmonary embolus. 2. No thoracic aortic aneurysm or dissection. There are foci of aortic atherosclerosis as well as foci of coronary artery calcification. 3. Pacemaker leads attached right atrium right ventricle. Left ventricular hypertrophy present. No appreciable pericardial effusion. 4. There are new nodular opacities in the right upper lobe and left lower lobe, each measuring 6 mm. Non-contrast chest CT at 3-6 months is recommended. If the nodules are stable at time of repeat CT, then future CT at 18-24 months (from today's scan) is considered optional for low-risk patients, but is recommended for high-risk patients. This recommendation follows the consensus statement: Guidelines for Management of Incidental Pulmonary Nodules Detected on CT Images: From the Fleischner Society 2017; Radiology 2017; 284:228-243. 5. Chronic volume loss/consolidation in the medial segment right middle lobe and inferior lingula with localized bronchiectatic change in these areas. 6. Slight posterior segment right upper lobe peripheral mucous plugging. 7.  No appreciable adenopathy. 8. Incomplete visualization of soft tissue thickening in the posterolateral left mid abdominal wall. Question traumatic lesion with developing  hematoma. 9.  Gallbladder absent. Aortic Atherosclerosis (ICD10-I70.0). Electronically Signed   By: Lowella Grip III M.D.   On: 06/27/2017 15:51    Procedures Procedures (including critical care time)  Medications Ordered in ED Medications  iopamidol (ISOVUE-370) 76 % injection 80 mL (80 mLs Intravenous Contrast Given 06/27/17 1522)     Initial Impression / Assessment and Plan / ED Course  I have reviewed the triage vital signs and the nursing notes.  Pertinent labs & imaging results that were available during my care of the patient were reviewed by me and considered in my medical decision making (see chart for details).  Patient presents to the ED with complaints of generalized weakness over the past week. She reports some chest pain and shortness of breath that is intermittent. Last episode was several days ago. Patient states that she feels "exhausted". She denies any infectious symptoms.   Patient does have chronic pain in her left leg from prior CVA and some chronic swelling. Patient walks with a cane at baseline and is not bedridden.   Vital signs are reassuring. No hypoxia or tachypnea noted. Patient is afebrile.   Exam is reassuring. No focal neuro deficits. No abdominal tenderness to palpation. No obvious lower extremity edema noted. Chest pain is not pleuritic. Patient has no ecchymosis noted to her body.   Lab work is overall with reassuring. Patient without any leukocytosis. Creatinine is at baseline. Normal hemoglobin. UA shows no signs of infection. Negative delta troponin. Patient does have a history of DVT is not currently anticoagulated. We will send a d-dimer. Chest x-ray is unremarkable. Patient has a pacemaker and I did talk to [employer] who Interrogated the pacemaker set there is no abnormalities.   D-dimer returned elevated. CTA of chest was ordered. No signs of pulmonary embolism seen. Patient does have lung nodule seen that need close follow up PCP and  patient performed. Patient does have a soft tissue swelling that is noted to left upper abdomen on CT scan and possible developing hematoma. No ecchymosis or tenderness noted to palpation on exam.   Unsure patient's etiologies of weakness.  Clinical presentation is not concerning for ACS, CVA.Workup today in the ED has been reassuring. Patient needs close follow up PCP. Patient was seen and evaluated by attending Dr. Jeneen Rinks was agreeable to above plan.  Pt is hemodynamically stable, in NAD, & able to ambulate in the ED. Evaluation does not show pathology that would require ongoing emergent intervention or inpatient treatment. I explained the diagnosis to the patient. Pain has been managed & has no complaints prior to dc. Pt is comfortable with above plan and is stable for discharge at this time. All questions were answered prior to disposition. Strict return precautions for f/u to the ED were discussed. Encouraged follow up with PCP.   Final Clinical Impressions(s) / ED Diagnoses   Final diagnoses:  Weakness    New Prescriptions New Prescriptions   No medications on file     Aaron Edelman 06/28/17 1525    Tanna Furry, MD 07/09/17 [phone]

## 2017-06-27 NOTE — ED Triage Notes (Signed)
Pt is here due to increasing generalized weakness 9she thought it was related to her age) this weakness became much more yesterday and she states that she felt some heaviness on her chest which she still does, some sob.  She also reports that she has been getting sweaty. Pt reports that she feels "exhausted" when she tries to get up and walk.  Pt denies cough, URI, no urinary symptoms.  No focal weakness, pt is alert and oriented.

## 2017-06-27 NOTE — Discharge Instructions (Signed)
Your imaging and blood work has been reassuring. Unknown cause of her weakness. Have discussed her CT findings. Please follow-up with primary care doctor. Return to the ED via any worsening symptoms.

## 2017-06-27 NOTE — ED Notes (Signed)
Patient returned from CT IV will not work for scan sluggish.  Patient was stuck 3 times for blood and 2 for IV.  Ordered IV team to obtain IV.

## 2017-06-27 NOTE — ED Notes (Signed)
Patient is alert and orientedx4.  Patient was explained discharge instructions and they understood them with no questions.  The patient's daughter is taking the  Patient home.

## 2017-06-27 NOTE — ED Notes (Signed)
Provider at bedside. Patient stated has pacemaker [employer]. Will have pacemaker interrogated.

## 2017-06-27 NOTE — ED Notes (Signed)
[employer] called will fax interrogation report. Pacemaker functioning properly.

## 2017-06-27 NOTE — ED Notes (Signed)
Patient returned from CT

## 2017-06-28 LAB — URINE CULTURE

## 2017-07-27 DIAGNOSIS — M5416 Radiculopathy, lumbar region: Secondary | ICD-10-CM | POA: Insufficient documentation

## 2017-07-27 HISTORY — DX: Radiculopathy, lumbar region: M54.16

## 2017-07-30 ENCOUNTER — Telehealth: Payer: Self-pay | Admitting: Internal Medicine

## 2017-07-30 NOTE — Telephone Encounter (Signed)
I spoke with the patient. She reports that she had a stroke in March and then another later in this year. However, she is having ongoing weakness and increased SOB. She is audibly SOB on the phone with some minimal exertion.  This not acute onset for her, but has been increasing over time. She request a follow up with Dr. [name]. I advised the patient I there is an opening on 08/04/17 at 8:20 am with his NP- Amber. I have offered her this time slot and she is agreeable.

## 2017-07-30 NOTE — Telephone Encounter (Signed)
New Message  Pt call requesting to speak with Rn. Pt states she wants to make an appt, but would like to speak with RN before she makes the appt. Please call back to discuss

## 2017-08-04 ENCOUNTER — Other Ambulatory Visit: Payer: Self-pay | Admitting: Physical Medicine and Rehabilitation

## 2017-08-04 ENCOUNTER — Encounter: Payer: Medicare HMO | Admitting: Nurse Practitioner

## 2017-08-04 DIAGNOSIS — M545 Low back pain, unspecified: Secondary | ICD-10-CM

## 2017-08-04 DIAGNOSIS — M5416 Radiculopathy, lumbar region: Secondary | ICD-10-CM

## 2017-08-09 ENCOUNTER — Ambulatory Visit
Admission: RE | Admit: 2017-08-09 | Discharge: 2017-08-09 | Disposition: A | Discharge status: Home / Self Care | Payer: Medicare HMO | Source: Ambulatory Visit | Attending: Physical Medicine and Rehabilitation | Admitting: Physical Medicine and Rehabilitation

## 2017-08-09 DIAGNOSIS — M5416 Radiculopathy, lumbar region: Secondary | ICD-10-CM

## 2017-08-09 DIAGNOSIS — M545 Low back pain, unspecified: Secondary | ICD-10-CM

## 2017-08-09 NOTE — Progress Notes (Signed)
Cardiology Office Note Date:  08/10/2017  Patient ID:  [name], [name] 24-Nov-1923, MRN [id number] PCP:  Algis Greenhouse, MD  Cardiologist:  Dr. Caryl Comes    Chief Complaint:  Fatigue, SOB  History of Present Illness: [name] [name] is a 83 y.o. female with history of CHB w/PP, hx of palpitations associated with non-sustained atrial arrhythmias and NSVT, bronchiectasis, COPD, HTN, HLD, CVA 2013 hemorrhagic another CVA in march 2017 started on Eliquis though intolerant w/nausea, when last seen by Dr. Caryl Comes in Dec 2017 started on Xarelto.  He mentions "reviewed all of the electrograms from her device. There was atrial fibrillation of about 1 minute's duration in the spring. She is also on multiple episodes of atrial tachycardia. Given her very high CHADS-VASc score, I am inclined again towards anticoagulation"  She is accompanied by a friend today.  She states that ever since her stroke last year she has had steady decline in her ability to get around.  She feels like despite having had PT and all she has not regained any real functional capacity back and in-fact feels like there has been steady decline,.  Generally feels weak all of the time, get fatigue even by the simplest tasks like making her bed.  She mentions a sharp pain to her chest that dates back well before the stroke, not associated with activity, perhaps a palpitations, she has trouble describing it, no near syncope or syncope.  She has no rest SOB but gets winded fairly easily especially in the last few months it seems.  She has not had any pulmonary follow in perhaps a year or so to her recollection.  We discussed her BP high today though she says is not usually.  Explains that several months ago her BP was very high and her PMD stopped all ofher pills to "start over" and ever since then has been very good.  She recalls being on Eliquis that made her stomach upset and changing to another blood thinner but that her PMD instructed her to  stop it when stopping all of her other medicines.  She denies any bleeding or signs of bleeding. She has not had dizziness, no near syncope or syncope.  I do not elicit c/o CP   She had an ER vsit for weakness her labs were notable for Creat 1.2, and elevated Ddimer of 1.75, she had CT chest negative for PE though did note pulmonary findings with recommendations to have f/t scans , pacer check reported normal.   Device information: MDT dual chamber PPM, implanted 05/14/15, Dr. Therisa Doyne dependent today  Past Medical History:  Diagnosis Date  . Arthritis   . Bronchiectasis, non-tuberculous ([place])    a. Followed by Dr. Joya Gaskins.  . Colon polyps   . Complete heart block (HCC)    a. s/p MDT dual chamber PPM   . COPD (chronic obstructive pulmonary disease) ([place])    past hx with pneumonia  . Diverticulosis   . DVT (deep venous thrombosis) (HCC)    Resolved, no anticoagulation currently  . GERD (gastroesophageal reflux disease)   . Headache   . High cholesterol   . Hypertension   . IBS (irritable bowel syndrome)   . 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 MI (myocardial infarction) ?? probably not 1975   a. 1975 Reported MI;  b. 2011 Cath: nonobs dzs;  c. 2013 Nonischemic Myoview;  d. 12/2012 Echo: EF 60-65%, Gr1 DD.  . Nephrolithiasis   . Palpitation  a. 2010 Event Monitor: PACs and PVCs  . Pneumonia   . PONV (postoperative nausea and vomiting)   . Presence of permanent cardiac pacemaker   . Pulmonary fibrosis ([place])   . Stroke ([place])   . Wears dentures    full  . Wide-complex tachycardia ([place])    a. Followed by Dr. Caryl Comes, "I think this is VT, but i am not sure QRSd 135 1:1 AV."    Past Surgical History:  Procedure Laterality Date  . APPENDECTOMY    . bladder tack    . CARDIAC CATHETERIZATION Right 05/14/2015   Procedure: Temporary Pacemaker;  Surgeon: Thompson Grayer, MD;  Location: [facility];  Service: Cardiovascular;  Laterality: Right;  .  CHOLECYSTECTOMY    . COLON SURGERY     12inches colectomy s/p diverticulitis  . COLONOSCOPY W/ BIOPSIES AND POLYPECTOMY    . EP IMPLANTABLE DEVICE N/A 05/14/2015   Procedure: Pacemaker Implant;  Surgeon: Thompson Grayer, MD; [place] 1 (serial number [id number])     . KNEE ARTHROSCOPY Left 03/29/2017   Procedure: ARTHROSCOPY LEFT KNEE WITH PARTIAL MEDIAL AND PARTIAL LATERAL MENISCECTOMY;  Surgeon: Vickey Huger, MD;  Location: [facility];  Service: Orthopedics;  Laterality: Left;  Marland Kitchen MULTIPLE TOOTH EXTRACTIONS    . TONSILLECTOMY    . TOTAL ABDOMINAL HYSTERECTOMY    . TOTAL KNEE ARTHROPLASTY  12/12/2012   Procedure: TOTAL KNEE ARTHROPLASTY;  Surgeon: Alta Corning, MD;  Location: [facility];  Service: Orthopedics;  Laterality: Right;  right total knee arthroplasty    Current Outpatient Prescriptions  Medication Sig Dispense Refill  . aspirin EC 81 MG tablet Take 1 tablet (81 mg total) by mouth daily. 90 tablet 3  . Respiratory Therapy Supplies (FLUTTER) DEVI See admin instructions. Exercise lungs three to four (3-4) times a day as needed for flutter    . vitamin B-12 (CYANOCOBALAMIN) 1000 MCG tablet Take 1,000 mcg by mouth daily.     No current facility-administered medications for this visit.     Allergies:   Crestor [rosuvastatin]; Darvon; Meloxicam; Meperidine; Prednisone; Valsartan; Amiodarone; Aspirin-acetaminophen-caffeine; and Tramadol   Social History:  The patient  reports that she has never smoked. She has never used smokeless tobacco. She reports that she does not drink alcohol or use drugs.   Family History:  The patient's family history includes Heart attack in her mother; Heart disease in her father; Other in her son; Stomach cancer in her brother.  ROS:  Please see the history of present illness.  All other systems are reviewed and otherwise negative.   PHYSICAL EXAM:  VS:  BP (!) 164/82   Pulse 71   Ht 5' 6"  (1.676 m)   Wt 141 lb (64 kg)   BMI 22.76 kg/m   BMI: Body mass index is 22.76 kg/m. Well nourished, well developed, thin body habitus, in no acute distress  HEENT: normocephalic, atraumatic  Neck: no JVD, carotid bruits or masses Cardiac:  RRR; soft SM, no rubs, or gallops Lungs:  CTA b/l, no wheezing, rhonchi or rales  Abd: soft, nontender MS: no deformity, age appropriate atrophy Ext: no edema  Skin: warm and dry, no rash Neuro:  No gross deficits appreciated Psych: euthymic mood, full affect PPM site is stable, no tethering or discomfort   EKG:  06/27/17: AV paced, sinus V paced beat PPM interrogation done today and reviewed by myself: battery and lead measurements are good, 99.8% VP, V lead impedance has been declining, though  steady in the last several months and remains OK.  AMS episodes are brief, look like AT/PACSlongest duration 52mnutes   Catheterization 2011 without obstructive diseas  Echo (07/29/15): EF 55-60%, no RWMA, G1DD, no pericardial effusion. Notably also aortic root was normal. 11 Nuclear (06/11/15): Myocardial perfusion is normal. The study is normal. This is a low risk study. Overall left ventricular systolic function was normal. Nuclear stress EF: 60%. The left ventricular ejection fraction is normal (55-65%). There is no prior study for comparison. 7/17 Myoview [facility] normal 3/17 echocardiogram [facility] normal EF 2+ TR Intracranial imaging demonstrated old infarcts with encephalomalacia, possible old subdural hematoma  Recent Labs: 12/22/2016: ALT 14 06/27/2017: BUN 15; Creatinine, Ser 1.20; Hemoglobin 14.9; Platelets 177; Potassium 4.3; Sodium 139  No results found for requested labs within last 8760 hours.   CrCl cannot be calculated (Patient's most recent lab result is older than the maximum 21 days allowed.).   Wt Readings from Last 3 Encounters:  08/10/17 141 lb (64 kg)  03/29/17 143 lb (64.9 kg)  03/22/17 143 lb (64.9 kg)     Other studies reviewed: Additional studies/records  reviewed today include: summarized above  ASSESSMENT AND PLAN:  1. PPM     Intact function, no changes made  2. CVA hx, brief atrial arrhythmia, not clearly AF     Off her a/c ?   3. HTN     BP elevated here today, unusually so per the patient  Discussed a number of recommendations all of which the patient at least for now would like to defer to her PMD who she will see next week.  Re-evaluate anticoagulation given CVA last year and PAT's, atrial Fib historically has been very biref though CHADs is very high She had an echo in march of last year, with progressive functional decline would consider re-evaluation for possible pacer medicated CM Recommend re-establishing with pulmonary medicine BP was elevated today  Disposition: The patient wants to have her PMD manage all of her care, and will discuss these thoughts/recommendations with him (she is given her AVS with these written), she prefers to keep only her pacer management here.  Will continue q3 month remotes and 6 months in-clinic with Dr. KCaryl Comes sooner if needed.  Current medicines are reviewed at length with the patient today.  The patient did not have any concerns regarding medicines.  SVenetia Night PA-C 08/10/2017 3:46 PM     [place] [phone](office)  [phone](fax)

## 2017-08-10 ENCOUNTER — Telehealth: Payer: Self-pay | Admitting: Cardiology

## 2017-08-10 ENCOUNTER — Encounter: Payer: Medicare HMO | Admitting: *Deleted

## 2017-08-10 ENCOUNTER — Ambulatory Visit (INDEPENDENT_AMBULATORY_CARE_PROVIDER_SITE_OTHER): Payer: Medicare HMO | Admitting: Physician Assistant

## 2017-08-10 VITALS — BP 164/82 | HR 71 | Ht 66.0 in | Wt 141.0 lb

## 2017-08-10 DIAGNOSIS — I471 Supraventricular tachycardia: Secondary | ICD-10-CM

## 2017-08-10 DIAGNOSIS — I459 Conduction disorder, unspecified: Secondary | ICD-10-CM | POA: Diagnosis not present

## 2017-08-10 DIAGNOSIS — I10 Essential (primary) hypertension: Secondary | ICD-10-CM

## 2017-08-10 DIAGNOSIS — R531 Weakness: Secondary | ICD-10-CM | POA: Diagnosis not present

## 2017-08-10 DIAGNOSIS — Z950 Presence of cardiac pacemaker: Secondary | ICD-10-CM

## 2017-08-10 DIAGNOSIS — R5383 Other fatigue: Secondary | ICD-10-CM | POA: Diagnosis not present

## 2017-08-10 NOTE — Patient Instructions (Addendum)
Medication Instructions:    Your physician recommends that you continue on your current medications as directed. Please refer to the Current Medication list given to you today.   If you need a refill on your cardiac medications before your next appointment, please call your pharmacy.  Labwork: NONE ORDERED  TODAY    Testing/Procedures: NONE ORDERED  TODAY   Follow-Up:  Your physician wants you to follow-up in:  IN  6  MONTHS WITH DR Logan Bores will receive a reminder letter in the mail two months in advance. If you don't receive a letter, please call our office to schedule the follow-up appointment.  ' Remote monitoring is used to monitor your Pacemaker of ICD from home. This monitoring reduces the number of office visits required to check your device to one time per year. It allows [place] to keep an eye on the functioning of your device to ensure it is working properly. You are scheduled for a device check from home on .11-10-17 You may send your transmission at any time that day. If you have a wireless device, the transmission will be sent automatically. After your physician reviews your transmission, you will receive a postcard with your next transmission date.     Any Other Special Instructions Will Be Listed Below (If Applicable).   MAKE SURE YOU DISCUSS WITH YOUR PRIMARY CARE DOCTOR ABOUT  1.  RESUMING  XARELTO BLOOD THINNER   2.  BLOOD PRESSURE TODAY WAS 164/82 IN CLINIC MANUALLY TAKEN  3. IF SHOULD GET AN ECHOCARDIOGRAM.

## 2017-08-10 NOTE — Telephone Encounter (Signed)
Spoke with pt and reminded pt of remote transmission that is due today. Pt verbalized understanding.   

## 2017-08-11 ENCOUNTER — Encounter: Payer: Self-pay | Admitting: Cardiology

## 2017-09-01 ENCOUNTER — Inpatient Hospital Stay (HOSPITAL_COMMUNITY)
Admission: EM | Admit: 2017-09-01 | Discharge: 2017-09-07 | DRG: 103 | Disposition: A | Discharge status: Home Health | Payer: Medicare HMO | Attending: Student in an Organized Health Care Education/Training Program | Admitting: Student in an Organized Health Care Education/Training Program

## 2017-09-01 ENCOUNTER — Encounter (HOSPITAL_COMMUNITY): Payer: Self-pay | Admitting: Emergency Medicine

## 2017-09-01 ENCOUNTER — Emergency Department (HOSPITAL_COMMUNITY): Payer: Medicare HMO

## 2017-09-01 DIAGNOSIS — Z66 Do not resuscitate: Secondary | ICD-10-CM | POA: Diagnosis present

## 2017-09-01 DIAGNOSIS — G44201 Tension-type headache, unspecified, intractable: Secondary | ICD-10-CM

## 2017-09-01 DIAGNOSIS — R519 Headache, unspecified: Secondary | ICD-10-CM

## 2017-09-01 DIAGNOSIS — Z885 Allergy status to narcotic agent status: Secondary | ICD-10-CM

## 2017-09-01 DIAGNOSIS — I2510 Atherosclerotic heart disease of native coronary artery without angina pectoris: Secondary | ICD-10-CM | POA: Diagnosis present

## 2017-09-01 DIAGNOSIS — R402362 Coma scale, best motor response, obeys commands, at arrival to emergency department: Secondary | ICD-10-CM | POA: Diagnosis present

## 2017-09-01 DIAGNOSIS — M5416 Radiculopathy, lumbar region: Secondary | ICD-10-CM

## 2017-09-01 DIAGNOSIS — Y844 Aspiration of fluid as the cause of abnormal reaction of the patient, or of later complication, without mention of misadventure at the time of the procedure: Secondary | ICD-10-CM | POA: Diagnosis present

## 2017-09-01 DIAGNOSIS — R51 Headache: Secondary | ICD-10-CM

## 2017-09-01 DIAGNOSIS — R402252 Coma scale, best verbal response, oriented, at arrival to emergency department: Secondary | ICD-10-CM | POA: Diagnosis present

## 2017-09-01 DIAGNOSIS — Z79899 Other long term (current) drug therapy: Secondary | ICD-10-CM

## 2017-09-01 DIAGNOSIS — I428 Other cardiomyopathies: Secondary | ICD-10-CM | POA: Diagnosis present

## 2017-09-01 DIAGNOSIS — I252 Old myocardial infarction: Secondary | ICD-10-CM

## 2017-09-01 DIAGNOSIS — J449 Chronic obstructive pulmonary disease, unspecified: Secondary | ICD-10-CM | POA: Diagnosis present

## 2017-09-01 DIAGNOSIS — Z96653 Presence of artificial knee joint, bilateral: Secondary | ICD-10-CM | POA: Diagnosis present

## 2017-09-01 DIAGNOSIS — I10 Essential (primary) hypertension: Secondary | ICD-10-CM | POA: Diagnosis present

## 2017-09-01 DIAGNOSIS — Z90710 Acquired absence of both cervix and uterus: Secondary | ICD-10-CM

## 2017-09-01 DIAGNOSIS — Z7982 Long term (current) use of aspirin: Secondary | ICD-10-CM

## 2017-09-01 DIAGNOSIS — F329 Major depressive disorder, single episode, unspecified: Secondary | ICD-10-CM | POA: Diagnosis present

## 2017-09-01 DIAGNOSIS — R402142 Coma scale, eyes open, spontaneous, at arrival to emergency department: Secondary | ICD-10-CM | POA: Diagnosis present

## 2017-09-01 DIAGNOSIS — Z8249 Family history of ischemic heart disease and other diseases of the circulatory system: Secondary | ICD-10-CM

## 2017-09-01 DIAGNOSIS — Z86718 Personal history of other venous thrombosis and embolism: Secondary | ICD-10-CM

## 2017-09-01 DIAGNOSIS — Z886 Allergy status to analgesic agent status: Secondary | ICD-10-CM

## 2017-09-01 DIAGNOSIS — G971 Other reaction to spinal and lumbar puncture: Principal | ICD-10-CM

## 2017-09-01 DIAGNOSIS — Z950 Presence of cardiac pacemaker: Secondary | ICD-10-CM

## 2017-09-01 HISTORY — DX: Other reaction to spinal and lumbar puncture: G97.1

## 2017-09-01 LAB — COMPREHENSIVE METABOLIC PANEL
ALT: 16 U/L (ref 14–54)
AST: 22 U/L (ref 15–41)
Albumin: 3.8 g/dL (ref 3.5–5.0)
Alkaline Phosphatase: 51 U/L (ref 38–126)
Anion gap: 6 (ref 5–15)
BILIRUBIN TOTAL: 0.9 mg/dL (ref 0.3–1.2)
BUN: 19 mg/dL (ref 6–20)
CALCIUM: 9.2 mg/dL (ref 8.9–10.3)
CHLORIDE: 105 mmol/L (ref 101–111)
CO2: 26 mmol/L (ref 22–32)
CREATININE: 1.02 mg/dL — AB (ref 0.44–1.00)
GFR, EST AFRICAN AMERICAN: 54 mL/min — AB (ref >60)
GFR, EST NON AFRICAN AMERICAN: 47 mL/min — AB (ref >60)
Glucose, Bld: 99 mg/dL (ref 65–99)
Potassium: 3.8 mmol/L (ref 3.5–5.1)
Sodium: 137 mmol/L (ref 135–145)
TOTAL PROTEIN: 6.2 g/dL — AB (ref 6.5–8.1)

## 2017-09-01 LAB — CBC WITH DIFFERENTIAL/PLATELET
BASOS ABS: 0 10*3/uL (ref 0.0–0.1)
Basophils Relative: 1 %
EOS PCT: 2 %
Eosinophils Absolute: 0.1 10*3/uL (ref 0.0–0.7)
HEMATOCRIT: 42.9 % (ref 36.0–46.0)
Hemoglobin: 14.1 g/dL (ref 12.0–15.0)
LYMPHS ABS: 1.2 10*3/uL (ref 0.7–4.0)
LYMPHS PCT: 21 %
MCH: 30.6 pg (ref 26.0–34.0)
MCHC: 32.9 g/dL (ref 30.0–36.0)
MCV: 93.1 fL (ref 78.0–100.0)
Monocytes Absolute: 0.5 10*3/uL (ref 0.1–1.0)
Monocytes Relative: 9 %
NEUTROS ABS: 3.8 10*3/uL (ref 1.7–7.7)
NEUTROS PCT: 67 %
PLATELETS: 145 10*3/uL — AB (ref 150–400)
RBC: 4.61 MIL/uL (ref 3.87–5.11)
RDW: 14.1 % (ref 11.5–15.5)
WBC: 5.6 10*3/uL (ref 4.0–10.5)

## 2017-09-01 LAB — URINALYSIS, ROUTINE W REFLEX MICROSCOPIC
Bilirubin Urine: NEGATIVE
Glucose, UA: NEGATIVE mg/dL
HGB URINE DIPSTICK: NEGATIVE
KETONES UR: NEGATIVE mg/dL
Leukocytes, UA: NEGATIVE
NITRITE: NEGATIVE
PROTEIN: NEGATIVE mg/dL
SPECIFIC GRAVITY, URINE: 1.011 (ref 1.005–1.030)
pH: 6 (ref 5.0–8.0)

## 2017-09-01 LAB — SEDIMENTATION RATE: SED RATE: 11 mm/h (ref 0–22)

## 2017-09-01 LAB — C-REACTIVE PROTEIN

## 2017-09-01 LAB — I-STAT CG4 LACTIC ACID, ED: LACTIC ACID, VENOUS: 0.92 mmol/L (ref 0.5–1.9)

## 2017-09-01 MED ORDER — VITAMIN B-12 1000 MCG PO TABS
1000.0000 ug | ORAL_TABLET | Freq: Every day | ORAL | Status: DC
  Administered 2017-09-01 – 2017-09-07 (×7): 1000 ug via ORAL
  Filled 2017-09-01 (×7): qty 1

## 2017-09-01 MED ORDER — SODIUM CHLORIDE 0.9 % IV BOLUS (SEPSIS)
500.0000 mL | Freq: Once | INTRAVENOUS | Status: AC
  Administered 2017-09-01: 500 mL via INTRAVENOUS

## 2017-09-01 MED ORDER — ASPIRIN EC 81 MG PO TBEC
81.0000 mg | DELAYED_RELEASE_TABLET | Freq: Every day | ORAL | Status: DC
  Administered 2017-09-01 – 2017-09-07 (×7): 81 mg via ORAL
  Filled 2017-09-01 (×7): qty 1

## 2017-09-01 MED ORDER — FENTANYL CITRATE (PF) 100 MCG/2ML IJ SOLN
50.0000 ug | Freq: Once | INTRAMUSCULAR | Status: AC
  Administered 2017-09-01: 50 ug via INTRAVENOUS
  Filled 2017-09-01: qty 2

## 2017-09-01 MED ORDER — KETOROLAC TROMETHAMINE 15 MG/ML IJ SOLN
15.0000 mg | Freq: Four times a day (QID) | INTRAMUSCULAR | Status: AC | PRN
  Administered 2017-09-01 – 2017-09-05 (×4): 15 mg via INTRAVENOUS
  Filled 2017-09-01 (×4): qty 1

## 2017-09-01 MED ORDER — AMLODIPINE BESYLATE 2.5 MG PO TABS
2.5000 mg | ORAL_TABLET | Freq: Every day | ORAL | Status: DC
  Administered 2017-09-01 – 2017-09-02 (×2): 2.5 mg via ORAL
  Filled 2017-09-01 (×2): qty 1

## 2017-09-01 MED ORDER — METOCLOPRAMIDE HCL 5 MG/ML IJ SOLN
5.0000 mg | Freq: Once | INTRAMUSCULAR | Status: AC
  Administered 2017-09-01: 5 mg via INTRAVENOUS
  Filled 2017-09-01: qty 2

## 2017-09-01 MED ORDER — ENOXAPARIN SODIUM 40 MG/0.4ML ~~LOC~~ SOLN
40.0000 mg | SUBCUTANEOUS | Status: DC
  Administered 2017-09-01: 40 mg via SUBCUTANEOUS
  Filled 2017-09-01: qty 0.4

## 2017-09-01 MED ORDER — DULOXETINE HCL 30 MG PO CPEP
30.0000 mg | ORAL_CAPSULE | Freq: Every day | ORAL | Status: DC
  Administered 2017-09-01 – 2017-09-07 (×7): 30 mg via ORAL
  Filled 2017-09-01 (×7): qty 1

## 2017-09-01 NOTE — ED Provider Notes (Addendum)
[facility] EMERGENCY DEPARTMENT Provider Note   CSN: [id number] Arrival date & time: 09/01/17  6389     History   Chief Complaint Chief Complaint  Patient presents with  . Back Pain  . Headache    HPI [name] [name] is a 83 y.o. female.  Presents to the emergency department for evaluation of headache, back pain, generalized weakness.  Patient has a history of chronic low back pain, sees a back specialist and therapist in [place].  She has had multiple recent visits for this pain.  Patient underwent epidural steroid injection recently, unknown when.  Patient reports that since that time she has developed a headache and her back pain has worsened.  She is experiencing numbness in her left leg.  Pain in lower back radiates down the entire length of the leg.  She has not had any change in her slight urinary incontinence, no urinary retention.      Past Medical History:  Diagnosis Date  . Arthritis   . Bronchiectasis, non-tuberculous ([place])    a. Followed by Dr. Joya Gaskins.  . Colon polyps   . Complete heart block (HCC)    a. s/p MDT dual chamber PPM   . COPD (chronic obstructive pulmonary disease) ([place])    past hx with pneumonia  . Diverticulosis   . DVT (deep venous thrombosis) (HCC)    Resolved, no anticoagulation currently  . GERD (gastroesophageal reflux disease)   . Headache   . High cholesterol   . Hypertension   . IBS (irritable bowel syndrome)   . 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 MI (myocardial infarction) ?? probably not 1975   a. 1975 Reported MI;  b. 2011 Cath: nonobs dzs;  c. 2013 Nonischemic Myoview;  d. 12/2012 Echo: EF 60-65%, Gr1 DD.  . Nephrolithiasis   . Palpitation    a. 2010 Event Monitor: PACs and PVCs  . Pneumonia   . PONV (postoperative nausea and vomiting)   . Presence of permanent cardiac pacemaker   . Pulmonary fibrosis ([place])   . Stroke ([place])   . Wears dentures    full  . Wide-complex tachycardia  ([place])    a. Followed by Dr. Caryl Comes, "I think this is VT, but i am not sure QRSd 135 1:1 AV."    Patient Active Problem List   Diagnosis Date Noted  . Chest pain with low risk for cardiac etiology 11/18/2015  . Pacemaker   . Bradycardia with 31 - 40 beats per minute 05/14/2015  . Heart block   . Pain in limb 08/16/2013  . Varicose veins of lower extremities with other complications 37/34/2876  . Edema -lower extremity 06/22/2013  . Syncope 12/15/2012  . Osteoarthritis of right knee 12/12/2012  . History of pulmonary embolism 08/30/2012  . Fatigue 08/19/2012  . Wide-complex tachycardia ([place]) 05/30/2012  . Bronchiectasis, non-tuberculous ([place]) 01/06/2012  . 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 Diverticulosis   . High cholesterol   . Coronary artery disease prior MI   . Hypertension     Past Surgical History:  Procedure Laterality Date  . APPENDECTOMY    . bladder tack    . CARDIAC CATHETERIZATION Right 05/14/2015   Procedure: Temporary Pacemaker;  Surgeon: Thompson Grayer, MD;  Location: [facility];  Service: Cardiovascular;  Laterality: Right;  . CHOLECYSTECTOMY    . COLON SURGERY     12inches colectomy s/p diverticulitis  . COLONOSCOPY W/ BIOPSIES AND POLYPECTOMY    . EP IMPLANTABLE  DEVICE N/A 05/14/2015   Procedure: Pacemaker Implant;  Surgeon: Thompson Grayer, MD; [place] 1 (serial number [id number])     . KNEE ARTHROSCOPY Left 03/29/2017   Procedure: ARTHROSCOPY LEFT KNEE WITH PARTIAL MEDIAL AND PARTIAL LATERAL MENISCECTOMY;  Surgeon: Vickey Huger, MD;  Location: [facility];  Service: Orthopedics;  Laterality: Left;  Marland Kitchen MULTIPLE TOOTH EXTRACTIONS    . TONSILLECTOMY    . TOTAL ABDOMINAL HYSTERECTOMY    . TOTAL KNEE ARTHROPLASTY  12/12/2012   Procedure: TOTAL KNEE ARTHROPLASTY;  Surgeon: Alta Corning, MD;  Location: [facility];  Service: Orthopedics;  Laterality: Right;  right total knee arthroplasty    OB History    No data available        Home Medications    Prior to Admission medications   Medication Sig Start Date End Date Taking? Authorizing Provider  aspirin EC 81 MG tablet Take 1 tablet (81 mg total) by mouth daily. 05/14/17  Yes Deboraha Sprang, MD  DULoxetine (CYMBALTA) 30 MG capsule Take 30 mg by mouth daily.   Yes [provider]  traMADol (ULTRAM) 50 MG tablet Take 50 mg by mouth every 6 (six) hours as needed for moderate pain.  08/30/17  Yes [provider]  vitamin B-12 (CYANOCOBALAMIN) 1000 MCG tablet Take 1,000 mcg by mouth daily.   Yes [provider]  Respiratory Therapy Supplies (FLUTTER) DEVI See admin instructions. Exercise lungs three to four (3-4) times a day as needed for flutter    [provider]    Family History Family History  Problem Relation Age of Onset  . Heart disease Father   . Heart attack Mother   . Stomach cancer Brother   . Other Son        Triple Bypass    Social History Social History  Substance Use Topics  . Smoking status: Never Smoker  . Smokeless tobacco: Never Used  . Alcohol use No     Allergies   Crestor [rosuvastatin]; Darvon; Meloxicam; Meperidine; Prednisone; Valsartan; Amiodarone; Aspirin-acetaminophen-caffeine; and Tramadol   Review of Systems Review of Systems  Constitutional: Positive for fatigue.  Musculoskeletal: Positive for back pain.  Neurological: Positive for headaches.  All other systems reviewed and are negative.    Physical Exam Updated Vital Signs BP (!) 198/93   Pulse 81   Temp 98.3 F (36.8 C) (Oral)   Resp (!) 22   Ht _0  (1.676 m)   Wt 62.6 kg (138 lb)   SpO2 100%   BMI 22.27 kg/m   Physical Exam  Constitutional: She is oriented to person, place, and time. She appears well-developed and well-nourished. No distress.  HENT:  Head: Normocephalic and atraumatic.  Right Ear: Hearing normal.  Left Ear: Hearing normal.  Nose: Nose normal.  Mouth/Throat: Oropharynx is clear and moist  and mucous membranes are normal.  Eyes: Pupils are equal, round, and reactive to light. Conjunctivae and EOM are normal.  Neck: Normal range of motion. Neck supple.  Cardiovascular: Regular rhythm, S1 normal and S2 normal.  Exam reveals no gallop and no friction rub.   No murmur heard. Pulmonary/Chest: Effort normal and breath sounds normal. No respiratory distress. She exhibits no tenderness.  Abdominal: Soft. Normal appearance and bowel sounds are normal. There is no hepatosplenomegaly. There is no tenderness. There is no rebound, no guarding, no tenderness at McBurney's point and negative Murphy's sign. No hernia.  Musculoskeletal: Normal range of motion.  Lumbar back: She exhibits tenderness.       Back:  Neurological: She is alert and oriented to person, place, and time. She has normal strength. No cranial nerve deficit or sensory deficit. Coordination normal. GCS eye subscore is 4. GCS verbal subscore is 5. GCS motor subscore is 6.  Can only lift left leg to ~20 deg before pain prohibits further movement  Skin: Skin is warm, dry and intact. No rash noted. No cyanosis.  Psychiatric: She has a normal mood and affect. Her speech is normal and behavior is normal. Thought content normal.  Nursing note and vitals reviewed.    ED Treatments / Results  Labs (all labs ordered are listed, but only abnormal results are displayed) Labs Reviewed  CBC WITH DIFFERENTIAL/PLATELET - Abnormal; Notable for the following:       Result Value   Platelets 145 (*)    All other components within normal limits  COMPREHENSIVE METABOLIC PANEL - Abnormal; Notable for the following:    Creatinine, Ser 1.02 (*)    Total Protein 6.2 (*)    GFR calc non Af Amer 47 (*)    GFR calc Af Amer 54 (*)    All other components within normal limits  URINALYSIS, ROUTINE W REFLEX MICROSCOPIC  SEDIMENTATION RATE  C-REACTIVE PROTEIN  I-STAT CG4 LACTIC ACID, ED    EKG  EKG Interpretation None        Radiology Ct Head Wo Contrast  Result Date: 09/01/2017 CLINICAL DATA:  Vertex headache with weakness, dizziness and vomiting since spinal injection 1 week ago. EXAM: CT HEAD WITHOUT CONTRAST TECHNIQUE: Contiguous axial images were obtained from the base of the skull through the vertex without intravenous contrast. COMPARISON:  07/09/2017 FINDINGS: Brain: Generalized brain atrophy. Chronic small-vessel ischemic changes of the pons. Few old small vessel cerebellar infarctions. Old infarction in the left occipital lobe. Chronic small-vessel ischemic changes of the hemispheric white matter. Bilateral low-density hygromas. No sign of acute subdural process. No hydrocephalus. No hemorrhage or mass lesion. Vascular: There is atherosclerotic calcification of the major vessels at the base of the brain. Skull: Negative Sinuses/Orbits: Clear/normal Other: None IMPRESSION: No change. Chronic small-vessel ischemic changes. Old left occipital infarction. Generalized atrophy. Bilateral subdural hygromas. No finding seen to explain the acute headache or as a complication of recent spinal injection. Electronically Signed   By: Nelson Chimes M.D.   On: 09/01/2017 07:34    Procedures Procedures (including critical care time)  Medications Ordered in ED Medications - No data to display   Initial Impression / Assessment and Plan / ED Course  I have reviewed the triage vital signs and the nursing notes.  Pertinent labs & imaging results that were available during my care of the patient were reviewed by me and considered in my medical decision making (see chart for details).     Presents to the emergency department for evaluation of headache and back pain.  Patient cannot provide much history.  Records obtained through care everywhere.  Patient has had ongoing problems with back pain, has been seeing her orthopedic surgeon and spine surgeon recently.  She had an epidural corticosteroid injection several weeks ago.   She reports worsening symptoms since then.  I do not appreciate any significant neurologic dysfunction, however she does have a positive left leg straight raise.  It is unclear if this is new or not, but she reports this is worsening.  Patient did recently have epidural injection, cannot rule out post procedure infection, although felt  to be less likely.  Unfortunately, however, patient has a pacemaker, cannot get MRI.  Will order sed rate and C-reactive protein, white blood cell count.  Did briefly discuss this with Dr. Sherwood Gambler, on-call for neurosurgery.  I did not ask for a formal consult at this time.  He did inform me that he felt that post epidural injection infections are exceedingly rare and that it was more likely that the patient simply is having worsening symptoms from her chronic pathology.  This does fit the picture better, patient is not febrile, does not have an elevated lactic acid, does not have an elevated white blood cell count.  She does not have any focal findings other than the findings of radiculopathy which have been present for quite some time and her.  Dr. Sherwood Gambler recommends simply treating her symptoms and ultimately getting her back to her back doctor that knows her well.  Patient experiencing headache.  I did sit her up in the bed and she reported that her headache was worsened when she sits up, improves with lying down.  This potentially is consistent with post dural puncture headache.  Will order CT to rule out other pathology.  If this does not respond to simple analgesia, might require blood patch.  Final Clinical Impressions(s) / ED Diagnoses   Final diagnoses:  Lumbar radiculopathy  Bad headache    New Prescriptions New Prescriptions   No medications on file     Orpah Greek, MD 09/01/17 [id number]    Orpah Greek, MD 09/01/17 [phone]

## 2017-09-01 NOTE — ED Provider Notes (Signed)
Patient visit shared.  She has a history of chronic back pain and had an epidural injection a week ago Monday.  She was doing well after the injection but then developed worsening of her back pain as well as a gradually worsening headache.  Headache is 7-8 out of 10 in nature.  Yesterday she was prescribed tramadol for the headache and back pain and shortly after taking it she developed nausea and vomiting.  She reports persistent headache, back pain and nausea at this time.    On repeat assessment patient had worsening pain, she was treated with fentanyl with only modest improvement in her symptoms.  Discussed with interventional radiology options for blood patch - recommend 24 hours of supine bedrest with reassessment at that time.  Medicine consulted for observation patient for symptom mgmt.     Tilden Fossa, MD 09/01/17 [phone]

## 2017-09-01 NOTE — ED Triage Notes (Signed)
Pt BIB EMS from home. Pt c/o back pain, h/a, n/v since 1600 yesterday. Denies fall or other trauma. Per EMS, pt had some type of "back injection" last Monday. Some existing L weakness from prior CVA.

## 2017-09-01 NOTE — H&P (Signed)
Date: 09/01/2017               Patient Name:  [name] [name] MRN: [id number]  DOB: 1924/01/08 Age / Sex: 83 y.o., female   PCP: Algis Greenhouse, MD         Medical Service: Internal Medicine Teaching Service         Attending Physician: Dr. Evette Doffing, Mallie Mussel, *    First Contact: Dr. Berneice Gandy Pager: [phone]  Second Contact: Dr. Heber [place] Pager: [phone]       After Hours (After 5p/  First Contact Pager: [phone]  weekends / holidays): Second Contact Pager: [phone]   Chief Complaint: headache and back pain  History of Present Illness: Patient is a 83 year old female with a past medical history of NICM, CHB s/p PPM (05/2015), HTN, HLD, COPD, CVA, prior DVT presenting to the hospital with a chief complaint of headaches and back pain. History was obtained from the patient and chart review. She has a history of lumbar radiculopathy and received a spinal epidural injection about 9 days ago. States she started experiencing back pain again about 24 hours after the injection. She has chronic lower back pain that radiates to her left lower extremity and is associated with numbness and tingling in that leg. States about 3-4 days after the epidural injection, she started experiencing a constant, 5-6/ 10 intensity occipital headache which was worse at times. Patient does not believe that the headache is relieved or exacerbated by any positional changes. Denies having any fevers, chills, or neck stiffness. Denies having any photophobia, phonophobia, or vision changes. She has chronic left upper and lower extremity weakness as a residual deficit from her prior CVA; denies having any new weakness. States she has tried Tylenol but it did not help with the headache. She then tried taking a tablet of tramadol yesterday which made her nauseous; no episodes of vomiting. She has been feeling weak lately. Per son at bedside, patient is very sensitive to/ at risk for delerium with opioid pain medications.   Meds:   Current Meds  Medication Sig  . aspirin EC 81 MG tablet Take 1 tablet (81 mg total) by mouth daily.  . DULoxetine (CYMBALTA) 30 MG capsule Take 30 mg by mouth daily.  . traMADol (ULTRAM) 50 MG tablet Take 50 mg by mouth every 6 (six) hours as needed for moderate pain.   . vitamin B-12 (CYANOCOBALAMIN) 1000 MCG tablet Take 1,000 mcg by mouth daily.   Allergies: Allergies as of 09/01/2017 - Review Complete 09/01/2017  Allergen Reaction Noted  . Crestor [rosuvastatin] Other (See Comments) 05/05/2012  . Darvon Other (See Comments)   . Meloxicam Other (See Comments) 04/16/2016  . Meperidine Other (See Comments) 04/16/2016  . Prednisone Diarrhea 01/16/2014  . Valsartan Other (See Comments) 04/16/2016  . Amiodarone Other (See Comments) 11/27/2013  . Aspirin-acetaminophen-caffeine Other (See Comments) 04/16/2016  . Tramadol Other (See Comments) 04/16/2016   Past Medical History:  Diagnosis Date  . Arthritis   . Bronchiectasis, non-tuberculous ([place])    a. Followed by Dr. Joya Gaskins.  . Colon polyps   . Complete heart block (HCC)    a. s/p MDT dual chamber PPM   . COPD (chronic obstructive pulmonary disease) ([place])    past hx with pneumonia  . Diverticulosis   . DVT (deep venous thrombosis) (HCC)    Resolved, no anticoagulation currently  . GERD (gastroesophageal reflux disease)   . Headache   . High cholesterol   . Hypertension   .  IBS (irritable bowel syndrome)   . 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 MI (myocardial infarction) ?? probably not 1975   a. 1975 Reported MI;  b. 2011 Cath: nonobs dzs;  c. 2013 Nonischemic Myoview;  d. 12/2012 Echo: EF 60-65%, Gr1 DD.  . Nephrolithiasis   . Palpitation    a. 2010 Event Monitor: PACs and PVCs  . Pneumonia   . PONV (postoperative nausea and vomiting)   . Post-dural puncture headache 09/01/2017  . Presence of permanent cardiac pacemaker   . Pulmonary fibrosis ([place])   . Stroke ([place])   . Wears dentures    full  .  Wide-complex tachycardia ([place])    a. Followed by Dr. Caryl Comes, "I think this is VT, but i am not sure QRSd 135 1:1 AV."    Family History: Father had MI and died at age 41. Mother had MI and died at age 80. She has 13 siblings and several of them have cancer and MIs.   Social History: Denies any tobacco, ethanol, or illicit drug use.  Review of Systems: A complete ROS was negative except as per HPI.  Physical Exam: Blood pressure (!) 167/69, pulse 62, temperature 98.3 F (36.8 C), temperature source Oral, resp. rate 14, height _0  (1.676 m), weight 138 lb (62.6 kg), SpO2 98 %. Physical Exam  Constitutional: She is oriented to person, place, and time. She appears well-developed and well-nourished. No distress.  Elderly female resting comfortably in hospital stretcher.   HENT:  Head: Normocephalic and atraumatic.  Mouth/Throat: Oropharynx is clear and moist.  Eyes: Pupils are equal, round, and reactive to light. EOM are normal.  Neck: Neck supple. No tracheal deviation present.  Cardiovascular: Normal rate, regular rhythm and intact distal pulses.  Exam reveals no gallop and no friction rub.   No murmur heard. Pulmonary/Chest: Effort normal and breath sounds normal. No respiratory distress. She has no wheezes. She has no rales.  Abdominal: Soft. Bowel sounds are normal. She exhibits no distension. There is no tenderness.  Musculoskeletal: She exhibits no edema.  Neurological: She is alert and oriented to person, place, and time. No cranial nerve deficit.  Strength 4 out of 5 in the left upper and lower extremities. Strength 5 out of 5 in the right upper and lower extremities. Sensation to light touch intact throughout. Straight leg raise test positive at 30 on the left.  No nuchal rigidity  Skin: Skin is warm and dry.  Psychiatric: She has a normal mood and affect. Her behavior is normal.   Assessment & Plan by Problem: Active Problems:   Post-dural puncture headache  83 year old  female with a past medical history of NICM, CHB s/p PPM (05/2015), HTN, HLD, COPD, CVA, prior DVT presenting to the hospital with a chief complaint of headaches and back pain.  Post dural puncture headache: Although the atient does not endorse any alleviation/exacerbation of her headache with positional changes, she is likely suffering from post dural puncture headache as it started a few days after her epidural injection. CT of head done in the ED negative for any acute changes. She has residual left-sided weakness from her prior stroke but no new neurological deficits on exam. No nuchal rigidity or signs of meningeal irritation. Her nausea was related to taking tramadol yesterday and has now resolved. No signs of infection as she is afebrile and does not have leukocytosis. ESR within normal limits. Lactic acid normal. Per ED physician note, he spoke to the on-call neurosurgeon and he  felt that post epidural injection infections are exceedingly rare and that it is more likely that the patient was having worsening symptoms from her chronic pathology. The ED physician I spoke to over the phone informed me that she spoke to interventional radiology and they thought the patient's presentation was due to post dural puncture headache. Per ED physician, interventional radiology did not believe that the patient needed a blood patch at this time and recommended asking her to lay supine for the next 24 hours. -Admit to medsurg -Toradol 15 mg q6 prn; avoid opioids as patient is high risk for delirium -Bedrest with bathroom privileges. Encouraged patient to lay supine for the next 24 hours. -CRP pending  -We may need to consult IR tomorrow morning for a blood patch if there is no improvement of her HA  Lumbar radiculopathy: Patient received an epidural spinal injection over week ago which has failed to alleviate her pain. Per chart review, she was seen by orthopedics at [facility]. X-ray of lumbar spine done in 2015  showing mild compression deformity of the anterior superior aspect of L4 and degenerative disc disease at L5-S1. She is not able to get an MRI due to her pacemaker. -She will need outpatient follow-up with ortho/ neurosurgery   Hypertension: She is not currently on any home medications. Blood pressure 185/80 on arrival, likely worse in the setting of pain. Per chart review, she was previously started on low-dose amlodipine by cardiology in 08/2016. Unclear why or when it was stopped as there is no documentation. Patient does not remember taking this medication. BP continues to be elevated.  -Resume Amlodipine 2.5 mg daily   NICM: Echo in September 2016 showing left ventricular ejection fraction 50-55% and grade 1 diastolic dysfunction. Currently euvolemic on exam. -No acute intervention needed at this time  CHB s/p PPM: Being followed by cardiology on an outpatient basis.  COPD: Stable. Not currently on any home medications. -Continue to monitor  Prior CVA: Stable left-sided residual weakness. -Continue home aspirin 81 mg daily  Depression -Continue home duloxetine 30 mg daily   Diet: HH DVT ppx: Lovenox Code: Patient wishes to be DO NOT RESUSCITATE.  Dispo: Admit patient to Observation with expected length of stay less than 2 midnights.  Signed: Shela Leff, MD 09/01/2017, 2:45 PM  Pager: [phone]

## 2017-09-01 NOTE — Progress Notes (Signed)
Patient arrived to 5W 14 at 24. Patient oriented to unit with call bell in reach. No tele orders present at this time. Report given to oncoming nurse.

## 2017-09-01 NOTE — ED Notes (Signed)
Pt requesting something for pain Admitting MD notified

## 2017-09-01 NOTE — ED Notes (Signed)
Attempted to gain IV access twice, without success. Seeking additional assistance.

## 2017-09-02 ENCOUNTER — Encounter (HOSPITAL_COMMUNITY): Payer: Self-pay | Admitting: General Practice

## 2017-09-02 ENCOUNTER — Observation Stay (HOSPITAL_COMMUNITY): Payer: Medicare HMO

## 2017-09-02 DIAGNOSIS — Y844 Aspiration of fluid as the cause of abnormal reaction of the patient, or of later complication, without mention of misadventure at the time of the procedure: Secondary | ICD-10-CM

## 2017-09-02 DIAGNOSIS — R079 Chest pain, unspecified: Secondary | ICD-10-CM

## 2017-09-02 DIAGNOSIS — Z66 Do not resuscitate: Secondary | ICD-10-CM

## 2017-09-02 DIAGNOSIS — E785 Hyperlipidemia, unspecified: Secondary | ICD-10-CM

## 2017-09-02 DIAGNOSIS — I10 Essential (primary) hypertension: Secondary | ICD-10-CM

## 2017-09-02 DIAGNOSIS — I959 Hypotension, unspecified: Secondary | ICD-10-CM | POA: Diagnosis not present

## 2017-09-02 DIAGNOSIS — I77810 Thoracic aortic ectasia: Secondary | ICD-10-CM

## 2017-09-02 DIAGNOSIS — R9431 Abnormal electrocardiogram [ECG] [EKG]: Secondary | ICD-10-CM

## 2017-09-02 DIAGNOSIS — G971 Other reaction to spinal and lumbar puncture: Principal | ICD-10-CM

## 2017-09-02 DIAGNOSIS — I428 Other cardiomyopathies: Secondary | ICD-10-CM | POA: Diagnosis not present

## 2017-09-02 DIAGNOSIS — Z8673 Personal history of transient ischemic attack (TIA), and cerebral infarction without residual deficits: Secondary | ICD-10-CM

## 2017-09-02 DIAGNOSIS — I493 Ventricular premature depolarization: Secondary | ICD-10-CM

## 2017-09-02 DIAGNOSIS — R0789 Other chest pain: Secondary | ICD-10-CM | POA: Diagnosis not present

## 2017-09-02 DIAGNOSIS — Z86718 Personal history of other venous thrombosis and embolism: Secondary | ICD-10-CM

## 2017-09-02 DIAGNOSIS — J449 Chronic obstructive pulmonary disease, unspecified: Secondary | ICD-10-CM

## 2017-09-02 DIAGNOSIS — Z950 Presence of cardiac pacemaker: Secondary | ICD-10-CM

## 2017-09-02 LAB — CBC
HCT: 41.1 % (ref 36.0–46.0)
HEMOGLOBIN: 13.5 g/dL (ref 12.0–15.0)
MCH: 30.4 pg (ref 26.0–34.0)
MCHC: 32.8 g/dL (ref 30.0–36.0)
MCV: 92.6 fL (ref 78.0–100.0)
PLATELETS: 131 10*3/uL — AB (ref 150–400)
RBC: 4.44 MIL/uL (ref 3.87–5.11)
RDW: 14 % (ref 11.5–15.5)
WBC: 6.7 10*3/uL (ref 4.0–10.5)

## 2017-09-02 LAB — BASIC METABOLIC PANEL
ANION GAP: 13 (ref 5–15)
BUN: 19 mg/dL (ref 6–20)
CHLORIDE: 107 mmol/L (ref 101–111)
CO2: 16 mmol/L — ABNORMAL LOW (ref 22–32)
CREATININE: 1.06 mg/dL — AB (ref 0.44–1.00)
Calcium: 9.2 mg/dL (ref 8.9–10.3)
GFR calc non Af Amer: 44 mL/min — ABNORMAL LOW (ref >60)
GFR, EST AFRICAN AMERICAN: 52 mL/min — AB (ref >60)
Glucose, Bld: 118 mg/dL — ABNORMAL HIGH (ref 65–99)
POTASSIUM: 4.2 mmol/L (ref 3.5–5.1)
Sodium: 136 mmol/L (ref 135–145)

## 2017-09-02 LAB — TROPONIN I: Troponin I: <0.03 ng/mL (ref <0.03)

## 2017-09-02 MED ORDER — KETOROLAC TROMETHAMINE 30 MG/ML IJ SOLN
30.0000 mg | Freq: Once | INTRAMUSCULAR | Status: AC
  Administered 2017-09-02: 30 mg via INTRAVENOUS
  Filled 2017-09-02: qty 1

## 2017-09-02 MED ORDER — INFLUENZA VAC SPLIT HIGH-DOSE 0.5 ML IM SUSY: 0.5000 mL | PREFILLED_SYRINGE | INTRAMUSCULAR | Status: DC | PRN

## 2017-09-02 MED ORDER — SODIUM CHLORIDE 0.9 % IV BOLUS (SEPSIS)
1000.0000 mL | Freq: Once | INTRAVENOUS | Status: AC
  Administered 2017-09-02: 500 mL via INTRAVENOUS

## 2017-09-02 MED ORDER — IOPAMIDOL (ISOVUE-370) INJECTION 76%
INTRAVENOUS | Status: AC
  Administered 2017-09-02: 100 mL
  Filled 2017-09-02: qty 100

## 2017-09-02 MED ORDER — ACETAMINOPHEN-CODEINE #3 300-30 MG PO TABS
1.0000 | ORAL_TABLET | Freq: Four times a day (QID) | ORAL | Status: DC | PRN
  Administered 2017-09-02 – 2017-09-04 (×2): 1 via ORAL
  Filled 2017-09-02 (×2): qty 1

## 2017-09-02 MED ORDER — NITROGLYCERIN 0.4 MG SL SUBL
SUBLINGUAL_TABLET | SUBLINGUAL | Status: AC
  Filled 2017-09-02: qty 3

## 2017-09-02 MED ORDER — IOPAMIDOL (ISOVUE-370) INJECTION 76%
INTRAVENOUS | Status: AC
  Filled 2017-09-02: qty 100

## 2017-09-02 MED ORDER — NITROGLYCERIN 0.4 MG SL SUBL
0.4000 mg | SUBLINGUAL_TABLET | SUBLINGUAL | Status: DC | PRN
  Administered 2017-09-02 (×2): 0.4 mg via SUBLINGUAL

## 2017-09-02 MED ORDER — ONDANSETRON HCL 4 MG/2ML IJ SOLN
4.0000 mg | Freq: Four times a day (QID) | INTRAMUSCULAR | Status: DC | PRN
Start: 2017-09-02 — End: 2017-09-07
  Administered 2017-09-04: 4 mg via INTRAVENOUS
  Filled 2017-09-02: qty 2

## 2017-09-02 MED ORDER — ONDANSETRON HCL 4 MG/2ML IJ SOLN
4.0000 mg | Freq: Once | INTRAMUSCULAR | Status: AC
  Administered 2017-09-02: 4 mg via INTRAVENOUS
  Filled 2017-09-02: qty 2

## 2017-09-02 NOTE — Progress Notes (Signed)
Went to bedside prior to CT angio for reevaluation. Patient more alert and talkative. Family bedside. Discussed updates with family and the need to transfer to stepdown unit. Family expressed understanding. SBP = 130s.during evaluation and patient not diaphoretic or tachycardic.

## 2017-09-02 NOTE — Progress Notes (Signed)
Internal Medicine Attending:   I saw and examined the patient. I reviewed the resident's note and I agree with the resident's findings and plan as documented in the resident's note.  83 year old woman who was admitted for a post epidural injection headache that we were monitoring, developed new acute chest pain that radiated to her back shoulder blades today.  The internal medicine physicians evaluated her, obtained an ECG which is still A-V paced with wide QRS, and a few PVCs. She was given two serial doses of nitroglycerin, then developed hypotension as a result. We put her in trendelenburg positioning and gave IV bolus with improvement in her blood pressure after about 15 minutes. The patient was drowsy, but calm. Reporting the chest and upper back pain persisted, and her headache was also unchanged.   Cardiac POCUS showed a hyperdynamic LV with normal EF, the aortic root was dilated to 3.7 mm which is known from prior CT chest in August. There was no pericardial effusion. The RV looked normal to me and not dilated.   On exam she was warm in her extremities, no crackles on auscultation. Sleepy, but awakened to voice and answered questions appropriately.   Chest Pain:  Normal imaging stress evaluations in 2016 and 2017. ECG is hard to interpret because of V-pacing and wide QRS. Hypotension after nitro could be consistent with right sided ischemia. Will check troponin and consult cardiology. She also has prominent aortic atherosclerosis and a known mild thoracic ascending aortic aneurysm to 3.34mm. Given the acute pain radiating to her shoulder blades, I do want to rule out acute dissection with CT chest. PE is possible, but seems low risk, she has only been admitted for <24 hours and received one dose of lovenox.   Code status:  Patient confirmed DNR on admission and this remains appropriate given her advanced age. The patient did tell us she wants other testing done to find the cause of this  chest pain and to treat it. She asked we talk with her husband and her daughter which we will do.

## 2017-09-02 NOTE — Progress Notes (Signed)
Radiology   Attempted CTA was complicated by100 cc contrast extravasation in the left arm. Soft tissues are swollen but not tense.  No vascular compromise or skin changes.  Order set placed and discussed with nursing.  Attempts to place new IV and complete study are underway.  JWatts MD

## 2017-09-02 NOTE — Progress Notes (Signed)
Patient c/o dull chest pain upon waking up from a nap.  Pain 9/10 radiating to back of the neck. RR and MD notified. Stat EKG ordered. 2L/O2 applied, sats 98%. BP 173/72 P-62 at 1325. 1 nitro SL given per order at 1330, and second SL nitro given at 1335, pain lowered to 5/10, BP decreased to 51/28. NS bolus initiated. Voicemail left for patient's husband.

## 2017-09-02 NOTE — Progress Notes (Signed)
Patient complained of 4 out of 10 chest pain, that is constant.  Dr Frances Furbish notified.  MD stated to just monitor patient for now.

## 2017-09-02 NOTE — Progress Notes (Signed)
Patient complains of chest pain 9/10. Notified MD will continue to monitor.

## 2017-09-02 NOTE — Progress Notes (Signed)
   Subjective:  Patient was seen laying comfortably in bed this AM in no acute distress. She states that she continues to have pain in her head which occurs multiple times per hour. She also endorses nausea, which she states is   Objective:  Vital signs in last 24 hours: Vitals:   09/01/17 1730 09/01/17 1745 09/01/17 2000 09/02/17 0507  BP: (!) 169/72 (!) 173/75 (!) 167/66 (!) 175/92  Pulse: 60 63 62 64  Resp: 18 19 18 18   Temp:   98.5 F (36.9 C) (!) 97.4 F (36.3 C)  TempSrc:   Oral Oral  SpO2: 95% 96% 94% 95%  Weight:   139 lb 1.8 oz (63.1 kg)   Height:   5\' 6"  (1.676 m)    Physical Exam  Constitutional:  Elderly woman resting comfortably in bed elevated to 30 degrees in no acute distress.  HENT:  Mouth/Throat: Oropharynx is clear and moist.  Cardiovascular: Normal rate, regular rhythm and intact distal pulses.  Exam reveals no friction rub.   No murmur heard. Pulmonary/Chest: Effort normal. No respiratory distress. She has no wheezes.  No crackles in anterior lung fields  Abdominal: Soft. Bowel sounds are normal. She exhibits no distension. There is no tenderness. There is no guarding.  Musculoskeletal: She exhibits no edema (of bilateral lower extremities) or tenderness (of bilateral lower extremities).  Neurological:  Face strength and sensation intact bilaterally. Tongue midline. Gross motor and sensation to light touch of upper and lower extremities intact bilaterally.  Skin: Skin is warm and dry. No rash noted. No erythema.   Assessment/Plan:  Active Problems:   Post-dural puncture headache  [name] [name] is a 83 yo with a PMH of NICM, CHB s/p PPM (05/2015), HTN, HLD, COPD, CVA, prior DVT who presented with a chief complaint of headches that started 2-3 days after an epidural injection for treatment of chronic back pain. She was admitted to the internal medicine teaching service for management. The specific problems addressed during admission were as follows:  Acute  onset substernal chest pain: STAT CT angiography and troponin ordered per previous note. Will continue to monitor clinical status. -Transfer to Stepdown -Continue daily Aspirin 81 mg -Repeat EKG at 1500 -Follow up troponin -Follow up imaging studies  Post-dural puncture headache: The patient continues to endorse severe headaches which are not improved by medications or position. Will continue supportive care with PRN medications as tolerated and encourage patient to lay flat. Will continue to discuss option of autologous blood patch with IR for treatment of post-dural puncture headache to see if she would like to try this remedy if all else fails. -Continue to lay flat for treatment of dural puncture headache  HTN: Patient remains hypertensive 160-170s/60-70s overnight. Amlodipine 2.5 mg daily was restarted on admission, however BP remains elevated today. Will increase amlodipine for better BP control.  Fluids/Diet: None/HH VTE prophylaxis: Lovenox Code status: DNR  Dispo: Anticipated discharge in approximately 1-2 day(s).   Rozann Lesches, MD 09/02/2017, 12:01 PM Pager: [phone]

## 2017-09-02 NOTE — Progress Notes (Signed)
Paged by Nurse at 1:30 pm about 9/10 dull, substernal chest pain radiating to neck/back. Patient was diaphoretic on arrival, nauseated, and hade one episode of emesis. Patient STAT EKG was obtained and no signs of TWI or NSTEMI were observed compared to prior EKG. Patient already had aspirin 81 mg today. Patient was given sublingual nitro with improvement in her symptoms of chest pain (decreased to 5/10). STAT troponin, BMP, and CBC were then ordered.  Patient then shortly reported return of substernal chest pain, increased back to 9/10, and was given second dose of sublingual nitro. Patient then began complaining of dizziness and BP decreased to 50s/30s. 1L NS bolus initiated at this time and patient's legs elevated above head. POC bedside ultrasound showed no pericardial effusion, no signs of RV dysfunction, but mild aortic root dilation to 3 cm. STAT CT angiogram chest/abdomen/pelvis to rule out dissection and acute PE was ordered. Patient's husband notified of significant event and he agreed to come to hospital.

## 2017-09-02 NOTE — Progress Notes (Signed)
Report called to Shanda Bumps, RN on 6E for patient transfer. Patient currently at CT, will be going to 6E 07 after test. Family aware and items sent to new room.

## 2017-09-02 NOTE — Significant Event (Addendum)
Rapid Response Event Note  Overview: Time Called: 1327 Arrival Time: 1330 Event Type: Cardiac  Initial Focused Assessment: Patient complaining of Chest Pain 9/10, radiating to neck and arm Diaphoretic and nauseated, dry heaving Mild SOB Lung sounds clear, heart tones regular 173/72  AV paced 65  RR 20  O2 sats 100% on RA IM residents at bedside  Interventions: Placed on 2L [place], sats 100% 12 lead EKG done 1 sl ntg given CP improving 5/10 Labs drawn 2nd Sl ntg given BP 51/28  AV paced,  500cc NS bolus Attending at bedside Increasing CP 9/10, now describing it into her shoulder blades BP improved to 118/45 Per MD no additional NTG  Plan CTA chest and abdomen, RO dissection Attempted to start IV right ac, unsuccessful.  Tordol given for pain  1630 IV team started 20 ga IV with dopper left upper arm.  In Radiology for CTA chest, contrast extravasation.  Unable to start new IV.  Transported to 0H40. Patient continues to complain of CP 4/10 and now upper arm pain. Updated RN on patient status.  Plan of Care (if not transferred):  Event Summary: Name of Physician Notified: Internal Medicine Residents at bedside,   at      at    Outcome:  [phone])  Event End Time: 1430  Marcellina Millin

## 2017-09-02 NOTE — Consult Note (Signed)
Cardiology Consultation:   Patient ID: [name] [name]; [id number]; 1924-05-20   Admit date: 09/01/2017 Date of Consult: 09/02/2017  Primary Care Provider: Algis Greenhouse, MD Primary Cardiologist: Dr. Caryl Comes Primary Electrophysiologist:  Dr. Caryl Comes    Patient Profile:   [name] [name] is a 83 y.o. female with a hx of CHB w/PP, hx of palpitations associated with non-sustained atrial arrhythmias and NSVT, bronchiectasis, COPD, HTN, HLD, CVA 2013 hemorrhagic another CVA in march 2017 started on Eliquis though intolerant w/nausea  who is being seen today for the evaluation of chest pain at the request of Dr. Evette Doffing.  History of Present Illness:   [name] [name] has a hx of CHB w/PP (MDT), hx of palpitations associated with non-sustained atrial arrhythmias and NSVT, bronchiectasis, COPD, HTN, HLD, CVA 2013 hemorrhagic another CVA in march 2017 started on Eliquis though intolerant w/nausea and then placed on Xarelto. On last OV thought was to stop Xarelto which it has been stopped.  Most of her tachycardia was a tach.. (Hx of normal stress nuc study in 2015)  Cath 2011 without obstructive disease. nuc in 2017 July, no ischemia or infarction, normal LV wall motion, EF 70%  Now admitted 09/01/17 after presenting to ER with headache after epidural injection on Monday 08/23/17.  She has headache off and on since.  Her leg is still painful she did have a few hours of pain free in her leg.      BP was elevated at 169/74, CT Head with no bleed and no acute process, she was admitted and today H/A continued.    By 1400 she developed new acute chest pain with radiation to her back and shoulder blades. One episode of emesis.  EKG with AV pacing no changes and PVCs, I personally reviewed. Telemetry:  Telemetry was personally reviewed and demonstrates:    NTG given with improvement of symptoms from 9/10 to 5/10.  Then another episodes of chest pain with increase of pain and another dose of SL NTG and BP down to  50/30.  1 L NS given.  POC bedside ultrasound revealed no pericardial effusion no signs of RV dysfunction, but mild aortic root dilatation to 3 cm.  CTA of chest ordered stat Troponin neg < 0.03 Cr 1.06 K+ 4.2 Plts 131, hgb 13.5   Have not been able to do CTA due to IV infiltrate. CT without contrast, no aortic aneurysm or displaced intimal calcifications. Calcified coronary artery and aortic atherosclerosis.      Pt is a DNR  Currently may have some mild chest discomfort no further nausea.  At home over last few weeks she has had some chest discomfort with rest and exertion. Rest more and it goes away.  She is unsure if this is why they did nuc study last year.   Resting now.   Past Medical History:  Diagnosis Date  . Arthritis   . Bronchiectasis, non-tuberculous ([place])    a. Followed by Dr. Joya Gaskins.  . Colon polyps   . Complete heart block (HCC)    a. s/p MDT dual chamber PPM   . COPD (chronic obstructive pulmonary disease) ([place])    past hx with pneumonia  . Diverticulosis   . DVT (deep venous thrombosis) (H[facility])    Resolved, no anticoagulation currently  . GERD (gastroesophageal reflux disease)   . Headache   . High cholesterol   . Hypertension   . IBS (irritable bowel syndrome)   . 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 MI (  myocardial infarction) ?? probably not 1975   a. 1975 Reported MI;  b. 2011 Cath: nonobs dzs;  c. 2013 Nonischemic Myoview;  d. 12/2012 Echo: EF 60-65%, Gr1 DD.  . Nephrolithiasis   . Palpitation    a. 2010 Event Monitor: PACs and PVCs  . Pneumonia   . PONV (postoperative nausea and vomiting)   . Post-dural puncture headache 09/01/2017  . Presence of permanent cardiac pacemaker   . Pulmonary fibrosis ([place])   . Stroke ([place])   . Wears dentures    full  . Wide-complex tachycardia ([place])    a. Followed by Dr. Caryl Comes, "I think this is VT, but i am not sure QRSd 135 1:1 AV."    Past Surgical History:  Procedure Laterality Date  .  APPENDECTOMY    . BLADDER SUSPENSION     tack  . CARDIAC CATHETERIZATION Right 05/14/2015   Procedure: Temporary Pacemaker;  Surgeon: Thompson Grayer, MD;  Location: [facility];  Service: Cardiovascular;  Laterality: Right;  . CHOLECYSTECTOMY    . COLON SURGERY     12inches colectomy s/p diverticulitis  . COLONOSCOPY W/ BIOPSIES AND POLYPECTOMY    . EP IMPLANTABLE DEVICE N/A 05/14/2015   Procedure: Pacemaker Implant;  Surgeon: Thompson Grayer, MD; [place] 1 (serial number [id number])     . KNEE ARTHROSCOPY Left 03/29/2017   Procedure: ARTHROSCOPY LEFT KNEE WITH PARTIAL MEDIAL AND PARTIAL LATERAL MENISCECTOMY;  Surgeon: Vickey Huger, MD;  Location: [facility];  Service: Orthopedics;  Laterality: Left;  Marland Kitchen MULTIPLE TOOTH EXTRACTIONS    . TONSILLECTOMY    . TOTAL ABDOMINAL HYSTERECTOMY    . TOTAL KNEE ARTHROPLASTY  12/12/2012   Procedure: TOTAL KNEE ARTHROPLASTY;  Surgeon: Alta Corning, MD;  Location: [facility];  Service: Orthopedics;  Laterality: Right;  right total knee arthroplasty     Home Medications:  Prior to Admission medications   Medication Sig Start Date End Date Taking? Authorizing Provider  aspirin EC 81 MG tablet Take 1 tablet (81 mg total) by mouth daily. 05/14/17  Yes Deboraha Sprang, MD  DULoxetine (CYMBALTA) 30 MG capsule Take 30 mg by mouth daily.   Yes [provider]  traMADol (ULTRAM) 50 MG tablet Take 50 mg by mouth every 6 (six) hours as needed for moderate pain.  08/30/17  Yes [provider]  vitamin B-12 (CYANOCOBALAMIN) 1000 MCG tablet Take 1,000 mcg by mouth daily.   Yes [provider]  Respiratory Therapy Supplies (FLUTTER) DEVI See admin instructions. Exercise lungs three to four (3-4) times a day as needed for flutter    [provider]    Inpatient Medications: Scheduled Meds: . aspirin EC  81 mg Oral Daily  . DULoxetine  30 mg Oral Daily  . iopamidol      . vitamin B-12  1,000 mcg Oral Daily   Continuous  Infusions:  PRN Meds: acetaminophen-codeine, Influenza vac split quadrivalent PF, ketorolac, ondansetron (ZOFRAN) IV  Allergies:    Allergies  Allergen Reactions  . Crestor [Rosuvastatin] Other (See Comments)    Myalgia  . Darvon Other (See Comments)    Drop in  BP  . Meloxicam Other (See Comments)    Dizziness   . Meperidine Other (See Comments)    Hypotension  . Prednisone Diarrhea    POLYURIA  . Valsartan Other (See Comments)     Hypotension  . Amiodarone Other (See Comments)    Did not tolerate - unknown reaction  .  Aspirin-Acetaminophen-Caffeine Other (See Comments)    Unknown reaction  . Nitroglycerin     Profound hypotension with sublingual nitroglycerin.   . Tramadol Other (See Comments)    Unknown reaction    Social History:   Social History   Social History  . Marital status: Married    Spouse name: N/A  . Number of children: 3  . Years of education: N/A   Occupational History  . Retired     [employer]   Social History Main Topics  . Smoking status: Never Smoker  . Smokeless tobacco: Never Used  . Alcohol use No  . Drug use: No  . Sexual activity: Not on file   Other Topics Concern  . Not on file   Social History Narrative  . No narrative on file    Family History:    Family History  Problem Relation Age of Onset  . Heart disease Father   . Heart attack Mother   . Stomach cancer Brother   . Other Son        Triple Bypass     ROS:  Please see the history of present illness.  ROS  General:no colds or fevers, no weight changes Skin:no rashes or ulcers HEENT:no blurred vision, no congestion CV:see HPI PUL:see HPI GI:no diarrhea constipation or melena, no indigestion GU:no hematuria, no dysuria MS:no joint pain, no claudication Neuro:no syncope, no lightheadedness Endo:no diabetes, no thyroid disease   Physical Exam/Data:   Vitals:   09/02/17 1459 09/02/17 1500 09/02/17 1545 09/02/17 1655  BP: (!) 112/57 (!) 130/58  (!) 136/57 (!) 173/65  Pulse:  60 (!) 59   Resp:      Temp:  (!) 97.5 F (36.4 C)  97.9 F (36.6 C)  TempSrc:  Oral  Axillary  SpO2:  100%  93%  Weight:      Height:        Intake/Output Summary (Last 24 hours) at 09/02/17 1715 Last data filed at 09/02/17 1102  Gross per 24 hour  Intake              360 ml  Output              700 ml  Net             -340 ml   Filed Weights   09/01/17 0537 09/01/17 2000  Weight: 138 lb (62.6 kg) 139 lb 1.8 oz (63.1 kg)   Body mass index is 22.45 kg/m.  General:  Frail female, in no acute distress  HEENT: normal Lymph: no adenopathy Neck: no JVD Endocrine:  No thryomegaly Vascular: No carotid bruits; 2+ pedal pulses Bil. No lower ext edema Cardiac:  normal S1, S2; RRR; no murmur gallup rub or click Lungs:  clear to auscultation bilaterally ant. , no wheezing, rhonchi or rales  Abd: soft, nontender, no hepatomegaly  Ext: no edema Musculoskeletal:  No deformities, BUE and BLE strength normal and equal Skin: warm and dry  Neuro:  Alert and oriented X 3 MAE follows commands , no focal abnormalities noted Psych:  Normal affect      Relevant CV Studies: ECHO 07/29/15 Study Conclusions  - Left ventricle: The cavity size was normal. Systolic function was   normal. The estimated ejection fraction was in the range of 50%   to 55%. Wall motion was normal; there were no regional wall   motion abnormalities. Doppler parameters are consistent with   abnormal left ventricular relaxation (grade 1 diastolic  dysfunction). - Ventricular septum: Septal motion showed paradox. - Aortic valve: There was no regurgitation. - Aortic root: The aortic root was normal in size. - Mitral valve: Structurally normal valve. There was mild   regurgitation. - Right ventricle: The cavity size was normal. Wall thickness was   normal. Pacer wire or catheter noted in right ventricle. Systolic   function was normal. - Right atrium: Pacer wire or catheter noted  in right atrium. - Tricuspid valve: There was trivial regurgitation. - Pulmonic valve: Structurally normal valve. There was no   regurgitation. - Pulmonary arteries: The main pulmonary artery was normal-sized.   Systolic pressure was within the normal range. - Inferior vena cava: The vessel was normal in size. - Pericardium, extracardiac: There was no pericardial effusion.  Laboratory Data:  Chemistry Recent Labs Lab 09/01/17 0650 09/02/17 1344  NA 137 136  K 3.8 4.2  CL 105 107  CO2 26 16*  GLUCOSE 99 118*  BUN 19 19  CREATININE 1.02* 1.06*  CALCIUM 9.2 9.2  GFRNONAA 47* 44*  GFRAA 54* 52*  ANIONGAP 6 13     Recent Labs Lab 09/01/17 0650  PROT 6.2*  ALBUMIN 3.8  AST 22  ALT 16  ALKPHOS 51  BILITOT 0.9   Hematology Recent Labs Lab 09/01/17 0650 09/02/17 1350  WBC 5.6 6.7  RBC 4.61 4.44  HGB 14.1 13.5  HCT 42.9 41.1  MCV 93.1 92.6  MCH 30.6 30.4  MCHC 32.9 32.8  RDW 14.1 14.0  PLT 145* 131*   Cardiac Enzymes Recent Labs Lab 09/02/17 1344  TROPONINI <0.03   No results for input(s): TROPIPOC in the last 168 hours.  BNPNo results for input(s): BNP, PROBNP in the last 168 hours.  DDimer No results for input(s): DDIMER in the last 168 hours.  Radiology/Studies:  Ct Head Wo Contrast  Result Date: 09/01/2017 CLINICAL DATA:  Vertex headache with weakness, dizziness and vomiting since spinal injection 1 week ago. EXAM: CT HEAD WITHOUT CONTRAST TECHNIQUE: Contiguous axial images were obtained from the base of the skull through the vertex without intravenous contrast. COMPARISON:  07/09/2017 FINDINGS: Brain: Generalized brain atrophy. Chronic small-vessel ischemic changes of the pons. Few old small vessel cerebellar infarctions. Old infarction in the left occipital lobe. Chronic small-vessel ischemic changes of the hemispheric white matter. Bilateral low-density hygromas. No sign of acute subdural process. No hydrocephalus. No hemorrhage or mass lesion.  Vascular: There is atherosclerotic calcification of the major vessels at the base of the brain. Skull: Negative Sinuses/Orbits: Clear/normal Other: None IMPRESSION: No change. Chronic small-vessel ischemic changes. Old left occipital infarction. Generalized atrophy. Bilateral subdural hygromas. No finding seen to explain the acute headache or as a complication of recent spinal injection. Electronically Signed   By: Nelson Chimes M.D.   On: 09/01/2017 07:34    Assessment and Plan:   1. Chest pain - severe- serial troponins,  Currently stable.  Monitor with tele.  With hypotension with NTG would hold, pain meds only.  Previously neg cath in 2011 and neg nuc in 2017.  Keep on ASA 81 mg.  Would not add heparin with neg troponin, with recent hypotension would hold adding BB  (hx of possible remote MI in 1975 but no evidence of this on cath.  If BP remains elevated use BB instead of amlodipine.  She was on coreg in past.   Dr. Harrington Challenger to see. 2. Hx CHB with PPM stable 3. Hx atrial tach none recently monitor 4. HTN with this admit  on amlodipine- previously no BP meds 08/10/17  5. Leg pain with epidural injection 6. Headache post epidural.    For questions or updates, please contact [facility] Please consult [email or web address] for contact info under Cardiology/STEMI.   Signed, Cecilie Kicks, NP  09/02/2017 5:15 PM\\  Pt seen and examined  I agree with findings as noted by L IN[place] above Pt admitted for HA Pt developed CP earlier today  Given NTG and BP dropped to 50/ Currently with minimal chest discomforta ON exam:  Lungs: CTA  Cardiac RRR  No S3  Abd Benign  Ext without edema  2+DP  No edema  Agree with plans for supportive care.  Follow BP  Track enzymes  May indeed see a bump ith such a drop in BP   Avoid in future   [employer]

## 2017-09-03 DIAGNOSIS — G971 Other reaction to spinal and lumbar puncture: Secondary | ICD-10-CM | POA: Diagnosis not present

## 2017-09-03 DIAGNOSIS — E785 Hyperlipidemia, unspecified: Secondary | ICD-10-CM | POA: Diagnosis not present

## 2017-09-03 DIAGNOSIS — Z66 Do not resuscitate: Secondary | ICD-10-CM | POA: Diagnosis not present

## 2017-09-03 DIAGNOSIS — Y844 Aspiration of fluid as the cause of abnormal reaction of the patient, or of later complication, without mention of misadventure at the time of the procedure: Secondary | ICD-10-CM | POA: Diagnosis not present

## 2017-09-03 DIAGNOSIS — I10 Essential (primary) hypertension: Secondary | ICD-10-CM | POA: Diagnosis not present

## 2017-09-03 DIAGNOSIS — Z950 Presence of cardiac pacemaker: Secondary | ICD-10-CM | POA: Diagnosis not present

## 2017-09-03 DIAGNOSIS — R079 Chest pain, unspecified: Secondary | ICD-10-CM | POA: Diagnosis not present

## 2017-09-03 DIAGNOSIS — I428 Other cardiomyopathies: Secondary | ICD-10-CM | POA: Diagnosis not present

## 2017-09-03 DIAGNOSIS — R9431 Abnormal electrocardiogram [ECG] [EKG]: Secondary | ICD-10-CM | POA: Diagnosis not present

## 2017-09-03 DIAGNOSIS — Z86718 Personal history of other venous thrombosis and embolism: Secondary | ICD-10-CM | POA: Diagnosis not present

## 2017-09-03 DIAGNOSIS — Z8673 Personal history of transient ischemic attack (TIA), and cerebral infarction without residual deficits: Secondary | ICD-10-CM | POA: Diagnosis not present

## 2017-09-03 LAB — LIPID PANEL
Cholesterol: 187 mg/dL (ref 0–200)
HDL: 64 mg/dL (ref >40)
LDL CALC: 109 mg/dL — AB (ref 0–99)
Total CHOL/HDL Ratio: 2.9 RATIO
Triglycerides: 71 mg/dL (ref <150)
VLDL: 14 mg/dL (ref 0–40)

## 2017-09-03 LAB — BASIC METABOLIC PANEL
ANION GAP: 10 (ref 5–15)
BUN: 23 mg/dL — AB (ref 6–20)
CALCIUM: 9.4 mg/dL (ref 8.9–10.3)
CO2: 23 mmol/L (ref 22–32)
Chloride: 103 mmol/L (ref 101–111)
Creatinine, Ser: 1.02 mg/dL — ABNORMAL HIGH (ref 0.44–1.00)
GFR calc Af Amer: 54 mL/min — ABNORMAL LOW (ref >60)
GFR calc non Af Amer: 47 mL/min — ABNORMAL LOW (ref >60)
GLUCOSE: 109 mg/dL — AB (ref 65–99)
Potassium: 4.1 mmol/L (ref 3.5–5.1)
Sodium: 136 mmol/L (ref 135–145)

## 2017-09-03 LAB — TROPONIN I: Troponin I: <0.03 ng/mL (ref <0.03)

## 2017-09-03 MED ORDER — ENSURE ENLIVE PO LIQD
237.0000 mL | Freq: Two times a day (BID) | ORAL | Status: DC
  Administered 2017-09-05 – 2017-09-07 (×4): 237 mL via ORAL

## 2017-09-03 MED ORDER — AMLODIPINE BESYLATE 2.5 MG PO TABS
2.5000 mg | ORAL_TABLET | Freq: Every day | ORAL | Status: DC
  Administered 2017-09-04 – 2017-09-07 (×4): 2.5 mg via ORAL
  Filled 2017-09-03 (×4): qty 1

## 2017-09-03 NOTE — Progress Notes (Signed)
Initial Nutrition Assessment  DOCUMENTATION CODES:   Not applicable  INTERVENTION:   - Chocolate Ensure Enlive BID; each supplement provides 350 kcals and 20 grams of protein  NUTRITION DIAGNOSIS:   Increased nutrient needs related to acute illness (s/p epidural injection, nausea, headaches) as evidenced by estimated needs, mild fat depletion, mild muscle depletion.  GOAL:   Patient will meet greater than or equal to 90% of their needs  MONITOR:   PO intake, Supplement acceptance, Labs, Weight trends  REASON FOR ASSESSMENT:   Malnutrition Screening Tool    ASSESSMENT:   Pt is a 83 year old female admitted for severe headaches r/t to a spinal epidural injection that was administered 9 days ago for chronic back pain. Pts headache began one day after the injection. PMH of NICM, CHB s/p PPM, HTN, HLD, COPD, CVA, and prior DVT.  At home pt typically eats cereal or sausage and eggs for breakfast, snack foods for lunch, and meat, rice, and vegetables for dinner. Pt did not notice a decrease in appetite or intake over the past 10 days as she typically doesn't eat a whole lot. Pt reported nausea during that time but said that she ate normally anyway. Pt denies emesis except one episode yesterday. Denies constipation and diarrhea.   Pt said her appetite decreased once hospitalized d/t her headaches. Reports appetite is coming back and that she was able to eat her breakfast. Per chart, pt ate 100% of breakfast.  Pt reports losing about 7 lbs in the past month but is unsure why. Per weight records in chart, pt weighed 140 lbs on 10/26, 141 lbs on 10/2, and 143 lbs on 5/21.   Medications- Vitamin B12  Labs- Glucose 109 (H), BUN 23 (H), Cr 1.02 (H), GFR 47 (L)  Nutrition Focused Physical Exam Findings: Mild fat depletion and mild muscle depletion, likely attributed to advanced age. Mild edema.  Diet Order:  Diet Heart Room service appropriate? Yes; Fluid consistency: Thin  EDUCATION  NEEDS:   No education needs have been identified at this time  Skin:  Skin integrity issues: extravasation on L arm  Last BM:  08/31/17  Height:   Ht Readings from Last 1 Encounters:  09/01/17 5\' 6"  (1.676 m)    Weight:   Wt Readings from Last 1 Encounters:  09/03/17 140 lb 3.4 oz (63.6 kg)    Ideal Body Weight:  59.1 kg  BMI:  Body mass index is 22.63 kg/m.  Estimated Nutritional Needs:   Kcal:  1300-1500 kcals  Protein:  65-75 grams   Fluid:  1.3-1.5 L  Wynetta Emery [facility] Dietetic Intern Pager: [phone] 09/03/2017 12:52 PM

## 2017-09-03 NOTE — Care Management Obs Status (Signed)
MEDICARE OBSERVATION STATUS NOTIFICATION   Patient Details  Name: [name] [name] MRN: [id number] Date of Birth: October 09, 1924   Medicare Observation Status Notification Given:  Yes    Gala Lewandowsky, RN 09/03/2017, 2:55 PM

## 2017-09-03 NOTE — Progress Notes (Addendum)
NIGHT TEAM INTERVAL PROGRESS NOTE  Asked by day-team to check-in on Mrs. Sondgeroth after sign out this evening due to concern for chest pain. Upon entering room the patient was resting comfortably in bed watching TV. VSS and she was on 2L O2 via [place]. She notes she had some CP a while ago however resolved spontaneously and denies further episodes. She had no complaints and was eager to socialize. Thankful for night teams re-assessment and aware of her stress test tomorrow. Informed to notify RN should status change and we would be happy to re-assess at bedside throughout the night.  Noemi Chapel, DO Internal Medicine - PGY2

## 2017-09-03 NOTE — Progress Notes (Signed)
CT Supervisor spoke to [employer]. She states patients arm less swollen and no complications.

## 2017-09-03 NOTE — Progress Notes (Signed)
Progress Note  Patient Name: [name] [name] Date of Encounter: 09/03/2017  Primary Cardiologist: Graciela HusbandsKlein  Subjective   Sl chest pressure  Breathing OK  MIld HA   Inpatient Medications    Scheduled Meds: . aspirin EC  81 mg Oral Daily  . DULoxetine  30 mg Oral Daily  . vitamin B-12  1,000 mcg Oral Daily   Continuous Infusions:  PRN Meds: acetaminophen-codeine, Influenza vac split quadrivalent PF, ketorolac, ondansetron (ZOFRAN) IV   Vital Signs    Vitals:   09/02/17 1655 09/02/17 2025 09/02/17 2324 09/03/17 0451  BP: (!) 173/65 (!) 148/78 (!) 159/72 (!) 165/62  Pulse:  69 68 68  Resp:  18 19 19   Temp: 97.9 F (36.6 C) (!) 97.3 F (36.3 C) (!) 97.5 F (36.4 C) 98.1 F (36.7 C)  TempSrc: Axillary Oral Oral Oral  SpO2: 93% 97% 100% 93%  Weight:    140 lb 3.4 oz (63.6 kg)  Height:        Intake/Output Summary (Last 24 hours) at 09/03/17 0651 Last data filed at 09/03/17 0500  Gross per 24 hour  Intake              360 ml  Output             1000 ml  Net             -640 ml   Filed Weights   09/01/17 0537 09/01/17 2000 09/03/17 0451  Weight: 138 lb (62.6 kg) 139 lb 1.8 oz (63.1 kg) 140 lb 3.4 oz (63.6 kg)    Telemetry    SR   - Personally Reviewed  ECG      Physical Exam   GEN: No acute distress.   Neck: No JVD Cardiac: RRR, no murmurs, rubs, or gallops.  Respiratory: Clear to auscultation bilaterally. GI: Soft, nontender, non-distended  MS: No edema in legs  ; No deformity. Neuro:  Nonfocal  Psych: Normal affect   Labs    Chemistry Recent Labs Lab 09/01/17 0650 09/02/17 1344 09/03/17 0335  NA 137 136 136  K 3.8 4.2 4.1  CL 105 107 103  CO2 26 16* 23  GLUCOSE 99 118* 109*  BUN 19 19 23*  CREATININE 1.02* 1.06* 1.02*  CALCIUM 9.2 9.2 9.4  PROT 6.2*  --   --   ALBUMIN 3.8  --   --   AST 22  --   --   ALT 16  --   --   ALKPHOS 51  --   --   BILITOT 0.9  --   --   GFRNONAA 47* 44* 47*  GFRAA 54* 52* 54*  ANIONGAP 6 13 10       Hematology Recent Labs Lab 09/01/17 0650 09/02/17 1350  WBC 5.6 6.7  RBC 4.61 4.44  HGB 14.1 13.5  HCT 42.9 41.1  MCV 93.1 92.6  MCH 30.6 30.4  MCHC 32.9 32.8  RDW 14.1 14.0  PLT 145* 131*    Cardiac Enzymes Recent Labs Lab 09/02/17 1344 09/02/17 1806 09/02/17 2355 09/03/17 0335  TROPONINI <0.03 <0.03 <0.03 <0.03   No results for input(s): TROPIPOC in the last 168 hours.   BNPNo results for input(s): BNP, PROBNP in the last 168 hours.   DDimer No results for input(s): DDIMER in the last 168 hours.   Radiology    Ct Abdomen Pelvis Wo Contrast  Result Date: 09/02/2017 CLINICAL DATA:  Severe substernal chest pain radiating to the back. Negative EKG.  Clinical concern for aortic dissection or pulmonary embolism. The examination was ordered as a CTA of the chest, abdomen and pelvis. However, the administered contrast extravasated into the left arm with no intravascular contrast demonstrated on the images. The patient was examined by Dr. Benito Mccreedy who document the extravasation and wrote post extravasation orders. Attempts to reestablish intravenous access for the study were unsuccessful. EXAM: CT CHEST, ABDOMEN AND PELVIS WITHOUT CONTRAST TECHNIQUE: Multidetector CT imaging of the chest, abdomen and pelvis was performed following the standard protocol without IV contrast. COMPARISON:  Chest CTA dated 06/27/2017. Pelvis CT dated 06/24/2017. Abdomen and pelvis CT dated 03/08/2015. FINDINGS: CT CHEST FINDINGS Cardiovascular: Atheromatous arterial calcifications, including the aorta and coronary arteries. No aortic aneurysm or displaced intimal calcifications seen. Mildly enlarged heart. Left subclavian pacemaker leads. Mediastinum/Nodes: No enlarged mediastinal, hilar, or axillary lymph nodes. Thyroid gland, trachea, and esophagus demonstrate no significant findings. Lungs/Pleura: Lingular and right middle lobe cylindrical bronchiectasis with associated parenchymal densities without  significant change. Small amount of tree in bud opacity in the right lower lobe without significant change. Mild bibasilar linear atelectasis or scarring without significant change. The previously demonstrated 6 x 6 mm subpleural nodule in the right upper lobe is significantly smaller, measuring 5 x 3 mm on image number 16 series 6. The previously demonstrated 6 x 6 mm left lower lobe subpleural nodular density is currently partially obscured by minimal dependent atelectasis, without gross change. There are additional smaller nodules in the right lower lobe posteriorly on image number 36 series 6, without significant change. No pleural fluid is seen. Musculoskeletal: Thoracic spine degenerative changes. CT ABDOMEN PELVIS FINDINGS Hepatobiliary: No focal liver abnormality is seen. Status post cholecystectomy. No biliary dilatation. Pancreas: Moderate diffuse pancreatic atrophy. Spleen: Normal in size without focal abnormality. Adrenals/Urinary Tract: Multiple left renal parapelvic cysts are again demonstrated. No definite hydronephrosis. No urinary tract calculi. Normal appearing adrenal glands. Stomach/Bowel: Small hiatal hernia. Scattered colonic diverticula. Surgically absent appendix. Unremarkable small bowel. Vascular/Lymphatic: Atheromatous arterial calcifications without aneurysm or displaced intimal calcifications. No enlarged lymph nodes. Reproductive: Status post hysterectomy. No adnexal masses. Other: Anterior hernia repair mesh.  No hernia seen at this time. Musculoskeletal: L5-S1 degenerative changes. Approximately 40% L4 superior endplate compression deformity with mild bony retropulsion, without significant change since 03/08/2015. No acute fracture the lines. IMPRESSION: 1. Unsuccessful CTA of the chest, abdomen pelvis due to the contrast extravasation and inability to subsequently establish intravenous access for the CTA. 2. No aortic aneurysm or displaced intimal calcifications. 3. Decreased size of  the previously demonstrated small right upper lobe nodule and no gross change in a small left lower lobe nodule, partially obscured by dependent atelectasis today and stable several small right lower lobe nodules. 4. Stable mild tree-in-bud opacities in the right lower lobe. This could be due to bronchiolitis. 5. Stable bronchiectasis in the right middle lobe and lingula. 6. Moderate diffuse pancreatic atrophy. 7. Small hiatal hernia. 8. Calcified coronary artery and aortic atherosclerosis. Aortic Atherosclerosis (ICD10-I70.0). Electronically Signed   By: Beckie Salts M.D.   On: 09/02/2017 17:35   Ct Head Wo Contrast  Result Date: 09/01/2017 CLINICAL DATA:  Vertex headache with weakness, dizziness and vomiting since spinal injection 1 week ago. EXAM: CT HEAD WITHOUT CONTRAST TECHNIQUE: Contiguous axial images were obtained from the base of the skull through the vertex without intravenous contrast. COMPARISON:  07/09/2017 FINDINGS: Brain: Generalized brain atrophy. Chronic small-vessel ischemic changes of the pons. Few old small vessel cerebellar infarctions. Old infarction in  the left occipital lobe. Chronic small-vessel ischemic changes of the hemispheric white matter. Bilateral low-density hygromas. No sign of acute subdural process. No hydrocephalus. No hemorrhage or mass lesion. Vascular: There is atherosclerotic calcification of the major vessels at the base of the brain. Skull: Negative Sinuses/Orbits: Clear/normal Other: None IMPRESSION: No change. Chronic small-vessel ischemic changes. Old left occipital infarction. Generalized atrophy. Bilateral subdural hygromas. No finding seen to explain the acute headache or as a complication of recent spinal injection. Electronically Signed   By: Paulina Fusi M.D.   On: 09/01/2017 07:34   Ct Chest Wo Contrast  Result Date: 09/02/2017 CLINICAL DATA:  Severe substernal chest pain radiating to the back. Negative EKG. Clinical concern for aortic dissection or  pulmonary embolism. The examination was ordered as a CTA of the chest, abdomen and pelvis. However, the administered contrast extravasated into the left arm with no intravascular contrast demonstrated on the images. The patient was examined by Dr. Benito Mccreedy who document the extravasation and wrote post extravasation orders. Attempts to reestablish intravenous access for the study were unsuccessful. EXAM: CT CHEST, ABDOMEN AND PELVIS WITHOUT CONTRAST TECHNIQUE: Multidetector CT imaging of the chest, abdomen and pelvis was performed following the standard protocol without IV contrast. COMPARISON:  Chest CTA dated 06/27/2017. Pelvis CT dated 06/24/2017. Abdomen and pelvis CT dated 03/08/2015. FINDINGS: CT CHEST FINDINGS Cardiovascular: Atheromatous arterial calcifications, including the aorta and coronary arteries. No aortic aneurysm or displaced intimal calcifications seen. Mildly enlarged heart. Left subclavian pacemaker leads. Mediastinum/Nodes: No enlarged mediastinal, hilar, or axillary lymph nodes. Thyroid gland, trachea, and esophagus demonstrate no significant findings. Lungs/Pleura: Lingular and right middle lobe cylindrical bronchiectasis with associated parenchymal densities without significant change. Small amount of tree in bud opacity in the right lower lobe without significant change. Mild bibasilar linear atelectasis or scarring without significant change. The previously demonstrated 6 x 6 mm subpleural nodule in the right upper lobe is significantly smaller, measuring 5 x 3 mm on image number 16 series 6. The previously demonstrated 6 x 6 mm left lower lobe subpleural nodular density is currently partially obscured by minimal dependent atelectasis, without gross change. There are additional smaller nodules in the right lower lobe posteriorly on image number 36 series 6, without significant change. No pleural fluid is seen. Musculoskeletal: Thoracic spine degenerative changes. CT ABDOMEN PELVIS FINDINGS  Hepatobiliary: No focal liver abnormality is seen. Status post cholecystectomy. No biliary dilatation. Pancreas: Moderate diffuse pancreatic atrophy. Spleen: Normal in size without focal abnormality. Adrenals/Urinary Tract: Multiple left renal parapelvic cysts are again demonstrated. No definite hydronephrosis. No urinary tract calculi. Normal appearing adrenal glands. Stomach/Bowel: Small hiatal hernia. Scattered colonic diverticula. Surgically absent appendix. Unremarkable small bowel. Vascular/Lymphatic: Atheromatous arterial calcifications without aneurysm or displaced intimal calcifications. No enlarged lymph nodes. Reproductive: Status post hysterectomy. No adnexal masses. Other: Anterior hernia repair mesh.  No hernia seen at this time. Musculoskeletal: L5-S1 degenerative changes. Approximately 40% L4 superior endplate compression deformity with mild bony retropulsion, without significant change since 03/08/2015. No acute fracture the lines. IMPRESSION: 1. Unsuccessful CTA of the chest, abdomen pelvis due to the contrast extravasation and inability to subsequently establish intravenous access for the CTA. 2. No aortic aneurysm or displaced intimal calcifications. 3. Decreased size of the previously demonstrated small right upper lobe nodule and no gross change in a small left lower lobe nodule, partially obscured by dependent atelectasis today and stable several small right lower lobe nodules. 4. Stable mild tree-in-bud opacities in the right lower lobe. This could be  due to bronchiolitis. 5. Stable bronchiectasis in the right middle lobe and lingula. 6. Moderate diffuse pancreatic atrophy. 7. Small hiatal hernia. 8. Calcified coronary artery and aortic atherosclerosis. Aortic Atherosclerosis (ICD10-I70.0). Electronically Signed   By: Beckie Salts M.D.   On: 09/02/2017 17:35    Cardiac Studies     Patient Profile     83 y.o. female with history of CHB (s/p PPM), COPD, HTN, HL, CVA(2013 hemorrhagic;  2017 nonhemorrhagic therefore on Eliquis).  Admitted for Rx of HA post spinal injection  Developed CP (10/25) and with NTG prfound hypotension.  Recovered    Assessment & Plan    1  CP  Troponins have remained negative even with hypotension   CT yesterday did show atherosclerosis of coronary arteries  But, I would treat conservatively  CUrrent chest pressure I am not convinced ischemic  Would continue meds  Avoid sudden drops in BP    2  Hx PPM  Followed by [name]   3  Blood pressure  Labile  I would follow be fore treating   Need sustained HTN  4  HL  Check lipids  Resaonable to consider statin with atherosclerosis   For questions or updates, please contact [facility] Please consult [email or web address] for contact info under Cardiology/STEMI.      Signed, Dietrich Pates, MD  09/03/2017, 6:51 AM

## 2017-09-03 NOTE — Progress Notes (Signed)
Internal Medicine Attending:   I saw and examined the patient. I reviewed the resident's note and I agree with the resident's findings and plan as documented in the resident's note.  Post epidural headache is now resolved after receiving only supportive care. The chest pain from yesterday is also resolved spontaneously, we ruled out ACS and dissection with imaging. Left arm looks good and warm, no ill effects from contrast infiltration seen. PT recommends disposition to SNF given her deconditioning and fragility, which we will arrange.

## 2017-09-03 NOTE — Progress Notes (Signed)
   Subjective:  Patient seen laying comfortably in bed this AM in no acute distress. She states that her headache has improved since arrival to the hospital with bedrest. She states that she had some chest pain overnight but nothing as severe as yesterday afternoon. She also states that her L arm feels much better today.   Objective:  Vital signs in last 24 hours: Vitals:   09/02/17 2025 09/02/17 2324 09/03/17 0451 09/03/17 0801  BP: (!) 148/78 (!) 159/72 (!) 165/62 (!) 183/65  Pulse: 69 68 68 62  Resp: 18 19 19  (!) 22  Temp: (!) 97.3 F (36.3 C) (!) 97.5 F (36.4 C) 98.1 F (36.7 C) 97.9 F (36.6 C)  TempSrc: Oral Oral Oral Oral  SpO2: 97% 100% 93% 97%  Weight:   140 lb 3.4 oz (63.6 kg)   Height:       Physical Exam  Constitutional:  Thin, elderly woman laying comfortably in bed in no acute distress. Non diaphoretic and talkative, interacting appropriately with interviewers.  Cardiovascular: Normal rate and regular rhythm.  Exam reveals no friction rub.   No murmur heard. Pulmonary/Chest: Effort normal. No respiratory distress. She has no wheezes. She has no rales.  Abdominal: Soft. She exhibits no distension. There is no tenderness. There is no guarding.  Musculoskeletal: She exhibits no edema (of bilateral lower extremities) or tenderness (of bilateral lower extremities).  Neurological:  4/5 strength in bilateral lower extremities. Sensation to light touch intact on lower extremities bilaterally.   Skin: Skin is warm and dry. No rash noted. No erythema.   Assessment/Plan:  Active Problems:   Post-dural puncture headache  [name] [name] is a 83 yo with a PMH of NICM, CHB s/p PPM (05/2015), HTN, HLD, COPD, CVA, prior DVT who presented with a chief complaint of headches that started 2-3 days after an epidural injection for treatment of chronic back pain. She was admitted to the internal medicine teaching service for management. The specific problems addressed during admission were  as follows:  Acute onset chest pain, resolved: The pain has now resolved without intervention. Overnight troponin levels undetectable. Cardiology consult suggested that the patient's chest pain may not be cardiac in nature and recommended outpatient follow up with primary cardiologist after hospitalization.   Post-dural puncture headache: The patient's headache has improved with laying flat, consistent with the diagnosis of post-dural puncture headache. Patient has not required PRN medication overnight and will be encouraged to continue laying flat for treatment of her headache upon discharge.   HTN: Patient's BP 150s-160s/60-70's overnight. Amlodipine 2.5 mg daily was restarted on admission and held yesterday 2/2 hypotension. Patient can resume this medication for   Diet: HH VTE prophylaxis: SCD's while in bed Code Status: DNR  Dispo: Anticipated discharge in approximately today.  Rozann Lesches, MD 09/03/2017, 12:08 PM Pager: [phone]

## 2017-09-03 NOTE — Evaluation (Signed)
Physical Therapy Evaluation Patient Details Name: [name] [name] MRN: [id number] DOB: 11-19-1923 Today's Date: 09/03/2017   History of Present Illness  Patient is a 83 y/o female who presents with HA that started 2-3 days after an epidural injection for treatment of chronic back pain. Found to have Post-dural puncture headache and acute CP during admission. PMH includes NICM, CHB s/p PPM (05/2015), HTN, HLD, COPD, CVA, prior DVT, pulmonary fibrosis, COPD.  Clinical Impression  Patient presents with generalized weakness (LLE>RLE), dizziness, impaired standing balance and impaired mobility s/p above. Tolerated standing x2 and attempted gait training but pt unable to tolerate due to feeling swimmy headed. BP stable. Pt reports this is the first time pt has been sitting EOB since admission. Pt Mod I PTA and going to OPPT 3 times/week, doing really well. Lives with spouse who has PD. Pt high fall risk and not safe to return home at this time. Would benefit from SNF to maximize independence and mobility prior to return home. Will follow acutely.    Follow Up Recommendations SNF;Supervision for mobility/OOB    Equipment Recommendations  None recommended by PT    Recommendations for Other Services       Precautions / Restrictions Precautions Precautions: Fall Precaution Comments: dizzy Restrictions Weight Bearing Restrictions: No      Mobility  Bed Mobility Overal bed mobility: Needs Assistance Bed Mobility: Supine to Sit;Sit to Supine     Supine to sit: Min guard;HOB elevated Sit to supine: Min assist;HOB elevated   General bed mobility comments: Able to get to EOB with increased time, use of rail. + dizziness. Needs assist to bring LLE into bed and to readjust in bed.   Transfers Overall transfer level: Needs assistance Equipment used: Rolling walker (2 wheeled) Transfers: Sit to/from Stand Sit to Stand: Mod assist         General transfer comment: Assist to power to standing  with cues for technique. Stood from [employer] Energy[employer]OB x2, reports being "swimmy headed" both times and visibly swaying.   Ambulation/Gait Ambulation/Gait assistance: Mod assist Ambulation Distance (Feet): 2 Feet Assistive device: Rolling walker (2 wheeled) Gait Pattern/deviations: Step-to pattern Gait velocity: decreased   General Gait Details: Attempted to take a few steps forward but having a hard time advancing RLE and requires Mod A for support due to dizziness and poor balance. Stepped forward 2 steps and backward and needed to sit. BP stable.  Stairs            Wheelchair Mobility    Modified Rankin (Stroke Patients Only)       Balance Overall balance assessment: Needs assistance Sitting-balance support: Feet supported;No upper extremity supported Sitting balance-Leahy Scale: Fair Sitting balance - Comments: supervision for safety.    Standing balance support: During functional activity;Bilateral upper extremity supported Standing balance-Leahy Scale: Poor Standing balance comment: Reilant on UEs for support in standing and Min-Mod A for static/dynamic standing msotly due to weakness and dizziness.                              Pertinent Vitals/Pain Pain Assessment: 0-10 Pain Score: 2  Pain Location: right thigh Pain Descriptors / Indicators: Sore;Aching Pain Intervention(s): Monitored during session;Repositioned;Limited activity within patient's tolerance    Home Living Family/patient expects to be discharged to:: Private residence Living Arrangements: Spouse/significant other Available Help at Discharge: Family;Available 24 hours/day Type of Home: House Home Access: Level entry     Home Layout: Two level;Able  to live on main level with bedroom/bathroom Home Equipment: Dan Humphreys - 2 wheels;Walker - 4 wheels;Bedside commode;Cane - single point;Shower seat - built in Additional Comments: 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    Prior Function Level of  Independence: Independent with assistive device(s)         Comments: independent for household ambulation; rollator for community distances. Cooks, cleans. Does OPPT 3 times per week and goes to the gym everyday     Hand Dominance   Dominant Hand: Right    Extremity/Trunk Assessment   Upper Extremity Assessment Upper Extremity Assessment: Defer to OT evaluation    Lower Extremity Assessment Lower Extremity Assessment: LLE deficits/detail LLE Deficits / Details: Grossly ~2+-3/5 knee extension, 2+/5 ankle DF, 3/5 hip flexion. LLE Sensation: decreased light touch LLE Coordination: decreased gross motor;decreased fine motor       Communication   Communication: No difficulties  Cognition Arousal/Alertness: Awake/alert Behavior During Therapy: WFL for tasks assessed/performed Overall Cognitive Status: Within Functional Limits for tasks assessed                                        General Comments General comments (skin integrity, edema, etc.): Supine BP 120/83, sitting BP 118/77, sitting BP post standing 130/101. Very dizzy.    Exercises     Assessment/Plan    PT Assessment Patient needs continued PT services  PT Problem List Decreased strength;Decreased mobility;Decreased balance;Decreased activity tolerance;Cardiopulmonary status limiting activity;Decreased coordination;Decreased range of motion;Impaired sensation;Pain       PT Treatment Interventions Therapeutic activities;Gait training;Therapeutic exercise;Patient/family education;Balance training;Functional mobility training;DME instruction    PT Goals (Current goals can be found in the Care Plan section)  Acute Rehab PT Goals Patient Stated Goal: to get stronger and get back to PLOF PT Goal Formulation: With patient Time For Goal Achievement: 09/17/17 Potential to Achieve Goals: Fair    Frequency Min 3X/week   Barriers to discharge Decreased caregiver support      Co-evaluation                AM-PAC PT "6 Clicks" Daily Activity  Outcome Measure Difficulty turning over in bed (including adjusting bedclothes, sheets and blankets)?: None Difficulty moving from lying on back to sitting on the side of the bed? : Unable Difficulty sitting down on and standing up from a chair with arms (e.g., wheelchair, bedside commode, etc,.)?: Unable Help needed moving to and from a bed to chair (including a wheelchair)?: A Lot Help needed walking in hospital room?: A Lot Help needed climbing 3-5 steps with a railing? : Total 6 Click Score: 11    End of Session Equipment Utilized During Treatment: Gait belt Activity Tolerance: Treatment limited secondary to medical complications (Comment) (dizziness) Patient left: in bed;with call bell/phone within reach;with bed alarm set Nurse Communication: Mobility status PT Visit Diagnosis: Unsteadiness on feet (R26.81);Other abnormalities of gait and mobility (R26.89);Dizziness and giddiness (R42)    Time: 6045-4098 PT Time Calculation (min) (ACUTE ONLY): 30 min   Charges:   PT Evaluation $PT Eval Moderate Complexity: 1 Mod PT Treatments $Therapeutic Activity: 8-22 mins   PT G Codes:   PT G-Codes **NOT FOR INPATIENT CLASS** Functional Assessment Tool Used: Clinical judgement Functional Limitation: Mobility: Walking and moving around Mobility: Walking and Moving Around Current Status (J1914): At least 40 percent but less than 60 percent impaired, limited or restricted Mobility: Walking and Moving Around Goal  Status [phone]): At least 20 percent but less than 40 percent impaired, limited or restricted    [place], [place], [place] [phone]    Marcy Panning 09/03/2017, 2:35 PM

## 2017-09-04 DIAGNOSIS — I428 Other cardiomyopathies: Secondary | ICD-10-CM | POA: Diagnosis present

## 2017-09-04 DIAGNOSIS — G8929 Other chronic pain: Secondary | ICD-10-CM | POA: Diagnosis not present

## 2017-09-04 DIAGNOSIS — Z90710 Acquired absence of both cervix and uterus: Secondary | ICD-10-CM | POA: Diagnosis not present

## 2017-09-04 DIAGNOSIS — Y844 Aspiration of fluid as the cause of abnormal reaction of the patient, or of later complication, without mention of misadventure at the time of the procedure: Secondary | ICD-10-CM | POA: Diagnosis present

## 2017-09-04 DIAGNOSIS — Z888 Allergy status to other drugs, medicaments and biological substances status: Secondary | ICD-10-CM | POA: Diagnosis not present

## 2017-09-04 DIAGNOSIS — R531 Weakness: Secondary | ICD-10-CM | POA: Diagnosis not present

## 2017-09-04 DIAGNOSIS — I2510 Atherosclerotic heart disease of native coronary artery without angina pectoris: Secondary | ICD-10-CM | POA: Diagnosis present

## 2017-09-04 DIAGNOSIS — M5416 Radiculopathy, lumbar region: Secondary | ICD-10-CM | POA: Diagnosis present

## 2017-09-04 DIAGNOSIS — Z886 Allergy status to analgesic agent status: Secondary | ICD-10-CM | POA: Diagnosis not present

## 2017-09-04 DIAGNOSIS — G4440 Drug-induced headache, not elsewhere classified, not intractable: Secondary | ICD-10-CM | POA: Diagnosis not present

## 2017-09-04 DIAGNOSIS — Z79899 Other long term (current) drug therapy: Secondary | ICD-10-CM | POA: Diagnosis not present

## 2017-09-04 DIAGNOSIS — I252 Old myocardial infarction: Secondary | ICD-10-CM | POA: Diagnosis not present

## 2017-09-04 DIAGNOSIS — R402362 Coma scale, best motor response, obeys commands, at arrival to emergency department: Secondary | ICD-10-CM | POA: Diagnosis present

## 2017-09-04 DIAGNOSIS — Z66 Do not resuscitate: Secondary | ICD-10-CM | POA: Diagnosis present

## 2017-09-04 DIAGNOSIS — Z8673 Personal history of transient ischemic attack (TIA), and cerebral infarction without residual deficits: Secondary | ICD-10-CM | POA: Diagnosis not present

## 2017-09-04 DIAGNOSIS — R402142 Coma scale, eyes open, spontaneous, at arrival to emergency department: Secondary | ICD-10-CM | POA: Diagnosis present

## 2017-09-04 DIAGNOSIS — Z7982 Long term (current) use of aspirin: Secondary | ICD-10-CM | POA: Diagnosis not present

## 2017-09-04 DIAGNOSIS — Z96653 Presence of artificial knee joint, bilateral: Secondary | ICD-10-CM | POA: Diagnosis present

## 2017-09-04 DIAGNOSIS — J449 Chronic obstructive pulmonary disease, unspecified: Secondary | ICD-10-CM | POA: Diagnosis present

## 2017-09-04 DIAGNOSIS — R402252 Coma scale, best verbal response, oriented, at arrival to emergency department: Secondary | ICD-10-CM | POA: Diagnosis present

## 2017-09-04 DIAGNOSIS — Z885 Allergy status to narcotic agent status: Secondary | ICD-10-CM | POA: Diagnosis not present

## 2017-09-04 DIAGNOSIS — Z86718 Personal history of other venous thrombosis and embolism: Secondary | ICD-10-CM | POA: Diagnosis not present

## 2017-09-04 DIAGNOSIS — F329 Major depressive disorder, single episode, unspecified: Secondary | ICD-10-CM | POA: Diagnosis present

## 2017-09-04 DIAGNOSIS — Z8249 Family history of ischemic heart disease and other diseases of the circulatory system: Secondary | ICD-10-CM | POA: Diagnosis not present

## 2017-09-04 DIAGNOSIS — Z950 Presence of cardiac pacemaker: Secondary | ICD-10-CM | POA: Diagnosis not present

## 2017-09-04 DIAGNOSIS — I10 Essential (primary) hypertension: Secondary | ICD-10-CM | POA: Diagnosis present

## 2017-09-04 DIAGNOSIS — T8859XA Other complications of anesthesia, initial encounter: Secondary | ICD-10-CM | POA: Diagnosis not present

## 2017-09-04 DIAGNOSIS — M545 Low back pain: Secondary | ICD-10-CM | POA: Diagnosis not present

## 2017-09-04 DIAGNOSIS — I951 Orthostatic hypotension: Secondary | ICD-10-CM | POA: Diagnosis not present

## 2017-09-04 DIAGNOSIS — G971 Other reaction to spinal and lumbar puncture: Secondary | ICD-10-CM | POA: Diagnosis present

## 2017-09-04 DIAGNOSIS — E785 Hyperlipidemia, unspecified: Secondary | ICD-10-CM | POA: Diagnosis not present

## 2017-09-04 LAB — BASIC METABOLIC PANEL
ANION GAP: 10 (ref 5–15)
BUN: 25 mg/dL — AB (ref 6–20)
CHLORIDE: 103 mmol/L (ref 101–111)
CO2: 24 mmol/L (ref 22–32)
Calcium: 9.2 mg/dL (ref 8.9–10.3)
Creatinine, Ser: 1.03 mg/dL — ABNORMAL HIGH (ref 0.44–1.00)
GFR calc Af Amer: 53 mL/min — ABNORMAL LOW (ref >60)
GFR, EST NON AFRICAN AMERICAN: 46 mL/min — AB (ref >60)
Glucose, Bld: 92 mg/dL (ref 65–99)
POTASSIUM: 4.2 mmol/L (ref 3.5–5.1)
SODIUM: 137 mmol/L (ref 135–145)

## 2017-09-04 MED ORDER — AMLODIPINE BESYLATE 2.5 MG PO TABS: 2.5000 mg | ORAL_TABLET | Freq: Every day | ORAL | 0 refills | Status: DC

## 2017-09-04 MED ORDER — BISACODYL 5 MG PO TBEC
5.0000 mg | DELAYED_RELEASE_TABLET | Freq: Every day | ORAL | Status: DC | PRN
  Administered 2017-09-04: 5 mg via ORAL
  Filled 2017-09-04 (×2): qty 1

## 2017-09-04 NOTE — Clinical Social Work Note (Signed)
Clinical Social Work Assessment  Patient Details  Name: [name] [name] MRN: [id number] Date of Birth: June 14, 1924  Date of referral:  09/04/17               Reason for consult:  Facility Placement, Discharge Planning                Permission sought to share information with:  Family Supports, Customer service manager Permission granted to share information::  Yes, Verbal Permission Granted  Name::     [place]::  SNFs  Relationship::  Daughter  Contact Information:  [phone] / [phone]  Housing/Transportation Living arrangements for the past 2 months:  Single Family Home Source of Information:  Patient, Adult Children, Spouse Patient Interpreter Needed:  None Criminal Activity/Legal Involvement Pertinent to Current Situation/Hospitalization:  No - Comment as needed Significant Relationships:  Adult Children Lives with:  Spouse Do you feel safe going back to the place where you live?  Yes Need for family participation in patient care:  Yes (Comment)  Care giving concerns:  No care giving concerns identified.    Social Worker assessment / plan:  CSW met with pt and family at bedside to address consult for new SNF. CSW introduced herself and explained role of social work. Pt from home and lives with husband. P/T is recommending STR at SNF. CSW explained discharging to SNF with a [facility] Medicare. Auth cannot be gotten over the weekend and CSW explained to pt and family. Pt agreeable to SNF for STR. Pt prefers [facility] but agreed for CSW to fax out to [facility].    CSW sent FL-2 to SNFs. [facility] does not have availability, [facility] do not accept [facility], [facility] declined. Case staffed with [facility]. CSW will continue to follow.   Employment status:  Retired Glass blower/designer) PT Recommendations:  [place] / Referral to community  resources:  [place]  Patient/Family's Response to care:  Family supportive of pt needs and are agreeable to plan of care.   Patient/Family's Understanding of and Emotional Response to Diagnosis, Current Treatment, and Prognosis:  Pt understands she will benefit from STR prior to returning home and is accepting of her diagnosis, current treatment plan, and prognosis.    Emotional Assessment Appearance:  Appears stated age Attitude/Demeanor/Rapport:  Other (Appropriate) Affect (typically observed):  Accepting, Adaptable Orientation:  Oriented to Self, Oriented to Place, Oriented to  Time, Oriented to Situation Alcohol / Substance use:  Other Psych involvement (Current and /or in the community):  No (Comment)  Discharge Needs  Concerns to be addressed:  Care Coordination, Discharge Planning Concerns Readmission within the last 30 days:  No Current discharge risk:  Dependent with Mobility Barriers to Discharge:  Continued Medical Work up   Truitt Merle, LCSW 09/04/2017, 4:59 PM

## 2017-09-04 NOTE — Discharge Summary (Signed)
Name: EMRY TOBIN MRN: [id number] DOB: 11/16/1923 83 y.o. PCP: Olive Bass, MD  Date of Admission: 09/01/2017  5:27 AM Date of Discharge: 09/07/2017 Attending Physician: Tyson Alias, *  Discharge Diagnosis:  Active Problems:   Post-dural puncture headache  Discharge Medications: Allergies as of 09/07/2017      Reactions   Crestor [rosuvastatin] Other (See Comments)   Myalgia   Darvon Other (See Comments)   Drop in  BP   Meloxicam Other (See Comments)   Dizziness    Meperidine Other (See Comments)   Hypotension   Prednisone Diarrhea   POLYURIA   Valsartan Other (See Comments)    Hypotension   Amiodarone Other (See Comments)   Did not tolerate - unknown reaction   Aspirin-acetaminophen-caffeine Other (See Comments)   Unknown reaction   Nitroglycerin    Profound hypotension with sublingual nitroglycerin.    Tramadol Other (See Comments)   Unknown reaction      Medication List    STOP taking these medications   traMADol 50 MG tablet Commonly known as:  ULTRAM     TAKE these medications   amLODipine 2.5 MG tablet Commonly known as:  NORVASC Take 1 tablet (2.5 mg total) by mouth daily.   aspirin EC 81 MG tablet Take 1 tablet (81 mg total) by mouth daily.   DULoxetine 30 MG capsule Commonly known as:  CYMBALTA Take 30 mg by mouth daily.   FLUTTER Devi See admin instructions. Exercise lungs three to four (3-4) times a day as needed for flutter   vitamin B-12 1000 MCG tablet Commonly known as:  CYANOCOBALAMIN Take 1,000 mcg by mouth daily.            Durable Medical Equipment        Start     Ordered   09/07/17 1652  For home use only DME 4 wheeled rolling walker with seat  (Walkers)  Once    Question:  Patient needs a walker to treat with the following condition  Answer:  Chronic back pain   09/07/17 1652   09/07/17 1638  For home use only DME 4 wheeled rolling walker with seat  (Walkers)  Once    Question:  Patient needs a  walker to treat with the following condition  Answer:  Chronic back pain greater than 3 months duration   09/07/17 1638      Disposition and follow-up:   Ms.Dailin C Durfee was discharged from [facility] in good condition.  At the hospital follow up visit please address:  1.  Patient presented with headache on admission most likely due to epidural steroid injection 3 days prior to admission. Patient had a lumbar spine blood patch placed with IR on HD #3 to help improve her symptoms. Her headache was well controlled with laying flat and with this procedure. Please follow up how she is doing.   Patient presented hypertensive, with her systolic BP ranging from 180s -160s. She was started on amlodipine 2.5 mg during admission. This medication was well tolerated and she was normotensive on admission. Please see how she is doing on this medication.   Patient had some episodes of orthostatic hypotension prior to discharge. Patient is predisposed to having these episodes as she is AV paced and there is likely underlying autonomic dysfunction given her age. She was given fluids to improve BP prior to discharge. Would continue to encourage fluid intake and use of compression stockings while at rehabilitation facility.  2.  Labs / imaging needed at time of follow-up: none  3.  Pending labs/ test needing follow-up: none  Follow-up Appointments:  Contact information for follow-up providers    Dough, Doris Cheadle, MD. Schedule an appointment as soon as possible for a visit in 1 week(s).   Specialty:  Family Medicine Why:  To reveiw your hospitalization and rehabilitation.  Contact information: [address] [place] [address] [phone]        [facility] Follow up.   Specialty:  Home Health Services Why:  Registered Nurse, Physical / Occupational Therapies, Aide, Child psychotherapist.  Contact information: [address] [place] [address] [phone]             Contact information for after-discharge care    Destination    [facility] Follow up.   Specialty:  Skilled Nursing Facility Contact information: [address] [place] [address] [phone]                  Hospital Course by problem list: Active Problems:   Post-dural puncture headache   Post-dural puncture headache: Patient presented with a headache status post epidural steroid injection 3 days prior. She states her headache was worse when standing and improved when laying down. CT head was reassuring for no acute bleed.  Patient's symptoms improved with laying flat at the beginning of hospitalization, but her symptoms continued to cause discomfort even with PRN medications. On HD #3 the patient underwent a blood patch placement in her lower lumbar spine with interventional radiology to help improve her persistent headache. She tolerated this procedure well and had even more improvement in her symptoms as a result of this procedure. On the day of discharge patient stated that her headaches were much improved. Due to laying in bed for several days patient became deconditioned and PT/OT assessments recommended SNF for rehabilitation. SW was consulted and after discussions with family regarding difficulty with insurance acceptance at local facilities, the decision was made to discharge to home with Home Health Services. These were ordered upon discharge.    Orthostatic hypotension: Prior to discharge the patient had some symptomatic hypotension, which was expressed as overall weakness and vague, pressure in her head, when working with PT. She responded to fluid replacement. Please encourage patient to maintain fluid status, as she ate and drank very little while inpatient, and to wear compression stockings while at rehabilitation facility.  If episodes of hypotension continue would recommend discontinuation of amlodipine (see below).    Hypertension:  During admission patient's blood pressure was elevated to systolics 180s-160s. She was started on amlodipine 2.5 mg, which was well tolerated during admission. It was recommended that she follow-up with primary care physician to reevaluate if this medication needs to be titrated.   Chest pain: During admission patient developed chest pain and was treated with nitroglycerin. After nitroglycerin was administered patient became hypotensive and received fluids with resolution of hypotension. EKG was hard to interpret because of V pacing and wide QRS. She described an acute pain radiating to her shoulder blades and a CT chest, abdomen, and pelvis was done to rule out acute dissection. During the CT scan, the patient had extravasation of contrast in her left arm, but no negative effects from this complication. Her noncontrasted CT scan did not show any signs of dissection and the patient remained hemodynamically stable after fluid administration. Serial troponin levels were negative.  Patient's chest pain resolved overnight. Cardiology was  consulted and did not think her chest pain was cardiac related. On day of discharge patient did not have chest pain and left arm appeared normal with no pain or changes.  Discharge Vitals:   BP 139/69   Pulse 73   Temp 97.7 F (36.5 C) (Oral)   Resp 18   Ht 5\' 6"  (1.676 m)   Wt 136 lb 4.8 oz (61.8 kg)   SpO2 96%   BMI 22.00 kg/m   Pertinent Labs, Studies, and Procedures:   CBC CBC Latest Ref Rng & Units 09/02/2017 09/01/2017 06/27/2017  WBC 4.0 - 10.5 K/uL 6.7 5.6 6.2  Hemoglobin 12.0 - 15.0 g/dL 16.113.5 09.614.1 04.514.9  Hematocrit 36.0 - 46.0 % 41.1 42.9 43.9  Platelets 150 - 400 K/uL 131(L) 145(L) 177   BMP BMP Latest Ref Rng & Units 09/07/2017 09/06/2017 09/05/2017  Glucose 65 - 99 mg/dL 409(W108(H) 119(J115(H) 478(G104(H)  BUN 6 - 20 mg/dL 95(A29(H) 21(H32(H) 08(M22(H)  Creatinine 0.44 - 1.00 mg/dL 5.78(I1.01(H) 6.96(E1.06(H) 9.52(W1.02(H)  Sodium 135 - 145 mmol/L 136 136 136  Potassium  3.5 - 5.1 mmol/L 4.3 4.1 3.9  Chloride 101 - 111 mmol/L 104 103 103  CO2 22 - 32 mmol/L 26 26 25   Calcium 8.9 - 10.3 mg/dL 9.2 8.9 9.0   CT Head: IMPRESSION: No change. Chronic small-vessel ischemic changes. Old left occipital infarction. Generalized atrophy. Bilateral subdural hygromas. No finding seen to explain the acute headache or as a complication of recent spinal injection.  CT Chest, Abdomen, and Pelvis: IMPRESSION: 1. Unsuccessful CTA of the chest, abdomen pelvis due to the contrast extravasation and inability to subsequently establish intravenous access for the CTA. 2. No aortic aneurysm or displaced intimal calcifications. 3. Decreased size of the previously demonstrated small right upper lobe nodule and no gross change in a small left lower lobe nodule, partially obscured by dependent atelectasis today and stable several small right lower lobe nodules. 4. Stable mild tree-in-bud opacities in the right lower lobe. This could be due to bronchiolitis. 5. Stable bronchiectasis in the right middle lobe and lingula. 6. Moderate diffuse pancreatic atrophy. 7. Small hiatal hernia. 8. Calcified coronary artery and aortic atherosclerosis. Aortic Atherosclerosis (ICD10-I70.0).  IR FL guided Epidural Injection: DIAGNOSTIC EPIDURAL INJECTION Injection of Isovue-M 200 shows a good epidural pattern with spread above and below the level of needle placement, primarily on the side of needle placement. No vascular or subarachnoid opacification is seen.  THERAPEUTIC EPIDURAL INJECTION 15 ml of the patient's blood was injected into the epidural space at the site of prior lumbar puncture.  IMPRESSION: Technically successful lumbar blood patch at L3-4, LEFT.  Discharge Instructions: Discharge Instructions    Call MD for:  difficulty breathing, headache or visual disturbances    Complete by:  As directed    Call MD for:  difficulty breathing, headache or visual disturbances     Complete by:  As directed    Call MD for:  persistant dizziness or light-headedness    Complete by:  As directed    Call MD for:  redness, tenderness, or signs of infection (pain, swelling, redness, odor or green/yellow discharge around incision site)    Complete by:  As directed    Call MD for:  temperature >100.4    Complete by:  As directed    Call MD for:  temperature >100.4    Complete by:  As directed    Diet - low sodium heart healthy    Complete by:  As directed  Diet - low sodium heart healthy    Complete by:  As directed    Diet - low sodium heart healthy    Complete by:  As directed    Diet - low sodium heart healthy    Complete by:  As directed    Discharge instructions    Complete by:  As directed    Please start taking amlodipine 2.5 mg daily to help control your blood pressure. Please follow up with your primary care physician at discharge.   Discharge instructions    Complete by:  As directed    You were evaluated for headache. Your headache was likely a result of the steroid injection that you had prior to admission to control your chronic back pain. Your headache improved with laying flat and with a procedure used to ensure that you were not leaking spinal fluid as a result of the procedure you had for your back pain. Please follow up with your primary care physician after discharge to go over your hospitalization.   Discharge instructions    Complete by:  As directed    You were evaluated for headache. Your headache was likely a result of the steroid injection that you had prior to admission to control your chronic back pain. Your headache improved with laying flat and with a procedure used to ensure that you were not leaking spinal fluid as a result of the procedure you had for your back pain. Please follow up with your primary care physician after discharge to go over your hospitalization   Increase activity slowly    Complete by:  As directed    Increase activity  slowly    Complete by:  As directed    Increase activity slowly    Complete by:  As directed    Increase activity slowly    Complete by:  As directed       Signed: Rozann Lesches, MD 09/07/2017, 4:52 PM   Pager: [phone]

## 2017-09-04 NOTE — Progress Notes (Addendum)
   Subjective:  Patient was evaluated this morning on rounds. She reports improvement in her headaches during admission.  She denies any chest pain or left arm pain where her IV contrast extravasated 2 days prior.  Objective:  Vital signs in last 24 hours: Vitals:   09/03/17 2343 09/04/17 0422 09/04/17 0802 09/04/17 0900  BP: (!) 153/76 (!) 152/71 (!) 156/78 (!) 129/56  Pulse: 62 65 77   Resp: 18 16    Temp: 97.7 F (36.5 C) (!) 97.3 F (36.3 C) 98 F (36.7 C)   TempSrc: Oral Oral Oral   SpO2: 93% 94% 95%   Weight:      Height:       Physical Exam  Constitutional:  Thin, elderly woman laying comfortably in bed in no acute distress.   Cardiovascular: Normal rate and regular rhythm.  Exam reveals no friction rub.   No murmur heard. Pulmonary/Chest: Effort normal. No respiratory distress. She has no wheezes. She has no rales.  Abdominal: Soft. She exhibits no distension. There is no tenderness. There is no guarding.  Musculoskeletal: She exhibits no edema or tenderness.  Neurological:     Skin: Skin is warm and dry. No rash noted. No erythema.   Assessment/Plan:  Active Problems:   Post-dural puncture headache  [name] [name] is a 83 yo with a PMH of NICM, CHB s/p PPM (05/2015), HTN, HLD, COPD, CVA, prior DVT who presented with a chief complaint of headches that started 2-3 days after an epidural injection for treatment of chronic back pain. She was admitted to the internal medicine teaching service for management. The specific problems addressed during admission were as follows:  Post-dural puncture headache: The patient's headache has improved with laying flat, consistent with the diagnosis of post-dural puncture headache. Patient has not required PRN medication overnight and will be encouraged to continue laying flat for treatment of her headache upon discharge.   Acute onset chest pain, resolved: The pain has now resolved without intervention. Overnight troponin levels  undetectable. Cardiology consult suggested that the patient's chest pain may not be cardiac in nature and recommended outpatient follow up with primary cardiologist after hospitalization.  -outpatient follow up with cardiologist  HTN: Patient's BP 150s-160s/60-70's overnight. Amlodipine 2.5 mg daily was restarted on admission. -amlodipine 2.5mg      Addendum: Patient reported continued headache this afternoon and would like to go forward with blood patch. Discussed case with interventional radiologist and plan for blood patch tomorrow morning.  Diet: HH VTE prophylaxis: SCD's while in bed Code Status: DNR  Dispo: Anticipated discharge pending clinical improvement.  Geralyn Corwin [place], DO 09/04/2017, 11:56 AM Pager: [phone]

## 2017-09-04 NOTE — Clinical Social Work Placement (Signed)
   CLINICAL SOCIAL WORK PLACEMENT  NOTE  Date:  09/04/2017  Patient Details  Name: [name] [name] MRN: [id number] Date of Birth: [birth year]  Clinical Social Work is seeking post-discharge placement for this patient at the Skilled  Nursing Facility level of care (*CSW will initial, date and re-position this form in  chart as items are completed):  Yes   Patient/family provided with [facility] Clinical Social Work Department's list of facilities offering this level of care within the geographic area requested by the patient (or if unable, by the patient's family).  Yes   Patient/family informed of their freedom to choose among providers that offer the needed level of care, that participate in Medicare, Medicaid or managed care program needed by the patient, have an available bed and are willing to accept the patient.  Yes   Patient/family informed of [facility]'s ownership interest in [facility] and [facility], as well as of the fact that they are under no obligation to receive care at these facilities.  PASRR submitted to EDS on       PASRR number received on       Existing PASRR number confirmed on 09/04/17     FL2 transmitted to all facilities in geographic area requested by pt/family on 09/04/17     FL2 transmitted to all facilities within larger geographic area on       Patient informed that his/her managed care company has contracts with or will negotiate with certain facilities, including the following:            Patient/family informed of bed offers received.  Patient chooses bed at       Physician recommends and patient chooses bed at      Patient to be transferred to   on  .  Patient to be transferred to facility by       Patient family notified on   of transfer.  Name of family member notified:        PHYSICIAN Please sign FL2, Please prepare prescriptions, Please sign DNR, Please prepare priority discharge summary, including medications      Additional Comment:    _______________________________________________ Dominic Pea, LCSW 09/04/2017, 4:46 PM

## 2017-09-04 NOTE — Progress Notes (Signed)
Spoke with MD about DC planning. Patient is to be seen by IR for a blood patch since return of headache this AM. Blood patch to be done in AM then DC'd if effective for symptom reflief. Spoke w daughter, still persuing SNF w CSW, and if not available is agreeable to setting up PCS through someone they know and getting [facility] services as well to bridge the time between DC and admission to a SNF.

## 2017-09-04 NOTE — [place] FL2 (Signed)
[place] FL2 LEVEL OF CARE SCREENING TOOL     IDENTIFICATION  Patient Name: [name] [name] Birthdate: 01-Jul-1924 Sex: female Admission Date (Current Location): 09/01/2017  [facility] and [place] Number:  Herbalist and Address:  [facility], [place] [address], [place], [place] [address]      Provider Number: [id number]  Attending Physician Name and Address:  Axel Filler, *  Relative Name and Phone Number:       Current Level of Care: Hospital Recommended Level of Care: [place] Prior Approval Number:    Date Approved/Denied:   PASRR Number: [id number]  Discharge Plan: SNF    Current Diagnoses: Patient Active Problem List   Diagnosis Date Noted  . Post-dural puncture headache 09/01/2017  . Chest pain with low risk for cardiac etiology 11/18/2015  . Pacemaker   . Bradycardia with 31 - 40 beats per minute 05/14/2015  . Heart block   . Pain in limb 08/16/2013  . Varicose veins of lower extremities with other complications 17/51/0258  . Edema -lower extremity 06/22/2013  . Syncope 12/15/2012  . Osteoarthritis of right knee 12/12/2012  . History of pulmonary embolism 08/30/2012  . Fatigue 08/19/2012  . Wide-complex tachycardia ([place]) 05/30/2012  . Bronchiectasis, non-tuberculous ([place]) 01/06/2012  . 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 Diverticulosis   . High cholesterol   . Coronary artery disease prior MI   . Hypertension     Orientation RESPIRATION BLADDER Height & Weight     Self, Time, Situation, Place  Normal Continent Weight: 132 lb 15 oz (60.3 kg) Height:  _0  (167.6 cm)  BEHAVIORAL SYMPTOMS/MOOD NEUROLOGICAL BOWEL NUTRITION STATUS      Continent Diet (See discharge summary)  AMBULATORY STATUS COMMUNICATION OF NEEDS Skin   Extensive Assist Verbally Normal                       Personal Care Assistance Level of Assistance  Bathing, Feeding, Dressing  Bathing Assistance: Limited assistance Feeding assistance: Independent Dressing Assistance: Limited assistance     Functional Limitations Info  Sight, Hearing, Speech Sight Info: Adequate Hearing Info: Adequate Speech Info: Adequate    SPECIAL CARE FACTORS FREQUENCY  PT (By licensed PT)     PT Frequency: 5x              Contractures Contractures Info: Not present    Additional Factors Info  Code Status, Allergies Code Status Info: DNR Allergies Info: Crestor Rosuvastatin, Darvon, Meloxicam, Meperidine, Prednisone, Valsartan, Amiodarone, Aspirin-acetaminophen-caffeine, Nitroglycerin, Tramadol           Current Medications (09/04/2017):  This is the current hospital active medication list Current Facility-Administered Medications  Medication Dose Route Frequency Provider Last Rate Last Dose  . amLODipine (NORVASC) tablet 2.5 mg  2.5 mg Oral Daily Nedrud, Marybeth, MD   2.5 mg at 09/04/17 1019  . aspirin EC tablet 81 mg  81 mg Oral Daily Shela Leff, MD   81 mg at 09/04/17 1013  . DULoxetine (CYMBALTA) DR capsule 30 mg  30 mg Oral Daily Shela Leff, MD   30 mg at 09/04/17 1021  . feeding supplement (ENSURE ENLIVE) (ENSURE ENLIVE) liquid 237 mL  237 mL Oral BID BM Axel Filler, MD      . Influenza vac split quadrivalent PF (FLUZONE HIGH-DOSE) injection 0.5 mL  0.5 mL Intramuscular Prior to discharge Axel Filler, MD      . ketorolac (TORADOL)  15 MG/ML injection 15 mg  15 mg Intravenous Q6H PRN Shela Leff, MD   15 mg at 09/02/17 1445  . ondansetron (ZOFRAN) injection 4 mg  4 mg Intravenous Q6H PRN Nedrud, Larena Glassman, MD   4 mg at 09/04/17 1015  . vitamin B-12 (CYANOCOBALAMIN) tablet 1,000 mcg  1,000 mcg Oral Daily Shela Leff, MD   1,000 mcg at 09/04/17 1023     Discharge Medications: Please see discharge summary for a list of discharge medications.  Relevant Imaging Results:  Relevant Lab Results:   Additional  Information SSN: [id number]  Truitt Merle, LCSW

## 2017-09-05 LAB — BASIC METABOLIC PANEL
ANION GAP: 8 (ref 5–15)
BUN: 22 mg/dL — AB (ref 6–20)
CO2: 25 mmol/L (ref 22–32)
Calcium: 9 mg/dL (ref 8.9–10.3)
Chloride: 103 mmol/L (ref 101–111)
Creatinine, Ser: 1.02 mg/dL — ABNORMAL HIGH (ref 0.44–1.00)
GFR, EST AFRICAN AMERICAN: 54 mL/min — AB (ref >60)
GFR, EST NON AFRICAN AMERICAN: 47 mL/min — AB (ref >60)
Glucose, Bld: 104 mg/dL — ABNORMAL HIGH (ref 65–99)
POTASSIUM: 3.9 mmol/L (ref 3.5–5.1)
SODIUM: 136 mmol/L (ref 135–145)

## 2017-09-05 NOTE — Clinical Social Work Note (Addendum)
CSW spoke with [place] who works at the main office for [employer]. She stated that patient has a bed at [facility] pending review of clinicals. [facility] staff will call CSW after reviewing referral and determine if they can take her or not.  Charlynn Court, CSW [phone]  11:41 am CSW spoke with Maralyn Sago at [place] main office. She requested that clinicals be faxed to her for review. They will have to get authorization before she can admit but will start it today. They are hopeful to get same-day authorization. If patient decides to go as private pay until they get auth, she would have to pay 30 days up front (around $14,000).  Charlynn Court, CSW [phone]  12:06 pm CSW confirmed that Maralyn Sago at [place] received the clinicals. CSW updated patient and family at bedside.  Charlynn Court, CSW [phone]  1:49 pm Jennavecia Schwier with [facility] called to confirm that she reviewed the clinicals and patient has a bed available once insurance authorization is obtained. She has faxed the clinicals to [facility] and will update CSW once authorization decision has been determined.  Charlynn Court, CSW [phone]

## 2017-09-05 NOTE — Progress Notes (Signed)
I was following up with MD on blood patch placement and he stated it was to be done by IR today. I then spoke with Dr. Rica Records and he didn't have the staff to complete this . He stated there was not an order in Epic and once placed, he could proceed with it tomorrow. I informed Dr. Crista Elliot, order received, and patient and family made aware.

## 2017-09-05 NOTE — Progress Notes (Signed)
   Subjective:  Patient was lying in bed watching TV today upon entering the room. She stated that she slept well overnight, had a mild HA that was worse with sitting up but otherwise denied any specific complaints including, palpitations, chest pain, abdominal pain, muscle aches, fever, chills, cough or swelling in her feet.   Objective:  Vital signs in last 24 hours: Vitals:   09/04/17 0422 09/04/17 0802 09/04/17 0900 09/05/17 0643  BP: (!) 152/71 (!) 156/78 (!) 129/56 (!) 156/68  Pulse: 65 77  65  Resp: 16   14  Temp: (!) 97.3 F (36.3 C) 98 F (36.7 C)  98.2 F (36.8 C)  TempSrc: Oral Oral  Oral  SpO2: 94% 95%  92%  Weight:      Height:       ROS negative except as per HPI.  Physical Exam  Constitutional:  Thin, elderly woman laying comfortably in bed in no acute distress.   Cardiovascular: Normal rate and regular rhythm.  Exam reveals no friction rub.   No murmur heard. Pulmonary/Chest: Effort normal. No respiratory distress. She has no wheezes. She has no rales.  Abdominal: Soft. She exhibits no distension. There is no tenderness. There is no guarding.  Musculoskeletal: She exhibits no edema or tenderness.  Neurological:     Skin: Skin is warm and dry. No rash noted. No erythema.  Nursing note and vitals reviewed.  Assessment/Plan:  Active Problems:   Post-dural puncture headache  [name] [name] is a 83 yo with a PMH of NICM, CHB s/p PPM (05/2015), HTN, HLD, COPD, CVA, prior DVT who presented with a chief complaint of headches that started 2-3 days after an epidural injection for treatment of chronic back pain. She was admitted to the internal medicine teaching service for management. The specific problems addressed during admission were as follows:  Post-dural puncture headache: The patient's headache has improved with laying flat, consistent with the diagnosis of post-dural puncture headache. Patient has not required PRN medication overnight and will be encouraged to  continue laying flat for treatment of her headache upon discharge.  -Patient reported continued headache yesterday afternoon and would like to go forward with blood patch. The case was discussed with interventional radiologist who planned for blood patch this am. -Patient's HA remained, she stated that she was slated for a procedure at 10:00am this morning -Will await results of this procedure.  Acute onset chest pain, resolved: The pain has now resolved without intervention. Overnight troponin levels undetectable. Cardiology consult suggested that the patient's chest pain may not be cardiac in nature and recommended outpatient follow up with primary cardiologist after hospitalization.  -outpatient follow up with cardiologist  HTN: Patient's BP 150s-160s/60-70's overnight. Amlodipine 2.5 mg daily was restarted on admission. -amlodipine 2.5mg      Diet: HH VTE prophylaxis: SCD's while in bed Code Status: DNR Diet: Heart Healthy Dispo: Anticipated discharge pending clinical improvement.  Lanelle Bal, MD 09/05/2017, 7:06 AM Pager: [phone]

## 2017-09-05 NOTE — Care Management Note (Signed)
Case Management Note  Patient Details  Name: NIESHA SANDI MRN: [id number] Date of Birth: 1924/04/11  Subjective/Objective:  CM received consult for discussion of IP vs Obs status. Family stated they were really more concerned with which at  facility they would be accepted. CM shared this with CSW.                   Action/Plan:CM will sign off for now but will be available should additional discharge needs arise or disposition change.    Expected Discharge Date:  09/04/17               Expected Discharge Plan:     In-House Referral:     Discharge planning Services  CM Consult  Post Acute Care Choice:    Choice offered to:     DME Arranged:    DME Agency:     HH Arranged:    HH Agency:     Status of Service:  Completed, signed off  If discussed at [employer] of Stay Meetings, dates discussed:    Additional Comments:  Yvone Neu, RN 09/05/2017, 11:40 AM

## 2017-09-06 ENCOUNTER — Inpatient Hospital Stay (HOSPITAL_COMMUNITY): Payer: Medicare HMO

## 2017-09-06 ENCOUNTER — Encounter (HOSPITAL_COMMUNITY): Payer: Self-pay | Admitting: Radiology

## 2017-09-06 DIAGNOSIS — G4440 Drug-induced headache, not elsewhere classified, not intractable: Secondary | ICD-10-CM

## 2017-09-06 DIAGNOSIS — R531 Weakness: Secondary | ICD-10-CM

## 2017-09-06 DIAGNOSIS — M545 Low back pain: Secondary | ICD-10-CM

## 2017-09-06 DIAGNOSIS — T8859XA Other complications of anesthesia, initial encounter: Secondary | ICD-10-CM

## 2017-09-06 DIAGNOSIS — G8929 Other chronic pain: Secondary | ICD-10-CM

## 2017-09-06 HISTORY — PX: IR FL GUIDED LOC OF NEEDLE/CATH TIP FOR SPINAL INJECTION LT: IMG2396

## 2017-09-06 LAB — BASIC METABOLIC PANEL
Anion gap: 7 (ref 5–15)
BUN: 32 mg/dL — AB (ref 6–20)
CHLORIDE: 103 mmol/L (ref 101–111)
CO2: 26 mmol/L (ref 22–32)
Calcium: 8.9 mg/dL (ref 8.9–10.3)
Creatinine, Ser: 1.06 mg/dL — ABNORMAL HIGH (ref 0.44–1.00)
GFR calc Af Amer: 52 mL/min — ABNORMAL LOW (ref >60)
GFR calc non Af Amer: 44 mL/min — ABNORMAL LOW (ref >60)
GLUCOSE: 115 mg/dL — AB (ref 65–99)
Potassium: 4.1 mmol/L (ref 3.5–5.1)
Sodium: 136 mmol/L (ref 135–145)

## 2017-09-06 MED ORDER — LIDOCAINE HCL 1 % IJ SOLN
INTRAMUSCULAR | Status: AC
  Filled 2017-09-06: qty 20

## 2017-09-06 MED ORDER — IOPAMIDOL (ISOVUE-M 200) INJECTION 41%
INTRAMUSCULAR | Status: AC
  Administered 2017-09-06: 3 mL
  Filled 2017-09-06: qty 10

## 2017-09-06 MED ORDER — SODIUM CHLORIDE 0.9 % IJ SOLN
INTRAMUSCULAR | Status: AC
  Filled 2017-09-06: qty 10

## 2017-09-06 MED ORDER — LIDOCAINE HCL 1 % IJ SOLN
INTRAMUSCULAR | Status: DC | PRN
  Administered 2017-09-06: 5 mL

## 2017-09-06 NOTE — Progress Notes (Signed)
   Subjective:  Patient seen laying comfortably in bed this AM in no acute distress. Patient complains of continuing weakness in lower extremities but denies worsening of headaches and chest pain. Patient has no new, acute complaints.   Objective:  Vital signs in last 24 hours: Vitals:   09/05/17 0921 09/05/17 1412 09/05/17 2111 09/06/17 1048  BP: (!) 146/63 130/61 (!) 137/55 (!) 170/67  Pulse:  74 68   Resp:  18 18   Temp:  97.7 F (36.5 C) 97.9 F (36.6 C)   TempSrc:  Oral Oral   SpO2: 92% 93% 94%   Weight:      Height:       Physical Exam  Constitutional: She appears well-developed and well-nourished. No distress.  Cardiovascular: Normal rate, regular rhythm and intact distal pulses.  Exam reveals no friction rub.   No murmur heard. Pulmonary/Chest: Effort normal. No respiratory distress. She has no wheezes.  Abdominal: Soft. She exhibits no distension. There is no tenderness. There is no guarding.  Musculoskeletal: She exhibits no edema (of bilateral lower extremities) or tenderness (of bilateral lower extremities).  Neurological:  Face strength and sensation intact bilaterally. Tongue midline. Gross motor and sensation to light touch of upper and lower extremities intact bilaterally.   Skin: Skin is warm and dry. No rash noted. No erythema. No pallor.   Assessment/Plan:  Active Problems:   Post-dural puncture headache  Devani Rain is a 83 yo with a PMH of NICM, CHB s/p PPM (05/2015), HTN, HLD, COPD, CVA, prior DVT who presented with a chief complaint of headches that started 2-3 days after an epidural injection for treatment of chronic back pain. She was admitted to the internal medicine teaching service for management. The specific problems addressed during admission were as follows:  Post-dural puncture headache: The patient's headache has improved with laying flat, consistent with the diagnosis of post-dural puncture headache. Over the weekend patient's HA remained and  she elected for blood patch procedure with IR today. Will follow up after procedure, but patient most likely stable for d/c to SNF of choice today. -Follow up IR recommendations -SW consulted for SNF placement, appreciate recommendations  QDU:KRCVKFM'M BP 130s-140s/60s overnight.  -Continue amlodipine 2.5mg      Diet: HH VTE prophylaxis: SCD's while in bed Code Status: DNR Diet: Heart Healthy  Dispo: Anticipated discharge in approximately today pending SNF placement.   Rozann Lesches, MD 09/06/2017, 11:41 AM Pager: [phone]

## 2017-09-06 NOTE — Progress Notes (Signed)
CSW spoke to [facility] SN[facility] regarding patient's [facility] authorization. SNF started auth request over the weekend and are awaiting determination. CSW following and awaiting confirmation of auth.  Abigail Butts, LCSWA [phone]

## 2017-09-06 NOTE — Progress Notes (Signed)
Internal Medicine Attending:   I saw and examined the patient. I reviewed the resident's note and I agree with the resident's findings and plan as documented in the resident's note.  Patient is doing well today, still has persistent headache which is bothersome to her.  Plan change over the weekend because of the persistent headache to try blood patch with IR today.  I think we can transition her to a subacute rehabilitation after that procedure is completed.

## 2017-09-06 NOTE — Progress Notes (Signed)
   Pt is stable from a cardiology standpoint. Will sign off Call for questions    Kristeen Miss, MD  09/06/2017 11:36 AM    [facility] [address] [place],  [address] [place], [place]  [address] Pager [phone] Phone: [phone]; Fax: [phone]

## 2017-09-06 NOTE — Progress Notes (Signed)
Physical Therapy Treatment Patient Details Name: [name] [name] MRN: [id number] DOB: August 01, 1924 Today's Date: 09/06/2017    History of Present Illness Patient is a 83 y/o female who presents with HA that started 2-3 days after an epidural injection for treatment of chronic back pain. Found to have Post-dural puncture headache and acute CP during admission. s/p blood patch 10/29. PMH includes NICM, CHB s/p PPM (05/2015), HTN, HLD, COPD, CVA, prior DVT, pulmonary fibrosis, COPD.    PT Comments    Patient progressing well towards PT goals. Tolerated gait training with Min A for balance/safety as well as chair follow. VSS throughout. Continues to demonstrate impaired balance as pt with 3 LOB posteriorly when performing dynamic tasks. Pt high fall risk. HA improved. Pt plans to d/c to SNF later today.  Will follow if still in hospital.   Follow Up Recommendations  SNF;Supervision for mobility/OOB     Equipment Recommendations  None recommended by PT    Recommendations for Other Services       Precautions / Restrictions Precautions Precautions: Fall Restrictions Weight Bearing Restrictions: No    Mobility  Bed Mobility Overal bed mobility: Needs Assistance Bed Mobility: Supine to Sit     Supine to sit: Min guard;HOB elevated     General bed mobility comments: Able to get to EOB with increased time, use of rail.   Transfers Overall transfer level: Needs assistance Equipment used: Rolling walker (2 wheeled) Transfers: Sit to/from Stand Sit to Stand: Min assist         General transfer comment: Min A to steady in standing due to posterior lean. Stood from AllstateEOB x1, chair x1, BSC x1. SPT bed to chair Min A. No dizziness.   Ambulation/Gait Ambulation/Gait assistance: Min assist Ambulation Distance (Feet): 75 Feet Assistive device: Rolling walker (2 wheeled) Gait Pattern/deviations: Step-through pattern;Decreased stride length;Decreased stance time - left;Trunk flexed;Narrow  base of support Gait velocity: decreased Gait velocity interpretation: <1.8 ft/sec, indicative of risk for recurrent falls General Gait Details: Slow, unsteady gait with Min A for balance; narrow BoS and cues for RW proximity and management. VSS.   Stairs            Wheelchair Mobility    Modified Rankin (Stroke Patients Only)       Balance Overall balance assessment: Needs assistance Sitting-balance support: Feet supported;No upper extremity supported Sitting balance-Leahy Scale: Fair Sitting balance - Comments: supervision for safety.    Standing balance support: During functional activity;Bilateral upper extremity supported Standing balance-Leahy Scale: Poor Standing balance comment: Reilant on UEs for support in standing and external support. LOB x3 during pulling up pampers requiring assist to prevent fall.                             Cognition Arousal/Alertness: Awake/alert Behavior During Therapy: WFL for tasks assessed/performed Overall Cognitive Status: Within Functional Limits for tasks assessed                                        Exercises      General Comments General comments (skin integrity, edema, etc.): BP pre activity 145/67; post activity 154/68.      Pertinent Vitals/Pain Pain Assessment: Faces Faces Pain Scale: Hurts a little bit Pain Location: left side of neck Pain Descriptors / Indicators: Sore Pain Intervention(s): Monitored during session;Repositioned    Home Living  Prior Function            PT Goals (current goals can now be found in the care plan section) Progress towards PT goals: Progressing toward goals    Frequency    Min 3X/week      PT Plan Current plan remains appropriate    Co-evaluation              AM-PAC PT "6 Clicks" Daily Activity  Outcome Measure  Difficulty turning over in bed (including adjusting bedclothes, sheets and blankets)?:  None Difficulty moving from lying on back to sitting on the side of the bed? : None Difficulty sitting down on and standing up from a chair with arms (e.g., wheelchair, bedside commode, etc,.)?: Unable Help needed moving to and from a bed to chair (including a wheelchair)?: A Little Help needed walking in hospital room?: A Little Help needed climbing 3-5 steps with a railing? : Total 6 Click Score: 16    End of Session Equipment Utilized During Treatment: Gait belt Activity Tolerance: Patient tolerated treatment well Patient left: in chair;with call bell/phone within reach;with family/visitor present Nurse Communication: Mobility status PT Visit Diagnosis: Unsteadiness on feet (R26.81);Other abnormalities of gait and mobility (R26.89)     Time: 5859-2924 PT Time Calculation (min) (ACUTE ONLY): 30 min  Charges:  $Gait Training: 8-22 mins $Therapeutic Activity: 8-22 mins                    G Codes:       Mylo Red, PT, DPT [phone]     [name] [name] 09/06/2017, 2:13 PM

## 2017-09-06 NOTE — Progress Notes (Signed)
Patient ID: [name] [name], female   DOB: Mar 13, 1924, 83 y.o.   MRN: [id number]    Referring Physician(s): Dr. Oswaldo Done  Supervising Physician: Dr. Davonna Belling  Patient Status: [facility] - In-pt  Chief Complaint: Headache after ESI  Subjective: The patient has been admitted for a HAD s/p ESI on 10/15.  The patient states this was done at the sports medicine clinic.  The day after the procedure is when she developed a HA and overall not feeling well.  Her pain is mostly in the back of her head.  Position does not effect the pain.  Her pain is gradually improving and is between a 2-3/10 today.  A request has been made for blood patch for possible CSF leak from injection.  Allergies: Crestor [rosuvastatin]; Darvon; Meloxicam; Meperidine; Prednisone; Valsartan; Amiodarone; Aspirin-acetaminophen-caffeine; Nitroglycerin; and Tramadol  Medications: Prior to Admission medications   Medication Sig Start Date End Date Taking? Authorizing Provider  aspirin EC 81 MG tablet Take 1 tablet (81 mg total) by mouth daily. 05/14/17  Yes Duke Salvia, MD  DULoxetine (CYMBALTA) 30 MG capsule Take 30 mg by mouth daily.   Yes [provider]  traMADol (ULTRAM) 50 MG tablet Take 50 mg by mouth every 6 (six) hours as needed for moderate pain.  08/30/17  Yes [provider]  vitamin B-12 (CYANOCOBALAMIN) 1000 MCG tablet Take 1,000 mcg by mouth daily.   Yes [provider]  amLODipine (NORVASC) 2.5 MG tablet Take 1 tablet (2.5 mg total) by mouth daily. 09/05/17   Camelia Phenes, DO  Respiratory Therapy Supplies (FLUTTER) DEVI See admin instructions. Exercise lungs three to four (3-4) times a day as needed for flutter    [provider]    Vital Signs: BP (!) 137/55 (BP Location: Left Arm)   Pulse 68   Temp 97.9 F (36.6 C) (Oral)   Resp 18   Ht 5\' 6"  (1.676 m)   Wt 132 lb 15 oz (60.3 kg)   SpO2 94%   BMI 21.46 kg/m   Physical Exam: Gen: elderly WF, NAD Chest:  pacemaker in place, normal rhythm  Lungs: CTAB  Imaging: Ct Abdomen Pelvis Wo Contrast  Result Date: 09/02/2017 CLINICAL DATA:  Severe substernal chest pain radiating to the back. Negative EKG. Clinical concern for aortic dissection or pulmonary embolism. The examination was ordered as a CTA of the chest, abdomen and pelvis. However, the administered contrast extravasated into the left arm with no intravascular contrast demonstrated on the images. The patient was examined by Dr. Benito Mccreedy who document the extravasation and wrote post extravasation orders. Attempts to reestablish intravenous access for the study were unsuccessful. EXAM: CT CHEST, ABDOMEN AND PELVIS WITHOUT CONTRAST TECHNIQUE: Multidetector CT imaging of the chest, abdomen and pelvis was performed following the standard protocol without IV contrast. COMPARISON:  Chest CTA dated 06/27/2017. Pelvis CT dated 06/24/2017. Abdomen and pelvis CT dated 03/08/2015. FINDINGS: CT CHEST FINDINGS Cardiovascular: Atheromatous arterial calcifications, including the aorta and coronary arteries. No aortic aneurysm or displaced intimal calcifications seen. Mildly enlarged heart. Left subclavian pacemaker leads. Mediastinum/Nodes: No enlarged mediastinal, hilar, or axillary lymph nodes. Thyroid gland, trachea, and esophagus demonstrate no significant findings. Lungs/Pleura: Lingular and right middle lobe cylindrical bronchiectasis with associated parenchymal densities without significant change. Small amount of tree in bud opacity in the right lower lobe without significant change. Mild bibasilar linear atelectasis or scarring without significant change. The previously demonstrated 6 x 6 mm subpleural nodule in the right upper lobe is  significantly smaller, measuring 5 x 3 mm on image number 16 series 6. The previously demonstrated 6 x 6 mm left lower lobe subpleural nodular density is currently partially obscured by minimal dependent atelectasis, without gross  change. There are additional smaller nodules in the right lower lobe posteriorly on image number 36 series 6, without significant change. No pleural fluid is seen. Musculoskeletal: Thoracic spine degenerative changes. CT ABDOMEN PELVIS FINDINGS Hepatobiliary: No focal liver abnormality is seen. Status post cholecystectomy. No biliary dilatation. Pancreas: Moderate diffuse pancreatic atrophy. Spleen: Normal in size without focal abnormality. Adrenals/Urinary Tract: Multiple left renal parapelvic cysts are again demonstrated. No definite hydronephrosis. No urinary tract calculi. Normal appearing adrenal glands. Stomach/Bowel: Small hiatal hernia. Scattered colonic diverticula. Surgically absent appendix. Unremarkable small bowel. Vascular/Lymphatic: Atheromatous arterial calcifications without aneurysm or displaced intimal calcifications. No enlarged lymph nodes. Reproductive: Status post hysterectomy. No adnexal masses. Other: Anterior hernia repair mesh.  No hernia seen at this time. Musculoskeletal: L5-S1 degenerative changes. Approximately 40% L4 superior endplate compression deformity with mild bony retropulsion, without significant change since 03/08/2015. No acute fracture the lines. IMPRESSION: 1. Unsuccessful CTA of the chest, abdomen pelvis due to the contrast extravasation and inability to subsequently establish intravenous access for the CTA. 2. No aortic aneurysm or displaced intimal calcifications. 3. Decreased size of the previously demonstrated small right upper lobe nodule and no gross change in a small left lower lobe nodule, partially obscured by dependent atelectasis today and stable several small right lower lobe nodules. 4. Stable mild tree-in-bud opacities in the right lower lobe. This could be due to bronchiolitis. 5. Stable bronchiectasis in the right middle lobe and lingula. 6. Moderate diffuse pancreatic atrophy. 7. Small hiatal hernia. 8. Calcified coronary artery and aortic  atherosclerosis. Aortic Atherosclerosis (ICD10-I70.0). Electronically Signed   By: Beckie Salts M.D.   On: 09/02/2017 17:35   Ct Chest Wo Contrast  Result Date: 09/02/2017 CLINICAL DATA:  Severe substernal chest pain radiating to the back. Negative EKG. Clinical concern for aortic dissection or pulmonary embolism. The examination was ordered as a CTA of the chest, abdomen and pelvis. However, the administered contrast extravasated into the left arm with no intravascular contrast demonstrated on the images. The patient was examined by Dr. Benito Mccreedy who document the extravasation and wrote post extravasation orders. Attempts to reestablish intravenous access for the study were unsuccessful. EXAM: CT CHEST, ABDOMEN AND PELVIS WITHOUT CONTRAST TECHNIQUE: Multidetector CT imaging of the chest, abdomen and pelvis was performed following the standard protocol without IV contrast. COMPARISON:  Chest CTA dated 06/27/2017. Pelvis CT dated 06/24/2017. Abdomen and pelvis CT dated 03/08/2015. FINDINGS: CT CHEST FINDINGS Cardiovascular: Atheromatous arterial calcifications, including the aorta and coronary arteries. No aortic aneurysm or displaced intimal calcifications seen. Mildly enlarged heart. Left subclavian pacemaker leads. Mediastinum/Nodes: No enlarged mediastinal, hilar, or axillary lymph nodes. Thyroid gland, trachea, and esophagus demonstrate no significant findings. Lungs/Pleura: Lingular and right middle lobe cylindrical bronchiectasis with associated parenchymal densities without significant change. Small amount of tree in bud opacity in the right lower lobe without significant change. Mild bibasilar linear atelectasis or scarring without significant change. The previously demonstrated 6 x 6 mm subpleural nodule in the right upper lobe is significantly smaller, measuring 5 x 3 mm on image number 16 series 6. The previously demonstrated 6 x 6 mm left lower lobe subpleural nodular density is currently partially  obscured by minimal dependent atelectasis, without gross change. There are additional smaller nodules in the right lower lobe posteriorly on  image number 36 series 6, without significant change. No pleural fluid is seen. Musculoskeletal: Thoracic spine degenerative changes. CT ABDOMEN PELVIS FINDINGS Hepatobiliary: No focal liver abnormality is seen. Status post cholecystectomy. No biliary dilatation. Pancreas: Moderate diffuse pancreatic atrophy. Spleen: Normal in size without focal abnormality. Adrenals/Urinary Tract: Multiple left renal parapelvic cysts are again demonstrated. No definite hydronephrosis. No urinary tract calculi. Normal appearing adrenal glands. Stomach/Bowel: Small hiatal hernia. Scattered colonic diverticula. Surgically absent appendix. Unremarkable small bowel. Vascular/Lymphatic: Atheromatous arterial calcifications without aneurysm or displaced intimal calcifications. No enlarged lymph nodes. Reproductive: Status post hysterectomy. No adnexal masses. Other: Anterior hernia repair mesh.  No hernia seen at this time. Musculoskeletal: L5-S1 degenerative changes. Approximately 40% L4 superior endplate compression deformity with mild bony retropulsion, without significant change since 03/08/2015. No acute fracture the lines. IMPRESSION: 1. Unsuccessful CTA of the chest, abdomen pelvis due to the contrast extravasation and inability to subsequently establish intravenous access for the CTA. 2. No aortic aneurysm or displaced intimal calcifications. 3. Decreased size of the previously demonstrated small right upper lobe nodule and no gross change in a small left lower lobe nodule, partially obscured by dependent atelectasis today and stable several small right lower lobe nodules. 4. Stable mild tree-in-bud opacities in the right lower lobe. This could be due to bronchiolitis. 5. Stable bronchiectasis in the right middle lobe and lingula. 6. Moderate diffuse pancreatic atrophy. 7. Small hiatal  hernia. 8. Calcified coronary artery and aortic atherosclerosis. Aortic Atherosclerosis (ICD10-I70.0). Electronically Signed   By: Beckie Salts M.D.   On: 09/02/2017 17:35    Labs:  CBC:  Recent Labs  03/22/17 0932 06/27/17 1049 09/01/17 0650 09/02/17 1350  WBC 5.3 6.2 5.6 6.7  HGB 13.8 14.9 14.1 13.5  HCT 41.9 43.9 42.9 41.1  PLT 185 177 145* 131*    COAGS:  Recent Labs  12/22/16 1230 03/22/17 0932  INR 1.00 1.03  APTT 21*  --     BMP:  Recent Labs  09/03/17 0335 09/04/17 0402 09/05/17 0325 09/06/17 0416  NA 136 137 136 136  K 4.1 4.2 3.9 4.1  CL 103 103 103 103  CO2 23 24 25 26   GLUCOSE 109* 92 104* 115*  BUN 23* 25* 22* 32*  CALCIUM 9.4 9.2 9.0 8.9  CREATININE 1.02* 1.03* 1.02* 1.06*  GFRNONAA 47* 46* 47* 44*  GFRAA 54* 53* 54* 52*    LIVER FUNCTION TESTS:  Recent Labs  12/22/16 1230 09/01/17 0650  BILITOT 1.1 0.9  AST 22 22  ALT 14 16  ALKPHOS 49 51  PROT 6.9 6.2*  ALBUMIN 4.3 3.8    Assessment and Plan: 1. Headache, s/p ESI on 10/15  There is no record in our system of this procedure to determine a site or location of the [facility]; however, the patient states it was given to her on the left side.  I have discussed with the patient the procedure and that if she does have a CSF leak this will hopefully help her.  However, if the steroid was injected into her epidural space, then her HA is likely secondary to a chemical meningitis and that this procedure will not help.  If she does have a chemical meningitis, time is what will correct this problem.  The patient wants to proceed with a blood patch to see if this will help.  She is agreeable and has signed her consent which is in her chart.  Electronically Signed: Letha Cape 09/06/2017, 9:21 AM   I spent a  total of 25 Minutes at the the patient's bedside AND on the patient's hospital floor or unit, greater than 50% of which was counseling/coordinating care for headache

## 2017-09-07 DIAGNOSIS — Z886 Allergy status to analgesic agent status: Secondary | ICD-10-CM

## 2017-09-07 DIAGNOSIS — Z885 Allergy status to narcotic agent status: Secondary | ICD-10-CM

## 2017-09-07 DIAGNOSIS — Z888 Allergy status to other drugs, medicaments and biological substances status: Secondary | ICD-10-CM

## 2017-09-07 DIAGNOSIS — I951 Orthostatic hypotension: Secondary | ICD-10-CM

## 2017-09-07 LAB — BASIC METABOLIC PANEL
ANION GAP: 6 (ref 5–15)
BUN: 29 mg/dL — ABNORMAL HIGH (ref 6–20)
CHLORIDE: 104 mmol/L (ref 101–111)
CO2: 26 mmol/L (ref 22–32)
Calcium: 9.2 mg/dL (ref 8.9–10.3)
Creatinine, Ser: 1.01 mg/dL — ABNORMAL HIGH (ref 0.44–1.00)
GFR calc non Af Amer: 47 mL/min — ABNORMAL LOW (ref >60)
GFR, EST AFRICAN AMERICAN: 55 mL/min — AB (ref >60)
Glucose, Bld: 108 mg/dL — ABNORMAL HIGH (ref 65–99)
Potassium: 4.3 mmol/L (ref 3.5–5.1)
Sodium: 136 mmol/L (ref 135–145)

## 2017-09-07 MED ORDER — ACETAMINOPHEN-CODEINE #3 300-30 MG PO TABS
1.0000 | ORAL_TABLET | Freq: Once | ORAL | Status: AC
  Administered 2017-09-07: 1 via ORAL
  Filled 2017-09-07: qty 1

## 2017-09-07 MED ORDER — SODIUM CHLORIDE 0.9 % IV BOLUS (SEPSIS)
500.0000 mL | Freq: Once | INTRAVENOUS | Status: AC
  Administered 2017-09-07: 500 mL via INTRAVENOUS

## 2017-09-07 NOTE — Clinical Social Work Placement (Signed)
   CLINICAL SOCIAL WORK PLACEMENT  NOTE  Date:  09/07/2017  Patient Details  Name: [name] [name] MRN: [id number] Date of Birth: 11/06/1924  Clinical Social Work is seeking post-discharge placement for this patient at the Skilled  Nursing Facility level of care (*CSW will initial, date and re-position this form in  chart as items are completed):  Yes   Patient/family provided with [facility] Clinical Social Work Department's list of facilities offering this level of care within the geographic area requested by the patient (or if unable, by the patient's family).  Yes   Patient/family informed of their freedom to choose among providers that offer the needed level of care, that participate in Medicare, Medicaid or managed care program needed by the patient, have an available bed and are willing to accept the patient.  Yes   Patient/family informed of [facility]'s ownership interest in [facility] and [facility], as well as of the fact that they are under no obligation to receive care at these facilities.  PASRR submitted to EDS on       PASRR number received on       Existing PASRR number confirmed on 09/04/17     FL2 transmitted to all facilities in geographic area requested by pt/family on 09/04/17     FL2 transmitted to all facilities within larger geographic area on       Patient informed that his/her managed care company has contracts with or will negotiate with certain facilities, including the following:  [facility]     Yes   Patient/family informed of bed offers received.  Patient chooses bed at [facility]     Physician recommends and patient chooses bed at      Patient to be transferred to [facility] on 09/07/17.  Patient to be transferred to facility by son will drive     Patient family notified on 09/07/17 of transfer.  Name of family member notified:  [name], son     PHYSICIAN Please sign  FL2, Please prepare prescriptions, Please sign DNR, Please prepare priority discharge summary, including medications     Additional Comment:    _______________________________________________ Abigail Butts, LCSW 09/07/2017, 9:39 AM

## 2017-09-07 NOTE — Progress Notes (Signed)
Physical Therapy Treatment Patient Details Name: [name] [name] MRN: [id number] DOB: [birth year] Today's Date: 09/07/2017    History of Present Illness Patient is a 83 y/o female who presents with HA that started 2-3 days after an epidural injection for treatment of chronic back pain. Found to have Post-dural puncture headache and acute CP during admission. s/p blood patch 10/29. PMH includes NICM, CHB s/p PPM (05/2015), HTN, HLD, COPD, CVA, prior DVT, pulmonary fibrosis, COPD.    PT Comments    Patient's mobility limited today due to drop in BP during standing and most notably with exertion. Pt symptomatic reporting blurred vision and feeling like she was going to pass out when standing. Tolerated ADLs seated at sink and short distance ambulation with Min guard assist for safety. Pt with positive orthostatics from sitting to standing. RN made aware. These episodes are new as of today. See vitals below. Sitting BP pre activity 146/75 After walking BP 121/57 Sitting after 2 mins 157/70 Post standing BP 127/78  After transferring to chair BP after 3 mins 141/65 Pt not feeling great today but willing to participate. Still appropriate for SNF but needs to have BP addressed so pt can participate in mobility safely. Will follow.   Follow Up Recommendations  SNF;Supervision for mobility/OOB     Equipment Recommendations  None recommended by PT    Recommendations for Other Services       Precautions / Restrictions Precautions Precautions: Fall Precaution Comments: dizzy Restrictions Weight Bearing Restrictions: No    Mobility  Bed Mobility               General bed mobility comments: Standing upon PT arrival with tech.  Transfers Overall transfer level: Needs assistance Equipment used: Rolling walker (2 wheeled) Transfers: Sit to/from [employer] Sit to Stand: Mod assist Stand pivot transfers: Min assist       General transfer comment: Mod A to stand  from chair x3 with posterior lean. Increased effort and use of momentum. Dizziness and feeling like she is going to black out upon standing with exertion. SPT chair to recliner with MinA due to not feeling well.  Ambulation/Gait Ambulation/Gait assistance: Min guard Ambulation Distance (Feet): 14 Feet Assistive device: Rolling walker (2 wheeled) Gait Pattern/deviations: Step-through pattern;Decreased stride length;Decreased stance time - left;Trunk flexed;Narrow base of support Gait velocity: decreased Gait velocity interpretation: <1.8 ft/sec, indicative of risk for recurrent falls General Gait Details: Slow, unsteady gait with close min guard assist for support; narrow BoS and cues for RW proximity. BP drops with movement. See assessment.   Stairs            Wheelchair Mobility    Modified Rankin (Stroke Patients Only)       Balance Overall balance assessment: Needs assistance Sitting-balance support: Feet supported;No upper extremity supported Sitting balance-Leahy Scale: Fair Sitting balance - Comments: supervision for safety due to dizziness. Performed ADL task at sink brushign teeth and hair. Not able to tolerate in standing due to low BP/dizziness.   Standing balance support: During functional activity;Bilateral upper extremity supported Standing balance-Leahy Scale: Poor Standing balance comment: Reilant on UEs for support in standing and external support.                            Cognition Arousal/Alertness: Awake/alert Behavior During Therapy: WFL for tasks assessed/performed Overall Cognitive Status: Within Functional Limits for tasks assessed  Exercises      General Comments General comments (skin integrity, edema, etc.): Sitting BP pre activity 146/75, after walking BP 121/57, sitting after 2 mins 157/70, post standing BP 127/78 and after transferring to chair BP after 3 mins 141/65.       Pertinent Vitals/Pain Pain Assessment: Faces Faces Pain Scale: Hurts a little bit Pain Location: neck Pain Descriptors / Indicators: Sore Pain Intervention(s): Monitored during session;Repositioned    Home Living                      Prior Function            PT Goals (current goals can now be found in the care plan section) Progress towards PT goals: Not progressing toward goals - comment (secondary to drop in BP)    Frequency    Min 3X/week      PT Plan Current plan remains appropriate    Co-evaluation              AM-PAC PT "6 Clicks" Daily Activity  Outcome Measure  Difficulty turning over in bed (including adjusting bedclothes, sheets and blankets)?: None Difficulty moving from lying on back to sitting on the side of the bed? : None Difficulty sitting down on and standing up from a chair with arms (e.g., wheelchair, bedside commode, etc,.)?: Unable Help needed moving to and from a bed to chair (including a wheelchair)?: A Little Help needed walking in hospital room?: A Little Help needed climbing 3-5 steps with a railing? : Total 6 Click Score: 16    End of Session Equipment Utilized During Treatment: Gait belt Activity Tolerance: Treatment limited secondary to medical complications (Comment) (orthostatic BP) Patient left: in chair;with call bell/phone within reach;with family/visitor present;with chair alarm set Nurse Communication: Mobility status;Other (comment) (BP) PT Visit Diagnosis: Unsteadiness on feet (R26.81);Other abnormalities of gait and mobility (R26.89)     Time: [id number] PT Time Calculation (min) (ACUTE ONLY): 35 min  Charges:  $Gait Training: 8-22 mins $Therapeutic Activity: 8-22 mins                    G Codes:       Mylo RedShauna Dolan Xia, PT, DPT [phone]     Blake DivineShauna A Brayan Votaw 09/07/2017, 10:27 AM

## 2017-09-07 NOTE — Progress Notes (Signed)
Patient and family received discharge information and acknowledged understanding of it. RN answered all questions. Patient IVs were removed.

## 2017-09-07 NOTE — Progress Notes (Signed)
CSW staffed case with supervisor, Wandra Mannan. CSW pursued LOG SNF beds for patient, as [facility] was pending and still not received by end of day for [facility]. CSW still does not have auth and did not receive any LOG bed offer. [facility] looked at patient's referral but had not given answer by end of day on LOG. CSW discussed disposition plan with patient, spouse, and son at bedside. Patient and family agreeable to discharge home with home health. RNCM aware and following for discharge planning. MD aware. CSW signing off as patient will discharge home.  Abigail Butts, LCSWA [phone]

## 2017-09-07 NOTE — Care Management Note (Addendum)
Case Management Note  Patient Details  Name: [name] [name] MRN: [id number] Date of Birth: 1924/04/21  Subjective/Objective: Pt presented for headache post lumbar epidural steroid injection. Pt is from home with husband. Plan was for SNF bed and unable to get a LOG for SNF. Family at bedside and is agreeable to [facility]- RN, PT/OT, Aide and SW.                    Action/Plan: Agency List provided and pt/ family chose [facility]. Referral to be faxed to agency and SOC to begin within 24-48 hours post d/c. No further needs from CM at this time.   Expected Discharge Date:  09/07/17               Expected Discharge Plan:  Home w Home Health Services  In-House Referral:  Clinical Social Work (Unable to get a SNF LOG Bed. )  Discharge planning Services  CM Consult  Post Acute Care Choice:  Home Health Choice offered to:  Patient, Adult Children, Spouse  DME Arranged:  N/A DME Agency:  NA  HH Arranged:  RN, Disease Management, PT, OT, Nurse's Aide, Social Work [employer] Agency:  [facility]  Status of Service:  Completed, signed off  If discussed at [employer] of Stay Meetings, dates discussed:    Additional Comments: 1144 09-08-17 Tomi Bamberger, RN,BSN [phone] CM received information regarding that pt's insurance has approved SNF at [facility]. CM did call [facility] to cancel [facility]. CM did speak with son Lloyd Huger in regards to plan of care. No further needs from CM at this time.  Gala Lewandowsky, RN 09/07/2017, 4:43 PM

## 2017-09-07 NOTE — Progress Notes (Signed)
   Subjective:  Patient was seen laying comfortably in bed this AM in no acute distress. She states her headache is much improved and that she feels overall well. Patient states that she was able to walk yesterday with physical therapy and that she felt good after doing that.   Objective:  Vital signs in last 24 hours: Vitals:   09/06/17 1048 09/06/17 1300 09/06/17 1950 09/07/17 0520  BP: (!) 170/67 (!) 135/57 (!) 167/66 (!) 154/76  Pulse:   75 73  Resp:      Temp:  (!) 97.2 F (36.2 C) 97.8 F (36.6 C) 97.8 F (36.6 C)  TempSrc:   Oral Oral  SpO2:   94% 95%  Weight:    136 lb 4.8 oz (61.8 kg)  Height:       Physical Exam  Constitutional: She appears well-developed and well-nourished. No distress.  Eyes: Pupils are equal, round, and reactive to light. EOM are normal.  Cardiovascular: Normal rate, regular rhythm and intact distal pulses.  Exam reveals no friction rub.   No murmur heard. Pulmonary/Chest: Effort normal. No respiratory distress. She has no wheezes. She has no rales.  Neurological:  Face strength and sensation intact bilaterally. Tongue midline. Gross motor and sensation to light touch of upper and lower extremities intact bilaterally.  Skin: Skin is warm and dry. No rash noted. No erythema.   Assessment/Plan:  Active Problems:   Post-dural puncture headache  [name] [name] is a 83 yo with a PMH of NICM, CHB s/p PPM (05/2015), HTN, HLD, COPD, CVA, prior DVT who presented with a chief complaint of headches that started 2-3 days after an epidural injection for treatment of chronic back pain. She was admitted to the internal medicine teaching service for management. The specific problems addressed during admission were as follows:  Post-dural puncture headache: Patient's headache improved with conservative measures, but continued pain resulted in IR blood patch for treatment of post-LP headache yesterday afternoon. Patient tolerated procedure well and reports improvement  in headache today. Will continue supportive care with Tylenol, but patient medically stable for discharge after procedure.  -SW consulted for SNF placement, appreciate recommendations -Continue working with PT/OT while inpatient to regain strength.   Orthostatic hypotension: Patient had symptomatic hypotension while working with PT this AM. Likely 2/2 decreased PO intake, as patient has not received fluids while in hospital and was not eating prior to blood patch procedure yesterday. Will provide NS bolus and recheck orthostatics. -500 mL NS over 2 hours -Obtain orthostatic vital signs after bolus  RCV:ELFYBOF'B BP 140s-150s/60s overnight.  -Continue amlodipine 2.5mg , consider increasing as outpatient for improved BP control  Diet:HH VTE prophylaxis: SCD's while in bed Code Status:DNR Diet: Heart Healthy  Dispo: Anticipated discharge today to SNF.   Rozann Lesches, MD 09/07/2017, 7:34 AM Pager: [phone]

## 2017-10-05 ENCOUNTER — Other Ambulatory Visit: Payer: Self-pay | Admitting: Internal Medicine

## 2017-10-12 DIAGNOSIS — R0609 Other forms of dyspnea: Secondary | ICD-10-CM

## 2017-10-12 DIAGNOSIS — R0600 Dyspnea, unspecified: Secondary | ICD-10-CM

## 2017-10-12 HISTORY — DX: Other forms of dyspnea: R06.09

## 2017-10-12 HISTORY — DX: Dyspnea, unspecified: R06.00

## 2017-10-27 DIAGNOSIS — R5383 Other fatigue: Secondary | ICD-10-CM

## 2017-10-27 DIAGNOSIS — R5381 Other malaise: Secondary | ICD-10-CM

## 2017-10-27 HISTORY — DX: Other malaise: R53.81

## 2017-11-10 ENCOUNTER — Telehealth: Payer: Self-pay | Admitting: Cardiology

## 2017-11-10 ENCOUNTER — Ambulatory Visit (INDEPENDENT_AMBULATORY_CARE_PROVIDER_SITE_OTHER): Payer: Medicare HMO | Admitting: *Deleted

## 2017-11-10 DIAGNOSIS — I442 Atrioventricular block, complete: Secondary | ICD-10-CM | POA: Diagnosis not present

## 2017-11-10 NOTE — Telephone Encounter (Signed)
Spoke with patient and reviewed remote transmission. I explained that the episodes seen on patient's remote showed an increase in AF/AT over the past couple of days correlating with her symptoms of increased fatigue and ShOB. I offered an appointment with the Afib clinic. Patient declined appointment stating that she would prefer to see her PCP of whom she had an upcoming appointment scheduled. I encouraged her to call back if she changed her mind. Patient verbalized understanding.

## 2017-11-10 NOTE — Telephone Encounter (Signed)
Spoke with pt and reminded pt of remote transmission that is due today. Pt verbalized understanding.   

## 2017-11-10 NOTE — Telephone Encounter (Signed)
Called pt to confirm remote appt. Pt stated that she has not felt well lately. Really fatigued and more short of breath. Informed pt that I would forward message to Device Tech RN and have someone call her back.

## 2017-11-11 NOTE — Progress Notes (Signed)
Remote pacemaker transmission.   

## 2017-11-12 ENCOUNTER — Encounter: Payer: Self-pay | Admitting: Cardiology

## 2017-11-15 LAB — CUP PACEART REMOTE DEVICE CHECK
Battery Impedance: 181 Ohm
Battery Remaining Longevity: 115 mo
Brady Statistic AP VP Percent: 31 %
Brady Statistic AS VP Percent: 69 %
Implantable Lead Implant Date: 20160705
Implantable Lead Location: 753859
Implantable Lead Model: 5076
Implantable Pulse Generator Implant Date: 20160705
Lead Channel Impedance Value: 515 Ohm
Lead Channel Setting Pacing Amplitude: 2 V
Lead Channel Setting Pacing Amplitude: 2.5 V
Lead Channel Setting Pacing Pulse Width: 0.4 ms
Lead Channel Setting Sensing Sensitivity: 4 mV
MDC IDC LEAD IMPLANT DT: 20160705
MDC IDC LEAD LOCATION: 753860
MDC IDC MSMT BATTERY VOLTAGE: 2.79 V
MDC IDC MSMT LEADCHNL RA PACING THRESHOLD AMPLITUDE: 0.5 V
MDC IDC MSMT LEADCHNL RA PACING THRESHOLD PULSEWIDTH: 0.4 ms
MDC IDC MSMT LEADCHNL RV IMPEDANCE VALUE: 537 Ohm
MDC IDC MSMT LEADCHNL RV PACING THRESHOLD AMPLITUDE: 0.375 V
MDC IDC MSMT LEADCHNL RV PACING THRESHOLD PULSEWIDTH: 0.4 ms
MDC IDC SESS DTM: 20190102202648
MDC IDC STAT BRADY AP VS PERCENT: 0 %
MDC IDC STAT BRADY AS VS PERCENT: 0 %

## 2017-11-25 ENCOUNTER — Observation Stay (HOSPITAL_COMMUNITY)
Admission: EM | Admit: 2017-11-25 | Discharge: 2017-11-27 | Disposition: A | Discharge status: Home / Self Care | Payer: Medicare HMO | Attending: Internal Medicine | Admitting: Internal Medicine

## 2017-11-25 ENCOUNTER — Emergency Department (HOSPITAL_COMMUNITY): Payer: Medicare HMO

## 2017-11-25 ENCOUNTER — Other Ambulatory Visit: Payer: Self-pay

## 2017-11-25 ENCOUNTER — Encounter (HOSPITAL_COMMUNITY): Payer: Self-pay

## 2017-11-25 DIAGNOSIS — I10 Essential (primary) hypertension: Secondary | ICD-10-CM | POA: Diagnosis not present

## 2017-11-25 DIAGNOSIS — I2510 Atherosclerotic heart disease of native coronary artery without angina pectoris: Secondary | ICD-10-CM | POA: Diagnosis not present

## 2017-11-25 DIAGNOSIS — J449 Chronic obstructive pulmonary disease, unspecified: Secondary | ICD-10-CM | POA: Diagnosis not present

## 2017-11-25 DIAGNOSIS — I429 Cardiomyopathy, unspecified: Secondary | ICD-10-CM | POA: Insufficient documentation

## 2017-11-25 DIAGNOSIS — R531 Weakness: Principal | ICD-10-CM

## 2017-11-25 DIAGNOSIS — R079 Chest pain, unspecified: Secondary | ICD-10-CM | POA: Diagnosis present

## 2017-11-25 DIAGNOSIS — I119 Hypertensive heart disease without heart failure: Secondary | ICD-10-CM | POA: Diagnosis present

## 2017-11-25 DIAGNOSIS — N390 Urinary tract infection, site not specified: Secondary | ICD-10-CM | POA: Diagnosis not present

## 2017-11-25 DIAGNOSIS — Z79899 Other long term (current) drug therapy: Secondary | ICD-10-CM | POA: Insufficient documentation

## 2017-11-25 DIAGNOSIS — Z950 Presence of cardiac pacemaker: Secondary | ICD-10-CM | POA: Diagnosis not present

## 2017-11-25 DIAGNOSIS — Z96652 Presence of left artificial knee joint: Secondary | ICD-10-CM | POA: Insufficient documentation

## 2017-11-25 DIAGNOSIS — Z7982 Long term (current) use of aspirin: Secondary | ICD-10-CM | POA: Diagnosis not present

## 2017-11-25 HISTORY — DX: Weakness: R53.1

## 2017-11-25 LAB — TSH: TSH: 1.429 u[IU]/mL (ref 0.350–4.500)

## 2017-11-25 LAB — CBC WITH DIFFERENTIAL/PLATELET
Basophils Absolute: 0 10*3/uL (ref 0.0–0.1)
Basophils Relative: 1 %
EOS PCT: 2 %
Eosinophils Absolute: 0.1 10*3/uL (ref 0.0–0.7)
HCT: 38.6 % (ref 36.0–46.0)
Hemoglobin: 12.5 g/dL (ref 12.0–15.0)
LYMPHS ABS: 1.5 10*3/uL (ref 0.7–4.0)
LYMPHS PCT: 29 %
MCH: 30.3 pg (ref 26.0–34.0)
MCHC: 32.4 g/dL (ref 30.0–36.0)
MCV: 93.7 fL (ref 78.0–100.0)
Monocytes Absolute: 0.4 10*3/uL (ref 0.1–1.0)
Monocytes Relative: 7 %
Neutro Abs: 3.2 10*3/uL (ref 1.7–7.7)
Neutrophils Relative %: 61 %
Platelets: 172 10*3/uL (ref 150–400)
RBC: 4.12 MIL/uL (ref 3.87–5.11)
RDW: 13 % (ref 11.5–15.5)
WBC: 5.2 10*3/uL (ref 4.0–10.5)

## 2017-11-25 LAB — COMPREHENSIVE METABOLIC PANEL
ALK PHOS: 43 U/L (ref 38–126)
ALT: 11 U/L — AB (ref 14–54)
AST: 20 U/L (ref 15–41)
Albumin: 3.6 g/dL (ref 3.5–5.0)
Anion gap: 9 (ref 5–15)
BUN: 14 mg/dL (ref 6–20)
CALCIUM: 9.1 mg/dL (ref 8.9–10.3)
CO2: 24 mmol/L (ref 22–32)
CREATININE: 1.12 mg/dL — AB (ref 0.44–1.00)
Chloride: 108 mmol/L (ref 101–111)
GFR, EST AFRICAN AMERICAN: 48 mL/min — AB (ref >60)
GFR, EST NON AFRICAN AMERICAN: 42 mL/min — AB (ref >60)
Glucose, Bld: 96 mg/dL (ref 65–99)
Potassium: 3.7 mmol/L (ref 3.5–5.1)
Sodium: 141 mmol/L (ref 135–145)
TOTAL PROTEIN: 5.9 g/dL — AB (ref 6.5–8.1)
Total Bilirubin: 0.6 mg/dL (ref 0.3–1.2)

## 2017-11-25 LAB — URINALYSIS, ROUTINE W REFLEX MICROSCOPIC
Bilirubin Urine: NEGATIVE
GLUCOSE, UA: NEGATIVE mg/dL
Ketones, ur: NEGATIVE mg/dL
Nitrite: NEGATIVE
PH: 6 (ref 5.0–8.0)
Protein, ur: NEGATIVE mg/dL
SPECIFIC GRAVITY, URINE: 1.005 (ref 1.005–1.030)

## 2017-11-25 LAB — TROPONIN I: Troponin I: <0.03 ng/mL (ref <0.03)

## 2017-11-25 MED ORDER — ACETAMINOPHEN 325 MG PO TABS: 650.0000 mg | ORAL_TABLET | Freq: Four times a day (QID) | ORAL | Status: DC | PRN

## 2017-11-25 MED ORDER — ACETAMINOPHEN 650 MG RE SUPP: 650.0000 mg | Freq: Four times a day (QID) | RECTAL | Status: DC | PRN

## 2017-11-25 MED ORDER — ONDANSETRON HCL 4 MG PO TABS: 4.0000 mg | ORAL_TABLET | Freq: Four times a day (QID) | ORAL | Status: DC | PRN

## 2017-11-25 MED ORDER — ASPIRIN EC 81 MG PO TBEC
81.0000 mg | DELAYED_RELEASE_TABLET | Freq: Every day | ORAL | Status: DC
  Administered 2017-11-26: 81 mg via ORAL
  Filled 2017-11-25: qty 1

## 2017-11-25 MED ORDER — ONDANSETRON HCL 4 MG/2ML IJ SOLN: 4.0000 mg | Freq: Four times a day (QID) | INTRAMUSCULAR | Status: DC | PRN

## 2017-11-25 MED ORDER — VITAMIN B-12 1000 MCG PO TABS
1000.0000 ug | ORAL_TABLET | Freq: Every day | ORAL | Status: DC
  Administered 2017-11-26 – 2017-11-27 (×2): 1000 ug via ORAL
  Filled 2017-11-25 (×2): qty 1

## 2017-11-25 MED ORDER — SODIUM CHLORIDE 0.9 % IV SOLN
INTRAVENOUS | Status: DC
  Administered 2017-11-25: 125 mL/h via INTRAVENOUS

## 2017-11-25 MED ORDER — ENOXAPARIN SODIUM 40 MG/0.4ML ~~LOC~~ SOLN
40.0000 mg | Freq: Every day | SUBCUTANEOUS | Status: DC
  Filled 2017-11-25: qty 0.4

## 2017-11-25 NOTE — ED Notes (Signed)
[employer] interrogator is at patient bedside.

## 2017-11-25 NOTE — ED Notes (Signed)
Husband:  [phone] or daughter: Loletha Grayer:  [phone]

## 2017-11-25 NOTE — H&P (Signed)
History and Physical    [name] [name] EYC:[id number] DOB: Dec 21, 1923 DOA: 11/25/2017  PCP: Algis Greenhouse, MD  Patient coming from: Home.  Chief Complaint: Weakness and chest pain.  HPI: [name] [name] is a 84 y.o. female with complete heart block status post pacemaker placement, history of stroke, hypertension, COPD presents to the ER with complaints of having progressive weakness and has been having chest pain.  Patient states patient has been having these symptoms for last 2 months which has been progressive worsening.  Denies any dizziness or loss of consciousness.  Chest pain happens off and on every day even at rest.  Pain is mostly retrosternal nonradiating pressure-like.  Patient had recently followed up with her cardiologist at [name].  Patient was noticed to have atrial tachycardia and was started on Cardizem CD.  Patient is at a started.   ED Course: In the ER patient is presently chest pain-free.  EKG shows paced rhythm and chest x-ray unremarkable.  Patient is being admitted for further observation of generalized weakness and chest pain.  As per the cardiology notes from patient's primary cardiologist plan was to have a stress test done as outpatient.  Review of Systems: As per HPI, rest all negative.   Past Medical History:  Diagnosis Date  . Arthritis   . Bronchiectasis, non-tuberculous ([place])    a. Followed by Dr. Joya Gaskins.  . Colon polyps   . Complete heart block (HCC)    a. s/p MDT dual chamber PPM   . COPD (chronic obstructive pulmonary disease) ([place])    past hx with pneumonia  . Diverticulosis   . DVT (deep venous thrombosis) (HCC)    Resolved, no anticoagulation currently  . GERD (gastroesophageal reflux disease)   . Headache   . High cholesterol   . Hypertension   . IBS (irritable bowel syndrome)   . 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 MI (myocardial infarction) ?? probably not 1975   a. 1975 Reported MI;  b. 2011 Cath: nonobs dzs;   c. 2013 Nonischemic Myoview;  d. 12/2012 Echo: EF 60-65%, Gr1 DD.  . Nephrolithiasis   . Palpitation    a. 2010 Event Monitor: PACs and PVCs  . Pneumonia   . PONV (postoperative nausea and vomiting)   . Post-dural puncture headache 09/01/2017  . Presence of permanent cardiac pacemaker   . Pulmonary fibrosis ([place])   . Stroke ([name])   . Wears dentures    full  . Wide-complex tachycardia ([place])    a. Followed by Dr. Caryl Comes, "I think this is VT, but i am not sure QRSd 135 1:1 AV."    Past Surgical History:  Procedure Laterality Date  . APPENDECTOMY    . BLADDER SUSPENSION     tack  . CARDIAC CATHETERIZATION Right 05/14/2015   Procedure: Temporary Pacemaker;  Surgeon: Thompson Grayer, MD;  Location: [facility];  Service: Cardiovascular;  Laterality: Right;  . CHOLECYSTECTOMY    . COLON SURGERY     12inches colectomy s/p diverticulitis  . COLONOSCOPY W/ BIOPSIES AND POLYPECTOMY    . EP IMPLANTABLE DEVICE N/A 05/14/2015   Procedure: Pacemaker Implant;  Surgeon: Thompson Grayer, MD; [place] 1 (serial number [id number])     . IR FL GUIDED LOC OF NEEDLE/CATH TIP FOR SPINAL INJECTION LT  09/06/2017  . KNEE ARTHROSCOPY Left 03/29/2017   Procedure: ARTHROSCOPY LEFT KNEE WITH PARTIAL MEDIAL AND PARTIAL LATERAL MENISCECTOMY;  Surgeon: Vickey Huger, MD;  Location:  [facility];  Service: Orthopedics;  Laterality: Left;  Marland Kitchen MULTIPLE TOOTH EXTRACTIONS    . TONSILLECTOMY    . TOTAL ABDOMINAL HYSTERECTOMY    . TOTAL KNEE ARTHROPLASTY  12/12/2012   Procedure: TOTAL KNEE ARTHROPLASTY;  Surgeon: Alta Corning, MD;  Location: [facility];  Service: Orthopedics;  Laterality: Right;  right total knee arthroplasty     reports that  has never smoked. she has never used smokeless tobacco. She reports that she does not drink alcohol or use drugs.  Allergies  Allergen Reactions  . Crestor [Rosuvastatin] Other (See Comments)    Myalgia  . Darvon Other (See Comments)    Drop in  BP  . Meloxicam Other  (See Comments)    Dizziness   . Meperidine Other (See Comments)    Hypotension  . Prednisone Diarrhea    POLYURIA  . Valsartan Other (See Comments)     Hypotension  . Amiodarone Other (See Comments)    Did not tolerate - unknown reaction  . Aspirin-Acetaminophen-Caffeine Other (See Comments)    Unknown reaction  . Nitroglycerin     Profound hypotension with sublingual nitroglycerin.   . Tramadol Other (See Comments)    Unknown reaction    Family History  Problem Relation Age of Onset  . Heart disease Father   . Heart attack Mother   . Stomach cancer Brother   . Other Son        Triple Bypass    Prior to Admission medications   Medication Sig Start Date End Date Taking? Authorizing Provider  aspirin EC 81 MG tablet Take 1 tablet (81 mg total) by mouth daily. 05/14/17  Yes Deboraha Sprang, MD  vitamin B-12 (CYANOCOBALAMIN) 1000 MCG tablet Take 1,000 mcg by mouth daily.   Yes [provider]  amLODipine (NORVASC) 2.5 MG tablet Take 1 tablet (2.5 mg total) by mouth daily. Patient not taking: Reported on 11/25/2017 09/05/17   Valinda Party, DO    Physical Exam: Vitals:   11/25/17 1559 11/25/17 1605 11/25/17 1952 11/25/17 2146  BP: (!) 153/71  (!) 170/66 (!) 165/65  Pulse: 75  62 73  Resp: (!) 22  20 (!) 21  Temp:  98 F (36.7 C)    TempSrc:  Oral    SpO2: 96%  97% 97%  Weight: 62.1 kg (137 lb)     Height: 5' 6"  (1.676 m)         Constitutional: Moderately built and nourished. Vitals:   11/25/17 1559 11/25/17 1605 11/25/17 1952 11/25/17 2146  BP: (!) 153/71  (!) 170/66 (!) 165/65  Pulse: 75  62 73  Resp: (!) 22  20 (!) 21  Temp:  98 F (36.7 C)    TempSrc:  Oral    SpO2: 96%  97% 97%  Weight: 62.1 kg (137 lb)     Height: 5' 6"  (1.676 m)      Eyes: Anicteric no pallor. ENMT: No discharge from the ears eyes nose or mouth. Neck: No mass felt.  No neck rigidity. Respiratory: No rhonchi or crepitations. Cardiovascular: S1-S2 heard no murmurs  appreciated. Abdomen: Soft nontender bowel sounds present. Musculoskeletal: No edema.  No joint effusion. Skin: No rash.  Skin appears warm. Neurologic: Alert awake oriented to time place and person.  Moves all extremities. Psychiatric: Appears normal.  Normal affect.   Labs on Admission: I have personally reviewed following labs and imaging studies  CBC: Recent Labs  Lab 11/25/17 1613  WBC 5.2  NEUTROABS  3.2  HGB 12.5  HCT 38.6  MCV 93.7  PLT 076   Basic Metabolic Panel: Recent Labs  Lab 11/25/17 1613  NA 141  K 3.7  CL 108  CO2 24  GLUCOSE 96  BUN 14  CREATININE 1.12*  CALCIUM 9.1   GFR: Estimated Creatinine Clearance: 30.6 mL/min (A) (by C-G formula based on SCr of 1.12 mg/dL (H)). Liver Function Tests: Recent Labs  Lab 11/25/17 1613  AST 20  ALT 11*  ALKPHOS 43  BILITOT 0.6  PROT 5.9*  ALBUMIN 3.6   No results for input(s): LIPASE, AMYLASE in the last 168 hours. No results for input(s): AMMONIA in the last 168 hours. Coagulation Profile: No results for input(s): INR, PROTIME in the last 168 hours. Cardiac Enzymes: Recent Labs  Lab 11/25/17 1613  TROPONINI <0.03   BNP (last 3 results) No results for input(s): PROBNP in the last 8760 hours. HbA1C: No results for input(s): HGBA1C in the last 72 hours. CBG: No results for input(s): GLUCAP in the last 168 hours. Lipid Profile: No results for input(s): CHOL, HDL, LDLCALC, TRIG, CHOLHDL, LDLDIRECT in the last 72 hours. Thyroid Function Tests: Recent Labs    11/25/17 1613  TSH 1.429   Anemia Panel: No results for input(s): VITAMINB12, FOLATE, FERRITIN, TIBC, IRON, RETICCTPCT in the last 72 hours. Urine analysis:    Component Value Date/Time   COLORURINE YELLOW 11/25/2017 [place] 11/25/2017 1613   LABSPEC 1.005 11/25/2017 1613   PHURINE 6.0 11/25/2017 1613   GLUCOSEU NEGATIVE 11/25/2017 1613   HGBUR SMALL (A) 11/25/2017 1613   BILIRUBINUR NEGATIVE 11/25/2017 1613    KETONESUR NEGATIVE 11/25/2017 1613   PROTEINUR NEGATIVE 11/25/2017 1613   UROBILINOGEN 0.2 12/06/2012 0916   NITRITE NEGATIVE 11/25/2017 1613   LEUKOCYTESUR LARGE (A) 11/25/2017 1613   Sepsis Labs: @LABRCNTIP (procalcitonin:4,lacticidven:4) )No results found for this or any previous visit (from the past 240 hour(s)).   Radiological Exams on Admission: Dg Chest 2 View  Result Date: 11/25/2017 CLINICAL DATA:  Mid sternal chest pain EXAM: CHEST  2 VIEW COMPARISON:  06/27/2017 FINDINGS: Mild hyperinflation with minimal scarring at the bases. No focal consolidation or effusion. Mild cardiomegaly with aortic atherosclerosis. No pneumothorax. IMPRESSION: No active cardiopulmonary disease.  Mild cardiomegaly Electronically Signed   By: Donavan Foil M.D.   On: 11/25/2017 16:48    EKG: Independently reviewed.  Paced rhythm.  Assessment/Plan Principal Problem:   Weakness generalized Active Problems:   Hypertension   Chest pain with low risk for cardiac etiology    1. Generalized weakness -will check TSH CK levels get physical therapy consult.  UA shows possible UTI for which I have placed patient on ceftriaxone.  Check orthostatics. 2. Chest pain -has been ongoing and been chronic.  Previous history of DVT will check d-dimer if positive will get CT angiogram of the chest.  Cycle cardiac markers check 2D echo. 3. History of nonischemic cardiomyopathy presently not on any medications. 4. History of complete heart block status post pacemaker placement. 5. History of previous stroke mostly on aspirin. 6. Hypertension has not been taking any medications due to labile blood pressures.  Check orthostatics. 7. History of atrial tachycardia and was recently placed on Cardizem which patient is not very keen to take at this time but if patient agrees in the morning may place the order.   DVT prophylaxis: Lovenox. Code Status: DNR. Family Communication: Discussed with patient. Disposition Plan:  Home. Consults called: Physical therapy. Admission status: Observation.  Rise Patience MD [facility] Pager [phone].  If 7PM-7AM, please contact night-coverage [email or web address] Password [facility]  11/25/2017, 10:42 PM

## 2017-11-25 NOTE — ED Triage Notes (Signed)
Pt presents with 3 week h/o mid-sternal chest pain with shortness of breath and weakness.  Pt describes as a smothering feeling.  Pt was seen at [facility] and referred here.

## 2017-11-25 NOTE — ED Provider Notes (Signed)
[facility] EMERGENCY DEPARTMENT Provider Note   CSN: [id number] Arrival date & time: 11/25/17  1544     History   Chief Complaint Chief Complaint  Patient presents with  . Chest Pain    HPI [name] is a 84 y.o. female.  HPI  The patient is a 84 year old female, she has a known history of complete heart block and is required a dual-chamber pacemaker, she has a history of COPD, DVT, high blood pressure, she has had a myocardial infarction many years ago but the details are unclear, she had a 2011 heart catheterization which was nonobstructive, a 2013 nonischemic Myoview and a 2014 echocardiogram showing mild grade 1 diastolic dysfunction with a normal ejection fraction.  The patient has been in her usual state of health until sometime in the recent past, it is unclear exactly how many days this has been going on but it is been going on for some time such that her generalized weakness has been worked up by her family doctor by ordering a echocardiogram which has not yet been performed.  Because her weakness is becoming so severe they recommended that she come to the emergency department.  The patient has a smothering feeling in her chest, she feels like she is unable to breathe and when she tries to do anything she has severe weakness.  Her weakness is present at rest also but not as bad as when she tries to exert herself.  No fevers, occasional coughing, mild swelling of the left leg which is chronic secondary to her prior stroke leaving her with left-sided weakness.  She takes a baby aspirin and a B12 however she takes no other daily medications as her doctor recently took her off antihypertensives for unclear reasons.  Cardiologist: Dr. Caryl Comes PCP - in [place] [employer] is [employer]  Past Medical History:  Diagnosis Date  . Arthritis   . Bronchiectasis, non-tuberculous ([place])    a. Followed by Dr. Joya Gaskins.  . Colon polyps   . Complete heart block (HCC)    a. s/p  MDT dual chamber PPM   . COPD (chronic obstructive pulmonary disease) ([place])    past hx with pneumonia  . Diverticulosis   . DVT (deep venous thrombosis) (HCC)    Resolved, no anticoagulation currently  . GERD (gastroesophageal reflux disease)   . Headache   . High cholesterol   . Hypertension   . IBS (irritable bowel syndrome)   . 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 MI (myocardial infarction) ?? probably not 1975   a. 1975 Reported MI;  b. 2011 Cath: nonobs dzs;  c. 2013 Nonischemic Myoview;  d. 12/2012 Echo: EF 60-65%, Gr1 DD.  . Nephrolithiasis   . Palpitation    a. 2010 Event Monitor: PACs and PVCs  . Pneumonia   . PONV (postoperative nausea and vomiting)   . Post-dural puncture headache 09/01/2017  . Presence of permanent cardiac pacemaker   . Pulmonary fibrosis ([place])   . Stroke ([place])   . Wears dentures    full  . Wide-complex tachycardia ([place])    a. Followed by Dr. Caryl Comes, "I think this is VT, but i am not sure QRSd 135 1:1 AV."    Patient Active Problem List   Diagnosis Date Noted  . Post-dural puncture headache 09/01/2017  . Chest pain with low risk for cardiac etiology 11/18/2015  . Pacemaker   . Bradycardia with 31 - 40 beats per minute 05/14/2015  . Heart block   .  Pain in limb 08/16/2013  . Varicose veins of lower extremities with other complications 27/01/5008  . Edema -lower extremity 06/22/2013  . Syncope 12/15/2012  . Osteoarthritis of right knee 12/12/2012  . History of pulmonary embolism 08/30/2012  . Fatigue 08/19/2012  . Wide-complex tachycardia ([place]) 05/30/2012  . Bronchiectasis, non-tuberculous ([place]) 01/06/2012  . 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 Diverticulosis   . High cholesterol   . Coronary artery disease prior MI   . Hypertension     Past Surgical History:  Procedure Laterality Date  . APPENDECTOMY    . BLADDER SUSPENSION     tack  . CARDIAC CATHETERIZATION Right 05/14/2015    Procedure: Temporary Pacemaker;  Surgeon: Thompson Grayer, MD;  Location: [facility];  Service: Cardiovascular;  Laterality: Right;  . CHOLECYSTECTOMY    . COLON SURGERY     12inches colectomy s/p diverticulitis  . COLONOSCOPY W/ BIOPSIES AND POLYPECTOMY    . EP IMPLANTABLE DEVICE N/A 05/14/2015   Procedure: Pacemaker Implant;  Surgeon: Thompson Grayer, MD; [place] 1 (serial number [id number])     . IR FL GUIDED LOC OF NEEDLE/CATH TIP FOR SPINAL INJECTION LT  09/06/2017  . KNEE ARTHROSCOPY Left 03/29/2017   Procedure: ARTHROSCOPY LEFT KNEE WITH PARTIAL MEDIAL AND PARTIAL LATERAL MENISCECTOMY;  Surgeon: Vickey Huger, MD;  Location: [facility];  Service: Orthopedics;  Laterality: Left;  [place] Kitchen MULTIPLE TOOTH EXTRACTIONS    . TONSILLECTOMY    . TOTAL ABDOMINAL HYSTERECTOMY    . TOTAL KNEE ARTHROPLASTY  12/12/2012   Procedure: TOTAL KNEE ARTHROPLASTY;  Surgeon: Alta Corning, MD;  Location: [facility];  Service: Orthopedics;  Laterality: Right;  right total knee arthroplasty    OB History    No data available       Home Medications    Prior to Admission medications   Medication Sig Start Date End Date Taking? Authorizing Provider  aspirin EC 81 MG tablet Take 1 tablet (81 mg total) by mouth daily. 05/14/17  Yes Deboraha Sprang, MD  vitamin B-12 (CYANOCOBALAMIN) 1000 MCG tablet Take 1,000 mcg by mouth daily.   Yes [provider]  amLODipine (NORVASC) 2.5 MG tablet Take 1 tablet (2.5 mg total) by mouth daily. Patient not taking: Reported on 11/25/2017 09/05/17   Valinda Party, DO    Family History Family History  Problem Relation Age of Onset  . Heart disease Father   . Heart attack Mother   . Stomach cancer Brother   . Other Son        Triple Bypass    Social History Social History   Tobacco Use  . Smoking status: Never Smoker  . Smokeless tobacco: Never Used  Substance Use Topics  . Alcohol use: No  . Drug use: No     Allergies   Crestor  [rosuvastatin]; Darvon; Meloxicam; Meperidine; Prednisone; Valsartan; Amiodarone; Aspirin-acetaminophen-caffeine; Nitroglycerin; and Tramadol   Review of Systems Review of Systems  All other systems reviewed and are negative.    Physical Exam Updated Vital Signs BP (!) 153/71   Pulse 75   Temp 98 F (36.7 C) (Oral)   Resp (!) 22   Ht _0  (1.676 m)   Wt 62.1 kg (137 lb)   SpO2 96%   BMI 22.11 kg/m   Physical Exam  Constitutional: She appears well-developed and well-nourished. No distress.  HENT:  Head: Normocephalic and atraumatic.  Mouth/Throat: Oropharynx is clear and moist. No oropharyngeal exudate.  Eyes: Conjunctivae and EOM are normal. Pupils are equal, round, and reactive to light. Right eye exhibits no discharge. Left eye exhibits no discharge. No scleral icterus.  Neck: Normal range of motion. Neck supple. No JVD present. No thyromegaly present.  Cardiovascular: Normal rate, regular rhythm, normal heart sounds and intact distal pulses. Exam reveals no gallop and no friction rub.  No murmur heard. Pacemaker palpated in the left upper chest wall  Pulmonary/Chest: Effort normal and breath sounds normal. No respiratory distress. She has no wheezes. She has no rales.  Abdominal: Soft. Bowel sounds are normal. She exhibits no distension and no mass. There is no tenderness.  Musculoskeletal: Normal range of motion. She exhibits no edema or tenderness.  Lymphadenopathy:    She has no cervical adenopathy.  Neurological: She is alert. Coordination normal.  The patient has equal grips, no facial droop, normal speech, normal coordination, slight left-sided weakness of the leg compared to the right side.  She states this is baseline  Skin: Skin is warm and dry. No rash noted. No erythema.  Psychiatric: She has a normal mood and affect. Her behavior is normal.  Nursing note and vitals reviewed.    ED Treatments / Results  Labs (all labs ordered are listed, but only abnormal  results are displayed) Labs Reviewed  COMPREHENSIVE METABOLIC PANEL - Abnormal; Notable for the following components:      Result Value   Creatinine, Ser 1.12 (*)    Total Protein 5.9 (*)    ALT 11 (*)    GFR calc non Af Amer 42 (*)    GFR calc Af Amer 48 (*)    All other components within normal limits  URINALYSIS, ROUTINE W REFLEX MICROSCOPIC - Abnormal; Notable for the following components:   Hgb urine dipstick SMALL (*)    Leukocytes, UA LARGE (*)    Bacteria, UA FEW (*)    Squamous Epithelial / LPF 0-5 (*)    Non Squamous Epithelial 0-5 (*)    All other components within normal limits  URINE CULTURE  TSH  CBC WITH DIFFERENTIAL/PLATELET  TROPONIN I    EKG  EKG Interpretation None       Radiology Dg Chest 2 View  Result Date: 11/25/2017 CLINICAL DATA:  Mid sternal chest pain EXAM: CHEST  2 VIEW COMPARISON:  06/27/2017 FINDINGS: Mild hyperinflation with minimal scarring at the bases. No focal consolidation or effusion. Mild cardiomegaly with aortic atherosclerosis. No pneumothorax. IMPRESSION: No active cardiopulmonary disease.  Mild cardiomegaly Electronically Signed   By: Donavan Foil M.D.   On: 11/25/2017 16:48    Procedures Procedures (including critical care time)  Medications Ordered in ED Medications  0.9 %  sodium chloride infusion (125 mL/hr Intravenous New Bag/Given 11/25/17 1707)     Initial Impression / Assessment and Plan / ED Course  I have reviewed the triage vital signs and the nursing notes.  Pertinent labs & imaging results that were available during my care of the patient were reviewed by me and considered in my medical decision making (see chart for details).     Need to interrogate pacer, further workup to further characterize the etiology of the weakness though I suspect she will need to be admitted given her advanced age and severe generalized weakness.  UTI present on urinalysis, labs otherwise unremarkable, TSH normal, discussed with  hospitalist who will admit for generalized weakness, likely needs echocardiogram and patient.  Final Clinical Impressions(s) / ED Diagnoses   Final diagnoses:  Weakness  Urinary tract infection without hematuria, site unspecified    ED Discharge Orders    None       Noemi Chapel, MD 11/25/17 [id number]

## 2017-11-26 ENCOUNTER — Observation Stay (HOSPITAL_COMMUNITY): Payer: Medicare HMO

## 2017-11-26 ENCOUNTER — Observation Stay (HOSPITAL_BASED_OUTPATIENT_CLINIC_OR_DEPARTMENT_OTHER): Payer: Medicare HMO

## 2017-11-26 ENCOUNTER — Observation Stay (HOSPITAL_BASED_OUTPATIENT_CLINIC_OR_DEPARTMENT_OTHER)
Admit: 2017-11-26 | Discharge: 2017-11-26 | Disposition: A | Discharge status: Home / Self Care | Payer: Medicare HMO | Attending: Internal Medicine | Admitting: Internal Medicine

## 2017-11-26 DIAGNOSIS — I341 Nonrheumatic mitral (valve) prolapse: Secondary | ICD-10-CM | POA: Diagnosis not present

## 2017-11-26 DIAGNOSIS — R609 Edema, unspecified: Secondary | ICD-10-CM

## 2017-11-26 DIAGNOSIS — R531 Weakness: Secondary | ICD-10-CM

## 2017-11-26 LAB — CBC
HEMATOCRIT: 37.9 % (ref 36.0–46.0)
HEMATOCRIT: 39.4 % (ref 36.0–46.0)
Hemoglobin: 12.3 g/dL (ref 12.0–15.0)
Hemoglobin: 12.6 g/dL (ref 12.0–15.0)
MCH: 30.4 pg (ref 26.0–34.0)
MCH: 29.9 pg (ref 26.0–34.0)
MCHC: 32.5 g/dL (ref 30.0–36.0)
MCHC: 32 g/dL (ref 30.0–36.0)
MCV: 93.6 fL (ref 78.0–100.0)
MCV: 93.4 fL (ref 78.0–100.0)
PLATELETS: 159 10*3/uL (ref 150–400)
Platelets: 157 10*3/uL (ref 150–400)
RBC: 4.05 MIL/uL (ref 3.87–5.11)
RBC: 4.22 MIL/uL (ref 3.87–5.11)
RDW: 12.8 % (ref 11.5–15.5)
RDW: 13 % (ref 11.5–15.5)
WBC: 5.1 10*3/uL (ref 4.0–10.5)
WBC: 5 10*3/uL (ref 4.0–10.5)

## 2017-11-26 LAB — CREATININE, SERUM
Creatinine, Ser: 1.08 mg/dL — ABNORMAL HIGH (ref 0.44–1.00)
GFR calc Af Amer: 50 mL/min — ABNORMAL LOW (ref >60)
GFR, EST NON AFRICAN AMERICAN: 44 mL/min — AB (ref >60)

## 2017-11-26 LAB — BASIC METABOLIC PANEL
Anion gap: 8 (ref 5–15)
BUN: 13 mg/dL (ref 6–20)
CHLORIDE: 111 mmol/L (ref 101–111)
CO2: 21 mmol/L — AB (ref 22–32)
CREATININE: 0.99 mg/dL (ref 0.44–1.00)
Calcium: 8.8 mg/dL — ABNORMAL LOW (ref 8.9–10.3)
GFR calc non Af Amer: 48 mL/min — ABNORMAL LOW (ref >60)
GFR, EST AFRICAN AMERICAN: 56 mL/min — AB (ref >60)
Glucose, Bld: 88 mg/dL (ref 65–99)
Potassium: 4 mmol/L (ref 3.5–5.1)
Sodium: 140 mmol/L (ref 135–145)

## 2017-11-26 LAB — TROPONIN I
Troponin I: 0.03 ng/mL (ref <0.03)
Troponin I: <0.03 ng/mL (ref <0.03)

## 2017-11-26 LAB — D-DIMER, QUANTITATIVE (NOT AT ARMC): D DIMER QUANT: 1.16 ug{FEU}/mL — AB (ref 0.00–0.50)

## 2017-11-26 LAB — ECHOCARDIOGRAM COMPLETE
HEIGHTINCHES: 66 in
WEIGHTICAEL: 2192 [oz_av]

## 2017-11-26 LAB — TSH: TSH: 1.298 u[IU]/mL (ref 0.350–4.500)

## 2017-11-26 LAB — CK
Total CK: 55 U/L (ref 38–234)
Total CK: 61 U/L (ref 38–234)

## 2017-11-26 LAB — C-REACTIVE PROTEIN: CRP: <0.8 mg/dL (ref <1.0)

## 2017-11-26 LAB — SEDIMENTATION RATE: Sed Rate: 7 mm/hr (ref 0–22)

## 2017-11-26 LAB — T4, FREE: Free T4: 0.99 ng/dL (ref 0.61–1.12)

## 2017-11-26 MED ORDER — RIVAROXABAN 15 MG PO TABS
15.0000 mg | ORAL_TABLET | Freq: Two times a day (BID) | ORAL | Status: DC
  Administered 2017-11-26 – 2017-11-27 (×2): 15 mg via ORAL
  Filled 2017-11-26 (×2): qty 1

## 2017-11-26 MED ORDER — RIVAROXABAN 20 MG PO TABS: 20.0000 mg | ORAL_TABLET | Freq: Every day | ORAL | Status: DC

## 2017-11-26 MED ORDER — GABAPENTIN 100 MG PO CAPS
100.0000 mg | ORAL_CAPSULE | Freq: Every day | ORAL | Status: DC
  Administered 2017-11-26: 100 mg via ORAL
  Filled 2017-11-26: qty 1

## 2017-11-26 MED ORDER — DEXTROSE 5 % IV SOLN
1.0000 g | INTRAVENOUS | Status: DC
  Administered 2017-11-26 – 2017-11-27 (×2): 1 g via INTRAVENOUS
  Filled 2017-11-26 (×2): qty 10

## 2017-11-26 MED ORDER — IOPAMIDOL (ISOVUE-370) INJECTION 76%
INTRAVENOUS | Status: AC
  Administered 2017-11-26: 100 mL
  Filled 2017-11-26: qty 100

## 2017-11-26 MED ORDER — AZITHROMYCIN 250 MG PO TABS
500.0000 mg | ORAL_TABLET | Freq: Every day | ORAL | Status: DC
  Administered 2017-11-26 – 2017-11-27 (×2): 500 mg via ORAL
  Filled 2017-11-26 (×2): qty 2

## 2017-11-26 NOTE — Progress Notes (Signed)
ANTICOAGULATION CONSULT NOTE - Initial Consult  Pharmacy Consult for Xarelto Indication: DVT  Allergies  Allergen Reactions  . Crestor [Rosuvastatin] Other (See Comments)    Myalgia  . Darvon Other (See Comments)    Drop in  BP  . Meloxicam Other (See Comments)    Dizziness   . Meperidine Other (See Comments)    Hypotension  . Prednisone Diarrhea    POLYURIA  . Valsartan Other (See Comments)     Hypotension  . Amiodarone Other (See Comments)    Did not tolerate - unknown reaction  . Aspirin-Acetaminophen-Caffeine Other (See Comments)    Unknown reaction  . Nitroglycerin     Profound hypotension with sublingual nitroglycerin.   . Tramadol Other (See Comments)    Unknown reaction    Patient Measurements: Height: 5' 6"  (167.6 cm) Weight: 137 lb (62.1 kg) IBW/kg (Calculated) : 59.3  Vital Signs: Temp: 97.8 F (36.6 C) (01/18 1302) Temp Source: Oral (01/18 1302) BP: 184/73 (01/18 1302) Pulse Rate: 69 (01/18 1302)  Labs: Recent Labs    11/25/17 1613 11/26/17 0105 11/26/17 0356 11/26/17 1023  HGB 12.5 12.3 12.6  --   HCT 38.6 37.9 39.4  --   PLT 172 157 159  --   CREATININE 1.12* 1.08* 0.99  --   CKTOTAL  --   --   --  55  TROPONINI <0.03 0.03* <0.03 <0.03    Estimated Creatinine Clearance: 34.6 mL/min (by C-G formula based on SCr of 0.99 mg/dL).   Medical History: Past Medical History:  Diagnosis Date  . Arthritis   . Bronchiectasis, non-tuberculous ([place])    a. Followed by Dr. Joya Gaskins.  . Colon polyps   . Complete heart block (HCC)    a. s/p MDT dual chamber PPM   . COPD (chronic obstructive pulmonary disease) ([place])    past hx with pneumonia  . Diverticulosis   . DVT (deep venous thrombosis) (HCC)    Resolved, no anticoagulation currently  . GERD (gastroesophageal reflux disease)   . Headache   . High cholesterol   . Hypertension   . IBS (irritable bowel syndrome)   . 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 MI (myocardial  infarction) ?? probably not 1975   a. 1975 Reported MI;  b. 2011 Cath: nonobs dzs;  c. 2013 Nonischemic Myoview;  d. 12/2012 Echo: EF 60-65%, Gr1 DD.  . Nephrolithiasis   . Palpitation    a. 2010 Event Monitor: PACs and PVCs  . Pneumonia   . PONV (postoperative nausea and vomiting)   . Post-dural puncture headache 09/01/2017  . Presence of permanent cardiac pacemaker   . Pulmonary fibrosis ([place])   . Stroke ([place])   . Wears dentures    full  . Wide-complex tachycardia ([place])    a. Followed by Dr. Caryl Comes, "I think this is VT, but i am not sure QRSd 135 1:1 AV."   Assessment: 89yof with new RLE DVT to begin xarelto. Was on prophylactic lovenox but has not received a dose today.  Goal of Therapy:  Monitor platelets by anticoagulation protocol: Yes   Plan:  1) Xarelto 27m po bid x 21 days then 230mpo daily 2) Case management consult ordered 3) Will provide education  MaDeboraha Sprang/18/2019,3:45 PM

## 2017-11-26 NOTE — ED Notes (Signed)
Meal Tray ordered at 11:27 am 

## 2017-11-26 NOTE — Progress Notes (Signed)
*  PRELIMINARY RESULTS* Vascular Ultrasound Bilateral lower extremity venous duplex has been completed.  Preliminary findings: Small focal area of deep vein thrombosis noted in the mid right peroneal vein, other visualized veins bilaterally appear negative for thrombus.  Negative for bakers cysts bilaterally.  Preliminary results called to ED nurse, Jill Alexanders @ 12:15. Chauncey Fischer 11/26/2017, 12:15 PM

## 2017-11-26 NOTE — Progress Notes (Signed)
Pharmacy Antibiotic Note  [name] [name] is a 84 y.o. female admitted on 11/25/2017 with UTI.  Pharmacy has been consulted for Ceftriaxone dosing.WBC WNL. Mildly abnormal U/A.   Plan: Ceftriaxone 1g IV q24h F/U urine culture for directed therapy  Height: 5\' 6"  (167.6 cm) Weight: 137 lb (62.1 kg) IBW/kg (Calculated) : 59.3  Temp (24hrs), Avg:98 F (36.7 C), Min:98 F (36.7 C), Max:98 F (36.7 C)  Recent Labs  Lab 11/25/17 1613 11/26/17 0105 11/26/17 0356  WBC 5.2 5.1 5.0  CREATININE 1.12* 1.08* 0.99    Estimated Creatinine Clearance: 34.6 mL/min (by C-G formula based on SCr of 0.99 mg/dL).    Allergies  Allergen Reactions  . Crestor [Rosuvastatin] Other (See Comments)    Myalgia  . Darvon Other (See Comments)    Drop in  BP  . Meloxicam Other (See Comments)    Dizziness   . Meperidine Other (See Comments)    Hypotension  . Prednisone Diarrhea    POLYURIA  . Valsartan Other (See Comments)     Hypotension  . Amiodarone Other (See Comments)    Did not tolerate - unknown reaction  . Aspirin-Acetaminophen-Caffeine Other (See Comments)    Unknown reaction  . Nitroglycerin     Profound hypotension with sublingual nitroglycerin.   . Tramadol Other (See Comments)    Unknown reaction   Abran Duke 11/26/2017 7:06 AM

## 2017-11-26 NOTE — Progress Notes (Signed)
  Echocardiogram 2D Echocardiogram has been performed.  [name] [name] 11/26/2017, 1:23 PM

## 2017-11-26 NOTE — Progress Notes (Signed)
[facility] Progress Note  Patient: [name] [name] KYH:[id number]   PCP: Algis Greenhouse, MD DOB: Apr 07, 1924   DOA: 11/25/2017   DOS: 11/26/2017   Date of Service: the patient was seen and examined on 11/26/2017  Subjective: Continues to have weakness although from the chart review this weakness actually ongoing for more than a year patient has seen by cardiology Dr., PCP as well as neurology for the same. Complains about having pain in her left leg progressively worsening. No chest pain no cough no shortness of breath no fever no chills no burning urination.  Brief hospital course: Pt. with PMH of CHB S/P PPM, CVA, HTN, COPD, A. fib not on any anticoagulation; admitted on 11/25/2017, presented with complaint of generalized weakness, was found to have possible UTI, and deconditioning. Currently further plan is close monitoring.  Assessment and Plan: 1.  UTI. Started on IV ceftriaxone. Urine shows pyuria.  Culture pending. Patient denies any symptoms of fever or burning urination although complains about having progressively worsening generalized weakness.  2.  Suspected community-acquired pneumonia. CT scan shows evidence of multifocal diffuse patchy airspace disease concerning for pneumonia. Azithromycin added to ceftriaxone regimen.  3.  Right peroneal DVT. Prior history of right leg DVT in 2014. Presents with complaints of generalized weakness with immobility ongoing for last few months. With lower axillary Doppler was performed due to patient's complaint of pain as well as swelling in the leg and elevated d-dimer. Left leg is negative for DVT, right leg shows peroneal DVT. With her history of prior DVT as well as possible immobility going future recommend anticoagulation. Patient was on Xarelto in the past pharmacy consulted for management.  4. Atrial fibrillation CHA2DS2-VASc Score 6 Patient is currently in A. fib at controlled rate. We'll start the patient on  anticoagulation. Not on any rate control medication right now.  5.  History of nonischemic cardiomyopathy. Echocardiogram shows preserved EF. Continue to monitor for now.  6.  Chronic fatigue syndrome. Patient has chronic fatigue syndrome with generalized weakness. TSH free T4 CK level ESR CRP all workup unremarkable. Outpatient follow-up as well as PT.  7.  Left leg pain. Appears to be neuropathic pain rather than any acute abnormality. Recommend to start taking gabapentin nightly. Has tried lumbar spinal injections in the past with complication.  Diet: cardiac diet DVT Prophylaxis: mechanical compression device  Advance goals of care discussion: DNR DNI  Family Communication: family was present at bedside, at the time of interview. The pt provided permission to discuss medical plan with the family. Opportunity was given to ask question and all questions were answered satisfactorily.   Disposition:  Discharge to home.  Consultants: none Procedures: none  Antibiotics: Anti-infectives (From admission, onward)   Start     Dose/Rate Route Frequency Ordered Stop   11/26/17 1115  azithromycin (ZITHROMAX) tablet 500 mg     500 mg Oral Daily 11/26/17 1101     11/26/17 0715  cefTRIAXone (ROCEPHIN) 1 g in dextrose 5 % 50 mL IVPB     1 g 100 mL/hr over 30 Minutes Intravenous Every 24 hours 11/26/17 0710         Objective: Physical Exam: Vitals:   11/26/17 1302 11/26/17 1400 11/26/17 1500 11/26/17 1600  BP: (!) 184/73 (!) 166/72 (!) 146/69 (!) 164/77  Pulse: 69 69 65 64  Resp: (!) '23 19 19 19  '$ Temp: 97.8 F (36.6 C)     TempSrc: Oral     SpO2: 94% 94% 93% 92%  Weight:      Height:        Intake/Output Summary (Last 24 hours) at 11/26/2017 1850 Last data filed at 11/26/2017 1600 Gross per 24 hour  Intake 50 ml  Output -  Net 50 ml   Filed Weights   11/25/17 1559  Weight: 62.1 kg (137 lb)   General: Alert, Awake and Oriented to Time, Place and Person. Appear in no  distress, affect appropriate Eyes: PERRL, Conjunctiva normal ENT: Oral Mucosa clear moist. Neck: no JVD, no Abnormal Mass Or lumps Cardiovascular: S1 and S2 Present, no Murmur, Peripheral Pulses Present Respiratory: normal respiratory effort, Bilateral Air entry equal and Decreased, no use of accessory muscle, Clear to Auscultation, no Crackles, no wheezes Abdomen: Bowel Sound present, Soft and no tenderness, no hernia Skin: no redness, nono Rash,no induration Extremities: bilateral Pedal edema, no calf tenderness Neurologic: Grossly no focal neuro deficit. Bilaterally Equal motor strength  Data Reviewed: CBC: Recent Labs  Lab 11/25/17 1613 11/26/17 0105 11/26/17 0356  WBC 5.2 5.1 5.0  NEUTROABS 3.2  --   --   HGB 12.5 12.3 12.6  HCT 38.6 37.9 39.4  MCV 93.7 93.6 93.4  PLT 172 157 250   Basic Metabolic Panel: Recent Labs  Lab 11/25/17 1613 11/26/17 0105 11/26/17 0356  NA 141  --  140  K 3.7  --  4.0  CL 108  --  111  CO2 24  --  21*  GLUCOSE 96  --  88  BUN 14  --  13  CREATININE 1.12* 1.08* 0.99  CALCIUM 9.1  --  8.8*    Liver Function Tests: Recent Labs  Lab 11/25/17 1613  AST 20  ALT 11*  ALKPHOS 43  BILITOT 0.6  PROT 5.9*  ALBUMIN 3.6   No results for input(s): LIPASE, AMYLASE in the last 168 hours. No results for input(s): AMMONIA in the last 168 hours. Coagulation Profile: No results for input(s): INR, PROTIME in the last 168 hours. Cardiac Enzymes: Recent Labs  Lab 11/25/17 1613 11/26/17 0105 11/26/17 0356 11/26/17 1023 11/26/17 1322  CKTOTAL  --   --   --  67 61  TROPONINI <0.03 0.03* <0.03 <0.03  --    BNP (last 3 results) No results for input(s): PROBNP in the last 8760 hours. CBG: No results for input(s): GLUCAP in the last 168 hours. Studies: Ct Angio Chest Pe W Or Wo Contrast  Result Date: 11/26/2017 CLINICAL DATA:  Shortness of breath, elevated D-dimer EXAM: CT ANGIOGRAPHY CHEST WITH CONTRAST TECHNIQUE: Multidetector CT imaging  of the chest was performed using the standard protocol during bolus administration of intravenous contrast. Multiplanar CT image reconstructions and MIPs were obtained to evaluate the vascular anatomy. CONTRAST:  145m ISOVUE-370 IOPAMIDOL (ISOVUE-370) INJECTION 76% COMPARISON:  None. 09/02/2017 and previous FINDINGS: Cardiovascular: Left subclavian transvenous pacemaker. Heart size normal. Dilated central pulmonary arteries suggesting pulmonary hypertension. Satisfactory opacification of pulmonary arteries noted, and there is no evidence of pulmonary emboli. Moderate coronary calcifications. Incomplete opacification of the thoracic aorta. No aneurysm. Moderate calcified plaque in the arch and descending thoracic segment. Classic 3 vessel brachiocephalic arterial origin anatomy. No suggestion of dissection or intramural hematoma. No pericardial effusion. Mediastinum/Nodes: No hilar or mediastinal adenopathy. 1.2 cm left thyroid nodule. Lungs/Pleura: Chronic consolidation and bronchiectasis in the inferior lingula and anteromedial right middle lobe. Clustered nodular airspace opacities measuring up to 0.5 cm in the posterior right upper lobe, more conspicuous than on prior study. Stable nodular changes in the superior  segment left lower lobe. Musculoskeletal: Negative for fracture or worrisome bone lesion. Upper Abdomen: Cholecystectomy clips.  No acute findings. Review of the MIP images confirms the above findings IMPRESSION: 1. Negative for acute PE or evidence of thoracic aortic dissection. 2. Progressive clustered nodular airspace opacities in the posterior right upper lobe, possibly infectious/inflammatory etiology but nonspecific. 3. Dilated central pulmonary arteries suggesting pulmonary arterial hypertension. 4. Coronary and Aortic Atherosclerosis (ICD10-170.0) Electronically Signed   By: Lucrezia Europe M.D.   On: 11/26/2017 09:36    Scheduled Meds: . aspirin EC  81 mg Oral Daily  . azithromycin  500 mg Oral  Daily  . rivaroxaban  15 mg Oral BID  . [START ON 12/18/2017] rivaroxaban  20 mg Oral Q supper  . vitamin B-12  1,000 mcg Oral Daily   Continuous Infusions: . cefTRIAXone (ROCEPHIN)  IV Stopped (11/26/17 1050)   PRN Meds: acetaminophen **OR** acetaminophen, ondansetron **OR** ondansetron (ZOFRAN) IV  Time spent: 35 minutes  Author: Berle Mull, MD [facility] Pager: [phone] 11/26/2017 6:50 PM  If 7PM-7AM, please contact night-coverage at [email or web address], password [facility]

## 2017-11-26 NOTE — ED Notes (Signed)
Patient transported to CT 

## 2017-11-26 NOTE — ED Notes (Signed)
Patient transported to Ultrasound 

## 2017-11-26 NOTE — Progress Notes (Signed)
PT Cancellation Note  Patient Details Name: [name] [name] MRN: [id number] DOB: Aug 25, 1924   Cancelled Treatment:    Reason Eval/Treat Not Completed: Medical issues which prohibited therapy.  Pt unable to get up yet due to anticoagulants not yet given for LE DVT.  Will see as able 11/19. 11/26/2017  [place] Bing, PT [phone]  (pager)   Eliseo Gum Adrine Hayworth 11/26/2017, 5:44 PM

## 2017-11-27 ENCOUNTER — Other Ambulatory Visit: Payer: Self-pay

## 2017-11-27 DIAGNOSIS — R531 Weakness: Secondary | ICD-10-CM | POA: Diagnosis not present

## 2017-11-27 MED ORDER — AZITHROMYCIN 500 MG PO TABS: 500.0000 mg | ORAL_TABLET | Freq: Every day | ORAL | 0 refills | Status: AC

## 2017-11-27 MED ORDER — GABAPENTIN 100 MG PO CAPS: 100.0000 mg | ORAL_CAPSULE | Freq: Every day | ORAL | 0 refills | Status: DC

## 2017-11-27 MED ORDER — RIVAROXABAN (XARELTO) VTE STARTER PACK (15 & 20 MG): ORAL_TABLET | ORAL | 0 refills | Status: DC

## 2017-11-27 MED ORDER — CEPHALEXIN 500 MG PO CAPS: 500.0000 mg | ORAL_CAPSULE | Freq: Two times a day (BID) | ORAL | 0 refills | Status: AC

## 2017-11-27 NOTE — Evaluation (Signed)
Physical Therapy Evaluation Patient Details Name: [name] [name] MRN: [id number] DOB: [birth year] Today's Date: 11/27/2017   History of Present Illness  [name] [name] is a 84 y.o. female with complete heart block status post pacemaker placement, history of stroke, hypertension, COPD presents to the ER with complaints of having progressive weakness and has been having chest pain.  Work up suggestive of PNA, UTI, DVT and afib present, but rate controlled.  Clinical Impression  Pt admitted with/for above complications.  Pt not yet at baseline, needing min guard to min assist.  Pt currently limited functionally due to the problems listed below.  (see problems list.)  Pt will benefit from PT to maximize function and safety to be able to get home safely with available assist.     Follow Up Recommendations Home health PT;Supervision - Intermittent;Supervision for mobility/OOB    Equipment Recommendations  None recommended by PT    Recommendations for Other Services       Precautions / Restrictions Precautions Precautions: Fall Restrictions Weight Bearing Restrictions: No      Mobility  Bed Mobility Overal bed mobility: Modified Independent             General bed mobility comments: slow and mildly effortful, but no use of rails or assist  Transfers Overall transfer level: Needs assistance   Transfers: Sit to/from Stand Sit to Stand: Min guard         General transfer comment: needs work on hand placement for safety, but did not need assist  Ambulation/Gait Ambulation/Gait assistance: Min assist;Min guard Ambulation Distance (Feet): 100 Feet Assistive device: Straight cane Gait Pattern/deviations: Step-through pattern   Gait velocity interpretation: Below normal speed for age/gender General Gait Details: mildly unsteady overall, fair use of the cane, but still very tentative with short wider BOS steps.  Stairs            Wheelchair Mobility    Modified  [place] (Stroke Patients Only)       Balance Overall balance assessment: Needs assistance   Sitting balance-Leahy Scale: Good       Standing balance-Leahy Scale: Fair                               Pertinent Vitals/Pain Pain Assessment: Faces Faces Pain Scale: Hurts a little bit Pain Location: back Pain Descriptors / Indicators: Aching;Grimacing Pain Intervention(s): Monitored during session    Home Living Family/patient expects to be discharged to:: Private residence Living Arrangements: Spouse/significant other Available Help at Discharge: Family;Available 24 hours/day Type of Home: House Home Access: Level entry     Home Layout: Two level;Able to live on main level with bedroom/bathroom Home Equipment: Dan Humphreys - 2 wheels;Walker - 4 wheels;Bedside commode;Cane - single point;Shower seat - built in Additional Comments: 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    Prior Function Level of Independence: Independent with assistive device(s)         Comments: independent for household ambulation; rollator for community distances. Cooks, cleans. Does OPPT 3 times per week and goes to the gym everyday     Hand Dominance   Dominant Hand: Right    Extremity/Trunk Assessment   Upper Extremity Assessment Upper Extremity Assessment: Overall WFL for tasks assessed    Lower Extremity Assessment Lower Extremity Assessment: RLE deficits/detail;LLE deficits/detail RLE Deficits / Details: grossly 4/5 LLE Deficits / Details: grossly 4-,  arthritic L Knee hindering strength and functional range.  Communication   Communication: No difficulties  Cognition Arousal/Alertness: Awake/alert Behavior During Therapy: WFL for tasks assessed/performed Overall Cognitive Status: Within Functional Limits for tasks assessed                                        General Comments General comments (skin integrity, edema, etc.): SpO2 during gait on RA steady  at 94% and EHR in the 100's  Pt visibly fatigued.    Exercises     Assessment/Plan    PT Assessment Patient needs continued PT services  PT Problem List Decreased strength;Decreased activity tolerance;Decreased balance;Decreased mobility;Decreased knowledge of use of DME       PT Treatment Interventions DME instruction;Gait training;Functional mobility training;Stair training;Therapeutic activities;Balance training;Patient/family education    PT Goals (Current goals can be found in the Care Plan section)  Acute Rehab PT Goals Patient Stated Goal: get back independent and confident PT Goal Formulation: With patient Time For Goal Achievement: 12/11/17 Potential to Achieve Goals: Good    Frequency Min 3X/week   Barriers to discharge        Co-evaluation               AM-PAC PT "6 Clicks" Daily Activity  Outcome Measure Difficulty turning over in bed (including adjusting bedclothes, sheets and blankets)?: A Little Difficulty moving from lying on back to sitting on the side of the bed? : A Little Difficulty sitting down on and standing up from a chair with arms (e.g., wheelchair, bedside commode, etc,.)?: A Little Help needed moving to and from a bed to chair (including a wheelchair)?: A Little Help needed walking in hospital room?: A Little Help needed climbing 3-5 steps with a railing? : A Little 6 Click Score: 18    End of Session   Activity Tolerance: Patient tolerated treatment well;Patient limited by fatigue Patient left: in chair;with call bell/phone within reach;with chair alarm set Nurse Communication: Mobility status PT Visit Diagnosis: Unsteadiness on feet (R26.81);Muscle weakness (generalized) (M62.81)    Time: 1000-1030 PT Time Calculation (min) (ACUTE ONLY): 30 min   Charges:   PT Evaluation $PT Eval Moderate Complexity: 1 Mod PT Treatments $Gait Training: 8-22 mins   PT G Codes:        2017-12-11  [place] Bing,  PT [phone]  (pager)  Eliseo Gum Silvino Selman 12/11/17, 10:48 AM

## 2017-11-27 NOTE — Discharge Instructions (Signed)
Information on my medicine - XARELTO (rivaroxaban)  This medication education was reviewed with me or my healthcare representative as part of my discharge preparation.   WHY WAS XARELTO PRESCRIBED FOR YOU? Xarelto was prescribed to treat blood clots that may have been found in the veins of your legs (deep vein thrombosis) or in your lungs (pulmonary embolism) and to reduce the risk of them occurring again.  What do you need to know about Xarelto? The starting dose is one 15 mg tablet taken TWICE daily with food for the FIRST 21 DAYS then on 12/18/17 the dose is changed to one 20 mg tablet taken ONCE A DAY with your evening meal.  DO NOT stop taking Xarelto without talking to the health care provider who prescribed the medication.  Refill your prescription for 20 mg tablets before you run out.  After discharge, you should have regular check-up appointments with your healthcare provider that is prescribing your Xarelto.  In the future your dose may need to be changed if your kidney function changes by a significant amount.  What do you do if you miss a dose? If you are taking Xarelto TWICE DAILY and you miss a dose, take it as soon as you remember. You may take two 15 mg tablets (total 30 mg) at the same time then resume your regularly scheduled 15 mg twice daily the next day.  If you are taking Xarelto ONCE DAILY and you miss a dose, take it as soon as you remember on the same day then continue your regularly scheduled once daily regimen the next day. Do not take two doses of Xarelto at the same time.   Important Safety Information Xarelto is a blood thinner medicine that can cause bleeding. You should call your healthcare provider right away if you experience any of the following: ? Bleeding from an injury or your nose that does not stop. ? Unusual colored urine (red or dark brown) or unusual colored stools (red or black). ? Unusual bruising for unknown reasons. ? A serious fall or if  you hit your head (even if there is no bleeding).  Some medicines may interact with Xarelto and might increase your risk of bleeding while on Xarelto. To help avoid this, consult your healthcare provider or pharmacist prior to using any new prescription or non-prescription medications, including herbals, vitamins, non-steroidal anti-inflammatory drugs (NSAIDs) and supplements.  This website has more information on Xarelto: VisitDestination.com.br.

## 2017-11-27 NOTE — Care Management Note (Signed)
Case Management Note  Patient Details  Name: [name] [name] MRN: [id number] Date of Birth: September 27, 1924  Subjective/Objective:    UTI, Pneumonia              Action/Plan: CM talked to patient about HHC choices, patient refused HHPT; she stated that she goes to an outpatient facility a few days a week and would like to continue doing that. No needs identified at this time.  Expected Discharge Date:  11/27/17               Expected Discharge Plan:  Home w Home Health Services  Discharge planning Services  CM Consult  Choice offered to:  Patient  [facility] Arranged:  Patient Refused [facility]  Status of Service:  Completed, signed off  Reola Mosher [phone] 11/27/2017, 11:48 AM

## 2017-11-27 NOTE — Progress Notes (Signed)
Patient given discharge instructions with husband and son at bedside. Patient educated on new medication (xarelto) and antibiotics. Provided handout, too. Patient verbalized understanding. Peripheral IV removed. No further questions.

## 2017-11-28 LAB — URINE CULTURE

## 2017-11-29 NOTE — Discharge Summary (Signed)
[facility] Discharge Summary   Patient: [name] [name] PFX:[id number]   PCP: Algis Greenhouse, MD DOB: 03/12/1924   Date of admission: 11/25/2017   Date of discharge: 11/27/2017   Discharge Diagnoses:  Principal Problem:   Weakness generalized Active Problems:   Hypertension   Chest pain with low risk for cardiac etiology   Admitted From: home Disposition:  home  Recommendations for Outpatient Follow-up:  1. Please follow-up with PCP in 1-2 weeks.  Follow-up Information    Dough, Jaymes Graff, MD. Schedule an appointment as soon as possible for a visit in 1 week(s).   Specialty:  Family Medicine Contact information: [place] [address] [phone]          Diet recommendation: Cardiac diet  Activity: The patient is advised to gradually reintroduce usual activities.  Discharge Condition: good  Code Status: DNR DNI  History of present illness: As per the H and P dictated on admission, " [name] [name] is a 84 y.o. female with complete heart block status post pacemaker placement, history of stroke, hypertension, COPD presents to the ER with complaints of having progressive weakness and has been having chest pain.  Patient states patient has been having these symptoms for last 2 months which has been progressive worsening.  Denies any dizziness or loss of consciousness.  Chest pain happens off and on every day even at rest.  Pain is mostly retrosternal nonradiating pressure-like.  Patient had recently followed up with her cardiologist at [facility].  Patient was noticed to have atrial tachycardia and was started on Cardizem CD.  Patient is at a started.   ED Course: In the ER patient is presently chest pain-free.  EKG shows paced rhythm and chest x-ray unremarkable.  Patient is being admitted for further observation of generalized weakness and chest pain.  As per the cardiology notes from patient's primary cardiologist plan was to have a stress test done as  outpatient."  Hospital Course:  Summary of her active problems in the hospital is as following. 1.  UTI. Started on IV ceftriaxone. Urine shows pyuria.  Culture shows multiple species.  On Keflex. Patient denies any symptoms of fever or burning urination although complains about having progressively worsening generalized weakness.  2.  Suspected community-acquired pneumonia. CT scan shows evidence of multifocal diffuse patchy airspace disease concerning for pneumonia. Azithromycin added to ceftriaxone regimen.  3.  Right peroneal DVT. Prior history of right leg DVT in 2014. Presents with complaints of generalized weakness with immobility ongoing for last few months. With lower axillary Doppler was performed due to patient's complaint of pain as well as swelling in the leg and elevated d-dimer. Left leg is negative for DVT, right leg shows peroneal DVT. With her history of prior DVT as well as possible immobility going future, recommend anticoagulation. Patient was on Xarelto in the past, started on that medication again.  4. Atrial fibrillation CHA2DS2-VASc Score 6 Patient is currently in A. fib at controlled rate. We'll start the patient on anticoagulation. Not on any rate control medication right now.  5.  History of nonischemic cardiomyopathy. Echocardiogram shows preserved EF. Continue to monitor for now.  6.  Chronic fatigue syndrome. Patient has chronic fatigue syndrome with generalized weakness. TSH free T4 CK level ESR CRP all workup unremarkable. Outpatient follow-up as well as PT.  7.  Left leg pain. Appears to be neuropathic pain rather than any acute abnormality. Recommend to start taking gabapentin nightly.  Has tried lumbar spinal injections  in the past with complication.  All other chronic medical condition were stable during the hospitalization.  Patient was seen by physical therapy, who recommended home health, which was arranged by Education officer, museum and  case Freight forwarder. On the day of the discharge the patient's vitals were stable, and no other acute medical condition were reported by patient. the patient was felt safe to be discharge at home with home health.  Procedures and Results:  none   Consultations:  none  DISCHARGE MEDICATION: Allergies as of 11/27/2017      Reactions   Crestor [rosuvastatin] Other (See Comments)   Myalgia   Darvon Other (See Comments)   Drop in  BP   Meloxicam Other (See Comments)   Dizziness    Meperidine Other (See Comments)   Hypotension   Prednisone Diarrhea   POLYURIA   Valsartan Other (See Comments)    Hypotension   Amiodarone Other (See Comments)   Did not tolerate - unknown reaction   Aspirin-acetaminophen-caffeine Other (See Comments)   Unknown reaction   Nitroglycerin    Profound hypotension with sublingual nitroglycerin.    Tramadol Other (See Comments)   Unknown reaction      Medication List    STOP taking these medications   aspirin EC 81 MG tablet     TAKE these medications   amLODipine 2.5 MG tablet Commonly known as:  NORVASC Take 1 tablet (2.5 mg total) by mouth daily.   azithromycin 500 MG tablet Commonly known as:  ZITHROMAX Take 1 tablet (500 mg total) by mouth daily for 3 days.   cephALEXin 500 MG capsule Commonly known as:  KEFLEX Take 1 capsule (500 mg total) by mouth 2 (two) times daily for 4 days.   gabapentin 100 MG capsule Commonly known as:  NEURONTIN Take 1 capsule (100 mg total) by mouth at bedtime.   Rivaroxaban 15 & 20 MG Tbpk Take as directed on package: Start with one 43m tablet by mouth twice a day with food. On Day 22, switch to one 248mtablet once a day with food.   vitamin B-12 1000 MCG tablet Commonly known as:  CYANOCOBALAMIN Take 1,000 mcg by mouth daily.      Allergies  Allergen Reactions  . Crestor [Rosuvastatin] Other (See Comments)    Myalgia  . Darvon Other (See Comments)    Drop in  BP  . Meloxicam Other (See Comments)     Dizziness   . Meperidine Other (See Comments)    Hypotension  . Prednisone Diarrhea    POLYURIA  . Valsartan Other (See Comments)     Hypotension  . Amiodarone Other (See Comments)    Did not tolerate - unknown reaction  . Aspirin-Acetaminophen-Caffeine Other (See Comments)    Unknown reaction  . Nitroglycerin     Profound hypotension with sublingual nitroglycerin.   . Tramadol Other (See Comments)    Unknown reaction   Discharge Instructions    Diet - low sodium heart healthy   Complete by:  As directed    Discharge instructions   Complete by:  As directed    It is important that you read following instructions as well as go over your medication list with RN to help you understand your care after this hospitalization.  Discharge Instructions: Please follow-up with PCP in one week  Please request your primary care physician to go over all Hospital Tests and Procedure/Radiological results at the follow up,  Please get all Hospital records sent to your PCP by  signing hospital release before you go home.   Do not take more than prescribed Pain, Sleep and Anxiety Medications. You were cared for by a hospitalist during your hospital stay. If you have any questions about your discharge medications or the care you received while you were in the hospital after you are discharged, you can call the unit and ask to speak with the hospitalist on call if the hospitalist that took care of you is not available.  Once you are discharged, your primary care physician will handle any further medical issues. Please note that NO REFILLS for any discharge medications will be authorized once you are discharged, as it is imperative that you return to your primary care physician (or establish a relationship with a primary care physician if you do not have one) for your aftercare needs so that they can reassess your need for medications and monitor your lab values. You Must read complete  instructions/literature along with all the possible adverse reactions/side effects for all the Medicines you take and that have been prescribed to you. Take any new Medicines after you have completely understood and accept all the possible adverse reactions/side effects. Wear Seat belts while driving.   Increase activity slowly   Complete by:  As directed      Discharge Exam: Filed Weights   11/25/17 1559 11/27/17 0500  Weight: 62.1 kg (137 lb) 62.1 kg (136 lb 12.8 oz)   Vitals:   11/27/17 0500 11/27/17 0800  BP: (!) 153/62   Pulse: 63 67  Resp: 17 18  Temp: 97.7 F (36.5 C)   SpO2: 93% 94%   General: Appear in no distress, no Rash; Oral Mucosa moist. Cardiovascular: S1 and S2 Present, aortic systolic Murmur, no JVD Respiratory: Bilateral Air entry present and Clear to Auscultation, no Crackles, no wheezes Abdomen: Bowel Sound present, Soft and no tenderness Extremities: no Pedal edema, no calf tenderness Neurology: Grossly no focal neuro deficit.  The results of significant diagnostics from this hospitalization (including imaging, microbiology, ancillary and laboratory) are listed below for reference.    Significant Diagnostic Studies: Dg Chest 2 View  Result Date: 11/25/2017 CLINICAL DATA:  Mid sternal chest pain EXAM: CHEST  2 VIEW COMPARISON:  06/27/2017 FINDINGS: Mild hyperinflation with minimal scarring at the bases. No focal consolidation or effusion. Mild cardiomegaly with aortic atherosclerosis. No pneumothorax. IMPRESSION: No active cardiopulmonary disease.  Mild cardiomegaly Electronically Signed   By: Donavan Foil M.D.   On: 11/25/2017 16:48   Ct Angio Chest Pe W Or Wo Contrast  Result Date: 11/26/2017 CLINICAL DATA:  Shortness of breath, elevated D-dimer EXAM: CT ANGIOGRAPHY CHEST WITH CONTRAST TECHNIQUE: Multidetector CT imaging of the chest was performed using the standard protocol during bolus administration of intravenous contrast. Multiplanar CT image  reconstructions and MIPs were obtained to evaluate the vascular anatomy. CONTRAST:  155m ISOVUE-370 IOPAMIDOL (ISOVUE-370) INJECTION 76% COMPARISON:  None. 09/02/2017 and previous FINDINGS: Cardiovascular: Left subclavian transvenous pacemaker. Heart size normal. Dilated central pulmonary arteries suggesting pulmonary hypertension. Satisfactory opacification of pulmonary arteries noted, and there is no evidence of pulmonary emboli. Moderate coronary calcifications. Incomplete opacification of the thoracic aorta. No aneurysm. Moderate calcified plaque in the arch and descending thoracic segment. Classic 3 vessel brachiocephalic arterial origin anatomy. No suggestion of dissection or intramural hematoma. No pericardial effusion. Mediastinum/Nodes: No hilar or mediastinal adenopathy. 1.2 cm left thyroid nodule. Lungs/Pleura: Chronic consolidation and bronchiectasis in the inferior lingula and anteromedial right middle lobe. Clustered nodular airspace opacities measuring up to  0.5 cm in the posterior right upper lobe, more conspicuous than on prior study. Stable nodular changes in the superior segment left lower lobe. Musculoskeletal: Negative for fracture or worrisome bone lesion. Upper Abdomen: Cholecystectomy clips.  No acute findings. Review of the MIP images confirms the above findings IMPRESSION: 1. Negative for acute PE or evidence of thoracic aortic dissection. 2. Progressive clustered nodular airspace opacities in the posterior right upper lobe, possibly infectious/inflammatory etiology but nonspecific. 3. Dilated central pulmonary arteries suggesting pulmonary arterial hypertension. 4. Coronary and Aortic Atherosclerosis (ICD10-170.0) Electronically Signed   By: Lucrezia Europe M.D.   On: 11/26/2017 09:36    Microbiology: Recent Results (from the past 240 hour(s))  Urine Culture     Status: Abnormal   Collection Time: 11/27/17  6:18 AM  Result Value Ref Range Status   Specimen Description URINE, CLEAN  CATCH  Final   Special Requests NONE  Final   Culture MULTIPLE SPECIES PRESENT, SUGGEST RECOLLECTION (A)  Final   Report Status 11/28/2017 FINAL  Final     Labs: CBC: Recent Labs  Lab 11/25/17 1613 11/26/17 0105 11/26/17 0356  WBC 5.2 5.1 5.0  NEUTROABS 3.2  --   --   HGB 12.5 12.3 12.6  HCT 38.6 37.9 39.4  MCV 93.7 93.6 93.4  PLT 172 157 347   Basic Metabolic Panel: Recent Labs  Lab 11/25/17 1613 11/26/17 0105 11/26/17 0356  NA 141  --  140  K 3.7  --  4.0  CL 108  --  111  CO2 24  --  21*  GLUCOSE 96  --  88  BUN 14  --  13  CREATININE 1.12* 1.08* 0.99  CALCIUM 9.1  --  8.8*   Liver Function Tests: Recent Labs  Lab 11/25/17 1613  AST 20  ALT 11*  ALKPHOS 43  BILITOT 0.6  PROT 5.9*  ALBUMIN 3.6   No results for input(s): LIPASE, AMYLASE in the last 168 hours. No results for input(s): AMMONIA in the last 168 hours. Cardiac Enzymes: Recent Labs  Lab 11/25/17 1613 11/26/17 0105 11/26/17 0356 11/26/17 1023 11/26/17 1322  CKTOTAL  --   --   --  21 61  TROPONINI <0.03 0.03* <0.03 <0.03  --    BNP (last 3 results) No results for input(s): BNP in the last 8760 hours. CBG: No results for input(s): GLUCAP in the last 168 hours. Time spent: 35 minutes  Signed:  Berle Mull  [facility] 11/27/2017 , 8:10 AM

## 2017-11-30 DIAGNOSIS — I82451 Acute embolism and thrombosis of right peroneal vein: Secondary | ICD-10-CM

## 2017-11-30 DIAGNOSIS — Z86718 Personal history of other venous thrombosis and embolism: Secondary | ICD-10-CM | POA: Insufficient documentation

## 2017-11-30 HISTORY — DX: Acute embolism and thrombosis of right peroneal vein: I82.451

## 2017-12-08 DIAGNOSIS — R531 Weakness: Secondary | ICD-10-CM

## 2017-12-09 DIAGNOSIS — R531 Weakness: Secondary | ICD-10-CM | POA: Diagnosis not present

## 2017-12-10 DIAGNOSIS — R531 Weakness: Secondary | ICD-10-CM | POA: Diagnosis not present

## 2017-12-11 DIAGNOSIS — R531 Weakness: Secondary | ICD-10-CM | POA: Diagnosis not present

## 2017-12-31 ENCOUNTER — Emergency Department (HOSPITAL_COMMUNITY): Payer: Medicare HMO

## 2017-12-31 ENCOUNTER — Observation Stay (HOSPITAL_COMMUNITY)
Admission: EM | Admit: 2017-12-31 | Discharge: 2018-01-01 | Disposition: A | Discharge status: Home / Self Care | Payer: Medicare HMO | Attending: Internal Medicine | Admitting: Internal Medicine

## 2017-12-31 ENCOUNTER — Encounter (HOSPITAL_COMMUNITY): Payer: Self-pay | Admitting: Emergency Medicine

## 2017-12-31 ENCOUNTER — Other Ambulatory Visit: Payer: Self-pay

## 2017-12-31 DIAGNOSIS — Z66 Do not resuscitate: Secondary | ICD-10-CM | POA: Insufficient documentation

## 2017-12-31 DIAGNOSIS — Z950 Presence of cardiac pacemaker: Secondary | ICD-10-CM | POA: Diagnosis not present

## 2017-12-31 DIAGNOSIS — N179 Acute kidney failure, unspecified: Secondary | ICD-10-CM | POA: Diagnosis not present

## 2017-12-31 DIAGNOSIS — I471 Supraventricular tachycardia: Secondary | ICD-10-CM | POA: Diagnosis not present

## 2017-12-31 DIAGNOSIS — I700 Atherosclerosis of aorta: Secondary | ICD-10-CM | POA: Insufficient documentation

## 2017-12-31 DIAGNOSIS — I5032 Chronic diastolic (congestive) heart failure: Secondary | ICD-10-CM | POA: Diagnosis not present

## 2017-12-31 DIAGNOSIS — Z885 Allergy status to narcotic agent status: Secondary | ICD-10-CM | POA: Insufficient documentation

## 2017-12-31 DIAGNOSIS — Z8249 Family history of ischemic heart disease and other diseases of the circulatory system: Secondary | ICD-10-CM | POA: Insufficient documentation

## 2017-12-31 DIAGNOSIS — K589 Irritable bowel syndrome without diarrhea: Secondary | ICD-10-CM | POA: Diagnosis not present

## 2017-12-31 DIAGNOSIS — E7800 Pure hypercholesterolemia, unspecified: Secondary | ICD-10-CM | POA: Diagnosis not present

## 2017-12-31 DIAGNOSIS — I2510 Atherosclerotic heart disease of native coronary artery without angina pectoris: Secondary | ICD-10-CM | POA: Diagnosis not present

## 2017-12-31 DIAGNOSIS — I491 Atrial premature depolarization: Secondary | ICD-10-CM | POA: Insufficient documentation

## 2017-12-31 DIAGNOSIS — I252 Old myocardial infarction: Secondary | ICD-10-CM | POA: Diagnosis not present

## 2017-12-31 DIAGNOSIS — Z87440 Personal history of urinary (tract) infections: Secondary | ICD-10-CM | POA: Insufficient documentation

## 2017-12-31 DIAGNOSIS — Z86718 Personal history of other venous thrombosis and embolism: Secondary | ICD-10-CM | POA: Insufficient documentation

## 2017-12-31 DIAGNOSIS — I110 Hypertensive heart disease with heart failure: Secondary | ICD-10-CM | POA: Insufficient documentation

## 2017-12-31 DIAGNOSIS — Z8719 Personal history of other diseases of the digestive system: Secondary | ICD-10-CM | POA: Insufficient documentation

## 2017-12-31 DIAGNOSIS — I482 Chronic atrial fibrillation: Secondary | ICD-10-CM | POA: Diagnosis not present

## 2017-12-31 DIAGNOSIS — Z79899 Other long term (current) drug therapy: Secondary | ICD-10-CM | POA: Insufficient documentation

## 2017-12-31 DIAGNOSIS — Z7901 Long term (current) use of anticoagulants: Secondary | ICD-10-CM | POA: Insufficient documentation

## 2017-12-31 DIAGNOSIS — Z96651 Presence of right artificial knee joint: Secondary | ICD-10-CM | POA: Insufficient documentation

## 2017-12-31 DIAGNOSIS — Z886 Allergy status to analgesic agent status: Secondary | ICD-10-CM | POA: Insufficient documentation

## 2017-12-31 DIAGNOSIS — I442 Atrioventricular block, complete: Secondary | ICD-10-CM | POA: Insufficient documentation

## 2017-12-31 DIAGNOSIS — Z86010 Personal history of colonic polyps: Secondary | ICD-10-CM | POA: Insufficient documentation

## 2017-12-31 DIAGNOSIS — I459 Conduction disorder, unspecified: Secondary | ICD-10-CM | POA: Diagnosis present

## 2017-12-31 DIAGNOSIS — K219 Gastro-esophageal reflux disease without esophagitis: Secondary | ICD-10-CM | POA: Insufficient documentation

## 2017-12-31 DIAGNOSIS — Z86711 Personal history of pulmonary embolism: Secondary | ICD-10-CM | POA: Diagnosis present

## 2017-12-31 DIAGNOSIS — Z888 Allergy status to other drugs, medicaments and biological substances status: Secondary | ICD-10-CM | POA: Insufficient documentation

## 2017-12-31 DIAGNOSIS — I951 Orthostatic hypotension: Principal | ICD-10-CM | POA: Insufficient documentation

## 2017-12-31 DIAGNOSIS — J449 Chronic obstructive pulmonary disease, unspecified: Secondary | ICD-10-CM | POA: Insufficient documentation

## 2017-12-31 DIAGNOSIS — R55 Syncope and collapse: Secondary | ICD-10-CM

## 2017-12-31 DIAGNOSIS — Z8673 Personal history of transient ischemic attack (TIA), and cerebral infarction without residual deficits: Secondary | ICD-10-CM | POA: Insufficient documentation

## 2017-12-31 DIAGNOSIS — J8410 Pulmonary fibrosis, unspecified: Secondary | ICD-10-CM | POA: Diagnosis not present

## 2017-12-31 DIAGNOSIS — I119 Hypertensive heart disease without heart failure: Secondary | ICD-10-CM | POA: Diagnosis present

## 2017-12-31 LAB — CBC
HCT: 40.9 % (ref 36.0–46.0)
HEMOGLOBIN: 13.3 g/dL (ref 12.0–15.0)
MCH: 30 pg (ref 26.0–34.0)
MCHC: 32.5 g/dL (ref 30.0–36.0)
MCV: 92.3 fL (ref 78.0–100.0)
PLATELETS: 170 10*3/uL (ref 150–400)
RBC: 4.43 MIL/uL (ref 3.87–5.11)
RDW: 13.3 % (ref 11.5–15.5)
WBC: 7 10*3/uL (ref 4.0–10.5)

## 2017-12-31 LAB — BASIC METABOLIC PANEL
ANION GAP: 12 (ref 5–15)
BUN: 29 mg/dL — ABNORMAL HIGH (ref 6–20)
CHLORIDE: 105 mmol/L (ref 101–111)
CO2: 24 mmol/L (ref 22–32)
CREATININE: 1.59 mg/dL — AB (ref 0.44–1.00)
Calcium: 9.7 mg/dL (ref 8.9–10.3)
GFR calc non Af Amer: 27 mL/min — ABNORMAL LOW (ref >60)
GFR, EST AFRICAN AMERICAN: 32 mL/min — AB (ref >60)
Glucose, Bld: 118 mg/dL — ABNORMAL HIGH (ref 65–99)
Potassium: 3.6 mmol/L (ref 3.5–5.1)
SODIUM: 141 mmol/L (ref 135–145)

## 2017-12-31 LAB — CBG MONITORING, ED: GLUCOSE-CAPILLARY: 106 mg/dL — AB (ref 65–99)

## 2017-12-31 MED ORDER — SODIUM CHLORIDE 0.9 % IV BOLUS (SEPSIS)
500.0000 mL | Freq: Once | INTRAVENOUS | Status: AC
  Administered 2017-12-31: 500 mL via INTRAVENOUS

## 2017-12-31 NOTE — ED Notes (Signed)
Patient transported to X-ray 

## 2017-12-31 NOTE — ED Triage Notes (Signed)
Pt brought to ED by EMS from home after having a syncope episode on the bathroom while trying to have a BM, unknown last seen well by EMS pt has hx of prior stroke, nauseated and vomiting after the syncope episode.  Pt c/o 4/10 back HA, denies hitting her head pt is on Xarelto at home. 4 mg IV zofran given by EMS pta to ED.  BP 126/72 initial BP by First responders 80's HR 71 AV Paced SPO2 100 % 2L CBG 191

## 2017-12-31 NOTE — ED Provider Notes (Signed)
[facility] EMERGENCY DEPARTMENT Provider Note   CSN: [id number] Arrival date & time: 12/31/17  2136     History   Chief Complaint Chief Complaint  Patient presents with  . Loss of Consciousness    HPI [name] [name] is a 84 y.o. female.  Patient with a history of atrial fibrillation, complete heart block s/p pacemaker placement ([employer]tr[employer]), HTN, GERD, kidney stones, DVT (negative doppler 11/28/17), HLD, CVA, IBS presents after being found unresponsive in the bathroom. She remembers getting ready for bed, going to the bathroom and sitting down. Nothing else. She is able to deny pre-event chest pain, SOB, dizziness or headache. Her husband went to check on her and found her just before she fell to the floor. He and son report she was unconscious for about 10-15 minutes. She had one episode emesis while on the floor and was incontinent of stool. No seizure activity or history of seizures. Currently she feels generally weak and tired and has a mild posterior headache. No nausea.    The history is provided by the patient, the spouse and a relative. No language interpreter was used.    Past Medical History:  Diagnosis Date  . Arthritis   . Bronchiectasis, non-tuberculous ([place])    a. Followed by Dr. Joya Gaskins.  . Colon polyps   . Complete heart block (HCC)    a. s/p MDT dual chamber PPM   . COPD (chronic obstructive pulmonary disease) ([place])    past hx with pneumonia  . Diverticulosis   . DVT (deep venous thrombosis) (HCC)    Resolved, no anticoagulation currently  . GERD (gastroesophageal reflux disease)   . Headache   . High cholesterol   . Hypertension   . IBS (irritable bowel syndrome)   . 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 MI (myocardial infarction) ?? probably not 1975   a. 1975 Reported MI;  b. 2011 Cath: nonobs dzs;  c. 2013 Nonischemic Myoview;  d. 12/2012 Echo: EF 60-65%, Gr1 DD.  . Nephrolithiasis   . Palpitation    a. 2010  Event Monitor: PACs and PVCs  . Pneumonia   . PONV (postoperative nausea and vomiting)   . Post-dural puncture headache 09/01/2017  . Presence of permanent cardiac pacemaker   . Pulmonary fibrosis ([place])   . Stroke ([place])   . Wears dentures    full  . Wide-complex tachycardia ([place])    a. Followed by Dr. Caryl Comes, "I think this is VT, but i am not sure QRSd 135 1:1 AV."    Patient Active Problem List   Diagnosis Date Noted  . Weakness generalized 11/25/2017  . Post-dural puncture headache 09/01/2017  . Chest pain with low risk for cardiac etiology 11/18/2015  . Pacemaker   . Bradycardia with 31 - 40 beats per minute 05/14/2015  . Heart block   . Pain in limb 08/16/2013  . Varicose veins of lower extremities with other complications 46/96/2952  . Edema -lower extremity 06/22/2013  . Syncope 12/15/2012  . Osteoarthritis of right knee 12/12/2012  . History of pulmonary embolism 08/30/2012  . Fatigue 08/19/2012  . Wide-complex tachycardia ([place]) 05/30/2012  . Bronchiectasis, non-tuberculous ([place]) 01/06/2012  . 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 Diverticulosis   . High cholesterol   . Coronary artery disease prior MI   . Hypertension     Past Surgical History:  Procedure Laterality Date  . APPENDECTOMY    . BLADDER SUSPENSION     tack  . CARDIAC  CATHETERIZATION Right 05/14/2015   Procedure: Temporary Pacemaker;  Surgeon: Thompson Grayer, MD;  Location: [facility];  Service: Cardiovascular;  Laterality: Right;  . CHOLECYSTECTOMY    . COLON SURGERY     12inches colectomy s/p diverticulitis  . COLONOSCOPY W/ BIOPSIES AND POLYPECTOMY    . EP IMPLANTABLE DEVICE N/A 05/14/2015   Procedure: Pacemaker Implant;  Surgeon: Thompson Grayer, MD; [place] 1 (serial number [id number])     . IR FL GUIDED LOC OF NEEDLE/CATH TIP FOR SPINAL INJECTION LT  09/06/2017  . KNEE ARTHROSCOPY Left 03/29/2017   Procedure: ARTHROSCOPY LEFT KNEE WITH PARTIAL  MEDIAL AND PARTIAL LATERAL MENISCECTOMY;  Surgeon: Vickey Huger, MD;  Location: [facility];  Service: Orthopedics;  Laterality: Left;  Marland Kitchen MULTIPLE TOOTH EXTRACTIONS    . TONSILLECTOMY    . TOTAL ABDOMINAL HYSTERECTOMY    . TOTAL KNEE ARTHROPLASTY  12/12/2012   Procedure: TOTAL KNEE ARTHROPLASTY;  Surgeon: Alta Corning, MD;  Location: [facility];  Service: Orthopedics;  Laterality: Right;  right total knee arthroplasty    OB History    No data available       Home Medications    Prior to Admission medications   Medication Sig Start Date End Date Taking? Authorizing Provider  amLODipine (NORVASC) 2.5 MG tablet Take 1 tablet (2.5 mg total) by mouth daily. Patient not taking: Reported on 11/25/2017 09/05/17   Kalman Shan Ratliff, DO  gabapentin (NEURONTIN) 100 MG capsule Take 1 capsule (100 mg total) by mouth at bedtime. 11/27/17   Lavina Hamman, MD  Rivaroxaban 15 & 20 MG TBPK Take as directed on package: Start with one 48m tablet by mouth twice a day with food. On Day 22, switch to one 2106mtablet once a day with food. 11/27/17   PaLavina HammanMD  vitamin B-12 (CYANOCOBALAMIN) 1000 MCG tablet Take 1,000 mcg by mouth daily.    [provider]    Family History Family History  Problem Relation Age of Onset  . Heart disease Father   . Heart attack Mother   . Stomach cancer Brother   . Other Son        [place]    Social History Social History   Tobacco Use  . Smoking status: Never Smoker  . Smokeless tobacco: Never Used  Substance Use Topics  . Alcohol use: No  . Drug use: No     Allergies   Crestor [rosuvastatin]; Darvon; Meloxicam; Meperidine; Prednisone; Valsartan; Amiodarone; Aspirin-acetaminophen-caffeine; Nitroglycerin; and Tramadol   Review of Systems Review of Systems  Constitutional: Positive for fatigue. Negative for activity change, appetite change, chills and fever.  HENT: Negative.  Negative for congestion and rhinorrhea.   Eyes: Negative.   Negative for visual disturbance.  Respiratory: Negative.  Negative for cough and shortness of breath.   Cardiovascular: Negative.  Negative for chest pain and palpitations.  Gastrointestinal: Positive for vomiting. Negative for abdominal pain.  Genitourinary: Negative.  Negative for dysuria and frequency.  Musculoskeletal: Negative.   Skin: Negative.   Neurological: Positive for syncope, weakness and headaches. Negative for seizures.     Physical Exam Updated Vital Signs BP (!) 139/59 (BP Location: Right Arm)   Pulse 63   Temp (!) 97.5 F (36.4 C) (Oral)   Resp 18   Ht 5' 6"  (1.676 m)   Wt 62.1 kg (137 lb)   SpO2 100%   BMI 22.11 kg/m   Physical Exam  Constitutional: She is  oriented to person, place, and time. She appears well-developed and well-nourished. No distress.  HENT:  Head: Normocephalic.  Mouth/Throat: Oropharynx is clear and moist.  Neck: Normal range of motion. Neck supple.  Cardiovascular: Normal rate and regular rhythm.  No murmur heard. No carotid bruit.  Pulmonary/Chest: Effort normal and breath sounds normal.  Abdominal: Soft. Bowel sounds are normal. There is no tenderness. There is no rebound and no guarding.  Musculoskeletal: Normal range of motion.  Neurological: She is alert and oriented to person, place, and time.  Left UE and LE weakness (chronic from stroke). No speech difficulty. No deficits of coordination. CN's 3-12 grossly intact. Ambulation not tested.   Skin: Skin is warm and dry. She is not diaphoretic. No erythema.  Psychiatric: She has a normal mood and affect.     ED Treatments / Results  Labs (all labs ordered are listed, but only abnormal results are displayed) Labs Reviewed  BASIC METABOLIC PANEL - Abnormal; Notable for the following components:      Result Value   Glucose, Bld 118 (*)    BUN 29 (*)    Creatinine, Ser 1.59 (*)    GFR calc non Af Amer 27 (*)    GFR calc Af Amer 32 (*)    All other components within normal  limits  CBG MONITORING, ED - Abnormal; Notable for the following components:   Glucose-Capillary 106 (*)    All other components within normal limits  CBC  URINALYSIS, ROUTINE W REFLEX MICROSCOPIC    EKG  EKG Interpretation  Date/Time:  Friday December 31 2017 21:48:35 EST Ventricular Rate:  64 PR Interval:    QRS Duration: 176 QT Interval:  482 QTC Calculation: 498 R Axis:   -85 Text Interpretation:  Ectopic atrial rhythm Atrial premature complex Short PR interval Left atrial enlargement Nonspecific IVCD with LAD Left ventricular hypertrophy previuos tracing paced Reconfirmed by Orlie Dakin [phone]) on 12/31/2017 10:57:10 PM       Radiology No results found.  Procedures Procedures (including critical care time)  Medications Ordered in ED Medications  sodium chloride 0.9 % bolus 500 mL (not administered)     Initial Impression / Assessment and Plan / ED Course  I have reviewed the triage vital signs and the nursing notes.  Pertinent labs & imaging results that were available during my care of the patient were reviewed by me and considered in my medical decision making (see chart for details).     Patient presents for evaluation of syncope x 1 prior to arrival while sitting on the toilet. No pre-event pain, SOB, dizziness. Family states duration of syncopal episode was about 15 minutes.   Labs show AKI. UA pending. [employer] evaluation shows runs of high atrial rate to 150 that occurred tonight.   She is seen and evaluated by Dr. Winfred Leeds. Concern for duration of unconsciousness of 15 minutes. With syncope, AKI, [employer] report and continuous generalized weakness, will admit to hospitalist for gentle hydration, observation, possible cardiology involvement tomorrow.   Final Clinical Impressions(s) / ED Diagnoses   Final diagnoses:  None   1. Syncope 2. Weakness 3. AKI  ED Discharge Orders    None       Charlann Lange, Hershal Coria 01/01/18 0125      Orlie Dakin, MD 01/01/18 1501

## 2017-12-31 NOTE — ED Provider Notes (Signed)
Suffered loss of consciousness while sitting on the commode this evening.  Her husband reports that she was unconscious for at least 5 minutes.  She is been complaining of generalized weakness for several days.  She denies any chest pain or shortness of breath   Doug Sou, MD 12/31/17 [phone]

## 2018-01-01 ENCOUNTER — Encounter (HOSPITAL_COMMUNITY): Payer: Self-pay | Admitting: Internal Medicine

## 2018-01-01 DIAGNOSIS — Z86711 Personal history of pulmonary embolism: Secondary | ICD-10-CM

## 2018-01-01 DIAGNOSIS — I951 Orthostatic hypotension: Secondary | ICD-10-CM

## 2018-01-01 DIAGNOSIS — R55 Syncope and collapse: Secondary | ICD-10-CM

## 2018-01-01 DIAGNOSIS — I10 Essential (primary) hypertension: Secondary | ICD-10-CM

## 2018-01-01 DIAGNOSIS — N179 Acute kidney failure, unspecified: Secondary | ICD-10-CM

## 2018-01-01 DIAGNOSIS — J189 Pneumonia, unspecified organism: Secondary | ICD-10-CM | POA: Insufficient documentation

## 2018-01-01 HISTORY — DX: Orthostatic hypotension: I95.1

## 2018-01-01 HISTORY — DX: Acute kidney failure, unspecified: N17.9

## 2018-01-01 LAB — CBC
HCT: 37.1 % (ref 36.0–46.0)
Hemoglobin: 12.2 g/dL (ref 12.0–15.0)
MCH: 30.3 pg (ref 26.0–34.0)
MCHC: 32.9 g/dL (ref 30.0–36.0)
MCV: 92.3 fL (ref 78.0–100.0)
PLATELETS: 151 10*3/uL (ref 150–400)
RBC: 4.02 MIL/uL (ref 3.87–5.11)
RDW: 13.4 % (ref 11.5–15.5)
WBC: 5.6 10*3/uL (ref 4.0–10.5)

## 2018-01-01 LAB — BASIC METABOLIC PANEL
Anion gap: 8 (ref 5–15)
Anion gap: 9 (ref 5–15)
BUN: 29 mg/dL — AB (ref 6–20)
BUN: 27 mg/dL — ABNORMAL HIGH (ref 6–20)
CHLORIDE: 107 mmol/L (ref 101–111)
CHLORIDE: 108 mmol/L (ref 101–111)
CO2: 24 mmol/L (ref 22–32)
CO2: 21 mmol/L — AB (ref 22–32)
CREATININE: 1.32 mg/dL — AB (ref 0.44–1.00)
CREATININE: 1.16 mg/dL — AB (ref 0.44–1.00)
Calcium: 9 mg/dL (ref 8.9–10.3)
Calcium: 8.7 mg/dL — ABNORMAL LOW (ref 8.9–10.3)
GFR calc Af Amer: 40 mL/min — ABNORMAL LOW (ref >60)
GFR calc non Af Amer: 34 mL/min — ABNORMAL LOW (ref >60)
GFR calc non Af Amer: 40 mL/min — ABNORMAL LOW (ref >60)
GFR, EST AFRICAN AMERICAN: 46 mL/min — AB (ref >60)
Glucose, Bld: 103 mg/dL — ABNORMAL HIGH (ref 65–99)
Glucose, Bld: 89 mg/dL (ref 65–99)
Potassium: 3.8 mmol/L (ref 3.5–5.1)
Potassium: 4.2 mmol/L (ref 3.5–5.1)
Sodium: 139 mmol/L (ref 135–145)
Sodium: 138 mmol/L (ref 135–145)

## 2018-01-01 LAB — TROPONIN I: Troponin I: <0.03 ng/mL (ref <0.03)

## 2018-01-01 LAB — URINALYSIS, ROUTINE W REFLEX MICROSCOPIC
BILIRUBIN URINE: NEGATIVE
Bacteria, UA: NONE SEEN
Glucose, UA: NEGATIVE mg/dL
KETONES UR: NEGATIVE mg/dL
LEUKOCYTES UA: NEGATIVE
Nitrite: NEGATIVE
Protein, ur: NEGATIVE mg/dL
SQUAMOUS EPITHELIAL / LPF: NONE SEEN
Specific Gravity, Urine: 1.016 (ref 1.005–1.030)
pH: 5 (ref 5.0–8.0)

## 2018-01-01 MED ORDER — RIVAROXABAN 15 MG PO TABS: 15.0000 mg | ORAL_TABLET | Freq: Every day | ORAL | Status: DC

## 2018-01-01 MED ORDER — SODIUM CHLORIDE 0.9 % IV BOLUS (SEPSIS)
500.0000 mL | Freq: Once | INTRAVENOUS | Status: AC
  Administered 2018-01-01: 500 mL via INTRAVENOUS

## 2018-01-01 MED ORDER — HYDRALAZINE HCL 20 MG/ML IJ SOLN: 5.0000 mg | INTRAMUSCULAR | Status: DC | PRN

## 2018-01-01 MED ORDER — ACETAMINOPHEN 650 MG RE SUPP: 650.0000 mg | Freq: Four times a day (QID) | RECTAL | Status: DC | PRN

## 2018-01-01 MED ORDER — ACETAMINOPHEN 325 MG PO TABS: 650.0000 mg | ORAL_TABLET | Freq: Four times a day (QID) | ORAL | Status: DC | PRN

## 2018-01-01 MED ORDER — ONDANSETRON HCL 4 MG PO TABS: 4.0000 mg | ORAL_TABLET | Freq: Four times a day (QID) | ORAL | Status: DC | PRN

## 2018-01-01 MED ORDER — VITAMIN B-12 1000 MCG PO TABS
1000.0000 ug | ORAL_TABLET | Freq: Every day | ORAL | Status: DC
  Administered 2018-01-01: 1000 ug via ORAL
  Filled 2018-01-01: qty 1

## 2018-01-01 MED ORDER — LISINOPRIL 10 MG PO TABS: 5.0000 mg | ORAL_TABLET | Freq: Every day | ORAL | Status: DC

## 2018-01-01 MED ORDER — ONDANSETRON HCL 4 MG/2ML IJ SOLN: 4.0000 mg | Freq: Four times a day (QID) | INTRAMUSCULAR | Status: DC | PRN

## 2018-01-01 NOTE — Progress Notes (Signed)
PROGRESS NOTE (plan of care)    [name] [name]  XTA:[id number]  DOB: Oct 13, 1924  DOA: 12/31/2017 PCP: [name] Bass, MD Outpatient Specialists:   Hospital course: [name] [name] is a 84 y.o. female with history of complete heart block status post pacemaker placement, DVT, history of stroke, atrial tachycardia, on Xarelto, diastolic CHF, recently admitted for UTI/possible pneumonia last month was brought to the ER after patient had a syncopal episode.  Patient was on the bathroom commode and she passed out.  She was moving her bowels.  Family noticed that patient was unresponsive and had to move her to the floor.  She will regain consciousness after 5 minutes.  Patient does not recall incident.  He was called and patient was brought to the ER.   Assessment & Plan:   Principal Problem:   Orthostatic syncope Active Problems:   Hypertension   History of pulmonary embolism   Heart block   AKI (acute kidney injury) (HCC)    # Orthostatic hypotension. Resting BP are actually hypertensive 140-150s/90s. On vitals repeat, still orthostatic. Hx of DVT but already on xarelto.  - giving another 500cc bolus (received 500cc earlier) - stopping home diuretics and BP meds - review of  dual PPM device check shows device is working appropriately. Atrial tach sensed but does not impact ventricular rate  - TTE recently done in 11/2017 - no need for EEG at this time  # AKI - improving with fluids - repeat BMP at noon pending  # Chronic afib c/b complete heart block s/p dual PPM - on xarelto - holding all HTN and rate control meds  DVT prophylaxis: on xarelto Code Status: DNR Disposition Plan: home today pending BMP and fluids   Subjective: - no complaints, feels great  Objective: Vitals:   01/01/18 0830 01/01/18 0900 01/01/18 0930 01/01/18 1000  BP: 138/63 (!) 142/53 (!) 123/57 (!) 151/67  Pulse: 62 (!) 58 61 63  Resp: 17 (!) 22 (!) 21 19  Temp:      TempSrc:      SpO2: 98% 98%  98% 97%  Weight:      Height:        Intake/Output Summary (Last 24 hours) at 01/01/2018 1225 Last data filed at 01/01/2018 0200 Gross per 24 hour  Intake 500 ml  Output 200 ml  Net 300 ml   Filed Weights   12/31/17 2147  Weight: 62.1 kg (137 lb)    Exam:  General exam: thin elderly caucasian woman Respiratory system: Clear. No increased work of breathing. Cardiovascular system: S1 & S2 heard, RRR. No JVD, murmurs, gallops, clicks or pedal edema. Gastrointestinal system: Abdomen is nondistended, soft and nontender. Normal bowel sounds heard. Central nervous system: Alert and oriented. No focal neurological deficits. Extremities: Symmetric 5 x 5 power.   Data Reviewed: Basic Metabolic Panel: Recent Labs  Lab 12/31/17 2148 01/01/18 0520  NA 141 139  K 3.6 3.8  CL 105 107  CO2 24 24  GLUCOSE 118* 103*  BUN 29* 29*  CREATININE 1.59* 1.32*  CALCIUM 9.7 9.0   Liver Function Tests: No results for input(s): AST, ALT, ALKPHOS, BILITOT, PROT, ALBUMIN in the last 168 hours. No results for input(s): LIPASE, AMYLASE in the last 168 hours. No results for input(s): AMMONIA in the last 168 hours. CBC: Recent Labs  Lab 12/31/17 2148 01/01/18 0520  WBC 7.0 5.6  HGB 13.3 12.2  HCT 40.9 37.1  MCV 92.3 92.3  PLT 170 151  Cardiac Enzymes: Recent Labs  Lab 01/01/18 0520  TROPONINI <0.03   BNP (last 3 results) No results for input(s): PROBNP in the last 8760 hours. CBG: Recent Labs  Lab 12/31/17 2153  GLUCAP 106*    No results found for this or any previous visit (from the past 240 hour(s)).       Studies: Dg Chest 2 View  Result Date: 12/31/2017 CLINICAL DATA:  Syncopal episode. EXAM: CHEST  2 VIEW COMPARISON:  12/08/2017 CXR and chest CT FINDINGS: Top-normal size heart. Moderate aortic atherosclerosis. Left-sided pacemaker apparatus with right atrial and right ventricular leads. No acute pneumonic consolidation. No acute displaced rib fracture. No significant  effusion. No pneumothorax. IMPRESSION: No active cardiopulmonary disease.  Aortic atherosclerosis. Electronically Signed   By: Tollie Eth M.D.   On: 12/31/2017 23:29   Ct Head Wo Contrast  Result Date: 12/31/2017 CLINICAL DATA:  84 year old female with syncope. EXAM: CT HEAD WITHOUT CONTRAST TECHNIQUE: Contiguous axial images were obtained from the base of the skull through the vertex without intravenous contrast. COMPARISON:  CT dated 12/11/2017 FINDINGS: Brain: There is moderate generalized atrophy. An area of old infarct and encephalomalacia noted in the left occipital lobe. There is no acute intracranial hemorrhage. No mass effect or midline shift stable prominence of bilateral extra-axial spaces with low attenuating fluid which may represent old subdural hematoma or hygroma and measure up to 15 mm over the right frontal lobe. Vascular: No hyperdense vessel or unexpected calcification. Skull: Normal. Negative for fracture or focal lesion. Sinuses/Orbits: Mild mucoperiosteal thickening of the left sphenoid sinus. No air-fluid level the mastoid air cells are clear. Other: None IMPRESSION: 1. No acute intracranial hemorrhage. 2. Age-related atrophy and chronic microvascular ischemic changes. Old left occipital infarct. 3. Dilated extra-axial spaces as seen on the prior CT likely representing bilateral subdural hygromas or chronic subdural hematoma no midline shift. Electronically Signed   By: Elgie Collard M.D.   On: 12/31/2017 23:47        Scheduled Meds: . rivaroxaban  15 mg Oral Daily  . vitamin B-12  1,000 mcg Oral Daily   Continuous Infusions: . sodium chloride      Principal Problem:   Orthostatic syncope Active Problems:   Hypertension   History of pulmonary embolism   Heart block   AKI (acute kidney injury) (HCC)    Time spent: 30 mins    Ike Bene, MD, FACP, [facility]. [facility] Pager [phone]  If 7PM-7AM, please contact  night-coverage [email or web address] Password [facility] 01/01/2018, 12:25 PM    LOS: 0 days

## 2018-01-01 NOTE — ED Notes (Signed)
ORDERED DIET TRAY FOR PT  

## 2018-01-01 NOTE — H&P (Signed)
History and Physical    [name] [name] TKW:[id number] DOB: [birth year] DOA: 12/31/2017  PCP: Algis Greenhouse, MD  Patient coming from: Home.  Chief Complaint: Loss of consciousness.  HPI: [name] [name] is a 84 y.o. female with history of complete heart block status post pacemaker placement, DVT, history of stroke, atrial tachycardia, on Xarelto, diastolic CHF, recently admitted for UTI/possible pneumonia last month was brought to the ER after patient had a syncopal episode.  Patient was on the bathroom commode and she passed out.  She was moving her bowels.  Family noticed that patient was unresponsive and had to move her to the floor.  She will regain consciousness after 5 minutes.  Patient does not recall incident.  He was called and patient was brought to the ER.  ED Course: In the ER patient's pacemaker was interrogated and found patient had multiple episodes of atrial tachycardia.  Patient presently is alert awake oriented with no focal deficit.  CT head was showing chronic hygroma versus chronic subdural hematoma.  EKG shows ectopic atrial rhythm.  Patient states she has been taking Lasix over the last few days which may be new.  Lab work also showed acute renal failure.  Patient was given a total of 1 L bolus in the ER.  Review of Systems: As per HPI, rest all negative.   Past Medical History:  Diagnosis Date  . Arthritis   . Bronchiectasis, non-tuberculous ([place])    a. Followed by Dr. Joya Gaskins.  . Colon polyps   . Complete heart block (HCC)    a. s/p MDT dual chamber PPM   . COPD (chronic obstructive pulmonary disease) ([place])    past hx with pneumonia  . Diverticulosis   . DVT (deep venous thrombosis) (HCC)    Resolved, no anticoagulation currently  . GERD (gastroesophageal reflux disease)   . Headache   . High cholesterol   . Hypertension   . IBS (irritable bowel syndrome)   . 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 MI (myocardial infarction) ?? probably  not 1975   a. 1975 Reported MI;  b. 2011 Cath: nonobs dzs;  c. 2013 Nonischemic Myoview;  d. 12/2012 Echo: EF 60-65%, Gr1 DD.  . Nephrolithiasis   . Palpitation    a. 2010 Event Monitor: PACs and PVCs  . Pneumonia   . PONV (postoperative nausea and vomiting)   . Post-dural puncture headache 09/01/2017  . Presence of permanent cardiac pacemaker   . Pulmonary fibrosis ([place])   . Stroke ([place])   . Wears dentures    full  . Wide-complex tachycardia ([place])    a. Followed by Dr. Caryl Comes, "I think this is VT, but i am not sure QRSd 135 1:1 AV."    Past Surgical History:  Procedure Laterality Date  . APPENDECTOMY    . BLADDER SUSPENSION     tack  . CARDIAC CATHETERIZATION Right 05/14/2015   Procedure: Temporary Pacemaker;  Surgeon: Thompson Grayer, MD;  Location: [place] [facility];  Service: Cardiovascular;  Laterality: Right;  . CHOLECYSTECTOMY    . COLON SURGERY     12inches colectomy s/p diverticulitis  . COLONOSCOPY W/ BIOPSIES AND POLYPECTOMY    . EP IMPLANTABLE DEVICE N/A 05/14/2015   Procedure: Pacemaker Implant;  Surgeon: Thompson Grayer, MD; [place] 1 (serial number [id number])     . IR FL GUIDED LOC OF NEEDLE/CATH TIP FOR SPINAL INJECTION LT  09/06/2017  . KNEE ARTHROSCOPY Left 03/29/2017  Procedure: ARTHROSCOPY LEFT KNEE WITH PARTIAL MEDIAL AND PARTIAL LATERAL MENISCECTOMY;  Surgeon: Vickey Huger, MD;  Location: [place];  Service: Orthopedics;  Laterality: Left;  [place] Kitchen MULTIPLE TOOTH EXTRACTIONS    . TONSILLECTOMY    . TOTAL ABDOMINAL HYSTERECTOMY    . TOTAL KNEE ARTHROPLASTY  12/12/2012   Procedure: TOTAL KNEE ARTHROPLASTY;  Surgeon: Alta Corning, MD;  Location: [place];  Service: Orthopedics;  Laterality: Right;  right total knee arthroplasty     reports that  has never smoked. she has never used smokeless tobacco. She reports that she does not drink alcohol or use drugs.  Allergies  Allergen Reactions  . Crestor [Rosuvastatin] Other (See Comments)    Myalgia  .  Darvon Other (See Comments)    Drop in  BP  . Meloxicam Other (See Comments)    Dizziness   . Meperidine Other (See Comments)    Hypotension  . Prednisone Diarrhea    POLYURIA  . Valsartan Other (See Comments)     Hypotension  . Amiodarone Other (See Comments)    Did not tolerate - unknown reaction  . Aspirin-Acetaminophen-Caffeine Other (See Comments)    Unknown reaction  . Nitroglycerin     Profound hypotension with sublingual nitroglycerin.   . Tramadol Other (See Comments)    Unknown reaction    Family History  Problem Relation Age of Onset  . Heart disease Father   . Heart attack Mother   . Stomach cancer Brother   . Other Son        Triple Bypass    Prior to Admission medications   Medication Sig Start Date End Date Taking? Authorizing Provider  furosemide (LASIX) 20 MG tablet Take 20 mg by mouth daily. 12/11/17  Yes [provider]  lisinopril (PRINIVIL,ZESTRIL) 5 MG tablet Take 5 mg by mouth daily.   Yes [provider]  Rivaroxaban (XARELTO) 15 MG TABS tablet Take 15 mg by mouth daily. 12/17/17  Yes [provider]  vitamin B-12 (CYANOCOBALAMIN) 1000 MCG tablet Take 1,000 mcg by mouth daily.   Yes [provider]  amLODipine (NORVASC) 2.5 MG tablet Take 1 tablet (2.5 mg total) by mouth daily. Patient not taking: Reported on 11/25/2017 09/05/17   Kalman Shan Ratliff, DO  gabapentin (NEURONTIN) 100 MG capsule Take 1 capsule (100 mg total) by mouth at bedtime. Patient not taking: Reported on 01/01/2018 11/27/17   Lavina Hamman, MD  Rivaroxaban 15 & 20 MG TBPK Take as directed on package: Start with one 36m tablet by mouth twice a day with food. On Day 22, switch to one 244mtablet once a day with food. Patient not taking: Reported on 01/01/2018 11/27/17   PaLavina HammanMD    Physical Exam: Vitals:   01/01/18 0145 01/01/18 0215 01/01/18 0245 01/01/18 0315  BP: (!) 129/53 (!) 143/63 (!) 143/62 (!) 152/65  Pulse: 60 60 (!) 59 (!)  59  Resp: _0 Temp:      TempSrc:      SpO2: 95% 97% 99% 97%  Weight:      Height:          Constitutional: Moderately built and nourished. Vitals:   01/01/18 0145 01/01/18 0215 01/01/18 0245 01/01/18 0315  BP: (!) 129/53 (!) 143/63 (!) 143/62 (!) 152/65  Pulse: 60 60 (!) 59 (!) 59  Resp: _1 Temp:      TempSrc:      SpO2: 95% 97% 99%  97%  Weight:      Height:       Eyes: Anicteric no pallor. ENMT: No discharge from the ears eyes nose or mouth. Neck: No mass felt.  No neck rigidity.  No JVD appreciated. Respiratory: No rhonchi or crepitations. Cardiovascular: S1-S2 heard no murmurs appreciated. Abdomen: Soft nontender bowel sounds present. Musculoskeletal: No edema.  No joint effusion. Skin: No rash.  Skin appears warm. Neurologic: Alert awake oriented to time place and person.  Moves all extremities. Psychiatric: Appears normal.  Normal affect.   Labs on Admission: I have personally reviewed following labs and imaging studies  CBC: Recent Labs  Lab 12/31/17 2148  WBC 7.0  HGB 13.3  HCT 40.9  MCV 92.3  PLT 761   Basic Metabolic Panel: Recent Labs  Lab 12/31/17 2148  NA 141  K 3.6  CL 105  CO2 24  GLUCOSE 118*  BUN 29*  CREATININE 1.59*  CALCIUM 9.7   GFR: Estimated Creatinine Clearance: 21.6 mL/min (A) (by C-G formula based on SCr of 1.59 mg/dL (H)). Liver Function Tests: No results for input(s): AST, ALT, ALKPHOS, BILITOT, PROT, ALBUMIN in the last 168 hours. No results for input(s): LIPASE, AMYLASE in the last 168 hours. No results for input(s): AMMONIA in the last 168 hours. Coagulation Profile: No results for input(s): INR, PROTIME in the last 168 hours. Cardiac Enzymes: No results for input(s): CKTOTAL, CKMB, CKMBINDEX, TROPONINI in the last 168 hours. BNP (last 3 results) No results for input(s): PROBNP in the last 8760 hours. HbA1C: No results for input(s): HGBA1C in the last 72 hours. CBG: Recent Labs  Lab  12/31/17 2153  GLUCAP 106*   Lipid Profile: No results for input(s): CHOL, HDL, LDLCALC, TRIG, CHOLHDL, LDLDIRECT in the last 72 hours. Thyroid Function Tests: No results for input(s): TSH, T4TOTAL, FREET4, T3FREE, THYROIDAB in the last 72 hours. Anemia Panel: No results for input(s): VITAMINB12, FOLATE, FERRITIN, TIBC, IRON, RETICCTPCT in the last 72 hours. Urine analysis:    Component Value Date/Time   COLORURINE YELLOW 01/01/2018 0123   APPEARANCEUR HAZY (A) 01/01/2018 0123   LABSPEC 1.016 01/01/2018 0123   PHURINE 5.0 01/01/2018 0123   GLUCOSEU NEGATIVE 01/01/2018 0123   HGBUR SMALL (A) 01/01/2018 0123   BILIRUBINUR NEGATIVE 01/01/2018 0123   KETONESUR NEGATIVE 01/01/2018 0123   PROTEINUR NEGATIVE 01/01/2018 0123   UROBILINOGEN 0.2 12/06/2012 0916   NITRITE NEGATIVE 01/01/2018 0123   LEUKOCYTESUR NEGATIVE 01/01/2018 0123   Sepsis Labs: _0 (procalcitonin:4,lacticidven:4) )No results found for this or any previous visit (from the past 240 hour(s)).   Radiological Exams on Admission: Dg Chest 2 View  Result Date: 12/31/2017 CLINICAL DATA:  Syncopal episode. EXAM: CHEST  2 VIEW COMPARISON:  12/08/2017 CXR and chest CT FINDINGS: Top-normal size heart. Moderate aortic atherosclerosis. Left-sided pacemaker apparatus with right atrial and right ventricular leads. No acute pneumonic consolidation. No acute displaced rib fracture. No significant effusion. No pneumothorax. IMPRESSION: No active cardiopulmonary disease.  Aortic atherosclerosis. Electronically Signed   By: Ashley Royalty M.D.   On: 12/31/2017 23:29   Ct Head Wo Contrast  Result Date: 12/31/2017 CLINICAL DATA:  84 year old female with syncope. EXAM: CT HEAD WITHOUT CONTRAST TECHNIQUE: Contiguous axial images were obtained from the base of the skull through the vertex without intravenous contrast. COMPARISON:  CT dated 12/11/2017 FINDINGS: Brain: There is moderate generalized atrophy. An area of old infarct and  encephalomalacia noted in the left occipital lobe. There is no acute intracranial hemorrhage. No mass effect or midline  shift stable prominence of bilateral extra-axial spaces with low attenuating fluid which may represent old subdural hematoma or hygroma and measure up to 15 mm over the right frontal lobe. Vascular: No hyperdense vessel or unexpected calcification. Skull: Normal. Negative for fracture or focal lesion. Sinuses/Orbits: Mild mucoperiosteal thickening of the left sphenoid sinus. No air-fluid level the mastoid air cells are clear. Other: None IMPRESSION: 1. No acute intracranial hemorrhage. 2. Age-related atrophy and chronic microvascular ischemic changes. Old left occipital infarct. 3. Dilated extra-axial spaces as seen on the prior CT likely representing bilateral subdural hygromas or chronic subdural hematoma no midline shift. Electronically Signed   By: Anner Crete M.D.   On: 12/31/2017 23:47    EKG: Independently reviewed.  Atrial ectopic rhythm.  Assessment/Plan Principal Problem:   Syncope Active Problems:   Hypertension   History of pulmonary embolism   Heart block    1. Syncope -patient was on the commode moving bowels when syncope are clear.  Could be vasovagal.  Or on pacemaker interrogation it was found that patient had multiple runs of atrial tachycardia.  May discuss with cardiologist in the morning.  Patient is not to Dr. Caryl Comes.  Patient also states he was recently started on Lasix.  Lab work showed acute renal failure.  Hold off Lasix patient received 1 L fluid..  Check orthostatics in the morning.  2. CT head shows chronic hygroma versus subdural hematoma -I discussed with on-call neurologist Dr. Leonel Ramsay.  At this time Dr. Leonel Ramsay does not feel that patient's Xarelto needs to be held but may discuss with neurosurgery in the morning. 3. Acute renal failure could be from recent use of Lasix.  Patient received 1 L fluid bolus in the ER.  Follow metabolic panel.   We will hold lisinopril and Lasix for now.  Once creatinine improves restart lisinopril and Lasix as clinically indicated. 4. History of recent DVT on Xarelto.  See #2. 5. Hypertension on lisinopril.  Holding lisinopril due to acute renal failure.  Will keep patient on PRN IV hydralazine. 6. History of complete heart block status post pacemaker placement. 7. History of atrial tachycardia.   DVT prophylaxis: Xarelto. Code Status: DNR. Family Communication: Patient's son and husband. Disposition Plan: Home. Consults called: Cardiology consult requested. Admission status: Observation.   Rise Patience MD [facility] Pager [phone].  If 7PM-7AM, please contact night-coverage [email or web address] Password [facility]  01/01/2018, 4:15 AM

## 2018-01-01 NOTE — Discharge Instructions (Signed)
Orthostatic Hypotension Orthostatic hypotension is a sudden drop in blood pressure that happens when you quickly change positions, such as when you get up from a seated or lying position. Blood pressure is a measurement of how strongly, or weakly, your blood is pressing against the walls of your arteries. Arteries are blood vessels that carry blood from your heart throughout your body. When blood pressure is too low, you may not get enough blood to your brain or to the rest of your organs. This can cause weakness, light-headedness, rapid heartbeat, and fainting. This can last for just a few seconds or for up to a few minutes. Orthostatic hypotension is usually not a serious problem. However, if it happens frequently or gets worse, it may be a sign of something more serious. What are the causes? This condition may be caused by:  Sudden changes in posture, such as standing up quickly after you have been sitting or lying down.  Blood loss.  Loss of body fluids (dehydration).  Heart problems.  Hormone (endocrine) problems.  Pregnancy.  Severe infection.  Lack of certain nutrients.  Severe allergic reactions (anaphylaxis).  Certain medicines, such as blood pressure medicine or medicines that make the body lose excess fluids (diuretics). Sometimes, this condition can be caused by not taking medicine as directed, such as taking too much of a certain medicine.  What increases the risk? Certain factors can make you more likely to develop orthostatic hypotension, including:  Age. Risk increases as you get older.  Conditions that affect the heart or the central nervous system.  Taking certain medicines, such as blood pressure medicine or diuretics.  Being pregnant.  What are the signs or symptoms? Symptoms of this condition may include:  Weakness.  Light-headedness.  Dizziness.  Blurred vision.  Fatigue.  Rapid heartbeat.  Fainting, in severe cases.  How is this  diagnosed? This condition is diagnosed based on:  Your medical history.  Your symptoms.  Your blood pressure measurement. Your health care provider will check your blood pressure when you are: ? Lying down. ? Sitting. ? Standing.  A blood pressure reading is recorded as two numbers, such as "120 over 80" (or 120/80). The first ("top") number is called the systolic pressure. It is a measure of the pressure in your arteries as your heart beats. The second ("bottom") number is called the diastolic pressure. It is a measure of the pressure in your arteries when your heart relaxes between beats. Blood pressure is measured in a unit called mm Hg. Healthy blood pressure for adults is 120/80. If your blood pressure is below 90/60, you may be diagnosed with hypotension. Other information or tests that may be used to diagnose orthostatic hypotension include:  Your other vital signs, such as your heart rate and temperature.  Blood tests.  Tilt table test. For this test, you will be safely secured to a table that moves you from a lying position to an upright position. Your heart rhythm and blood pressure will be monitored during the test.  How is this treated? Treatment for this condition may include:  Changing your diet. This may involve eating more salt (sodium) or drinking more water.  Taking medicines to raise your blood pressure.  Changing the dosage of certain medicines you are taking that might be lowering your blood pressure.  Wearing compression stockings. These stockings help to prevent blood clots and reduce swelling in your legs.  In some cases, you may need to go to the hospital for:  Fluid replacement. This means you will receive fluids through an IV tube.  Blood replacement. This means you will receive donated blood through an IV tube (transfusion).  Treating an infection or heart problems, if this applies.  Monitoring. You may need to be monitored while medicines that you  are taking wear off.  Follow these instructions at home: Eating and drinking   Drink enough fluid to keep your urine clear or pale yellow.  Eat a healthy diet and follow instructions from your health care provider about eating or drinking restrictions. A healthy diet includes: ? Fresh fruits and vegetables. ? Whole grains. ? Lean meats. ? Low-fat dairy products.  Eat extra salt only as directed. Do not add extra salt to your diet unless your health care provider told you to do that.  Eat frequent, small meals.  Avoid standing up suddenly after eating. Medicines  Take over-the-counter and prescription medicines only as told by your health care provider. ? Follow instructions from your health care provider about changing the dosage of your current medicines, if this applies. ? Do not stop or adjust any of your medicines on your own. General instructions  Wear compression stockings as told by your health care provider.  Get up slowly from lying down or sitting positions. This gives your blood pressure a chance to adjust.  Avoid hot showers and excessive heat as directed by your health care provider.  Return to your normal activities as told by your health care provider. Ask your health care provider what activities are safe for you.  Do not use any products that contain nicotine or tobacco, such as cigarettes and e-cigarettes. If you need help quitting, ask your health care provider.  Keep all follow-up visits as told by your health care provider. This is important. Contact a health care provider if:  You vomit.  You have diarrhea.  You have a fever for more than 2-3 days.  You feel more thirsty than usual.  You feel weak and tired. Get help right away if:  You have chest pain.  You have a fast or irregular heartbeat.  You develop numbness in any part of your body.  You cannot move your arms or your legs.  You have trouble speaking.  You become sweaty or feel  lightheaded.  You faint.  You feel short of breath.  You have trouble staying awake.  You feel confused. This information is not intended to replace advice given to you by your health care provider. Make sure you discuss any questions you have with your health care provider. Document Released: 10/16/2002 Document Revised: 07/14/2016 Document Reviewed: 04/17/2016 Elsevier Interactive Patient Education  2018 [employer].   Acute Kidney Injury, Adult Acute kidney injury is a sudden worsening of kidney function. The kidneys are organs that have several jobs. They filter the blood to remove waste products and extra fluid. They also maintain a healthy balance of minerals and hormones in the body, which helps control blood pressure and keep bones strong. With this condition, your kidneys do not do their jobs as well as they should. This condition ranges from mild to severe. Over time it may develop into long-lasting (chronic) kidney disease. Early detection and treatment may prevent acute kidney injury from developing into a chronic condition. What are the causes? Common causes of this condition include:  A problem with blood flow to the kidneys. This may be caused by: ? Low blood pressure (hypotension) or shock. ? Blood loss. ? Heart and  blood vessel (cardiovascular) disease. ? Severe burns. ? Liver disease.  Direct damage to the kidneys. This may be caused by: ? Certain medicines. ? A kidney infection. ? Poisoning. ? Being around or in contact with toxic substances. ? A surgical wound. ? A hard, direct hit to the kidney area.  A sudden blockage of urine flow. This may be caused by: ? Cancer. ? Kidney stones. ? An enlarged prostate in males.  What are the signs or symptoms? Symptoms of this condition may not be obvious until the condition becomes severe. Symptoms of this condition can include:  Tiredness (lethargy), or difficulty staying awake.  Nausea or  vomiting.  Swelling (edema) of the face, legs, ankles, or feet.  Problems with urination, such as: ? Abdominal pain, or pain along the side of your stomach (flank). ? Decreased urine production. ? Decrease in the force of urine flow.  Muscle twitches and cramps, especially in the legs.  Confusion or trouble concentrating.  Loss of appetite.  Fever.  How is this diagnosed? This condition may be diagnosed with tests, including:  Blood tests.  Urine tests.  Imaging tests.  A test in which a sample of tissue is removed from the kidneys to be examined under a microscope (kidney biopsy).  How is this treated? Treatment for this condition depends on the cause and how severe the condition is. In mild cases, treatment may not be needed. The kidneys may heal on their own. In more severe cases, treatment will involve:  Treating the cause of the kidney injury. This may involve changing any medicines you are taking or adjusting your dosage.  Fluids. You may need specialized IV fluids to balance your body's needs.  Having a catheter placed to drain urine and prevent blockages.  Preventing problems from occurring. This may mean avoiding certain medicines or procedures that can cause further injury to the kidneys.  In some cases treatment may also require:  A procedure to remove toxic wastes from the body (dialysis or continuous renal replacement therapy - CRRT).  Surgery. This may be done to repair a torn kidney, or to remove the blockage from the urinary system.  Follow these instructions at home: Medicines  Take over-the-counter and prescription medicines only as told by your health care provider.  Do not take any new medicines without your health care provider's approval. Many medicines can worsen your kidney damage.  Do not take any vitamin and mineral supplements without your health care provider's approval. Many nutritional supplements can worsen your kidney  damage. Lifestyle  If your health care provider prescribed changes to your diet, follow them. You may need to decrease the amount of protein you eat.  Achieve and maintain a healthy weight. If you need help with this, ask your health care provider.  Start or continue an exercise plan. Try to exercise at least 30 minutes a day, 5 days a week.  Do not use any tobacco products, such as cigarettes, chewing tobacco, and e-cigarettes. If you need help quitting, ask your health care provider. General instructions  Keep track of your blood pressure. Report changes in your blood pressure as told by your health care provider.  Stay up to date with immunizations. Ask your health care provider which immunizations you need.  Keep all follow-up visits as told by your health care provider. This is important. Where to find more information:  American Association of Kidney Patients: ResidentialShow.is  [employer]: www.kidney.org  American Kidney Fund: FightingMatch.com.ee  Life Options  Rehabilitation Program: ? www.lifeoptions.org ? www.kidneyschool.org Contact a health care provider if:  Your symptoms get worse.  You develop new symptoms. Get help right away if:  You develop symptoms of worsening kidney disease, which include: ? Headaches. ? Abnormally dark or light skin. ? Easy bruising. ? Frequent hiccups. ? Chest pain. ? Shortness of breath. ? End of menstruation in women. ? Seizures. ? Confusion or altered mental status. ? Abdominal or back pain. ? Itchiness.  You have a fever.  Your body is producing less urine.  You have pain or bleeding when you urinate. Summary  Acute kidney injury is a sudden worsening of kidney function.  Acute kidney injury can be caused by problems with blood flow to the kidneys, direct damage to the kidneys, and sudden blockage of urine flow.  Symptoms of this condition may not be obvious until it becomes severe. Symptoms may include edema,  lethargy, confusion, nausea or vomiting, and problems passing urine.  This condition can usually be diagnosed with blood tests, urine tests, and imaging tests. Sometimes a kidney biopsy is done to diagnose this condition.  Treatment for this condition often involves treating the underlying cause. It is treated with fluids, medicines, dialysis, diet changes, or surgery. This information is not intended to replace advice given to you by your health care provider. Make sure you discuss any questions you have with your health care provider. Document Released: 05/11/2011 Document Revised: 02/25/2017 Document Reviewed: 10/16/2016 Elsevier Interactive Patient Education  [employer].

## 2018-01-01 NOTE — ED Notes (Signed)
Heart healthy breakfast tray ordered 

## 2018-01-01 NOTE — Discharge Summary (Signed)
Physician Discharge Summary  [name] [name]  FOY:[id number]  DOB: 11/03/1924  DOA: 12/31/2017  PCP: Olive Bass, MD  Admit date: 12/31/2017 Discharge date: 01/01/2018  Time spent: 30 minutes  Recommendations for Outpatient Follow-up:  1. Stop all blood pressures including amlodipine and lisinopril 2. Stop lasix 3. Follow up blood pressures with primary care doctor 4. Also stopping gabapentin (although pt reports not taking at home)  Discharge Diagnoses:  Principal Problem:   Orthostatic syncope Active Problems:   Hypertension   History of pulmonary embolism   Heart block   AKI (acute kidney injury) [facility])   Discharge Condition: Improved & Stable  Diet recommendation: heart healthy  Filed Weights   12/31/17 2147  Weight: 62.1 kg (137 lb)    History of present illness:  [name] Zasada Bowersis a 84 y.o.femalewithhistory of complete heart block status post pacemaker placement, DVT, history of stroke, atrial tachycardia, on Xarelto, diastolic CHF, recently admitted for UTI/possible pneumonia last month was brought to the ER after patient had a syncopal episode. Patient was on the bathroom commode and she passed out. She was moving her bowels. Family noticed that patient was unresponsive and had to move her to the floor. She will regain consciousness after 5 minutes. Patient does not recall incident. He was called and patient was brought to the ER.   Hospital Course:  # Orthostatic hypotension causing syncope and AKI.  Resting BP were actually borderline hypertensive 140-150s/90s however +orthotatic vitals with BP drop to 120s/80s. Hx of DVT but already on xarelto. Administered total 1L fluids. Cr improved from 1.59 to 1.16 with fluids. Patient able to ambulate after fluids without orthostatic sx. Review of dual PPM device check shows device is working appropriately. Atrial tach (known afib) sensed but does not impact ventricular rate which has been stable in 60s. TTE  previously done in 11/2017, thus not repeated. Discontinued home diuretics and stop BP meds on discharge. Will continue on home xarelto.   Consultations:  none  Procedures:  none  Discharge Exam:  Complaints: none  Vitals:   01/01/18 1100 01/01/18 1130 01/01/18 1200 01/01/18 1230  BP: (!) 146/61 (!) 156/60 (!) 147/67 (!) 165/56  Pulse: (!) 59 60 64 62  Resp: (!) 28 18 (!) 24 16  Temp:      TempSrc:      SpO2: 100% 97% 97% 96%  Weight:      Height:       General exam: thin elderly caucasian woman Respiratory system: Clear. No increased work of breathing. Cardiovascular system: S1 & S2 heard, RRR. No JVD, murmurs, gallops, clicks or pedal edema. Gastrointestinal system: Abdomen is nondistended, soft and nontender. Normal bowel sounds heard. Central nervous system: Alert and oriented. No focal neurological deficits. Extremities: Symmetric 5 x 5 power.    Discharge Instructions  Discharge Instructions    Call MD for:  difficulty breathing, headache or visual disturbances   Complete by:  As directed    Call MD for:  persistant dizziness or light-headedness   Complete by:  As directed    Diet - low sodium heart healthy   Complete by:  As directed    Discharge instructions   Complete by:  As directed    Please stop your blood pressure medications: stop amlodipine, lisinopril, and furosemide (lasix aka water pill) Please discuss with your outpatient doctor about resuming one of your blood pressure medicines if the pressure gets high again.   Increase activity slowly   Complete by:  As  directed      Allergies as of 01/01/2018      Reactions   Crestor [rosuvastatin] Other (See Comments)   Myalgia   Darvon Other (See Comments)   Drop in  BP   Meloxicam Other (See Comments)   Dizziness    Meperidine Other (See Comments)   Hypotension   Prednisone Diarrhea   POLYURIA   Valsartan Other (See Comments)    Hypotension   Amiodarone Other (See Comments)   Did not  tolerate - unknown reaction   Aspirin-acetaminophen-caffeine Other (See Comments)   Unknown reaction   Nitroglycerin    Profound hypotension with sublingual nitroglycerin.    Tramadol Other (See Comments)   Unknown reaction      Medication List    STOP taking these medications   amLODipine 2.5 MG tablet Commonly known as:  NORVASC   furosemide 20 MG tablet Commonly known as:  LASIX   gabapentin 100 MG capsule Commonly known as:  NEURONTIN   lisinopril 5 MG tablet Commonly known as:  PRINIVIL,ZESTRIL     TAKE these medications   Rivaroxaban 15 & 20 MG Tbpk Take as directed on package: Start with one 15mg  tablet by mouth twice a day with food. On Day 22, switch to one 20mg  tablet once a day with food. What changed:  Another medication with the same name was removed. Continue taking this medication, and follow the directions you see here.   vitamin B-12 1000 MCG tablet Commonly known as:  CYANOCOBALAMIN Take 1,000 mcg by mouth daily.        Get Medicines reviewed and adjusted: Please take all your medications with you for your next visit with your Primary MD  Please request your Primary MD to go over all hospital tests and procedure/radiological results at the follow up. Please ask your Primary MD to get all Hospital records sent to his/her office.  If you experience worsening of your admission symptoms, develop shortness of breath, life threatening emergency, suicidal or homicidal thoughts you must seek medical attention immediately by calling 911 or calling your MD immediately if symptoms less severe.  You must read complete instructions/literature along with all the possible adverse reactions/side effects for all the Medicines you take and that have been prescribed to you. Take any new Medicines after you have completely understood and accept all the possible adverse reactions/side effects.   Do not drive when taking pain medications.   Do not take more than  prescribed Pain, Sleep and Anxiety Medications  Special Instructions: If you have smoked or chewed Tobacco in the last 2 yrs please stop smoking, stop any regular Alcohol and or any Recreational drug use.  Wear Seat belts while driving.  Please note  You were cared for by a hospitalist during your hospital stay. Once you are discharged, your primary care physician will handle any further medical issues. Please note that NO REFILLS for any discharge medications will be authorized once you are discharged, as it is imperative that you return to your primary care physician (or establish a relationship with a primary care physician if you do not have one) for your aftercare needs so that they can reassess your need for medications and monitor your lab values.    The results of significant diagnostics from this hospitalization (including imaging, microbiology, ancillary and laboratory) are listed below for reference.    Significant Diagnostic Studies:  Dg Chest 2 View  Result Date: 12/31/2017 CLINICAL DATA:  Syncopal episode. EXAM: CHEST  2 VIEW  COMPARISON:  12/08/2017 CXR and chest CT FINDINGS: Top-normal size heart. Moderate aortic atherosclerosis. Left-sided pacemaker apparatus with right atrial and right ventricular leads. No acute pneumonic consolidation. No acute displaced rib fracture. No significant effusion. No pneumothorax. IMPRESSION: No active cardiopulmonary disease.  Aortic atherosclerosis. Electronically Signed   By: Tollie Eth M.D.   On: 12/31/2017 23:29   Ct Head Wo Contrast  Result Date: 12/31/2017 CLINICAL DATA:  84 year old female with syncope. EXAM: CT HEAD WITHOUT CONTRAST TECHNIQUE: Contiguous axial images were obtained from the base of the skull through the vertex without intravenous contrast. COMPARISON:  CT dated 12/11/2017 FINDINGS: Brain: There is moderate generalized atrophy. An area of old infarct and encephalomalacia noted in the left occipital lobe. There is no  acute intracranial hemorrhage. No mass effect or midline shift stable prominence of bilateral extra-axial spaces with low attenuating fluid which may represent old subdural hematoma or hygroma and measure up to 15 mm over the right frontal lobe. Vascular: No hyperdense vessel or unexpected calcification. Skull: Normal. Negative for fracture or focal lesion. Sinuses/Orbits: Mild mucoperiosteal thickening of the left sphenoid sinus. No air-fluid level the mastoid air cells are clear. Other: None IMPRESSION: 1. No acute intracranial hemorrhage. 2. Age-related atrophy and chronic microvascular ischemic changes. Old left occipital infarct. 3. Dilated extra-axial spaces as seen on the prior CT likely representing bilateral subdural hygromas or chronic subdural hematoma no midline shift. Electronically Signed   By: Elgie Collard M.D.   On: 12/31/2017 23:47      Microbiology: No results found for this or any previous visit (from the past 240 hour(s)).   Labs: Basic Metabolic Panel: Recent Labs  Lab 12/31/17 2148 01/01/18 0520 01/01/18 1125  NA 141 139 138  K 3.6 3.8 4.2  CL 105 107 108  CO2 24 24 21*  GLUCOSE 118* 103* 89  BUN 29* 29* 27*  CREATININE 1.59* 1.32* 1.16*  CALCIUM 9.7 9.0 8.7*   Liver Function Tests: No results for input(s): AST, ALT, ALKPHOS, BILITOT, PROT, ALBUMIN in the last 168 hours. No results for input(s): LIPASE, AMYLASE in the last 168 hours. No results for input(s): AMMONIA in the last 168 hours. CBC: Recent Labs  Lab 12/31/17 2148 01/01/18 0520  WBC 7.0 5.6  HGB 13.3 12.2  HCT 40.9 37.1  MCV 92.3 92.3  PLT 170 151   Cardiac Enzymes: Recent Labs  Lab 01/01/18 0520  TROPONINI <0.03   BNP: BNP (last 3 results) No results for input(s): BNP in the last 8760 hours.  ProBNP (last 3 results) No results for input(s): PROBNP in the last 8760 hours.  CBG: Recent Labs  Lab 12/31/17 2153  GLUCAP 106*       Signed:  Ike Bene, MD, [place],  [facility]. [facility] Pager [phone]  If 7PM-7AM, please contact night-coverage [email or web address] Password [facility] 01/01/2018, 3:07 PM

## 2018-01-14 ENCOUNTER — Telehealth: Payer: Self-pay | Admitting: Internal Medicine

## 2018-01-14 NOTE — Telephone Encounter (Signed)
I spoke with pt who reports shortness of breath with exertion. Off and on for several weeks.  Seems to be worse the last 3 days. She weighs daily and this is stable.  Weight today is down one lb. No swelling in feet or ankles. Had visit with home health nurse today.  Pt has seen Dr. Judithe Modest in [place] and tests were ordered but have not been done yet. Pt is not sure about follow up. Has never seen pulmonary. Pt has been scheduled to see Dr. [name] on 3/12.  I advised her to keep this appointment but to go to ED if symptoms worsen prior to this appointment.

## 2018-01-14 NOTE — Telephone Encounter (Signed)
Pt c/o Shortness Of Breath: STAT if SOB developed within the last 24 hours or pt is noticeably SOB on the phone  1. Are you currently SOB (can you hear that pt is SOB on the phone)? No  2. How long have you been experiencing SOB? 2 days  3. Are you SOB when sitting or when up moving around? walking  4. Are you currently experiencing any other symptoms? BP 160/90  [name] pt made appt 3-12 next day here -wants appt today-nothing available-

## 2018-01-18 ENCOUNTER — Encounter: Payer: Self-pay | Admitting: Internal Medicine

## 2018-01-18 ENCOUNTER — Emergency Department (HOSPITAL_COMMUNITY): Payer: Medicare HMO

## 2018-01-18 ENCOUNTER — Emergency Department (HOSPITAL_COMMUNITY)
Admission: EM | Admit: 2018-01-18 | Discharge: 2018-01-18 | Disposition: A | Discharge status: Home / Self Care | Payer: Medicare HMO | Attending: Emergency Medicine | Admitting: Emergency Medicine

## 2018-01-18 ENCOUNTER — Encounter (HOSPITAL_COMMUNITY): Payer: Self-pay | Admitting: Emergency Medicine

## 2018-01-18 ENCOUNTER — Ambulatory Visit (INDEPENDENT_AMBULATORY_CARE_PROVIDER_SITE_OTHER): Payer: Medicare HMO | Admitting: Internal Medicine

## 2018-01-18 VITALS — BP 170/94 | HR 77 | Ht 66.0 in | Wt 142.0 lb

## 2018-01-18 DIAGNOSIS — I442 Atrioventricular block, complete: Secondary | ICD-10-CM | POA: Diagnosis not present

## 2018-01-18 DIAGNOSIS — J449 Chronic obstructive pulmonary disease, unspecified: Secondary | ICD-10-CM | POA: Diagnosis not present

## 2018-01-18 DIAGNOSIS — R5383 Other fatigue: Secondary | ICD-10-CM | POA: Diagnosis not present

## 2018-01-18 DIAGNOSIS — Z7901 Long term (current) use of anticoagulants: Secondary | ICD-10-CM | POA: Diagnosis not present

## 2018-01-18 DIAGNOSIS — I10 Essential (primary) hypertension: Secondary | ICD-10-CM | POA: Insufficient documentation

## 2018-01-18 DIAGNOSIS — Z79899 Other long term (current) drug therapy: Secondary | ICD-10-CM | POA: Diagnosis not present

## 2018-01-18 DIAGNOSIS — Z8673 Personal history of transient ischemic attack (TIA), and cerebral infarction without residual deficits: Secondary | ICD-10-CM | POA: Diagnosis not present

## 2018-01-18 DIAGNOSIS — R531 Weakness: Secondary | ICD-10-CM | POA: Diagnosis present

## 2018-01-18 DIAGNOSIS — Z96651 Presence of right artificial knee joint: Secondary | ICD-10-CM | POA: Insufficient documentation

## 2018-01-18 DIAGNOSIS — Z950 Presence of cardiac pacemaker: Secondary | ICD-10-CM | POA: Diagnosis not present

## 2018-01-18 DIAGNOSIS — R0602 Shortness of breath: Secondary | ICD-10-CM | POA: Insufficient documentation

## 2018-01-18 LAB — COMPREHENSIVE METABOLIC PANEL
ALK PHOS: 49 U/L (ref 38–126)
ALT: 12 U/L — AB (ref 14–54)
AST: 25 U/L (ref 15–41)
Albumin: 3.9 g/dL (ref 3.5–5.0)
Anion gap: 10 (ref 5–15)
BUN: 15 mg/dL (ref 6–20)
CALCIUM: 9.6 mg/dL (ref 8.9–10.3)
CO2: 25 mmol/L (ref 22–32)
CREATININE: 1.08 mg/dL — AB (ref 0.44–1.00)
Chloride: 105 mmol/L (ref 101–111)
GFR calc non Af Amer: 44 mL/min — ABNORMAL LOW (ref >60)
GFR, EST AFRICAN AMERICAN: 50 mL/min — AB (ref >60)
GLUCOSE: 90 mg/dL (ref 65–99)
Potassium: 4.2 mmol/L (ref 3.5–5.1)
SODIUM: 140 mmol/L (ref 135–145)
Total Bilirubin: 0.5 mg/dL (ref 0.3–1.2)
Total Protein: 6.3 g/dL — ABNORMAL LOW (ref 6.5–8.1)

## 2018-01-18 LAB — URINALYSIS, ROUTINE W REFLEX MICROSCOPIC
BACTERIA UA: NONE SEEN
BILIRUBIN URINE: NEGATIVE
Glucose, UA: NEGATIVE mg/dL
KETONES UR: NEGATIVE mg/dL
Leukocytes, UA: NEGATIVE
NITRITE: NEGATIVE
Protein, ur: NEGATIVE mg/dL
Specific Gravity, Urine: 1.009 (ref 1.005–1.030)
pH: 7 (ref 5.0–8.0)

## 2018-01-18 LAB — CBC WITH DIFFERENTIAL/PLATELET
Basophils Absolute: 0 10*3/uL (ref 0.0–0.1)
Basophils Relative: 1 %
EOS ABS: 0.1 10*3/uL (ref 0.0–0.7)
Eosinophils Relative: 1 %
HCT: 43.1 % (ref 36.0–46.0)
HEMOGLOBIN: 14.4 g/dL (ref 12.0–15.0)
LYMPHS ABS: 1.6 10*3/uL (ref 0.7–4.0)
Lymphocytes Relative: 33 %
MCH: 31.2 pg (ref 26.0–34.0)
MCHC: 33.4 g/dL (ref 30.0–36.0)
MCV: 93.3 fL (ref 78.0–100.0)
Monocytes Absolute: 0.3 10*3/uL (ref 0.1–1.0)
Monocytes Relative: 6 %
NEUTROS ABS: 2.9 10*3/uL (ref 1.7–7.7)
NEUTROS PCT: 59 %
Platelets: UNDETERMINED 10*3/uL (ref 150–400)
RBC: 4.62 MIL/uL (ref 3.87–5.11)
RDW: 13.9 % (ref 11.5–15.5)
WBC: 4.9 10*3/uL (ref 4.0–10.5)

## 2018-01-18 LAB — CUP PACEART INCLINIC DEVICE CHECK
Implantable Lead Implant Date: 20160705
Implantable Lead Location: 753859
Implantable Lead Location: 753860
Implantable Lead Model: 5076
Implantable Lead Model: 5076
MDC IDC LEAD IMPLANT DT: 20160705
MDC IDC PG IMPLANT DT: 20160705
MDC IDC SESS DTM: 20190312163940

## 2018-01-18 LAB — D-DIMER, QUANTITATIVE: D-Dimer, Quant: 1.57 ug/mL-FEU — ABNORMAL HIGH (ref 0.00–0.50)

## 2018-01-18 LAB — I-STAT TROPONIN, ED: Troponin i, poc: 0.01 ng/mL (ref 0.00–0.08)

## 2018-01-18 LAB — BRAIN NATRIURETIC PEPTIDE: B Natriuretic Peptide: 40.5 pg/mL (ref 0.0–100.0)

## 2018-01-18 MED ORDER — IOPAMIDOL (ISOVUE-370) INJECTION 76%
INTRAVENOUS | Status: AC
  Administered 2018-01-18: 80 mL
  Filled 2018-01-18: qty 100

## 2018-01-18 NOTE — ED Notes (Signed)
Pt verbalizes understanding of d/c instructions. Pt ambulatory at d/c with all belongings and with family.   

## 2018-01-18 NOTE — ED Notes (Signed)
Patient transported to CT 

## 2018-01-18 NOTE — Patient Instructions (Addendum)
Medication Instructions:  Your physician recommends that you continue on your current medications as directed. Please refer to the Current Medication list given to you today.  Labwork: None ordered.  Testing/Procedures: None ordered.  Follow-Up: Your physician recommends that you visit the Emergency Room today.  Remote monitoring is used to monitor your Pacemaker from home. This monitoring reduces the number of office visits required to check your device to one time per year. It allows [place] to keep an eye on the functioning of your device to ensure it is working properly. You are scheduled for a device check from home on 02/09/2018. You may send your transmission at any time that day. If you have a wireless device, the transmission will be sent automatically. After your physician reviews your transmission, you will receive a postcard with your next transmission date.    Any Other Special Instructions Will Be Listed Below (If Applicable).     If you need a refill on your cardiac medications before your next appointment, please call your pharmacy.   Report called to [facility] ER Triage RN.

## 2018-01-18 NOTE — Progress Notes (Signed)
Patient Care Team: Algis Greenhouse, MD as PCP - General (Unknown Physician Specialty) Elsie Stain, MD as Attending Physician (Pulmonary Disease)   HPI  [name] [name] is a 84 y.o. female Seen in follow-up for palpitations with documented nonsustained atrial arrhythmias and nonsustained ventricular tachycardia. She has underlying conduction disease with PR interval of 300-400 ms.  She had not been seen over the year until she presented 7/16 to [facility] with weakness and found to have complete heart block. Following bisoprolol washout, it persisted and she underwent dual-chamber pacing.  She had a stroke 3/13 associated with a cerebral hemorrhage. Device detection has demonstrated atrial high rates episodes up to 15 minutes with rates greater than 3-400.   She had had another stroke associated with left-sided hemiparesis 3/17. She was hospitalized at [facility] in [place]. The records were reviewed as below. She has recovered exceedingly well from her deficits. She is back living at home.  She was treated with aspirin 325. After lengthy discussions outlined in the note 9/17 we started her on apixoban. She was not able to tolerate because of nausea.  Most recently she has been managed with rivaroxaban since she presented to hospital 1/19 to have a peroneal DVT   she had been hospitalized at that time for generalized weakness and was found to have a UTI.  Seen in ER 2/19 for syncope while on the commode this was presumed vasovagal  She has continued to have problems with weakness and increasing dyspnea.  She has difficulty standing.  Catheterization 2011 without obstructive diseas   DATE TEST EF   7/17 MYOVIEW  65 % NO ischemia  3/17 Echo   65 % 2+ TR  1/19 Echo  50-55%    Date Cr K Hgb  2/19 1.16 4.2 12.2             CHADS-VASc score is greater than or equal to 6  Past Medical History:  Diagnosis Date  . Arthritis   . Bronchiectasis, non-tuberculous  ([place])    a. Followed by Dr. Joya Gaskins.  . Colon polyps   . Complete heart block (HCC)    a. s/p MDT dual chamber PPM   . COPD (chronic obstructive pulmonary disease) ([place])    past hx with pneumonia  . Diverticulosis   . DVT (deep venous thrombosis) (HCC)    Resolved, no anticoagulation currently  . GERD (gastroesophageal reflux disease)   . Headache   . High cholesterol   . Hypertension   . IBS (irritable bowel syndrome)   . 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 MI (myocardial infarction) ?? probably not 1975   a. 1975 Reported MI;  b. 2011 Cath: nonobs dzs;  c. 2013 Nonischemic Myoview;  d. 12/2012 Echo: EF 60-65%, Gr1 DD.  . Nephrolithiasis   . Palpitation    a. 2010 Event Monitor: PACs and PVCs  . Pneumonia   . PONV (postoperative nausea and vomiting)   . Post-dural puncture headache 09/01/2017  . Presence of permanent cardiac pacemaker   . Pulmonary fibrosis ([place])   . Stroke ([place])   . Wears dentures    full  . Wide-complex tachycardia ([place])    a. Followed by Dr. Caryl Comes, "I think this is VT, but i am not sure QRSd 135 1:1 AV."    Past Surgical History:  Procedure Laterality Date  . APPENDECTOMY    . BLADDER SUSPENSION     tack  . CARDIAC CATHETERIZATION Right 05/14/2015  Procedure: Temporary Pacemaker;  Surgeon: Thompson Grayer, MD;  Location: [facility];  Service: Cardiovascular;  Laterality: Right;  . CHOLECYSTECTOMY    . COLON SURGERY     12inches colectomy s/p diverticulitis  . COLONOSCOPY W/ BIOPSIES AND POLYPECTOMY    . EP IMPLANTABLE DEVICE N/A 05/14/2015   Procedure: Pacemaker Implant;  Surgeon: Thompson Grayer, MD; [place] 1 (serial number [id number])     . IR FL GUIDED LOC OF NEEDLE/CATH TIP FOR SPINAL INJECTION LT  09/06/2017  . KNEE ARTHROSCOPY Left 03/29/2017   Procedure: ARTHROSCOPY LEFT KNEE WITH PARTIAL MEDIAL AND PARTIAL LATERAL MENISCECTOMY;  Surgeon: Vickey Huger, MD;  Location: [facility];  Service: Orthopedics;   Laterality: Left;  Marland Kitchen MULTIPLE TOOTH EXTRACTIONS    . TONSILLECTOMY    . TOTAL ABDOMINAL HYSTERECTOMY    . TOTAL KNEE ARTHROPLASTY  12/12/2012   Procedure: TOTAL KNEE ARTHROPLASTY;  Surgeon: Alta Corning, MD;  Location: [facility];  Service: Orthopedics;  Laterality: Right;  right total knee arthroplasty    Current Outpatient Medications  Medication Sig Dispense Refill  . rivaroxaban (XARELTO) 20 MG TABS tablet Take 20 mg by mouth daily with supper.    . vitamin B-12 (CYANOCOBALAMIN) 1000 MCG tablet Take 1,000 mcg by mouth daily.     No current facility-administered medications for this visit.     Allergies  Allergen Reactions  . Crestor [Rosuvastatin] Other (See Comments)    Myalgia  . Darvon Other (See Comments)    Drop in  BP  . Meloxicam Other (See Comments)    Dizziness   . Meperidine Other (See Comments)    Hypotension  . Prednisone Diarrhea    POLYURIA  . Valsartan Other (See Comments)     Hypotension  . Amiodarone Other (See Comments)    Did not tolerate - unknown reaction  . Aspirin-Acetaminophen-Caffeine Other (See Comments)    Unknown reaction  . Nitroglycerin     Profound hypotension with sublingual nitroglycerin.   . Tramadol Other (See Comments)    Unknown reaction      Review of Systems negative except from HPI and PMH  Physical Exam BP (!) 170/94   Pulse 77   Ht _0  (1.676 m)   Wt 142 lb (64.4 kg)   SpO2 96%   BMI 22.92 kg/m  Well developed and nourished appearing weak and tired HENT normal Neck supple with JVP-flat Clear Regular rate and rhythm, no murmurs or gallops Abd-soft with active BS No Clubbing cyanosis edema Skin-warm and dry A & Oriented  Grossly normal sensory and motor function   ECG sinus with PACs and P-synchronous/ AV  pacing   Assessment and  Plan  Complete Heart Block  Sinus node dysfunction\  Pacemaker  Medtronic  The patient's device was interrogated and the information was fully reviewed.  The device was  reprogrammed to maximize longevity  Hypertension   Atrial fibrillation/tachycardia  Stroke  Peroneal DVT on Rivaroxaban   Weakness    Progressive weakness.  This a couple of months ago was associated with an occult UTI.  She is currently hardly ambulatory.  We will send her back to the emergency room for further evaluation.  She has been started on anticoagulation.  I am not quite sure of the indication for anticoagulation in the setting of a peroneal DVT.  Device function is normal  Blood pressure poorly controlled but she is acutely ill.  No interval atrial fibrillation of note.

## 2018-01-18 NOTE — ED Notes (Signed)
Patient transported to X-ray 

## 2018-01-18 NOTE — ED Notes (Signed)
Pt back from x-ray.

## 2018-01-18 NOTE — Discharge Instructions (Signed)
Return to the ED with any concerns including fainting, chest pain, difficulty breathing, vomiting and not able to keep down liquids, decreased level of alertness/lethargy, or any other alarming symptoms °

## 2018-01-18 NOTE — ED Provider Notes (Signed)
[facility] EMERGENCY DEPARTMENT Provider Note   CSN: [id number] Arrival date & time: 01/18/18  1453     History   Chief Complaint Chief Complaint  Patient presents with  . Shortness of Breath    HPI [name] [name] is a 84 y.o. female.  HPI  Pt with history of atrial fibrillation, complete heart block s/p pacemaker placement (Medtronic), HTN, GERD, kidney stones, DVT (negative doppler 11/28/17), HLD, CVA, IBS who presents with generalized weakness and some shortness of breath with exertion.  She states symptoms have been ongoing for several months.  She was treated for a UTI and since then has been feeling fatigued with her daily activities.  She was seen in cardiology office today and referred to the ED for further evaluation.  She has been receiving home physical therapy but today states she was not able to participate.  She denies chest pain.  She has no leg swelling.  She was diagnosed with a peroneal DVT and started on anticoagulation recently.  She has not had any fevers or chills.  No vomiting and diarrhea.  She has been eating and drinking normally.  There are no other associated systemic symptoms, there are no other alleviating or modifying factors.   Past Medical History:  Diagnosis Date  . Arthritis   . Bronchiectasis, non-tuberculous ([place])    a. Followed by Dr. Joya Gaskins.  . Colon polyps   . Complete heart block (HCC)    a. s/p MDT dual chamber PPM   . COPD (chronic obstructive pulmonary disease) ([place])    past hx with pneumonia  . Diverticulosis   . DVT (deep venous thrombosis) (HCC)    Resolved, no anticoagulation currently  . GERD (gastroesophageal reflux disease)   . Headache   . High cholesterol   . Hypertension   . IBS (irritable bowel syndrome)   . 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 MI (myocardial infarction) ?? probably not 1975   a. 1975 Reported MI;  b. 2011 Cath: nonobs dzs;  c. 2013 Nonischemic Myoview;  d. 12/2012  Echo: EF 60-65%, Gr1 DD.  . Nephrolithiasis   . Palpitation    a. 2010 Event Monitor: PACs and PVCs  . Pneumonia   . PONV (postoperative nausea and vomiting)   . Post-dural puncture headache 09/01/2017  . Presence of permanent cardiac pacemaker   . Pulmonary fibrosis ([place])   . Stroke ([place])   . Wears dentures    full  . Wide-complex tachycardia ([place])    a. Followed by Dr. Caryl Comes, "I think this is VT, but i am not sure QRSd 135 1:1 AV."    Patient Active Problem List   Diagnosis Date Noted  . AKI (acute kidney injury) ([place]) 01/01/2018  . Orthostatic syncope 01/01/2018  . Weakness generalized 11/25/2017  . Post-dural puncture headache 09/01/2017  . Chest pain with low risk for cardiac etiology 11/18/2015  . Pacemaker   . Bradycardia with 31 - 40 beats per minute 05/14/2015  . Heart block   . Pain in limb 08/16/2013  . Varicose veins of lower extremities with other complications 70/11/7492  . Edema -lower extremity 06/22/2013  . Osteoarthritis of right knee 12/12/2012  . History of pulmonary embolism 08/30/2012  . Fatigue 08/19/2012  . Wide-complex tachycardia ([place]) 05/30/2012  . Bronchiectasis, non-tuberculous ([place]) 01/06/2012  . 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 Diverticulosis   . High cholesterol   . Coronary artery disease prior MI   . Hypertension  Past Surgical History:  Procedure Laterality Date  . APPENDECTOMY    . BLADDER SUSPENSION     tack  . CARDIAC CATHETERIZATION Right 05/14/2015   Procedure: Temporary Pacemaker;  Surgeon: Thompson Grayer, MD;  Location: [facility];  Service: Cardiovascular;  Laterality: Right;  . CHOLECYSTECTOMY    . COLON SURGERY     12inches colectomy s/p diverticulitis  . COLONOSCOPY W/ BIOPSIES AND POLYPECTOMY    . EP IMPLANTABLE DEVICE N/A 05/14/2015   Procedure: Pacemaker Implant;  Surgeon: Thompson Grayer, MD; [place] 1 (serial number [id number])     . IR FL GUIDED LOC OF  NEEDLE/CATH TIP FOR SPINAL INJECTION LT  09/06/2017  . KNEE ARTHROSCOPY Left 03/29/2017   Procedure: ARTHROSCOPY LEFT KNEE WITH PARTIAL MEDIAL AND PARTIAL LATERAL MENISCECTOMY;  Surgeon: Vickey Huger, MD;  Location: [facility];  Service: Orthopedics;  Laterality: Left;  Marland Kitchen MULTIPLE TOOTH EXTRACTIONS    . TONSILLECTOMY    . TOTAL ABDOMINAL HYSTERECTOMY    . TOTAL KNEE ARTHROPLASTY  12/12/2012   Procedure: TOTAL KNEE ARTHROPLASTY;  Surgeon: Alta Corning, MD;  Location: [facility];  Service: Orthopedics;  Laterality: Right;  right total knee arthroplasty    OB History    No data available       Home Medications    Prior to Admission medications   Medication Sig Start Date End Date Taking? Authorizing Provider  rivaroxaban (XARELTO) 20 MG TABS tablet Take 20 mg by mouth daily with supper.    [provider]  vitamin B-12 (CYANOCOBALAMIN) 1000 MCG tablet Take 1,000 mcg by mouth daily.    [provider]    Family History Family History  Problem Relation Age of Onset  . Heart disease Father   . Heart attack Mother   . Stomach cancer Brother   . Other Son        [place]    Social History Social History   Tobacco Use  . Smoking status: Never Smoker  . Smokeless tobacco: Never Used  Substance Use Topics  . Alcohol use: No  . Drug use: No     Allergies   Crestor [rosuvastatin]; Darvon; Meloxicam; Meperidine; Prednisone; Valsartan; Amiodarone; Aspirin-acetaminophen-caffeine; Nitroglycerin; and Tramadol   Review of Systems Review of Systems  ROS reviewed and all otherwise negative except for mentioned in HPI   Physical Exam Updated Vital Signs BP (!) 153/63 (BP Location: Left Arm)   Pulse 85   Temp 97.9 F (36.6 C) (Oral)   Resp 19   SpO2 95%  Vitals reviewed Physical Exam  Physical Examination: General appearance - alert, well appearing, and in no distress Mental status - alert, oriented to person, place, and time Eyes - no conjunctival injection, no  scleral icterus Mouth - mucous membranes moist, pharynx normal without lesions Neck - supple, no significant adenopathy Chest - clear to auscultation, no wheezes, rales or rhonchi, symmetric air entry Heart - normal rate, regular rhythm, normal S1, S2, no murmurs, rubs, clicks or gallops Abdomen - soft, nontender, nondistended, no masses or organomegaly Neurological - alert, oriented, normal speech,, strength 5/5 in extremities x 3, 4/5 in left lower extremity- chronic from prior stroke Extremities - peripheral pulses normal, no pedal edema, no clubbing or cyanosis Skin - normal coloration and turgor, no rashes   ED Treatments / Results  Labs (all labs ordered are listed, but only abnormal results are displayed) Labs Reviewed  COMPREHENSIVE METABOLIC PANEL - Abnormal; Notable for the  following components:      Result Value   Creatinine, Ser 1.08 (*)    Total Protein 6.3 (*)    ALT 12 (*)    GFR calc non Af Amer 44 (*)    GFR calc Af Amer 50 (*)    All other components within normal limits  D-DIMER, QUANTITATIVE (NOT AT [employer]) - Abnormal; Notable for the following components:   D-Dimer, Quant 1.57 (*)    All other components within normal limits  URINALYSIS, ROUTINE W REFLEX MICROSCOPIC - Abnormal; Notable for the following components:   Color, Urine STRAW (*)    Hgb urine dipstick SMALL (*)    Squamous Epithelial / LPF 0-5 (*)    All other components within normal limits  CBC WITH DIFFERENTIAL/PLATELET  BRAIN NATRIURETIC PEPTIDE  CBC WITH DIFFERENTIAL/PLATELET  I-STAT TROPONIN, ED    EKG  EKG Interpretation  Date/Time:  Tuesday January 18 2018 15:49:30 EDT Ventricular Rate:  72 PR Interval:  186 QRS Duration: 158 QT Interval:  462 QTC Calculation: 505 R Axis:   -105 Text Interpretation:  AV dual-paced rhythm Abnormal ECG Since previous tracing paced rhythm now evident Confirmed by Alfonzo Beers [phone]) on 01/18/2018 4:03:00 PM       Radiology Dg Chest 2  View  Result Date: 01/18/2018 CLINICAL DATA:  84 year old female with shortness of breath and weakness for 3 weeks. Lower extremity blood clot diagnosed 3 weeks ago. EXAM: CHEST - 2 VIEW COMPARISON:  Chest radiographs 12/31/2017 and earlier. FINDINGS: Upright AP and lateral views of the chest. Stable cardiomegaly and mediastinal contours. Stable left chest cardiac pacemaker. Calcified aortic atherosclerosis. Stable lung volumes. Chronic middle lobe bronchiectasis and atelectasis better demonstrated by CTA on 12/08/2017. No pneumothorax, pulmonary edema, pleural effusion or acute pulmonary opacity identified. No acute osseous abnormality identified. Partially visible ventral abdominal hernia repair with mesh. IMPRESSION: 1.  No acute cardiopulmonary abnormality. 2. Chronic cardiomegaly and Aortic Atherosclerosis (ICD10-I70.0). 3. Chronic bilateral middle lobe Bronchiectasis and atelectasis. Electronically Signed   By: Genevie Ann M.D.   On: 01/18/2018 17:33   Ct Angio Chest Pe W And/or Wo Contrast  Result Date: 01/18/2018 CLINICAL DATA:  Shortness of breath, history of deep venous thrombosis. EXAM: CT ANGIOGRAPHY CHEST WITH CONTRAST TECHNIQUE: Multidetector CT imaging of the chest was performed using the standard protocol during bolus administration of intravenous contrast. Multiplanar CT image reconstructions and MIPs were obtained to evaluate the vascular anatomy. CONTRAST:  80 mL Isovue 370. COMPARISON:  CT from 12/08/2017 FINDINGS: Cardiovascular: Calcifications of the thoracic aorta are noted without significant aneurysmal dilatation. Opacification is limited due to the timing of the contrast bolus.Pulmonary artery demonstrates a normal branching pattern. No filling defect to suggest pulmonary embolus is noted. Coronary calcifications are noted. Mild cardiomegaly is noted. Mediastinum/Nodes: The esophagus is within normal limits. No significant hilar or mediastinal adenopathy is noted. The thoracic inlet  demonstrates a lower pole left thyroid nodule smaller than that seen on the prior exam likely representing a regressing nodule. Lungs/Pleura: Emphysematous changes are noted. Some mild tree-in-bud changes are again identified within the right lung particularly in the lower lobe. Some scarring in the right middle lobe is again seen and stable. Stable scarring in the lingula is noted as well. No new focal infiltrate is seen. Upper Abdomen: Visualized upper abdomen is within normal limits. Previously seen renal lesion is not included on this exam. Musculoskeletal: Osseous structures show degenerative change of the thoracic spine. Review of the MIP images confirms the above  findings. IMPRESSION: No evidence of pulmonary emboli. Stable bilateral scarring in the lungs. Stable tree-in-bud changes within the right lung. Regressing nodule in the left lobe of the thyroid. Aortic Atherosclerosis (ICD10-I70.0) and Emphysema (ICD10-J43.9). Electronically Signed   By: Inez Catalina M.D.   On: 01/18/2018 20:43    Procedures Procedures (including critical care time)  Medications Ordered in ED Medications  iopamidol (ISOVUE-370) 76 % injection (80 mLs  Contrast Given 01/18/18 2003)     Initial Impression / Assessment and Plan / ED Course  I have reviewed the triage vital signs and the nursing notes.  Pertinent labs & imaging results that were available during my care of the patient were reviewed by me and considered in my medical decision making (see chart for details).     Patient presenting with complaint of generalized weakness.  Her workup in the ED tonight was reassuring.  Chest x-ray and CT angios of chest were reassuring with no PE or pneumonia.  No findings consistent with UTI on urinalysis.  Her blood work is reassuring.  I have discussed all results with patient and family at the bedside and advised her to continue with home physical therapy and close follow-up with primary care doctor.  Final Clinical  Impressions(s) / ED Diagnoses   Final diagnoses:  Generalized weakness    ED Discharge Orders    None       Pixie Casino, MD 01/18/18 2228

## 2018-01-18 NOTE — ED Triage Notes (Signed)
Pt at MD office with complaints of shortness of breath and dizziness. HX DVT so MD sent them in for further evaluation. She has been feeling dizzy since yesterday. Has pacemaker. No chest pain or headache. Walks with assistance. Hx previous strokes and left leg weakness.

## 2018-02-09 ENCOUNTER — Telehealth: Payer: Self-pay | Admitting: Cardiology

## 2018-02-09 ENCOUNTER — Ambulatory Visit (INDEPENDENT_AMBULATORY_CARE_PROVIDER_SITE_OTHER): Payer: Medicare HMO | Admitting: *Deleted

## 2018-02-09 DIAGNOSIS — I442 Atrioventricular block, complete: Secondary | ICD-10-CM | POA: Diagnosis not present

## 2018-02-09 NOTE — Telephone Encounter (Signed)
Spoke with pt and reminded pt of remote transmission that is due today. Pt verbalized understanding.   

## 2018-02-09 NOTE — Telephone Encounter (Signed)
Confirmed remote transmission w/ pt. Pt stated that she has been more short of breath than usual. instructed pt how to send a manual transmission. Informed nurse of shortness of breath. Nurse looked at remote transmission. Call routed to Device Tech RN

## 2018-02-09 NOTE — Telephone Encounter (Signed)
Patient reports that she has noticed an increase in her ShOB over the past month. Patient successfully sent a manual transmission which shows an increase in her Atrial Arrhythmia Trend over the month of March. Patient confirmed that she is taking her xarelto and states that she has an appointment with her cardiologist on 4/5 and will talk with him about her ShOB and AF at that point. I additionally offered an appointment with the AF clinic which patient has declined at this time.

## 2018-02-09 NOTE — Progress Notes (Signed)
Remote pacemaker transmission.   

## 2018-02-10 ENCOUNTER — Encounter: Payer: Self-pay | Admitting: Cardiology

## 2018-02-11 ENCOUNTER — Ambulatory Visit: Payer: Medicare HMO | Admitting: Cardiology

## 2018-02-11 ENCOUNTER — Encounter: Payer: Self-pay | Admitting: Cardiology

## 2018-02-11 VITALS — BP 192/96 | HR 62 | Ht 66.0 in | Wt 140.0 lb

## 2018-02-11 DIAGNOSIS — I119 Hypertensive heart disease without heart failure: Secondary | ICD-10-CM

## 2018-02-11 DIAGNOSIS — Z950 Presence of cardiac pacemaker: Secondary | ICD-10-CM

## 2018-02-11 DIAGNOSIS — I480 Paroxysmal atrial fibrillation: Secondary | ICD-10-CM | POA: Diagnosis not present

## 2018-02-11 DIAGNOSIS — I25118 Atherosclerotic heart disease of native coronary artery with other forms of angina pectoris: Secondary | ICD-10-CM

## 2018-02-11 DIAGNOSIS — I5032 Chronic diastolic (congestive) heart failure: Secondary | ICD-10-CM | POA: Insufficient documentation

## 2018-02-11 DIAGNOSIS — N2889 Other specified disorders of kidney and ureter: Secondary | ICD-10-CM | POA: Diagnosis not present

## 2018-02-11 DIAGNOSIS — I459 Conduction disorder, unspecified: Secondary | ICD-10-CM

## 2018-02-11 DIAGNOSIS — I951 Orthostatic hypotension: Secondary | ICD-10-CM | POA: Insufficient documentation

## 2018-02-11 DIAGNOSIS — I442 Atrioventricular block, complete: Secondary | ICD-10-CM | POA: Diagnosis not present

## 2018-02-11 DIAGNOSIS — Z7901 Long term (current) use of anticoagulants: Secondary | ICD-10-CM | POA: Insufficient documentation

## 2018-02-11 MED ORDER — FUROSEMIDE 20 MG PO TABS: 20.0000 mg | ORAL_TABLET | ORAL | 3 refills | Status: DC

## 2018-02-11 NOTE — Patient Instructions (Addendum)
Medication Instructions:  Your physician has recommended you make the following change in your medication:  START furosemide (Lasix) 20 mg three times weekly. Monday, Wednesday, Friday  Check blood pressure sitting and standing daily. Keep log and bring to your next office visit.  Labwork: None  Testing/Procedures: You will have a renal (kidney) ultrasound at [facility].   Follow-Up: Your physician recommends that you schedule a follow-up appointment in: 4 weeks.  Any Other Special Instructions Will Be Listed Below (If Applicable).     If you need a refill on your cardiac medications before your next appointment, please call your pharmacy.

## 2018-02-11 NOTE — Progress Notes (Signed)
Cardiology Office Note:    Date:  02/11/2018   ID:  [name] [name], DOB June 12, 1924, MRN [id number]  PCP:  Algis Greenhouse, MD  Cardiologist:  Shirlee More, MD   Referring MD: Algis Greenhouse, MD  ASSESSMENT:    1. Paroxysmal atrial fibrillation (HCC)   2. Presence of cardiac pacemaker   3. Hypertensive heart disease without heart failure   4. Chronic anticoagulation   5. Heart block   6. Complete atrioventricular block (HCC)   7. Orthostatic hypotension   8. Coronary artery disease of native artery of native heart with stable angina pectoris ([place])   9. Chronic diastolic heart failure ([place])   10. Renal mass, right   11. Kidney mass    PLAN:    In order of problems listed above:  1. Stable no clinical recurrence off antiarrhythmic drug and she will continue her current anticoagulant and will continue to monitor her rhythm through her device and pacemaker clinic 2. Mild nonobstructive asymptomatic I told her that I do not think she requires another myocardial perfusion study 3. Stable function I have reviewed her recent device clinic managed by Dr. Caryl Comes 4. Blood pressure is elevated running at home 564-332 systolic she has edema decompensated heart failure and asked her to restart a low-dose of a loop diuretic 3 days/week 5. Stable with pacemaker 6. Stable with pacemaker 7. No orthostatic shift today avoid vasodilators at this time 8. Duplex ordered with concerns on CT scan in January raising the question of neoplasia.  This may be responsible for her profound weakness.  Next appointment one month   Medication Adjustments/Labs and Tests Ordered: Current medicines are reviewed at length with the patient today.  Concerns regarding medicines are outlined above.  Orders Placed This Encounter  Procedures  . US Renal   Meds ordered this encounter  Medications  . furosemide (LASIX) 20 MG tablet    Sig: Take 1 tablet (20 mg total) by mouth 3 (three) times a week.    Dispense:   40 tablet    Refill:  3     Chief Complaint  Patient presents with  . New Patient (Initial Visit)    per [facility] to evaluate weakness.  Pt also has c/o SHOB  . Atrial Fibrillation  . Congestive Heart Failure  . Leg Swelling    History of Present Illness:    [name] [name] is a 84 y.o. female with a history of complete heart block status post permanent pacemaker, prior CVA, chronic back pain and atrial tachycardia. msot recently seen by [facility] in [place]  She was admitted to [facility] in February: Admit date: 12/31/2017 Discharge date: 01/01/2018 Recommendations for Outpatient Follow-up:  1. Stop all blood pressures including amlodipine and lisinopril 2. Stop lasix 3. Follow up blood pressures with primary care doctor 4. Also stopping gabapentin (although pt reports not taking at home)  Discharge Diagnoses:  Principal Problem:   Orthostatic syncope Active Problems:   Hypertension   History of pulmonary embolism   Heart block   AKI (acute kidney injury) ([place])  History of present illness:  [name] Yim Bowersis a 84 y.o.femalewithhistory of complete heart block status post pacemaker placement, DVT, history of stroke, atrial tachycardia, on Xarelto, diastolic CHF, recently admitted for UTI/possible pneumonia last month was brought to the ER after patient had a syncopal episode. Patient was on the bathroom commode and she passed out. She was moving her bowels. Family noticed that patient was unresponsive and had to  move her to the floor. She will regain consciousness after 5 minutes. Patient does not recall incident. He was called and patient was brought to the ER.  Hospital Course:  # Orthostatic hypotension causing syncope and AKI. Resting BP were actually borderline hypertensive 140-150s/90s however+orthotatic vitals with BP drop to 120s/80s. Hx of DVT but already on xarelto.Administered total 1L fluids. Cr improved from 1.59 to 1.16 with fluids. Patient able to  ambulate after fluids without orthostatic sx. Review of dual PPM device check shows device is working appropriately. Atrial tach (known afib) sensed but does not impact ventricular rate which has been stable in 60s. TTE previously done in 11/2017, thus not repeated. Discontinued home diuretics and stop BP meds on discharge. Will continue on home xarelto.   She was seen by Dr Caryl Comes for device 01/18/18: [name] [name] is a 84 y.o. female Seen in follow-up for palpitations with documented nonsustained atrial arrhythmias and nonsustained ventricular tachycardia. She has underlying conduction disease with PR interval of 300-400 ms.  She had not been seen over the year until she presented 7/16 to [facility] with weakness and found to have complete heart block. Following bisoprolol washout, it persisted and she underwent dual-chamber pacing.  She had a stroke 3/13 associated with a cerebral hemorrhage. Device detection has demonstrated atrial high rates episodes up to 15 minutes with rates greater than 3-400.   She had had another stroke associated with left-sided hemiparesis 3/17. She was hospitalized at [facility] in [place]. The records were reviewed as below. She has recovered exceedingly well from her deficits. She is back living at home.  She was treated with aspirin 325. After lengthy discussions outlined in the note 9/17 we started her on apixoban. She was not able to tolerate because of nausea.  Most recently she has been managed with rivaroxaban since she presented to hospital 1/19 to have a peroneal DVT   she had been hospitalized at that time for generalized weakness and was found to have a UTI.  Seen in ER 2/19 for syncope while on the commode this was presumed vasovagal  She has continued to have problems with weakness and increasing dyspnea.  She has difficulty standing.  Catheterization 2011 without obstructive diseas Assessment and  Plan  Complete Heart Block  Sinus node  dysfunction\  Pacemaker  [employer]  The patient's device was interrogated and the information was fully reviewed.  The device was reprogrammed to maximize longevity  Hypertension   Atrial fibrillation/tachycardia  Stroke  Peroneal DVT on Rivaroxaban   Weakness Progressive weakness.  This a couple of months ago was associated with an occult UTI.  She is currently hardly ambulatory.  We will send her back to the emergency room for further evaluation.  She has been started on anticoagulation.  I am not quite sure of the indication for anticoagulation in the setting of a peroneal DVT.  Device function is normal  Blood pressure poorly controlled but she is acutely ill.  No interval atrial fibrillation of note.    who is being seen today for the evaluation of weakness and heart disease at the request of Dough, Jaymes Graff, MD.  This is a very complex interaction took in excess of 31 minutes to review all of her medical records.  She tells me I had seen her greater than 5 years ago in the past for atrial fibrillation but no records are available.  Overall she feels increasingly weak and is disappointed that she has not improved with the  pacemaker.  Because of worsening renal function and symptomatic hypotension her antihypertensive medications were discontinued diuretic stopped but recently with orthopnea and edema she began to take furosemide as needed.  She is not depressed she has had no syncope or chest pain she has no exertional shortness of breath but has edema and orthopnea.  She has had no palpitation or clinical bleeding on her anticoagulant.  When I reviewed her records she had an enlarging renal mass on a CT scan at [facility] mention was made about cancer and I ordered a duplex of the kidney to follow-up on this. Past Medical History:  Diagnosis Date  . Acute deep vein thrombosis (DVT) of right peroneal vein ([place]) 11/30/2017   Overview:  0762: uncomplicated, xarelto    . AKI (acute kidney injury) ([place]) 01/01/2018  . Arthritis   . Atrial fibrillation ([place]) 02/08/2016  . Benign essential HTN 01/15/2016  . Bradycardia with 31 - 40 beats per minute 05/14/2015  . Bronchiectasis, non-tuberculous ([place])    a. Followed by Dr. Joya Gaskins.  . Cerebrovascular accident (CVA) due to thrombosis of right middle cerebral artery ([place]) 02/07/2016   Overview:  2017: R-MCA thrombosis with left non-dom hemiparesis, thalamic capsule ischemia 2018: recurrent  . Chest pain with low risk for cardiac etiology 11/18/2015  . Chronic kidney disease, stage III (moderate) ([place]) 01/15/2016  . Chronic pain of left knee 12/07/2016  . Closed compression fracture of fourth lumbar vertebra ([place]) 01/19/2017   Overview:  2015  . Closed fracture of ramus of left pubis ([place]) 06/18/2017   Overview:  2018  . Colon polyps   . Complete atrioventricular block ([place]) 01/15/2016   Overview:  2016: PM  . Complete heart block ([place])    a. s/p MDT dual chamber PPM   . COPD (chronic obstructive pulmonary disease) ([place])    past hx with pneumonia  . Coronary artery disease prior MI   . Diverticulosis   . DVT (deep venous thrombosis) (HCC)    Resolved, no anticoagulation currently  . Dyspnea on exertion 10/12/2017   Overview:  2018  . Edema -lower extremity 06/22/2013  . Fatigue 08/19/2012  . GERD (gastroesophageal reflux disease)   . Hammer toe of left foot 06/07/2017  . Headache   . Heart block   . Hemiparesis of left nondominant side as late effect of cerebral infarction ([place]) 12/22/2016  . High cholesterol   . History of pulmonary embolism 08/30/2012   PE 2013  >>repeat CT Angio chest 12/2013>>>NO PE   . Hypertension   . IBS (irritable bowel syndrome)   . Left foot pain 06/07/2017  . Left leg pain 08/03/2016   Overview:  2017: related to weakness left leg from stroke  . Lumbar radiculopathy 07/27/2017  . 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 Malaise and fatigue 10/27/2017  . MI (myocardial  infarction) ?? probably not 1975   a. 1975 Reported MI;  b. 2011 Cath: nonobs dzs;  c. 2013 Nonischemic Myoview;  d. 12/2012 Echo: EF 60-65%, Gr1 DD.  . Nephrolithiasis   . Orthostatic syncope 01/01/2018  . Osteoarthritis of right knee 12/12/2012  . Osteoporosis 08/01/2016  . Pain in limb 08/16/2013  . Pain of left femur 06/17/2017   Overview:  2018  . Palpitation    a. 2010 Event Monitor: PACs and PVCs  . Panlobular emphysema ([place]) 01/15/2016  . Paroxysmal supraventricular tachycardia ([place]) 01/15/2016  . Pneumonia   . PONV (postoperative nausea and vomiting)   . Post-dural puncture headache 09/01/2017  .  Presence of cardiac pacemaker 01/16/2016   July 5,2016 Overview:  2016: complete AVB  . Presence of permanent cardiac pacemaker   . Pulmonary fibrosis ([employer])   . S/P left knee arthroscopy 04/01/2017  . S/P TKR (total knee replacement) using cement, right 12/07/2016  . Stroke ([place])   . Varicose veins of lower extremities with other complications 78/07/3809  . Vitamin D deficiency 08/01/2016  . Weakness generalized 11/25/2017  . Wears dentures    full  . Wide-complex tachycardia ([place])    a. Followed by Dr. Caryl Comes, "I think this is VT, but i am not sure QRSd 135 1:1 AV."    Past Surgical History:  Procedure Laterality Date  . APPENDECTOMY    . BLADDER SUSPENSION     tack  . CARDIAC CATHETERIZATION Right 05/14/2015   Procedure: Temporary Pacemaker;  Surgeon: Thompson Grayer, MD;  Location: [facility];  Service: Cardiovascular;  Laterality: Right;  . CHOLECYSTECTOMY    . COLON SURGERY     12inches colectomy s/p diverticulitis  . COLONOSCOPY W/ BIOPSIES AND POLYPECTOMY    . EP IMPLANTABLE DEVICE N/A 05/14/2015   Procedure: Pacemaker Implant;  Surgeon: Thompson Grayer, MD; [place] 1 (serial number [id number])     . IR FL GUIDED LOC OF NEEDLE/CATH TIP FOR SPINAL INJECTION LT  09/06/2017  . KNEE ARTHROSCOPY Left 03/29/2017   Procedure: ARTHROSCOPY LEFT KNEE WITH PARTIAL MEDIAL  AND PARTIAL LATERAL MENISCECTOMY;  Surgeon: Vickey Huger, MD;  Location: [facility];  Service: Orthopedics;  Laterality: Left;  Mar[place] Kitchen MULTIPLE TOOTH EXTRACTIONS    . TONSILLECTOMY    . TOTAL ABDOMINAL HYSTERECTOMY    . TOTAL KNEE ARTHROPLASTY  12/12/2012   Procedure: TOTAL KNEE ARTHROPLASTY;  Surgeon: Alta Corning, MD;  Location: [facility];  Service: Orthopedics;  Laterality: Right;  right total knee arthroplasty    Current Medications: Current Meds  Medication Sig  . vitamin B-12 (CYANOCOBALAMIN) 1000 MCG tablet Take 1,000 mcg by mouth daily.  Alveda Reasons 15 MG TABS tablet TAKE 1 TABLET (15 MG TOTAL) BY MOUTH DAILY. STROKE PREVENTION, BLOOD THINNER     Allergies:   Crestor [rosuvastatin]; Darvon; Meloxicam; Meperidine; Prednisone; Valsartan; Amiodarone; Aspirin-acetaminophen-caffeine; Nitroglycerin; and Tramadol   Social History   Socioeconomic History  . Marital status: Married    Spouse name: Not on file  . Number of children: 3  . Years of education: Not on file  . Highest education level: Not on file  Occupational History  . Occupation: Retired    Comment: Musician  Social Needs  . Financial resource strain: Not on file  . Food insecurity:    Worry: Not on file    Inability: Not on file  . Transportation needs:    Medical: Not on file    Non-medical: Not on file  Tobacco Use  . Smoking status: Never Smoker  . Smokeless tobacco: Never Used  Substance and Sexual Activity  . Alcohol use: No  . Drug use: No  . Sexual activity: Not on file  Lifestyle  . Physical activity:    Days per week: Not on file    Minutes per session: Not on file  . Stress: Not on file  Relationships  . Social connections:    Talks on phone: Not on file    Gets together: Not on file    Attends religious service: Not on file    Active member of club or organization: Not on file  Attends meetings of clubs or organizations: Not on file    Relationship status: Not on file  Other Topics Concern   . Not on file  Social History Narrative  . Not on file     Family History: The patient's family history includes Heart attack in her mother; Heart disease in her father; Other in her son; Stomach cancer in her brother.  ROS:   Review of Systems  Constitution: Positive for malaise/fatigue and weight gain.  HENT: Negative.   Eyes: Negative.   Cardiovascular: Positive for dyspnea on exertion, leg swelling and orthopnea.  Respiratory: Positive for shortness of breath.   Endocrine: Positive for cold intolerance.  Hematologic/Lymphatic: Negative.   Skin: Negative.   Musculoskeletal: Negative.   Gastrointestinal: Negative.   Genitourinary: Negative.   Neurological: Negative.   Psychiatric/Behavioral: Negative.   Allergic/Immunologic: Negative.    Please see the history of present illness.     All other systems reviewed and are negative.  EKGs/Labs/Other Studies Reviewed:    The following studies were reviewed today:   CTA head with stable bilateral subdural hematomas and L occipital PCA infarct CTA chest 11/11/17 with 1.4 cm right renal mass /bronchiectasis and bud opacities in lung Echo TTE 11/26/17:  - Low normal LVF with EF 50-55% with septal lateral wall   dyssynchrony due to RV pacing, mild TR and MR, grade 1 DD> The   findings indicate significant septal-lateral left ventricular   wall dyssynchrony due to RV pacing MPI 05/27/16: IMPRESSION: 1. No reversible ischemia or infarction. 2. Normal left ventricular wall motion. 3. Left ventricular ejection fraction 70% Recent Labs:  01/05/28  BMP normal, CBC normal TSH was normal in January 11/26/2017: TSH 1.298 01/18/2018: ALT 12; B Natriuretic Peptide 40.5; BUN 15; Creatinine, Ser 1.08; Hemoglobin 14.4; Platelets PLATELET CLUMPS NOTED ON SMEAR, UNABLE TO ESTIMATE; Potassium 4.2; Sodium 140  Recent Lipid Panel    Component Value Date/Time   CHOL 187 09/03/2017 1000   TRIG 71 09/03/2017 1000   HDL 64 09/03/2017 1000    CHOLHDL 2.9 09/03/2017 1000   VLDL 14 09/03/2017 1000   LDLCALC 109 (H) 09/03/2017 1000    Physical Exam:    VS:  BP (!) 192/96 (BP Location: Left Arm, Patient Position: Sitting, Cuff Size: Normal)   Pulse 62   Ht _0  (1.676 m)   Wt 140 lb (63.5 kg)   SpO2 98%   BMI 22.60 kg/m     Wt Readings from Last 3 Encounters:  02/11/18 140 lb (63.5 kg)  01/18/18 142 lb (64.4 kg)  12/31/17 137 lb (62.1 kg)     GEN: She has not a look acutely ill well nourished, well developed in no acute distress HEENT: Normal NECK: No JVD; No carotid bruits LYMPHATICS: No lymphadenopathy CARDIAC: No murmur or gallop RRR, no murmurs, rubs, gallops RESPIRATORY:  Clear to auscultation without rales, wheezing or rhonchi  ABDOMEN: Soft, non-tender, non-distended MUSCULOSKELETAL: 2-3+ edema to the knees edema; No deformity  SKIN: Warm and dry NEUROLOGIC:  Alert and oriented x 3 PSYCHIATRIC:  Normal affect     Signed, Shirlee More, MD  02/11/2018 12:04 PM    [place]

## 2018-02-15 LAB — CUP PACEART REMOTE DEVICE CHECK
Battery Impedance: 181 Ohm
Battery Remaining Longevity: 115 mo
Battery Voltage: 2.79 V
Brady Statistic AP VP Percent: 39 %
Brady Statistic AS VP Percent: 61 %
Brady Statistic AS VS Percent: 0 %
Implantable Lead Implant Date: 20160705
Implantable Lead Implant Date: 20160705
Implantable Lead Location: 753859
Implantable Lead Model: 5076
Implantable Pulse Generator Implant Date: 20160705
Lead Channel Impedance Value: 516 Ohm
Lead Channel Impedance Value: 571 Ohm
Lead Channel Setting Pacing Amplitude: 2.5 V
Lead Channel Setting Pacing Pulse Width: 0.4 ms
Lead Channel Setting Sensing Sensitivity: 4 mV
MDC IDC LEAD LOCATION: 753860
MDC IDC MSMT LEADCHNL RA PACING THRESHOLD AMPLITUDE: 0.5 V
MDC IDC MSMT LEADCHNL RA PACING THRESHOLD PULSEWIDTH: 0.4 ms
MDC IDC MSMT LEADCHNL RV PACING THRESHOLD AMPLITUDE: 0.5 V
MDC IDC MSMT LEADCHNL RV PACING THRESHOLD PULSEWIDTH: 0.4 ms
MDC IDC SESS DTM: 20190403183240
MDC IDC SET LEADCHNL RA PACING AMPLITUDE: 2 V
MDC IDC STAT BRADY AP VS PERCENT: 0 %

## 2018-02-16 ENCOUNTER — Telehealth: Payer: Self-pay

## 2018-02-16 ENCOUNTER — Other Ambulatory Visit: Payer: Self-pay

## 2018-02-16 DIAGNOSIS — N2889 Other specified disorders of kidney and ureter: Secondary | ICD-10-CM

## 2018-02-16 NOTE — Telephone Encounter (Signed)
Patient notified of normal Renal U/S results per Dr Dulce Sellar.  Copy of results faxed to Dr Sol Passer.  Patient verbalized understanding.

## 2018-03-06 NOTE — Progress Notes (Signed)
Cardiology Office Note:    Date:  03/07/2018   ID:  [name] [name], DOB 01/12/1924, MRN [id number]  PCP:  Algis Greenhouse, MD  Cardiologist:  Shirlee More, MD    Referring MD: Algis Greenhouse, MD    ASSESSMENT:    1. PAT (paroxysmal atrial tachycardia) ([name] [name])   2. Paroxysmal atrial fibrillation ([name] [name])   3. Hypertensive heart disease without heart failure   4. Presence of cardiac pacemaker   5. Kidney mass   6. Orthostatic hypotension    PLAN:    In order of problems listed above:  1. She has a high supraventricular ectopic burden I will start her on a rate suppressant calcium channel blocker watching closely for symptomatic orthostatic hypotension.  Reassess in the office in 1 month reinterrogate her device 2. Rate limiting calcium channel blocker started continue her anticoagulant 3. Stable compensated continue minimal dose of diuretic 4. See below having high rate atrial activity device interrogated with the engineer 5. Not present on recent duplex 6. Stable asymptomatic   Next appointment: One month   Medication Adjustments/Labs and Tests Ordered: Current medicines are reviewed at length with the patient today.  Concerns regarding medicines are outlined above.  No orders of the defined types were placed in this encounter.  Meds ordered this encounter  Medications  . verapamil (CALAN-SR) 240 MG CR tablet    Sig: Take 1 tablet (240 mg total) by mouth at bedtime.    Dispense:  30 tablet    Refill:  11    Chief Complaint  Patient presents with  . Follow-up    History of Present Illness:    [name] [name] is a 84 y.o. female with a hx of complete heart block status post permanent pacemaker, prior CVA, VTE, orthostatic hypotension and renal mass last seen 02/11/18 She is unimproved and complains of profound weakness and fatigue she is aware of her heart beating irregularly and rapidly.  She asked whether her arrhythmia or pacemaker is the cause of her weakness.  No  chest pain shortness of breath or syncope Home blood pressure is 1 130-150/70-80.  ASSESSMENT: 02/11/18   1. Paroxysmal atrial fibrillation ([name] [name])   2. Presence of cardiac pacemaker   3. Hypertensive heart disease without heart failure   4. Chronic anticoagulation   5. Heart block   6. Complete atrioventricular block ([name] [name])   7. Orthostatic hypotension   8. Coronary artery disease of native artery of native heart with stable angina pectoris ([name] [name])   9. Chronic diastolic heart failure ([name] [name])   10. Renal mass, right   11. Kidney mass     1.   Stable no clinical recurrence off antiarrhythmic drug and she will continue her current anticoagulant and will continue to monitor her rhythm through her device and pacemaker clinic 7. Mild nonobstructive asymptomatic I told her that I do not think she requires another myocardial perfusion study 8. Stable function I have reviewed her recent device clinic managed by Dr. Caryl Comes 9. Blood pressure is elevated running at home 619-509 systolic she has edema decompensated heart failure and asked her to restart a low-dose of a loop diuretic 3 days/week 10. Stable with pacemaker 11. Stable with pacemaker 12. No orthostatic shift today avoid vasodilators at this time 13. Duplex ordered with concerns on CT scan in January raising the question of neoplasia.  This may be responsible for her profound weakness.  Past Medical History:  Diagnosis Date  . Acute deep vein thrombosis (DVT) of  right peroneal vein ([name] [name]) 11/30/2017   Overview:  7902: uncomplicated, xarelto  . AKI (acute kidney injury) ([place]) 01/01/2018  . Arthritis   . Atrial fibrillation ([name] [name]) 02/08/2016  . Benign essential HTN 01/15/2016  . Bradycardia with 31 - 40 beats per minute 05/14/2015  . Bronchiectasis, non-tuberculous ([place])    a. Followed by Dr. Joya Gaskins.  . Cerebrovascular accident (CVA) due to thrombosis of right middle cerebral artery ([name] [name]) 02/07/2016   Overview:  2017: R-MCA thrombosis with left  non-dom hemiparesis, thalamic capsule ischemia 2018: recurrent  . Chest pain with low risk for cardiac etiology 11/18/2015  . Chronic kidney disease, stage III (moderate) ([place]) 01/15/2016  . Chronic pain of left knee 12/07/2016  . Closed compression fracture of fourth lumbar vertebra ([place]) 01/19/2017   Overview:  2015  . Closed fracture of ramus of left pubis ([name] [name]) 06/18/2017   Overview:  2018  . Colon polyps   . Complete atrioventricular block ([name] [name]) 01/15/2016   Overview:  2016: PM  . Complete heart block ([name] [name])    a. s/p MDT dual chamber PPM   . COPD (chronic obstructive pulmonary disease) ([name] [name])    past hx with pneumonia  . Coronary artery disease prior MI   . Diverticulosis   . DVT (deep venous thrombosis) ([name] [name])    Resolved, no anticoagulation currently  . Dyspnea on exertion 10/12/2017   Overview:  2018  . Edema -lower extremity 06/22/2013  . Fatigue 08/19/2012  . GERD (gastroesophageal reflux disease)   . Hammer toe of left foot 06/07/2017  . Headache   . Heart block   . Hemiparesis of left nondominant side as late effect of cerebral infarction ([name] [name]) 12/22/2016  . High cholesterol   . History of pulmonary embolism 08/30/2012   PE 2013  >>repeat CT Angio chest 12/2013>>>NO PE   . Hypertension   . IBS (irritable bowel syndrome)   . Left foot pain 06/07/2017  . Left leg pain 08/03/2016   Overview:  2017: related to weakness left leg from stroke  . Lumbar radiculopathy 07/27/2017  . 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 Malaise and fatigue 10/27/2017  . MI (myocardial infarction) ?? probably not 1975   a. 1975 Reported MI;  b. 2011 Cath: nonobs dzs;  c. 2013 Nonischemic Myoview;  d. 12/2012 Echo: EF 60-65%, Gr1 DD.  . Nephrolithiasis   . Orthostatic syncope 01/01/2018  . Osteoarthritis of right knee 12/12/2012  . Osteoporosis 08/01/2016  . Pain in limb 08/16/2013  . Pain of left femur 06/17/2017   Overview:  2018  . Palpitation    a. 2010 Event Monitor: PACs and PVCs  .  Panlobular emphysema ([name] [name]) 01/15/2016  . Paroxysmal supraventricular tachycardia ([name] [name]) 01/15/2016  . Pneumonia   . PONV (postoperative nausea and vomiting)   . Post-dural puncture headache 09/01/2017  . Presence of cardiac pacemaker 01/16/2016   July 5,2016 Overview:  2016: complete AVB  . Presence of permanent cardiac pacemaker   . Pulmonary fibrosis ([place])   . S/P left knee arthroscopy 04/01/2017  . S/P TKR (total knee replacement) using cement, right 12/07/2016  . Stroke ([place])   . Varicose veins of lower extremities with other complications 40/07/7352  . Vitamin D deficiency 08/01/2016  . Weakness generalized 11/25/2017  . Wears dentures    full  . Wide-complex tachycardia ([place])    a. Followed by Dr. Caryl Comes, "I think this is VT, but i am not sure QRSd 135 1:1 AV."    Past Surgical History:  Procedure  Laterality Date  . APPENDECTOMY    . BLADDER SUSPENSION     tack  . CARDIAC CATHETERIZATION Right 05/14/2015   Procedure: Temporary Pacemaker;  Surgeon: Thompson Grayer, MD;  Location: [facility];  Service: Cardiovascular;  Laterality: Right;  . CHOLECYSTECTOMY    . COLON SURGERY     12inches colectomy s/p diverticulitis  . COLONOSCOPY W/ BIOPSIES AND POLYPECTOMY    . EP IMPLANTABLE DEVICE N/A 05/14/2015   Procedure: Pacemaker Implant;  Surgeon: Thompson Grayer, MD; [place] 1 (serial number [id number])     . IR FL GUIDED LOC OF NEEDLE/CATH TIP FOR SPINAL INJECTION LT  09/06/2017  . KNEE ARTHROSCOPY Left 03/29/2017   Procedure: ARTHROSCOPY LEFT KNEE WITH PARTIAL MEDIAL AND PARTIAL LATERAL MENISCECTOMY;  Surgeon: Vickey Huger, MD;  Location: [facility];  Service: Orthopedics;  Laterality: Left;  [place] Kitchen MULTIPLE TOOTH EXTRACTIONS    . TONSILLECTOMY    . TOTAL ABDOMINAL HYSTERECTOMY    . TOTAL KNEE ARTHROPLASTY  12/12/2012   Procedure: TOTAL KNEE ARTHROPLASTY;  Surgeon: Alta Corning, MD;  Location: [facility];  Service: Orthopedics;  Laterality: Right;  right total knee arthroplasty     Current Medications: Current Meds  Medication Sig  . furosemide (LASIX) 20 MG tablet Take 1 tablet (20 mg total) by mouth 3 (three) times a week.  . vitamin B-12 (CYANOCOBALAMIN) 1000 MCG tablet Take 1,000 mcg by mouth daily.  Alveda Reasons 15 MG TABS tablet TAKE 1 TABLET (15 MG TOTAL) BY MOUTH DAILY. STROKE PREVENTION, BLOOD THINNER     Allergies:   Crestor [rosuvastatin]; Darvon; Meloxicam; Meperidine; Prednisone; Valsartan; Amiodarone; Aspirin-acetaminophen-caffeine; Nitroglycerin; and Tramadol   Social History   Socioeconomic History  . Marital status: Married    Spouse name: Not on file  . Number of children: 3  . Years of education: Not on file  . Highest education level: Not on file  Occupational History  . Occupation: Retired    Comment: Musician  Social Needs  . Financial resource strain: Not on file  . Food insecurity:    Worry: Not on file    Inability: Not on file  . Transportation needs:    Medical: Not on file    Non-medical: Not on file  Tobacco Use  . Smoking status: Never Smoker  . Smokeless tobacco: Never Used  Substance and Sexual Activity  . Alcohol use: No  . Drug use: No  . Sexual activity: Not on file  Lifestyle  . Physical activity:    Days per week: Not on file    Minutes per session: Not on file  . Stress: Not on file  Relationships  . Social connections:    Talks on phone: Not on file    Gets together: Not on file    Attends religious service: Not on file    Active member of club or organization: Not on file    Attends meetings of clubs or organizations: Not on file    Relationship status: Not on file  Other Topics Concern  . Not on file  Social History Narrative  . Not on file     Family History: The patient's family history includes Heart attack in her mother; Heart disease in her father; Other in her son; Stomach cancer in her brother. ROS:   Please see the history of present illness.    All other systems reviewed  and are negative.  EKGs/Labs/Other Studies Reviewed:    The following studies  were reviewed today: Pacemaker is interrogated along with engineer from the company she is good threshold she is in an atrial paced rhythm today she is having frequent high rate activity 8.8% of the time August episode 19 minutes consistent with ectopic atrial tachycardia  Recent Labs: 11/26/2017: TSH 1.298 01/18/2018: ALT 12; B Natriuretic Peptide 40.5; BUN 15; Creatinine, Ser 1.08; Hemoglobin 14.4; Platelets PLATELET CLUMPS NOTED ON SMEAR, UNABLE TO ESTIMATE; Potassium 4.2; Sodium 140  Recent Lipid Panel    Component Value Date/Time   CHOL 187 09/03/2017 1000   TRIG 71 09/03/2017 1000   HDL 64 09/03/2017 1000   CHOLHDL 2.9 09/03/2017 1000   VLDL 14 09/03/2017 1000   LDLCALC 109 (H) 09/03/2017 1000    Physical Exam:    VS:  BP (!) 174/82 (BP Location: Right Arm, Patient Position: Sitting, Cuff Size: Normal)   Pulse 66   Ht _0  (1.676 m)   Wt 139 lb 12.8 oz (63.4 kg)   SpO2 96%   BMI 22.56 kg/m     Wt Readings from Last 3 Encounters:  03/07/18 139 lb 12.8 oz (63.4 kg)  02/11/18 140 lb (63.5 kg)  01/18/18 142 lb (64.4 kg)     GEN: She looks quite frail chronically ill  in no acute distress HEENT: Normal NECK: No JVD; No carotid bruits LYMPHATICS: No lymphadenopathy CARDIAC: RRR, no murmurs, rubs, gallops RESPIRATORY:  Clear to auscultation without rales, wheezing or rhonchi  ABDOMEN: Soft, non-tender, non-distended MUSCULOSKELETAL:  No edema; No deformity  SKIN: Warm and dry NEUROLOGIC:  Alert and oriented x 3 PSYCHIATRIC:  Normal affect    Signed, Shirlee More, MD  03/07/2018 12:54 PM    [place]

## 2018-03-07 ENCOUNTER — Ambulatory Visit: Payer: Medicare HMO | Admitting: Cardiology

## 2018-03-07 ENCOUNTER — Telehealth: Payer: Self-pay | Admitting: Cardiology

## 2018-03-07 ENCOUNTER — Encounter: Payer: Self-pay | Admitting: Cardiology

## 2018-03-07 VITALS — BP 174/82 | HR 66 | Ht 66.0 in | Wt 139.8 lb

## 2018-03-07 DIAGNOSIS — N2889 Other specified disorders of kidney and ureter: Secondary | ICD-10-CM | POA: Diagnosis not present

## 2018-03-07 DIAGNOSIS — Z950 Presence of cardiac pacemaker: Secondary | ICD-10-CM | POA: Diagnosis not present

## 2018-03-07 DIAGNOSIS — I480 Paroxysmal atrial fibrillation: Secondary | ICD-10-CM

## 2018-03-07 DIAGNOSIS — I119 Hypertensive heart disease without heart failure: Secondary | ICD-10-CM | POA: Diagnosis not present

## 2018-03-07 DIAGNOSIS — I951 Orthostatic hypotension: Secondary | ICD-10-CM

## 2018-03-07 DIAGNOSIS — I471 Supraventricular tachycardia: Secondary | ICD-10-CM

## 2018-03-07 MED ORDER — VERAPAMIL HCL ER 240 MG PO TBCR: 240.0000 mg | EXTENDED_RELEASE_TABLET | Freq: Every day | ORAL | 11 refills | Status: DC

## 2018-03-07 NOTE — Patient Instructions (Signed)
Medication Instructions:  Your physician has recommended you make the following change in your medication:  START verapamil (Calan-SR) 240 mg daily at bedtime  Labwork: None  Testing/Procedures: None  Follow-Up: Your physician recommends that you schedule a follow-up appointment in: 1 month.  Any Other Special Instructions Will Be Listed Below (If Applicable).     If you need a refill on your cardiac medications before your next appointment, please call your pharmacy.

## 2018-03-07 NOTE — Telephone Encounter (Signed)
Fax received of device check done by industry at Dr. Hulen Shouts request.

## 2018-03-07 NOTE — Telephone Encounter (Signed)
Dr. Dulce Sellar wants to know if pt is in atrial fib. Told MD nurse to have pt send a remote transmission and we will call back w/ results.

## 2018-03-08 ENCOUNTER — Encounter: Payer: Self-pay | Admitting: Internal Medicine

## 2018-03-11 ENCOUNTER — Ambulatory Visit: Payer: Medicare HMO | Admitting: Cardiology

## 2018-03-15 ENCOUNTER — Observation Stay (HOSPITAL_COMMUNITY): Payer: Medicare HMO

## 2018-03-15 ENCOUNTER — Emergency Department (HOSPITAL_COMMUNITY): Payer: Medicare HMO

## 2018-03-15 ENCOUNTER — Encounter (HOSPITAL_COMMUNITY): Payer: Self-pay | Admitting: Emergency Medicine

## 2018-03-15 ENCOUNTER — Observation Stay (HOSPITAL_COMMUNITY)
Admission: EM | Admit: 2018-03-15 | Discharge: 2018-03-18 | Disposition: A | Discharge status: Home / Self Care | Payer: Medicare HMO | Attending: Internal Medicine | Admitting: Internal Medicine

## 2018-03-15 DIAGNOSIS — N183 Chronic kidney disease, stage 3 unspecified: Secondary | ICD-10-CM | POA: Diagnosis present

## 2018-03-15 DIAGNOSIS — Z7901 Long term (current) use of anticoagulants: Secondary | ICD-10-CM

## 2018-03-15 DIAGNOSIS — I130 Hypertensive heart and chronic kidney disease with heart failure and stage 1 through stage 4 chronic kidney disease, or unspecified chronic kidney disease: Secondary | ICD-10-CM | POA: Diagnosis not present

## 2018-03-15 DIAGNOSIS — Z86718 Personal history of other venous thrombosis and embolism: Secondary | ICD-10-CM

## 2018-03-15 DIAGNOSIS — I2510 Atherosclerotic heart disease of native coronary artery without angina pectoris: Secondary | ICD-10-CM | POA: Diagnosis not present

## 2018-03-15 DIAGNOSIS — E785 Hyperlipidemia, unspecified: Secondary | ICD-10-CM | POA: Insufficient documentation

## 2018-03-15 DIAGNOSIS — J449 Chronic obstructive pulmonary disease, unspecified: Secondary | ICD-10-CM | POA: Insufficient documentation

## 2018-03-15 DIAGNOSIS — Z79899 Other long term (current) drug therapy: Secondary | ICD-10-CM | POA: Insufficient documentation

## 2018-03-15 DIAGNOSIS — E7800 Pure hypercholesterolemia, unspecified: Secondary | ICD-10-CM | POA: Diagnosis present

## 2018-03-15 DIAGNOSIS — Z950 Presence of cardiac pacemaker: Secondary | ICD-10-CM | POA: Diagnosis not present

## 2018-03-15 DIAGNOSIS — R55 Syncope and collapse: Secondary | ICD-10-CM | POA: Diagnosis not present

## 2018-03-15 DIAGNOSIS — I119 Hypertensive heart disease without heart failure: Secondary | ICD-10-CM | POA: Diagnosis present

## 2018-03-15 DIAGNOSIS — I5032 Chronic diastolic (congestive) heart failure: Secondary | ICD-10-CM | POA: Diagnosis present

## 2018-03-15 DIAGNOSIS — I63311 Cerebral infarction due to thrombosis of right middle cerebral artery: Secondary | ICD-10-CM

## 2018-03-15 DIAGNOSIS — Z9049 Acquired absence of other specified parts of digestive tract: Secondary | ICD-10-CM | POA: Insufficient documentation

## 2018-03-15 DIAGNOSIS — Z96651 Presence of right artificial knee joint: Secondary | ICD-10-CM | POA: Insufficient documentation

## 2018-03-15 DIAGNOSIS — I252 Old myocardial infarction: Secondary | ICD-10-CM | POA: Diagnosis not present

## 2018-03-15 DIAGNOSIS — Z86711 Personal history of pulmonary embolism: Secondary | ICD-10-CM | POA: Diagnosis present

## 2018-03-15 DIAGNOSIS — W19XXXA Unspecified fall, initial encounter: Secondary | ICD-10-CM

## 2018-03-15 DIAGNOSIS — M25559 Pain in unspecified hip: Secondary | ICD-10-CM

## 2018-03-15 DIAGNOSIS — R42 Dizziness and giddiness: Secondary | ICD-10-CM | POA: Diagnosis present

## 2018-03-15 LAB — TSH: TSH: 1.008 u[IU]/mL (ref 0.350–4.500)

## 2018-03-15 LAB — CBC WITH DIFFERENTIAL/PLATELET
BASOS ABS: 0.1 10*3/uL (ref 0.0–0.1)
Basophils Relative: 1 %
EOS ABS: 0.2 10*3/uL (ref 0.0–0.7)
Eosinophils Relative: 4 %
HCT: 40 % (ref 36.0–46.0)
HEMOGLOBIN: 12.8 g/dL (ref 12.0–15.0)
LYMPHS ABS: 1.1 10*3/uL (ref 0.7–4.0)
LYMPHS PCT: 21 %
MCH: 29.8 pg (ref 26.0–34.0)
MCHC: 32 g/dL (ref 30.0–36.0)
MCV: 93 fL (ref 78.0–100.0)
MONOS PCT: 9 %
Monocytes Absolute: 0.5 10*3/uL (ref 0.1–1.0)
NEUTROS PCT: 65 %
Neutro Abs: 3.4 10*3/uL (ref 1.7–7.7)
Platelets: 164 10*3/uL (ref 150–400)
RBC: 4.3 MIL/uL (ref 3.87–5.11)
RDW: 14 % (ref 11.5–15.5)
WBC: 5.2 10*3/uL (ref 4.0–10.5)

## 2018-03-15 LAB — COMPREHENSIVE METABOLIC PANEL
ALK PHOS: 52 U/L (ref 38–126)
ALT: 40 U/L (ref 14–54)
AST: 37 U/L (ref 15–41)
Albumin: 3.5 g/dL (ref 3.5–5.0)
Anion gap: 9 (ref 5–15)
BUN: 20 mg/dL (ref 6–20)
CALCIUM: 9 mg/dL (ref 8.9–10.3)
CO2: 23 mmol/L (ref 22–32)
CREATININE: 1.26 mg/dL — AB (ref 0.44–1.00)
Chloride: 108 mmol/L (ref 101–111)
GFR calc Af Amer: 42 mL/min — ABNORMAL LOW (ref >60)
GFR calc non Af Amer: 36 mL/min — ABNORMAL LOW (ref >60)
Glucose, Bld: 92 mg/dL (ref 65–99)
Potassium: 4 mmol/L (ref 3.5–5.1)
SODIUM: 140 mmol/L (ref 135–145)
Total Bilirubin: 0.6 mg/dL (ref 0.3–1.2)
Total Protein: 6 g/dL — ABNORMAL LOW (ref 6.5–8.1)

## 2018-03-15 LAB — I-STAT TROPONIN, ED: Troponin i, poc: 0 ng/mL (ref 0.00–0.08)

## 2018-03-15 LAB — URINALYSIS, ROUTINE W REFLEX MICROSCOPIC
Bilirubin Urine: NEGATIVE
GLUCOSE, UA: NEGATIVE mg/dL
Hgb urine dipstick: NEGATIVE
Ketones, ur: NEGATIVE mg/dL
LEUKOCYTES UA: NEGATIVE
NITRITE: NEGATIVE
PH: 6 (ref 5.0–8.0)
Protein, ur: NEGATIVE mg/dL
Specific Gravity, Urine: 1.009 (ref 1.005–1.030)

## 2018-03-15 LAB — PROTIME-INR
INR: 1.21
Prothrombin Time: 15.2 seconds (ref 11.4–15.2)

## 2018-03-15 MED ORDER — ALUM & MAG HYDROXIDE-SIMETH 200-200-20 MG/5ML PO SUSP: 30.0000 mL | Freq: Four times a day (QID) | ORAL | Status: DC | PRN

## 2018-03-15 MED ORDER — SODIUM CHLORIDE 0.9 % IV BOLUS
500.0000 mL | Freq: Once | INTRAVENOUS | Status: AC
  Administered 2018-03-15: 500 mL via INTRAVENOUS

## 2018-03-15 MED ORDER — ACETAMINOPHEN 325 MG PO TABS
650.0000 mg | ORAL_TABLET | Freq: Once | ORAL | Status: AC
  Administered 2018-03-15: 650 mg via ORAL
  Filled 2018-03-15: qty 2

## 2018-03-15 MED ORDER — ONDANSETRON HCL 4 MG PO TABS: 4.0000 mg | ORAL_TABLET | Freq: Four times a day (QID) | ORAL | Status: DC | PRN

## 2018-03-15 MED ORDER — ONDANSETRON HCL 4 MG/2ML IJ SOLN: 4.0000 mg | Freq: Four times a day (QID) | INTRAMUSCULAR | Status: DC | PRN

## 2018-03-15 MED ORDER — HEPARIN SODIUM (PORCINE) 5000 UNIT/ML IJ SOLN: 5000.0000 [IU] | Freq: Three times a day (TID) | INTRAMUSCULAR | Status: DC

## 2018-03-15 MED ORDER — RIVAROXABAN 15 MG PO TABS
15.0000 mg | ORAL_TABLET | Freq: Every day | ORAL | Status: DC
Start: 2018-03-15 — End: 2018-03-18
  Administered 2018-03-16 – 2018-03-17 (×2): 15 mg via ORAL
  Filled 2018-03-15 (×2): qty 1

## 2018-03-15 MED ORDER — SODIUM CHLORIDE 0.9 % IV SOLN
INTRAVENOUS | Status: DC
  Administered 2018-03-15 – 2018-03-16 (×2): via INTRAVENOUS

## 2018-03-15 NOTE — ED Notes (Signed)
Admit provider stated not to administer Heparin until confirm with neurology.

## 2018-03-15 NOTE — ED Notes (Signed)
Spoke with MRI for estimated time for MRI.  Stated patient currently next however they have to complete the patient they currently have first.

## 2018-03-15 NOTE — ED Notes (Signed)
Admit provider at bedside 

## 2018-03-15 NOTE — ED Triage Notes (Signed)
Pt reports having an usual restless night where she did really sleep at all, pt states she got up at 0600 because she thought it was 0700 and she needed to wake her grand daughter up, pt states while walking to her grand daughters room she felt like she was walking into the wall. Pt then realized it wasn't quite as late as she thought so she went to the restroom. Pt was then on the toilet and felt unable to get up due to weakness, pt the even had a syncope event or fell off the commode and had a positive LOC she is not sure. Pt was hypotensive with ems. Pt has hx of stroke with left sided weakness.

## 2018-03-15 NOTE — ED Notes (Signed)
Spoke with family and MRI. Patient has a pacemaker and MRI will confirm if patient will be able to have a MRI.

## 2018-03-15 NOTE — ED Notes (Signed)
MRI called back patient unable to have an MRI.

## 2018-03-15 NOTE — ED Provider Notes (Signed)
[facility] EMERGENCY DEPARTMENT Provider Note   CSN: [id number] Arrival date & time: 03/15/18  9476     History   Chief Complaint Chief Complaint  Patient presents with  . Dizziness  . Fall    HPI [name] [name] is a 84 y.o. female hx of afib on xarelto, previous stroke with L sided weakness, COPD, here with dizziness, near syncope.  Patient is from home.  Patient woke up around 6 AM and was able to walk to the bathroom.  She sat on her commode and was able to urinate.  Subsequently she felt lightheaded and dizzy and called her family.  She denies any chest pain or shortness of breath at that time.  She felt like she was going to pass out.  EMS got there and she apparently leaned over to her side.  She was noted to be hypotensive with blood pressure in the 80s and was brought to the ED.  She denies any fevers or chills or cough.  Patient states that she has left-sided residual weakness from the previous stroke but slowly got worse this morning but denies any slurred speech or numbness.   The history is provided by the patient.    Past Medical History:  Diagnosis Date  . Acute deep vein thrombosis (DVT) of right peroneal vein ([place]) 11/30/2017   Overview:  5465: uncomplicated, xarelto  . AKI (acute kidney injury) ([place]) 01/01/2018  . Arthritis   . Atrial fibrillation ([place]) 02/08/2016  . Benign essential HTN 01/15/2016  . Bradycardia with 31 - 40 beats per minute 05/14/2015  . Bronchiectasis, non-tuberculous ([place])    a. Followed by Dr. Joya Gaskins.  . Cerebrovascular accident (CVA) due to thrombosis of right middle cerebral artery ([place]) 02/07/2016   Overview:  2017: R-MCA thrombosis with left non-dom hemiparesis, thalamic capsule ischemia 2018: recurrent  . Chest pain with low risk for cardiac etiology 11/18/2015  . Chronic kidney disease, stage III (moderate) ([place]) 01/15/2016  . Chronic pain of left knee 12/07/2016  . Closed compression fracture of fourth lumbar vertebra ([place])  01/19/2017   Overview:  2015  . Closed fracture of ramus of left pubis ([place]) 06/18/2017   Overview:  2018  . Colon polyps   . Complete atrioventricular block ([place]) 01/15/2016   Overview:  2016: PM  . Complete heart block ([place])    a. s/p MDT dual chamber PPM   . COPD (chronic obstructive pulmonary disease) ([place])    past hx with pneumonia  . Coronary artery disease prior MI   . Diverticulosis   . DVT (deep venous thrombosis) (H[facility])    Resolved, no anticoagulation currently  . Dyspnea on exertion 10/12/2017   Overview:  2018  . Edema -lower extremity 06/22/2013  . Fatigue 08/19/2012  . GERD (gastroesophageal reflux disease)   . Hammer toe of left foot 06/07/2017  . Headache   . Heart block   . Hemiparesis of left nondominant side as late effect of cerebral infarction ([place]) 12/22/2016  . High cholesterol   . History of pulmonary embolism 08/30/2012   PE 2013  >>repeat CT Angio chest 12/2013>>>NO PE   . Hypertension   . IBS (irritable bowel syndrome)   . Left foot pain 06/07/2017  . Left leg pain 08/03/2016   Overview:  2017: related to weakness left leg from stroke  . Lumbar radiculopathy 07/27/2017  . 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 Malaise and fatigue 10/27/2017  . MI (myocardial infarction) ?? probably not 1975  a. 1975 Reported MI;  b. 2011 Cath: nonobs dzs;  c. 2013 Nonischemic Myoview;  d. 12/2012 Echo: EF 60-65%, Gr1 DD.  . Nephrolithiasis   . Orthostatic syncope 01/01/2018  . Osteoarthritis of right knee 12/12/2012  . Osteoporosis 08/01/2016  . Pain in limb 08/16/2013  . Pain of left femur 06/17/2017   Overview:  2018  . Palpitation    a. 2010 Event Monitor: PACs and PVCs  . Panlobular emphysema ([place]) 01/15/2016  . Paroxysmal supraventricular tachycardia ([place]) 01/15/2016  . Pneumonia   . PONV (postoperative nausea and vomiting)   . Post-dural puncture headache 09/01/2017  . Presence of cardiac pacemaker 01/16/2016   July 5,2016 Overview:  2016: complete AVB    . Presence of permanent cardiac pacemaker   . Pulmonary fibrosis ([place])   . S/P left knee arthroscopy 04/01/2017  . S/P TKR (total knee replacement) using cement, right 12/07/2016  . Stroke ([place])   . Varicose veins of lower extremities with other complications 54/0/0867  . Vitamin D deficiency 08/01/2016  . Weakness generalized 11/25/2017  . Wears dentures    full  . Wide-complex tachycardia ([place])    a. Followed by Dr. Caryl Comes, "I think this is VT, but i am not sure QRSd 135 1:1 AV."    Patient Active Problem List   Diagnosis Date Noted  . Chronic anticoagulation 02/11/2018  . Orthostatic hypotension 02/11/2018  . Chronic diastolic heart failure ([place]) 02/11/2018  . AKI (acute kidney injury) ([place]) 01/01/2018  . Syncope and collapse 01/01/2018  . History of DVT (deep vein thrombosis) 11/30/2017  . Weakness generalized 11/25/2017  . Malaise and fatigue 10/27/2017  . Dyspnea on exertion 10/12/2017  . Post-dural puncture headache 09/01/2017  . Lumbar radiculopathy 07/27/2017  . Closed fracture of ramus of left pubis ([place]) 06/18/2017  . Pain of left femur 06/17/2017  . Hammer toe of left foot 06/07/2017  . Left foot pain 06/07/2017  . S/P left knee arthroscopy 04/01/2017  . Closed compression fracture of fourth lumbar vertebra ([place]) 01/19/2017  . Hemiparesis of left nondominant side as late effect of cerebral infarction ([place]) 12/22/2016  . Chronic pain of left knee 12/07/2016  . S/P TKR (total knee replacement) using cement, right 12/07/2016  . Left leg pain 08/03/2016  . Osteoporosis 08/01/2016  . Pulmonary fibrosis ([place]) 08/01/2016  . Vitamin D deficiency 08/01/2016  . Paroxysmal atrial fibrillation ([place]) 02/08/2016  . Cerebrovascular accident (CVA) due to thrombosis of right middle cerebral artery ([place]) 02/07/2016  . Presence of cardiac pacemaker 01/16/2016  . Hypertensive heart disease 01/15/2016  . Chronic kidney disease, stage III (moderate) ([place]) 01/15/2016  . Complete  atrioventricular block ([place]) 01/15/2016  . Panlobular emphysema ([place]) 01/15/2016  . Paroxysmal supraventricular tachycardia ([place]) 01/15/2016  . Chest pain with low risk for cardiac etiology 11/18/2015  . Bradycardia with 31 - 40 beats per minute 05/14/2015  . Heart block   . Pain in limb 08/16/2013  . Varicose veins of lower extremities with other complications 61/95/0932  . Edema -lower extremity 06/22/2013  . Osteoarthritis of right knee 12/12/2012  . History of pulmonary embolism 08/30/2012  . Fatigue 08/19/2012  . Wide-complex tachycardia ([place]) 05/30/2012  . Bronchiectasis, non-tuberculous ([place]) 01/06/2012  . 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 Diverticulosis   . High cholesterol   . Coronary artery disease prior MI     Past Surgical History:  Procedure Laterality Date  . APPENDECTOMY    . BLADDER SUSPENSION     tack  .  CARDIAC CATHETERIZATION Right 05/14/2015   Procedure: Temporary Pacemaker;  Surgeon: Thompson Grayer, MD;  Location: [facility];  Service: Cardiovascular;  Laterality: Right;  . CHOLECYSTECTOMY    . COLON SURGERY     12inches colectomy s/p diverticulitis  . COLONOSCOPY W/ BIOPSIES AND POLYPECTOMY    . EP IMPLANTABLE DEVICE N/A 05/14/2015   Procedure: Pacemaker Implant;  Surgeon: Thompson Grayer, MD; [place] 1 (serial number [id number])     . IR FL GUIDED LOC OF NEEDLE/CATH TIP FOR SPINAL INJECTION LT  09/06/2017  . KNEE ARTHROSCOPY Left 03/29/2017   Procedure: ARTHROSCOPY LEFT KNEE WITH PARTIAL MEDIAL AND PARTIAL LATERAL MENISCECTOMY;  Surgeon: Vickey Huger, MD;  Location: [facility];  Service: Orthopedics;  Laterality: Left;  Marland Kitchen MULTIPLE TOOTH EXTRACTIONS    . TONSILLECTOMY    . TOTAL ABDOMINAL HYSTERECTOMY    . TOTAL KNEE ARTHROPLASTY  12/12/2012   Procedure: TOTAL KNEE ARTHROPLASTY;  Surgeon: Alta Corning, MD;  Location: [facility];  Service: Orthopedics;  Laterality: Right;  right total knee arthroplasty     OB  History   None      Home Medications    Prior to Admission medications   Medication Sig Start Date End Date Taking? Authorizing Provider  furosemide (LASIX) 20 MG tablet Take 1 tablet (20 mg total) by mouth 3 (three) times a week. 02/11/18 05/12/18 Yes Richardo Priest, MD  Sennosides 15 MG CHEW Chew 1 tablet by mouth as needed (constipation).   Yes [provider]  verapamil (CALAN-SR) 240 MG CR tablet Take 1 tablet (240 mg total) by mouth at bedtime. 03/07/18  Yes Richardo Priest, MD  vitamin B-12 (CYANOCOBALAMIN) 1000 MCG tablet Take 1,000 mcg by mouth daily.   Yes [provider]  XARELTO 15 MG TABS tablet TAKE 1 TABLET (15 MG TOTAL) BY MOUTH DAILY. STROKE PREVENTION, BLOOD THINNER 01/28/18  Yes [provider]    Family History Family History  Problem Relation Age of Onset  . Heart disease Father   . Heart attack Mother   . Stomach cancer Brother   . Other Son        Triple Bypass    Social History Social History   Tobacco Use  . Smoking status: Never Smoker  . Smokeless tobacco: Never Used  Substance Use Topics  . Alcohol use: No  . Drug use: No     Allergies   Crestor [rosuvastatin]; Darvon; Meloxicam; Meperidine; Prednisone; Valsartan; Amiodarone; Aspirin-acetaminophen-caffeine; Nitroglycerin; and Tramadol   Review of Systems Review of Systems  Neurological: Positive for dizziness.  All other systems reviewed and are negative.    Physical Exam Updated Vital Signs BP (!) 146/63   Pulse 60   Temp 97.8 F (36.6 C)   Resp 14   SpO2 96%   Physical Exam  Constitutional: She is oriented to person, place, and time.  Chronically ill, slightly dehydrated   HENT:  Head: Normocephalic.  MM slightly dry   Eyes: Pupils are equal, round, and reactive to light. Conjunctivae and EOM are normal.  Neck: Normal range of motion. Neck supple.  Cardiovascular: Normal rate, regular rhythm and normal heart sounds.  Pulmonary/Chest: Effort normal  and breath sounds normal. No stridor. No respiratory distress. She has no wheezes.  Abdominal: Soft. Bowel sounds are normal. She exhibits no distension. There is no tenderness. There is no guarding.  Musculoskeletal: Normal range of motion.  No midline spinal tenderness, nl ROM bilateral hips  Neurological: She is alert and oriented to person, place, and time.  No facial droop. Strength 4/5 L arm and leg (chronic), 5/5 R side   Skin: Skin is warm.  Psychiatric: She has a normal mood and affect.  Nursing note and vitals reviewed.    ED Treatments / Results  Labs (all labs ordered are listed, but only abnormal results are displayed) Labs Reviewed  COMPREHENSIVE METABOLIC PANEL - Abnormal; Notable for the following components:      Result Value   Creatinine, Ser 1.26 (*)    Total Protein 6.0 (*)    GFR calc non Af Amer 36 (*)    GFR calc Af Amer 42 (*)    All other components within normal limits  URINE CULTURE  CBC WITH DIFFERENTIAL/PLATELET  PROTIME-INR  URINALYSIS, ROUTINE W REFLEX MICROSCOPIC  I-STAT TROPONIN, ED    EKG EKG Interpretation  Date/Time:  Tuesday Mar 15 2018 07:24:18 EDT Ventricular Rate:  67 PR Interval:    QRS Duration: 167 QT Interval:  469 QTC Calculation: 496 R Axis:   -106 Text Interpretation:  Sinus rhythm Probable left atrial enlargement Nonspecific IVCD with LAD Lateral infarct, age indeterminate Probable anteroseptal infarct, recent No significant change since last tracing Confirmed by Wandra Arthurs [phone]) on 03/15/2018 7:58:13 AM   Radiology Ct Head Wo Contrast  Result Date: 03/15/2018 CLINICAL DATA:  Fall with trauma to the back of the head and headache. EXAM: CT HEAD WITHOUT CONTRAST TECHNIQUE: Contiguous axial images were obtained from the base of the skull through the vertex without intravenous contrast. COMPARISON:  12/31/2017 FINDINGS: Brain: Chronic generalized brain atrophy. Chronic small-vessel ischemic changes of the hemispheric white  matter. Old left occipital cortical and subcortical infarction. Bilateral low-density subdural hygromas, actually slightly smaller than on the previous study. No sign of any acute component. No hydrocephalus. Vascular: There is atherosclerotic calcification of the major vessels at the base of the brain. Skull: Negative.  No skull fracture. Sinuses/Orbits: Clear/normal Other: None IMPRESSION: No acute or traumatic finding. No skull fracture. No intracranial hemorrhage. Atrophy of the brain. Chronic small-vessel ischemic changes of the white matter. Old left occipital infarction. Chronic bilateral low-density subdural hygromas, slightly smaller than in February. No acute component. Electronically Signed   By: Nelson Chimes M.D.   On: 03/15/2018 08:58    Procedures Procedures (including critical care time)  Medications Ordered in ED Medications  heparin injection 5,000 Units (has no administration in time range)  ondansetron (ZOFRAN) tablet 4 mg (has no administration in time range)    Or  ondansetron (ZOFRAN) injection 4 mg (has no administration in time range)  alum & mag hydroxide-simeth (MAALOX/MYLANTA) 200-200-20 MG/5ML suspension 30 mL (has no administration in time range)  sodium chloride 0.9 % bolus 500 mL (0 mLs Intravenous Stopped 03/15/18 0935)  acetaminophen (TYLENOL) tablet 650 mg (650 mg Oral Given 03/15/18 1124)     Initial Impression / Assessment and Plan / ED Course  I have reviewed the triage vital signs and the nursing notes.  Pertinent labs & imaging results that were available during my care of the patient were reviewed by me and considered in my medical decision making (see chart for details).     VIRA CHAPLIN is a 84 y.o. female here with dizziness, near syncope. Consider vasovagal vs arrhythmia vs dehydration. Less likely another stroke. Will get labs, CT head, UA. Will get orthostatics and hydrate and reassess.   12:28 PM CT head showed previous L occipital infarct.  There is bilateral hygromas. Labs and UA unremarkable. I called Dr. Cheral Marker from neurology, who will see patient. Patient can't get MRI due to pacemaker. Medicine to admit for TIA vs stroke and worsening weakness.      Final Clinical Impressions(s) / ED Diagnoses   Final diagnoses:  None    ED Discharge Orders    None       Drenda Freeze, MD 03/15/18 1228

## 2018-03-15 NOTE — H&P (Addendum)
History and Physical    [name] [name] MAU:[id number] DOB: Mar 13, 1924 DOA: 03/15/2018   PCP: Algis Greenhouse, MD   Patient coming from:  Home    Chief Complaint: Syncope   HPI: [name] [name] is a 84 y.o. female with medical history significant for right lower extremity DVT on Xarelto, history of pacemaker placement with complete AV heart block, hypertension, CKD stage III, COPD, history of pulmonary fibrosis, history of CVA with mild left-sided weakness, brought to the emergency department for evaluation of dizziness and syncope.  Patient was at home, waking up around 6 AM, able to walk to the bathroom, sitting her, able to urinate, then getting up, feeling lightheaded and dizzy, sat down on the floor, and called her family member, who came to her assistance. Denies being constipated or having a hard bowel movement.  Of note, the patient was able to know what was going on, however she reports that immediately after that, she may have lost consciousness for about a minute or so.No heavy lifting.  She denies hitting her head.  She denies any chest pain, or palpitations.  She denies any shortness of breath, cough, or recent infections.  She denies any fever, chills, cough. No abdominal pain, nausea or vomiting,  She denies any worsening unilateral weakness, calf pain, or leg swelling.  She denies any numbness, or tingling, or slurred speech.  No confusion was reported.  She denies any vision changes or blurred vision.  When EMS arrived, the patient was noted to be hypotensive, with blood pressure in the 80s, brought to the emergency department.  She received on transit 500 cc of IV fluid bolus, and another 500 cc of IV fluid bolus in here, with significant improvement of her BP.  Patient reports that recently, 1 of her blood pressure medication has been changed, which appears to be Calan 240 mg at bedtime per medical records, as her "rate was too high so the doctor changed my dose "she is compliant with  these meds, she does not partake tobacco, alcohol or recreational drug use.  She denies any recent long distance trips.  She denies any herbal supplements.  Of note, the patient has been increasingly fatigued over the last 2 months, she reports not having energy, however she is able to tolerate p.o.'s, and she has not decreased appetite.   ED Course:  BP (!) 146/63   Pulse 60   Temp 97.8 F (36.6 C)   Resp 14   SpO2 96%    CT head neg for acute changes EKG SR no acute findings.  Tn neg UA neg Cr 1.26 MRI could not be obtained due to PMP   Review of Systems:  As per HPI otherwise all other systems reviewed and are negative  Past Medical History:  Diagnosis Date  . Acute deep vein thrombosis (DVT) of right peroneal vein ([place]) 11/30/2017   Overview:  5638: uncomplicated, xarelto  . AKI (acute kidney injury) ([place]) 01/01/2018  . Arthritis   . Atrial fibrillation ([place]) 02/08/2016  . Benign essential HTN 01/15/2016  . Bradycardia with 31 - 40 beats per minute 05/14/2015  . Bronchiectasis, non-tuberculous ([place])    a. Followed by Dr. Joya Gaskins.  . Cerebrovascular accident (CVA) due to thrombosis of right middle cerebral artery ([place]) 02/07/2016   Overview:  2017: R-MCA thrombosis with left non-dom hemiparesis, thalamic capsule ischemia 2018: recurrent  . Chest pain with low risk for cardiac etiology 11/18/2015  . Chronic kidney disease, stage III (moderate) (HCC)  01/15/2016  . Chronic pain of left knee 12/07/2016  . Closed compression fracture of fourth lumbar vertebra ([place]) 01/19/2017   Overview:  2015  . Closed fracture of ramus of left pubis ([place]) 06/18/2017   Overview:  2018  . Colon polyps   . Complete atrioventricular block ([place]) 01/15/2016   Overview:  2016: PM  . Complete heart block ([place])    a. s/p MDT dual chamber PPM   . COPD (chronic obstructive pulmonary disease) ([place])    past hx with pneumonia  . Coronary artery disease prior MI   . Diverticulosis   . DVT (deep venous thrombosis) (HCC)     Resolved, no anticoagulation currently  . Dyspnea on exertion 10/12/2017   Overview:  2018  . Edema -lower extremity 06/22/2013  . Fatigue 08/19/2012  . GERD (gastroesophageal reflux disease)   . Hammer toe of left foot 06/07/2017  . Headache   . Heart block   . Hemiparesis of left nondominant side as late effect of cerebral infarction ([place]) 12/22/2016  . High cholesterol   . History of pulmonary embolism 08/30/2012   PE 2013  >>repeat CT Angio chest 12/2013>>>NO PE   . Hypertension   . IBS (irritable bowel syndrome)   . Left foot pain 06/07/2017  . Left leg pain 08/03/2016   Overview:  2017: related to weakness left leg from stroke  . Lumbar radiculopathy 07/27/2017  . 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 Malaise and fatigue 10/27/2017  . MI (myocardial infarction) ?? probably not 1975   a. 1975 Reported MI;  b. 2011 Cath: nonobs dzs;  c. 2013 Nonischemic Myoview;  d. 12/2012 Echo: EF 60-65%, Gr1 DD.  . Nephrolithiasis   . Orthostatic syncope 01/01/2018  . Osteoarthritis of right knee 12/12/2012  . Osteoporosis 08/01/2016  . Pain in limb 08/16/2013  . Pain of left femur 06/17/2017   Overview:  2018  . Palpitation    a. 2010 Event Monitor: PACs and PVCs  . Panlobular emphysema ([place]) 01/15/2016  . Paroxysmal supraventricular tachycardia ([place]) 01/15/2016  . Pneumonia   . PONV (postoperative nausea and vomiting)   . Post-dural puncture headache 09/01/2017  . Presence of cardiac pacemaker 01/16/2016   July 5,2016 Overview:  2016: complete AVB  . Presence of permanent cardiac pacemaker   . Pulmonary fibrosis ([place])   . S/P left knee arthroscopy 04/01/2017  . S/P TKR (total knee replacement) using cement, right 12/07/2016  . Stroke ([place])   . Varicose veins of lower extremities with other complications 29/07/3715  . Vitamin D deficiency 08/01/2016  . Weakness generalized 11/25/2017  . Wears dentures    full  . Wide-complex tachycardia ([place])    a. Followed by Dr. Caryl Comes, "I  think this is VT, but i am not sure QRSd 135 1:1 AV."    Past Surgical History:  Procedure Laterality Date  . APPENDECTOMY    . BLADDER SUSPENSION     tack  . CARDIAC CATHETERIZATION Right 05/14/2015   Procedure: Temporary Pacemaker;  Surgeon: Thompson Grayer, MD;  Location: [facility];  Service: Cardiovascular;  Laterality: Right;  . CHOLECYSTECTOMY    . COLON SURGERY     12inches colectomy s/p diverticulitis  . COLONOSCOPY W/ BIOPSIES AND POLYPECTOMY    . EP IMPLANTABLE DEVICE N/A 05/14/2015   Procedure: Pacemaker Implant;  Surgeon: Thompson Grayer, MD; [place] 1 (serial number [id number])     . IR FL GUIDED LOC OF NEEDLE/CATH TIP FOR  SPINAL INJECTION LT  09/06/2017  . KNEE ARTHROSCOPY Left 03/29/2017   Procedure: ARTHROSCOPY LEFT KNEE WITH PARTIAL MEDIAL AND PARTIAL LATERAL MENISCECTOMY;  Surgeon: Vickey Huger, MD;  Location: [facility];  Service: Orthopedics;  Laterality: Left;  [place] Kitchen MULTIPLE TOOTH EXTRACTIONS    . TONSILLECTOMY    . TOTAL ABDOMINAL HYSTERECTOMY    . TOTAL KNEE ARTHROPLASTY  12/12/2012   Procedure: TOTAL KNEE ARTHROPLASTY;  Surgeon: Alta Corning, MD;  Location: [facility];  Service: Orthopedics;  Laterality: Right;  right total knee arthroplasty    Social History Social History   Socioeconomic History  . Marital status: Married    Spouse name: Not on file  . Number of children: 3  . Years of education: Not on file  . Highest education level: Not on file  Occupational History  . Occupation: Retired    Comment: Musician  Social Needs  . Financial resource strain: Not on file  . Food insecurity:    Worry: Not on file    Inability: Not on file  . Transportation needs:    Medical: Not on file    Non-medical: Not on file  Tobacco Use  . Smoking status: Never Smoker  . Smokeless tobacco: Never Used  Substance and Sexual Activity  . Alcohol use: No  . Drug use: No  . Sexual activity: Not on file  Lifestyle  . Physical activity:     Days per week: Not on file    Minutes per session: Not on file  . Stress: Not on file  Relationships  . Social connections:    Talks on phone: Not on file    Gets together: Not on file    Attends religious service: Not on file    Active member of club or organization: Not on file    Attends meetings of clubs or organizations: Not on file    Relationship status: Not on file  . Intimate partner violence:    Fear of current or ex partner: Not on file    Emotionally abused: Not on file    Physically abused: Not on file    Forced sexual activity: Not on file  Other Topics Concern  . Not on file  Social History Narrative  . Not on file     Allergies  Allergen Reactions  . Crestor [Rosuvastatin] Other (See Comments)    Myalgia  . Darvon Other (See Comments)    Drop in  BP  . Meloxicam Other (See Comments)    Dizziness   . Meperidine Other (See Comments)    Hypotension  . Prednisone Diarrhea    POLYURIA  . Valsartan Other (See Comments)     Hypotension  . Amiodarone Other (See Comments)    Did not tolerate - unknown reaction  . Aspirin-Acetaminophen-Caffeine Other (See Comments)    Unknown reaction  . Nitroglycerin     Profound hypotension with sublingual nitroglycerin.   . Tramadol Other (See Comments)    Unknown reaction    Family History  Problem Relation Age of Onset  . Heart disease Father   . Heart attack Mother   . Stomach cancer Brother   . Other Son        Triple Bypass      Prior to Admission medications   Medication Sig Start Date End Date Taking? Authorizing Provider  furosemide (LASIX) 20 MG tablet Take 1 tablet (20 mg total) by mouth 3 (three) times a week. 02/11/18 05/12/18 Yes Richardo Priest, MD  Sennosides 15 MG CHEW Chew 1 tablet by mouth as needed (constipation).   Yes [provider]  verapamil (CALAN-SR) 240 MG CR tablet Take 1 tablet (240 mg total) by mouth at bedtime. 03/07/18  Yes Richardo Priest, MD  vitamin B-12 (CYANOCOBALAMIN)  1000 MCG tablet Take 1,000 mcg by mouth daily.   Yes [provider]  XARELTO 15 MG TABS tablet TAKE 1 TABLET (15 MG TOTAL) BY MOUTH DAILY. STROKE PREVENTION, BLOOD THINNER 01/28/18  Yes [provider]    Physical Exam:  Vitals:   03/15/18 1118 03/15/18 1130 03/15/18 1145 03/15/18 1200  BP:  (!) 151/66 (!) 144/61 (!) 146/63  Pulse:  (!) 59 60 60  Resp:  15 17 14   Temp: 97.8 F (36.6 C)     TempSrc:      SpO2:  97% 94% 96%   Constitutional: NAD, calm, comfortable, frail appearing. Eyes: PERRL, lids and conjunctivae normal ENMT: Mucous membranes are dry, without exudate or lesions  Neck: normal, supple, no masses, no thyromegaly Respiratory: clear to auscultation bilaterally, no wheezing, no crackles. Normal respiratory effort  Cardiovascular: Regular rate and rhythm, 2 out of 6 murmur, rubs or gallops. No extremity edema. 2+ pedal pulses. No carotid bruits.  Abdomen: Soft, non tender, No hepatosplenomegaly. Bowel sounds positive.  Musculoskeletal: no clubbing / cyanosis. Moves all extremities Skin: no jaundice, No lesions.  Neurologic: Sensation intact  Strength equal in all but left upper extremity, over about 4 out of 5, which is chronic.  No facial droop is noted.  No confusion is noted. Psychiatric:   Alert and oriented x 3. Normal mood.     Labs on Admission: I have personally reviewed following labs and imaging studies  CBC: Recent Labs  Lab 03/15/18 0805  WBC 5.2  NEUTROABS 3.4  HGB 12.8  HCT 40.0  MCV 93.0  PLT 073    Basic Metabolic Panel: Recent Labs  Lab 03/15/18 0805  NA 140  K 4.0  CL 108  CO2 23  GLUCOSE 92  BUN 20  CREATININE 1.26*  CALCIUM 9.0    GFR: Estimated Creatinine Clearance: 27.2 mL/min (A) (by C-G formula based on SCr of 1.26 mg/dL (H)).  Liver Function Tests: Recent Labs  Lab 03/15/18 0805  AST 37  ALT 40  ALKPHOS 52  BILITOT 0.6  PROT 6.0*  ALBUMIN 3.5   No results for input(s): LIPASE, AMYLASE in the  last 168 hours. No results for input(s): AMMONIA in the last 168 hours.  Coagulation Profile: Recent Labs  Lab 03/15/18 0805  INR 1.21    Cardiac Enzymes: No results for input(s): CKTOTAL, CKMB, CKMBINDEX, TROPONINI in the last 168 hours.  BNP (last 3 results) No results for input(s): PROBNP in the last 8760 hours.  HbA1C: No results for input(s): HGBA1C in the last 72 hours.  CBG: No results for input(s): GLUCAP in the last 168 hours.  Lipid Profile: No results for input(s): CHOL, HDL, LDLCALC, TRIG, CHOLHDL, LDLDIRECT in the last 72 hours.  Thyroid Function Tests: No results for input(s): TSH, T4TOTAL, FREET4, T3FREE, THYROIDAB in the last 72 hours.  Anemia Panel: No results for input(s): VITAMINB12, FOLATE, FERRITIN, TIBC, IRON, RETICCTPCT in the last 72 hours.  Urine analysis:    Component Value Date/Time   COLORURINE YELLOW 03/15/2018 1028   APPEARANCEUR CLEAR 03/15/2018 1028   LABSPEC 1.009 03/15/2018 1028   PHURINE 6.0 03/15/2018 1028   GLUCOSEU NEGATIVE 03/15/2018 1028   HGBUR NEGATIVE 03/15/2018 1028  BILIRUBINUR NEGATIVE 03/15/2018 1028   KETONESUR NEGATIVE 03/15/2018 1028   PROTEINUR NEGATIVE 03/15/2018 1028   UROBILINOGEN 0.2 12/06/2012 0916   NITRITE NEGATIVE 03/15/2018 1028   LEUKOCYTESUR NEGATIVE 03/15/2018 1028    Sepsis Labs: @LABRCNTIP (procalcitonin:4,lacticidven:4) )No results found for this or any previous visit (from the past 240 hour(s)).   Radiological Exams on Admission: Ct Head Wo Contrast  Result Date: 03/15/2018 CLINICAL DATA:  Fall with trauma to the back of the head and headache. EXAM: CT HEAD WITHOUT CONTRAST TECHNIQUE: Contiguous axial images were obtained from the base of the skull through the vertex without intravenous contrast. COMPARISON:  12/31/2017 FINDINGS: Brain: Chronic generalized brain atrophy. Chronic small-vessel ischemic changes of the hemispheric white matter. Old left occipital cortical and subcortical infarction.  Bilateral low-density subdural hygromas, actually slightly smaller than on the previous study. No sign of any acute component. No hydrocephalus. Vascular: There is atherosclerotic calcification of the major vessels at the base of the brain. Skull: Negative.  No skull fracture. Sinuses/Orbits: Clear/normal Other: None IMPRESSION: No acute or traumatic finding. No skull fracture. No intracranial hemorrhage. Atrophy of the brain. Chronic small-vessel ischemic changes of the white matter. Old left occipital infarction. Chronic bilateral low-density subdural hygromas, slightly smaller than in February. No acute component. Electronically Signed   By: Nelson Chimes M.D.   On: 03/15/2018 08:58    EKG: Independently reviewed. SR no significant changes from prior  Assessment/Plan Active Problems:   Syncope and collapse   High cholesterol   Coronary artery disease prior MI   Hypertensive heart disease   History of pulmonary embolism   Presence of cardiac pacemaker   Cerebrovascular accident (CVA) due to thrombosis of right middle cerebral artery (HCC)   Chronic kidney disease, stage III (moderate) (HCC)   History of DVT (deep vein thrombosis)   Chronic anticoagulation   Chronic diastolic heart failure (HCC)   Syncope. Labs, EKG and Tn unrevealing. Neuro exam unremarkable. Patient has a history of orthostatic syncope. Recent medication adjustment by her cardiologist due to tachycardia.No apparent seizures, or narcolepsy, No apparent valasalva induced syncope,   Most recent 2D echo was performed in February 2019, does not to be repeated.  These shows EF 55 to 60%, with grade 1 diastolic, normal systolic. UA neg.   CT of the head is negative for acute findings, and there is low-density subdural hygroma seen bilaterally, slightly smaller than in February, chronic small vessel ischemic changes of the white matter, and old left occipital infarction.  No intracranial hemorrhage is seen.  Neurologist to see the  patient, Dr. Cheral Marker.  Per EDP, a repeat CT of the head will be performed, awaiting for further recommendations by neurology, as MRI brain cannot be performed due to PMP .  [facility] syncope rule shows she is not in the low risk group for serious outcome  Syncope order set  Fall precautions Observation Tele bed. Orthostatic hypertension Pacemaker interrogation  CT scan of head as per neurology recommendations Carotid Ultrasound IV fluids  TSH due to increasing fatigue    EKG in am Hold Beta blockers and other BP meds Neuro checks PT/OT once able to ambulate more comfortably If patient becomes more symptomatic or worsening symptoms will obtain Cards evaluation   Hypertension BP  146/63   Pulse 60 Controlled Hold  home anti-hypertensive medications due to syncope   Follow in [place] Cardiologist  as OP   History of DVT RLE in 2014, PE  PMP . [place] artery CVA, Afib CHADSVASC 6  on Xarelto Xarelto per Pharmacy dose  Calan on hold dur to syncope, will resume once BP improves   Diastolic CHF: No acute decompensation weight 139 lbs    Last 2 D echo 11/2017, as above  Hold Lasix HoldCa CH Blocker  Obtain daily weights Monitor intake and output   Chronic kidney disease stage    baseline creatinine1, current 1.26  Lab Results  Component Value Date   CREATININE 1.26 (H) 03/15/2018   CREATININE 1.08 (H) 01/18/2018   CREATININE 1.16 (H) 01/01/2018  IVF Hold diuretics  Repeat BMET in am    COPD without exacerbation Osats normal   WBC normal  Continue oxygen, no other intervention at this time     DVT prophylaxis:  Xarelto  Code Status:    DNR  Family Communication:  Discussed with patient Disposition Plan: Expect patient to be discharged to home after condition improves Consults called:    Neuro, Dr. Cheral Marker  Admission status:  Tele Obs    Sharene Butters, PA-C [facility]   Amion text  [phone]   03/15/2018, 12:50 PM

## 2018-03-15 NOTE — ED Notes (Signed)
Gave pt water, per Maralyn Sago - RN.

## 2018-03-15 NOTE — Progress Notes (Signed)
ANTICOAGULATION CONSULT NOTE - Initial Consult  Pharmacy Consult for Rivaroxaban Indication: pulmonary embolus and DVT  Allergies  Allergen Reactions  . Crestor [Rosuvastatin] Other (See Comments)    Myalgia  . Darvon Other (See Comments)    Drop in  BP  . Meloxicam Other (See Comments)    Dizziness   . Meperidine Other (See Comments)    Hypotension  . Prednisone Diarrhea    POLYURIA  . Valsartan Other (See Comments)     Hypotension  . Amiodarone Other (See Comments)    Did not tolerate - unknown reaction  . Aspirin-Acetaminophen-Caffeine Other (See Comments)    Unknown reaction  . Nitroglycerin     Profound hypotension with sublingual nitroglycerin.   . Tramadol Other (See Comments)    Unknown reaction    Patient Measurements:   Heparin Dosing Weight: n/a   Vital Signs: Temp: 97.8 F (36.6 C) (05/07 1118) Temp Source: Oral (05/07 0730) BP: 146/63 (05/07 1200) Pulse Rate: 60 (05/07 1200)  Labs: Recent Labs    03/15/18 0805  HGB 12.8  HCT 40.0  PLT 164  LABPROT 15.2  INR 1.21  CREATININE 1.26*    Estimated Creatinine Clearance: 27.2 mL/min (A) (by C-G formula based on SCr of 1.26 mg/dL (H)).   Medical History: Past Medical History:  Diagnosis Date  . Acute deep vein thrombosis (DVT) of right peroneal vein ([place]) 11/30/2017   Overview:  1610: uncomplicated, xarelto  . AKI (acute kidney injury) ([place]) 01/01/2018  . Arthritis   . Atrial fibrillation ([place]) 02/08/2016  . Benign essential HTN 01/15/2016  . Bradycardia with 31 - 40 beats per minute 05/14/2015  . Bronchiectasis, non-tuberculous ([place])    a. Followed by Dr. Joya Gaskins.  . Cerebrovascular accident (CVA) due to thrombosis of right middle cerebral artery ([place]) 02/07/2016   Overview:  2017: R-MCA thrombosis with left non-dom hemiparesis, thalamic capsule ischemia 2018: recurrent  . Chest pain with low risk for cardiac etiology 11/18/2015  . Chronic kidney disease, stage III (moderate) ([place]) 01/15/2016  . Chronic  pain of left knee 12/07/2016  . Closed compression fracture of fourth lumbar vertebra ([place]) 01/19/2017   Overview:  2015  . Closed fracture of ramus of left pubis ([place]) 06/18/2017   Overview:  2018  . Colon polyps   . Complete atrioventricular block ([place]) 01/15/2016   Overview:  2016: PM  . Complete heart block ([place])    a. s/p MDT dual chamber PPM   . COPD (chronic obstructive pulmonary disease) ([place])    past hx with pneumonia  . Coronary artery disease prior MI   . Diverticulosis   . DVT (deep venous thrombosis) (HCC)    Resolved, no anticoagulation currently  . Dyspnea on exertion 10/12/2017   Overview:  2018  . Edema -lower extremity 06/22/2013  . Fatigue 08/19/2012  . GERD (gastroesophageal reflux disease)   . Hammer toe of left foot 06/07/2017  . Headache   . Heart block   . Hemiparesis of left nondominant side as late effect of cerebral infarction ([place]) 12/22/2016  . High cholesterol   . History of pulmonary embolism 08/30/2012   PE 2013  >>repeat CT Angio chest 12/2013>>>NO PE   . Hypertension   . IBS (irritable bowel syndrome)   . Left foot pain 06/07/2017  . Left leg pain 08/03/2016   Overview:  2017: related to weakness left leg from stroke  . Lumbar radiculopathy 07/27/2017  . 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 Malaise and fatigue 10/27/2017  .  MI (myocardial infarction) ?? probably not 1975   a. 1975 Reported MI;  b. 2011 Cath: nonobs dzs;  c. 2013 Nonischemic Myoview;  d. 12/2012 Echo: EF 60-65%, Gr1 DD.  . Nephrolithiasis   . Orthostatic syncope 01/01/2018  . Osteoarthritis of right knee 12/12/2012  . Osteoporosis 08/01/2016  . Pain in limb 08/16/2013  . Pain of left femur 06/17/2017   Overview:  2018  . Palpitation    a. 2010 Event Monitor: PACs and PVCs  . Panlobular emphysema ([place]) 01/15/2016  . Paroxysmal supraventricular tachycardia ([place]) 01/15/2016  . Pneumonia   . PONV (postoperative nausea and vomiting)   . Post-dural puncture headache  09/01/2017  . Presence of cardiac pacemaker 01/16/2016   July 5,2016 Overview:  2016: complete AVB  . Presence of permanent cardiac pacemaker   . Pulmonary fibrosis ([place])   . S/P left knee arthroscopy 04/01/2017  . S/P TKR (total knee replacement) using cement, right 12/07/2016  . Stroke ([place])   . Varicose veins of lower extremities with other complications 00/02/5996  . Vitamin D deficiency 08/01/2016  . Weakness generalized 11/25/2017  . Wears dentures    full  . Wide-complex tachycardia ([place])    a. Followed by Dr. Caryl Comes, "I think this is VT, but i am not sure QRSd 135 1:1 AV."    Medications:   (Not in a hospital admission)  Assessment: 81 YOF with a history of R LE DVT and PE on Xarelto at home to resume anticoagulation on admission. Last dose of Xarelto was yesterday. H/H and plt wnl. SCr 1.26.  Goal of Therapy:  DVT/PE treatment Monitor platelets by anticoagulation protocol: Yes   Plan:  -Resume Xarelto 15 mg daily  -Monitor CBC, renal fx, and s/s of bleeding.   Albertina Parr, PharmD., BCPS Clinical Pharmacist Clinical phone for 03/15/18 until 3:30pm: [phone] If after 3:30pm, please call main pharmacy at: [phone]

## 2018-03-15 NOTE — Consult Note (Signed)
NEURO HOSPITALIST CONSULT NOTE   Requestig physician: Dr. Evangeline Gula  Reason for Consult: Increased left sided weakeness  History obtained from:  Patient and Chart    HPI:                                                                                                                                          [name] [name] is an 84 y.o. female  With a history of right middle cerebral artery hemorrhagic stroke in 2013 with residual left sided weakness, HTN, hypercholesterolemia, bradycardia, a-fib, pacemaker, DVT on Xarelto, AKI. Patient presented  to the ED due to dizziness with passing out at home today while using the bathroom. Patient states that she woke up about 5:30 am to use the restroom. She usually uses a cane, but the cane was too far so she used the wall to assist in walking to the bathroom. Once she was there she weighed herself like she does every morning (per cardiology request) and then she went to pee. She felt that she was going to be sick and called out to her grand daughter saying "do not let me fall". 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 She also reported that she has had generalized weakness with increased left-sided weakness for 3 months. Patient states that Monday cardiology changed her pace-maker rate because her heart was beating too fast.  CBC with differential, CMP shows increased Creatinine and decreased GFR consistent with CKD stage III, U/A, INR  CT of head was ordered w/o contrast: IMPRESSION: No acute or traumatic finding. No skull fracture. No intracranial Hemorrhage. Atrophy of the brain. Chronic small-vessel ischemic changes of the white matter. Old left occipital infarction. Chronic bilateral low-density subdural hygromas, slightly smaller than in February. No acute component.  Past Medical History:  Diagnosis Date  . Acute deep vein thrombosis (DVT) of right peroneal vein ([place]) 11/30/2017   Overview:  8756:  uncomplicated, xarelto  . AKI (acute kidney injury) ([place]) 01/01/2018  . Arthritis   . Atrial fibrillation ([place]) 02/08/2016  . Benign essential HTN 01/15/2016  . Bradycardia with 31 - 40 beats per minute 05/14/2015  . Bronchiectasis, non-tuberculous ([place])    a. Followed by Dr. Joya Gaskins.  . Cerebrovascular accident (CVA) due to thrombosis of right middle cerebral artery ([place]) 02/07/2016   Overview:  2017: R-MCA thrombosis with left non-dom hemiparesis, thalamic capsule ischemia 2018: recurrent  . Chest pain with low risk for cardiac etiology 11/18/2015  . Chronic kidney disease, stage III (moderate) ([place]) 01/15/2016  . Chronic pain of left knee 12/07/2016  . Closed compression fracture of fourth lumbar vertebra ([place]) 01/19/2017   Overview:  2015  . Closed fracture of ramus of left pubis ([place]) 06/18/2017   Overview:  2018  .  Colon polyps   . Complete atrioventricular block ([place]) 01/15/2016   Overview:  2016: PM  . Complete heart block ([place])    a. s/p MDT dual chamber PPM   . COPD (chronic obstructive pulmonary disease) ([place])    past hx with pneumonia  . Coronary artery disease prior MI   . Diverticulosis   . DVT (deep venous thrombosis) ([facility])    Resolved, no anticoagulation currently  . Dyspnea on exertion 10/12/2017   Overview:  2018  . Edema -lower extremity 06/22/2013  . Fatigue 08/19/2012  . GERD (gastroesophageal reflux disease)   . Hammer toe of left foot 06/07/2017  . Headache   . Heart block   . Hemiparesis of left nondominant side as late effect of cerebral infarction ([place]) 12/22/2016  . High cholesterol   . History of pulmonary embolism 08/30/2012   PE 2013  >>repeat CT Angio chest 12/2013>>>NO PE   . Hypertension   . IBS (irritable bowel syndrome)   . Left foot pain 06/07/2017  . Left leg pain 08/03/2016   Overview:  2017: related to weakness left leg from stroke  . Lumbar radiculopathy 07/27/2017  . 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 Malaise and fatigue 10/27/2017   . MI (myocardial infarction) ?? probably not 1975   a. 1975 Reported MI;  b. 2011 Cath: nonobs dzs;  c. 2013 Nonischemic Myoview;  d. 12/2012 Echo: EF 60-65%, Gr1 DD.  . Nephrolithiasis   . Orthostatic syncope 01/01/2018  . Osteoarthritis of right knee 12/12/2012  . Osteoporosis 08/01/2016  . Pain in limb 08/16/2013  . Pain of left femur 06/17/2017   Overview:  2018  . Palpitation    a. 2010 Event Monitor: PACs and PVCs  . Panlobular emphysema ([place]) 01/15/2016  . Paroxysmal supraventricular tachycardia ([place]) 01/15/2016  . Pneumonia   . PONV (postoperative nausea and vomiting)   . Post-dural puncture headache 09/01/2017  . Presence of cardiac pacemaker 01/16/2016   July 5,2016 Overview:  2016: complete AVB  . Presence of permanent cardiac pacemaker   . Pulmonary fibrosis ([place])   . S/P left knee arthroscopy 04/01/2017  . S/P TKR (total knee replacement) using cement, right 12/07/2016  . Stroke ([place])   . Varicose veins of lower extremities with other complications 16/11/958  . Vitamin D deficiency 08/01/2016  . Weakness generalized 11/25/2017  . Wears dentures    full  . Wide-complex tachycardia ([place])    a. Followed by Dr. Caryl Comes, "I think this is VT, but i am not sure QRSd 135 1:1 AV."    Past Surgical History:  Procedure Laterality Date  . APPENDECTOMY    . BLADDER SUSPENSION     tack  . CARDIAC CATHETERIZATION Right 05/14/2015   Procedure: Temporary Pacemaker;  Surgeon: Thompson Grayer, MD;  Location: [facility];  Service: Cardiovascular;  Laterality: Right;  . CHOLECYSTECTOMY    . COLON SURGERY     12inches colectomy s/p diverticulitis  . COLONOSCOPY W/ BIOPSIES AND POLYPECTOMY    . EP IMPLANTABLE DEVICE N/A 05/14/2015   Procedure: Pacemaker Implant;  Surgeon: Thompson Grayer, MD; [place] 1 (serial number [id number])     . IR FL GUIDED LOC OF NEEDLE/CATH TIP FOR SPINAL INJECTION LT  09/06/2017  . KNEE ARTHROSCOPY Left 03/29/2017   Procedure: ARTHROSCOPY LEFT KNEE  WITH PARTIAL MEDIAL AND PARTIAL LATERAL MENISCECTOMY;  Surgeon: Vickey Huger, MD;  Location: [facility] [address];  Service: Orthopedics;  Laterality: Left;  Marland Kitchen MULTIPLE TOOTH  EXTRACTIONS    . TONSILLECTOMY    . TOTAL ABDOMINAL HYSTERECTOMY    . TOTAL KNEE ARTHROPLASTY  12/12/2012   Procedure: TOTAL KNEE ARTHROPLASTY;  Surgeon: Alta Corning, MD;  Location: [facility];  Service: Orthopedics;  Laterality: Right;  right total knee arthroplasty    Family History  Problem Relation Age of Onset  . Heart disease Father   . Heart attack Mother   . Stomach cancer Brother   . Other Son        Triple Bypass      Social History:  reports that she has never smoked. She has never used smokeless tobacco. She reports that she does not drink alcohol or use drugs.  Allergies  Allergen Reactions  . Crestor [Rosuvastatin] Other (See Comments)    Myalgia  . Darvon Other (See Comments)    Drop in  BP  . Meloxicam Other (See Comments)    Dizziness   . Meperidine Other (See Comments)    Hypotension  . Prednisone Diarrhea    POLYURIA  . Valsartan Other (See Comments)     Hypotension  . Amiodarone Other (See Comments)    Did not tolerate - unknown reaction  . Aspirin-Acetaminophen-Caffeine Other (See Comments)    Unknown reaction  . Nitroglycerin     Profound hypotension with sublingual nitroglycerin.   . Tramadol Other (See Comments)    Unknown reaction    MEDICATIONS:                                                                                                                      Current Facility-Administered Medications  Medication Dose Route Frequency Provider Last Rate Last Dose  . 0.9 %  sodium chloride infusion   Intravenous Continuous Wertman, Coralee Pesa, PA-C      . alum & mag hydroxide-simeth (MAALOX/MYLANTA) 200-200-20 MG/5ML suspension 30 mL  30 mL Oral Q6H PRN Rondel Jumbo, PA-C      . ondansetron (ZOFRAN) tablet 4 mg  4 mg Oral Q6H PRN Rondel Jumbo, PA-C       Or  . ondansetron (ZOFRAN)  injection 4 mg  4 mg Intravenous Q6H PRN Rondel Jumbo, PA-C       Current Outpatient Medications  Medication Sig Dispense Refill  . furosemide (LASIX) 20 MG tablet Take 1 tablet (20 mg total) by mouth 3 (three) times a week. 40 tablet 3  . Sennosides 15 MG CHEW Chew 1 tablet by mouth as needed (constipation).    . verapamil (CALAN-SR) 240 MG CR tablet Take 1 tablet (240 mg total) by mouth at bedtime. 30 tablet 11  . vitamin B-12 (CYANOCOBALAMIN) 1000 MCG tablet Take 1,000 mcg by mouth daily.    Alveda Reasons 15 MG TABS tablet TAKE 1 TABLET (15 MG TOTAL) BY MOUTH DAILY. STROKE PREVENTION, BLOOD THINNER  3    ROS:  History obtained from chart review and the patient  General ROS: Generalized weakness for 3 months, weight gain or weight loss Psychological ROS: negative for - behavioral disorder, hallucinations, memory difficulties, mood swings or suicidal ideation Ophthalmic ROS: negative for - blurry vision, double vision, eye pain or loss of vision Respiratory ROS: negative for - cough, hemoptysis, shortness of breath or wheezing Cardiovascular ROS: negative for - chest pain, dyspnea on exertion, edema or irregular heartbeat Gastrointestinal ROS: negative for - abdominal pain, diarrhea, hematemesis, nausea/vomiting or stool incontinence Genito-Urinary ROS: negative for - dysuria, hematuria, incontinence or urinary frequency/urgency Musculoskeletal ROS: negative for - joint swelling or muscular weakness, left leg groin pain that radiates all the way to feet. Neurological ROS: as noted in HPI    Blood pressure (!) 146/63, pulse 60, temperature 97.8 F (36.6 C), resp. rate 14, SpO2 96 %.   General Examination:                                                                                                      Physical Exam  HEENT-  Normocephalic, no lesions,  without obvious abnormality.  Normal external eye and conjunctiva.   Cardiovascular- S1-S2 audible, pulses palpable throughout   Lungs-no rhonchi or wheezing noted, no excessive working breathing.  Saturations within normal limits Extremities- Warm, dry and intact Musculoskeletal-no joint tenderness,  or swelling, toes with some deformities noted Skin-warm and dry, no hyperpigmentation, vitiligo, or suspicious lesions  Neurological Examination Mental Status: Alert, oriented, thought content appropriate.  Speech fluent without evidence of aphasia.  Able to follow 3 step commands without difficulty. Cranial Nerves: II:  Visual fields grossly normal,  III,IV, VI: ptosis not present, extra-ocular motions intact bilaterally; pupils equal, round, reactive to light and accommodation V,VII: smile symmetric, facial light touch sensation normal bilaterally VIII: hearing intact to voice IX,X: No hypophonia XI: Symmetric XII: midline tongue extension Motor: Right : Upper extremity   4/5   Left:     Upper extremity   4/5 though weaker than right  Lower extremity   4/5    Lower extremity   4/5, weaker than right Normal tone throughout; no atrophy noted Sensory:  light touch intact throughout, bilaterally Deep Tendon Reflexes: 2+ and symmetric to all but right knee in the context of prior right knee replacement Plantars: Right: downgoing   Left: downgoing Cerebellar: normal finger-to-nose; exhibits slow rapid alternating movements; normal heel-to-shin test Gait: UTA due to orthostatic hypotension ( noted in ED)   Lab Results: Basic Metabolic Panel: Recent Labs  Lab 03/15/18 0805  NA 140  K 4.0  CL 108  CO2 23  GLUCOSE 92  BUN 20  CREATININE 1.26*  CALCIUM 9.0    CBC: Recent Labs  Lab 03/15/18 0805  WBC 5.2  NEUTROABS 3.4  HGB 12.8  HCT 40.0  MCV 93.0  PLT 164    Cardiac Enzymes: No results for input(s): CKTOTAL, CKMB, CKMBINDEX, TROPONINI in the last 168 hours.  Lipid  Panel: No results for input(s): CHOL, TRIG, HDL, CHOLHDL, VLDL, LDLCALC in the last 168 hours.  Imaging:  Ct Head Wo Contrast  Result Date: 03/15/2018 CLINICAL DATA:  Fall with trauma to the back of the head and headache. EXAM: CT HEAD WITHOUT CONTRAST TECHNIQUE: Contiguous axial images were obtained from the base of the skull through the vertex without intravenous contrast. COMPARISON:  12/31/2017 FINDINGS: Brain: Chronic generalized brain atrophy. Chronic small-vessel ischemic changes of the hemispheric white matter. Old left occipital cortical and subcortical infarction. Bilateral low-density subdural hygromas, actually slightly smaller than on the previous study. No sign of any acute component. No hydrocephalus. Vascular: There is atherosclerotic calcification of the major vessels at the base of the brain. Skull: Negative.  No skull fracture. Sinuses/Orbits: Clear/normal Other: None IMPRESSION: No acute or traumatic finding. No skull fracture. No intracranial hemorrhage. Atrophy of the brain. Chronic small-vessel ischemic changes of the white matter. Old left occipital infarction. Chronic bilateral low-density subdural hygromas, slightly smaller than in February. No acute component. Electronically Signed   By: Nelson Chimes M.D.   On: 03/15/2018 08:58    Assessment and plan per attending neurologist.  Laurey Morale, NP-C [facility] [phone] 03/15/2018, 2:03 PM   Assessment: 84 year old female presenting after syncopal spell at home. Has a complaint of 3 months of diffuse weakness with worsening of the residual left sided weakness from a prior right MCA stroke. 1. Syncope - may be related to recent change of pacemaker settings, volume depletion with orthostatic hypotension or autonomic insufficiency.  2. Three month history of worsened residual left sided weakness from a prior right MCA stroke. May be due to a new stroke having occurred three months ago, versus malaise/fatigue or  other etiology for diffuse weakness resulting in unmasking of underlying neurological deficit. She also states, however, that the left sided weakness became even worse yesterday, which is suspicious for a new subacute stroke within her right cerebral hemisphere.  3. CT head shows no acute or traumatic finding. No skull fracture. No intracranial hemorrhage. Atrophy of the brain. Chronic small-vessel ischemic changes of the white matter. Old left occipital infarction. Chronic bilateral low-density subdural hygromas, slightly smaller than in February.   Recommendations: 1. Unable to perform MRI due to pacemaker 2. Obtain repeat CT head in 2 days to assess for possible subacute stroke not visible during the acute phase on initial CT 3. Carotid ultrasound 4. Echocardiogram 5. Orthostatics 6. Fluid management per primary team. If GFR imporoves, consider obtaining a CTA of head and neck.  7. Continue Xarelto.   Electronically signed: Dr. Kerney Elbe

## 2018-03-15 NOTE — ED Notes (Signed)
Attempted to walk pt to the bathroom. Pt unable to stand. Pt placed back on a Purewick.

## 2018-03-16 ENCOUNTER — Encounter (HOSPITAL_COMMUNITY): Payer: Self-pay | Admitting: Radiology

## 2018-03-16 ENCOUNTER — Observation Stay (HOSPITAL_COMMUNITY): Payer: Medicare HMO

## 2018-03-16 DIAGNOSIS — E7800 Pure hypercholesterolemia, unspecified: Secondary | ICD-10-CM | POA: Diagnosis not present

## 2018-03-16 DIAGNOSIS — R55 Syncope and collapse: Secondary | ICD-10-CM | POA: Diagnosis not present

## 2018-03-16 DIAGNOSIS — Z86711 Personal history of pulmonary embolism: Secondary | ICD-10-CM

## 2018-03-16 DIAGNOSIS — Z950 Presence of cardiac pacemaker: Secondary | ICD-10-CM

## 2018-03-16 DIAGNOSIS — N183 Chronic kidney disease, stage 3 (moderate): Secondary | ICD-10-CM | POA: Diagnosis not present

## 2018-03-16 DIAGNOSIS — I951 Orthostatic hypotension: Secondary | ICD-10-CM | POA: Diagnosis not present

## 2018-03-16 DIAGNOSIS — Z86718 Personal history of other venous thrombosis and embolism: Secondary | ICD-10-CM | POA: Diagnosis not present

## 2018-03-16 DIAGNOSIS — Z7901 Long term (current) use of anticoagulants: Secondary | ICD-10-CM | POA: Diagnosis not present

## 2018-03-16 DIAGNOSIS — I130 Hypertensive heart and chronic kidney disease with heart failure and stage 1 through stage 4 chronic kidney disease, or unspecified chronic kidney disease: Secondary | ICD-10-CM | POA: Diagnosis not present

## 2018-03-16 DIAGNOSIS — I5032 Chronic diastolic (congestive) heart failure: Secondary | ICD-10-CM | POA: Diagnosis not present

## 2018-03-16 DIAGNOSIS — Z8673 Personal history of transient ischemic attack (TIA), and cerebral infarction without residual deficits: Secondary | ICD-10-CM

## 2018-03-16 LAB — LIPID PANEL
CHOL/HDL RATIO: 3.4 ratio
CHOLESTEROL: 166 mg/dL (ref 0–200)
HDL: 49 mg/dL (ref >40)
LDL CALC: 100 mg/dL — AB (ref 0–99)
TRIGLYCERIDES: 86 mg/dL (ref <150)
VLDL: 17 mg/dL (ref 0–40)

## 2018-03-16 LAB — URINE CULTURE

## 2018-03-16 LAB — CBC
HCT: 38.2 % (ref 36.0–46.0)
Hemoglobin: 12.4 g/dL (ref 12.0–15.0)
MCH: 29.9 pg (ref 26.0–34.0)
MCHC: 32.5 g/dL (ref 30.0–36.0)
MCV: 92 fL (ref 78.0–100.0)
PLATELETS: 163 10*3/uL (ref 150–400)
RBC: 4.15 MIL/uL (ref 3.87–5.11)
RDW: 13.9 % (ref 11.5–15.5)
WBC: 4.2 10*3/uL (ref 4.0–10.5)

## 2018-03-16 LAB — BASIC METABOLIC PANEL
Anion gap: 7 (ref 5–15)
BUN: 14 mg/dL (ref 6–20)
CALCIUM: 8.6 mg/dL — AB (ref 8.9–10.3)
CO2: 23 mmol/L (ref 22–32)
CREATININE: 0.94 mg/dL (ref 0.44–1.00)
Chloride: 111 mmol/L (ref 101–111)
GFR calc Af Amer: 60 mL/min — ABNORMAL LOW (ref >60)
GFR calc non Af Amer: 51 mL/min — ABNORMAL LOW (ref >60)
Glucose, Bld: 93 mg/dL (ref 65–99)
Potassium: 3.9 mmol/L (ref 3.5–5.1)
SODIUM: 141 mmol/L (ref 135–145)

## 2018-03-16 LAB — TROPONIN I
Troponin I: <0.03 ng/mL (ref <0.03)
Troponin I: <0.03 ng/mL (ref <0.03)

## 2018-03-16 LAB — GLUCOSE, CAPILLARY: Glucose-Capillary: 90 mg/dL (ref 65–99)

## 2018-03-16 LAB — VITAMIN B12: Vitamin B-12: 1217 pg/mL — ABNORMAL HIGH (ref 180–914)

## 2018-03-16 LAB — HEMOGLOBIN A1C
HEMOGLOBIN A1C: 5.5 % (ref 4.8–5.6)
Mean Plasma Glucose: 111.15 mg/dL

## 2018-03-16 MED ORDER — FUROSEMIDE 20 MG PO TABS
20.0000 mg | ORAL_TABLET | ORAL | Status: DC
  Administered 2018-03-16: 20 mg via ORAL
  Filled 2018-03-16: qty 1

## 2018-03-16 MED ORDER — FUROSEMIDE 20 MG PO TABS: 20.0000 mg | ORAL_TABLET | ORAL | Status: DC

## 2018-03-16 MED ORDER — PRAVASTATIN SODIUM 20 MG PO TABS
20.0000 mg | ORAL_TABLET | Freq: Every day | ORAL | Status: DC
  Administered 2018-03-17: 20 mg via ORAL
  Filled 2018-03-16: qty 1

## 2018-03-16 MED ORDER — IOPAMIDOL (ISOVUE-370) INJECTION 76%: 50.0000 mL | Freq: Once | INTRAVENOUS | Status: DC | PRN

## 2018-03-16 MED ORDER — SENNA 8.6 MG PO TABS
1.0000 | ORAL_TABLET | Freq: Every day | ORAL | Status: DC | PRN
  Administered 2018-03-16: 8.6 mg via ORAL
  Filled 2018-03-16: qty 1

## 2018-03-16 MED ORDER — VERAPAMIL HCL ER 240 MG PO TBCR
240.0000 mg | EXTENDED_RELEASE_TABLET | Freq: Every day | ORAL | Status: DC
  Filled 2018-03-16 (×3): qty 1

## 2018-03-16 MED ORDER — SENNOSIDES 15 MG PO CHEW: 1.0000 | CHEWABLE_TABLET | ORAL | Status: DC | PRN

## 2018-03-16 MED ORDER — IOPAMIDOL (ISOVUE-370) INJECTION 76%
INTRAVENOUS | Status: AC
  Filled 2018-03-16: qty 50

## 2018-03-16 MED ORDER — IOPAMIDOL (ISOVUE-370) INJECTION 76%
50.0000 mL | Freq: Once | INTRAVENOUS | Status: AC | PRN
  Administered 2018-03-16: 50 mL via INTRAVENOUS

## 2018-03-16 NOTE — Care Management Obs Status (Signed)
MEDICARE OBSERVATION STATUS NOTIFICATION   Patient Details  Name: [name] [name] MRN: [id number] Date of Birth: 1924-11-01   Medicare Observation Status Notification Given:  Yes    Lawerance Sabal, RN 03/16/2018, 10:36 AM

## 2018-03-16 NOTE — Progress Notes (Signed)
PROGRESS NOTE    [name] [name]  ZOX:[id number] DOB: [birth year] DOA: 03/15/2018 PCP: Olive Bass, MD    Brief Narrative: 84 year old female with right lower extremity DVT on Xarelto and history of pacemaker due to complete AV heart block multiple other medical problems presents after an episode of syncope.  Her blood pressure medications have recently been adjusted.  Her doses were increased due to her heart rate being too fast.  He now presents relatively bradycardic concern for medication related syncope.  We will admit her and work her up for syncope and consult neurology.   Assessment & Plan:   Active Problems:   High cholesterol   Coronary artery disease prior MI   Hypertensive heart disease   History of pulmonary embolism   Presence of cardiac pacemaker   Syncope and collapse   Cerebrovascular accident (CVA) due to thrombosis of right middle cerebral artery ([facility])   Chronic kidney disease, stage III (moderate) ([facility])   History of DVT (deep vein thrombosis)   Chronic anticoagulation   Chronic diastolic heart failure ([facility])   1-syncope; history of VT.  Recently saw her cardiologist , started on verapamil.  Unable to do MRI. Neurology recommended CT angio head and neck Cardiology consulted. Defer to cardiology resumption of verapamil.   HTN; permissive until stroke is rule out.  Will also defer to cardio resumption of verapamil/   History of DVT RLE in 2014, PE  PMP . RMC artery CVA, Afib CHADSVASC 6  on Xarelto Xarelto per Pharmacy dose   Diastolic CHF: Holding lasix.   Chest pain; on and off for weeks. Check troponin. Already on anticoagulation.   COPD; stable.  CKD stage III; cr 0.9 \ DVT prophylaxis: on xarelto Code Status: DNR Family Communication: care discussed with patient.  Disposition Plan: home when stable.    Consultants:   Neurology   Cardiology    Procedures:     Antimicrobials  none   Subjective: She is feeling better,. She needs  to use bathroom. Report occasional chest pain   Objective: Vitals:   03/16/18 0819 03/16/18 1220 03/16/18 1611 03/16/18 1613  BP: (!) 181/76 (!) 159/70 (!) 179/74 (!) 166/87  Pulse: (!) 59 60 64 60  Resp: 16 18 16 16   Temp: 97.7 F (36.5 C) 98.1 F (36.7 C)    TempSrc: Oral Oral    SpO2: 98% 96% 97% 97%  Weight:        Intake/Output Summary (Last 24 hours) at 03/16/2018 1623 Last data filed at 03/16/2018 1600 Gross per 24 hour  Intake 2003.33 ml  Output 400 ml  Net 1603.33 ml   Filed Weights   03/16/18 0500  Weight: 68.9 kg (151 lb 14.4 oz)    Examination:  General exam: Appears calm and comfortable  Respiratory system: Clear to auscultation. Respiratory effort normal. Cardiovascular system: S1 & S2 heard, RRR. No JVD, murmurs, rubs, gallops or clicks. No pedal edema. Gastrointestinal system: Abdomen is nondistended, soft and nontender. No organomegaly or masses felt. Normal bowel sounds heard. Central nervous system: Alert and oriented. No focal neurological deficits. Extremities: Symmetric 5 x 5 power. Skin: No rashes, lesions or ulcers Psychiatry: Judgement and insight appear normal. Mood & affect appropriate.     Data Reviewed: I have personally reviewed following labs and imaging studies  CBC: Recent Labs  Lab 03/15/18 0805 03/16/18 0611  WBC 5.2 4.2  NEUTROABS 3.4  --   HGB 12.8 12.4  HCT 40.0 38.2  MCV 93.0 92.0  PLT 164 163   Basic Metabolic Panel: Recent Labs  Lab 03/15/18 0805 03/16/18 0611  NA 140 141  K 4.0 3.9  CL 108 111  CO2 23 23  GLUCOSE 92 93  BUN 20 14  CREATININE 1.26* 0.94  CALCIUM 9.0 8.6*   GFR: Estimated Creatinine Clearance: 36.5 mL/min (by C-G formula based on SCr of 0.94 mg/dL). Liver Function Tests: Recent Labs  Lab 03/15/18 0805  AST 37  ALT 40  ALKPHOS 52  BILITOT 0.6  PROT 6.0*  ALBUMIN 3.5   No results for input(s): LIPASE, AMYLASE in the last 168 hours. No results for input(s): AMMONIA in the last 168  hours. Coagulation Profile: Recent Labs  Lab 03/15/18 0805  INR 1.21   Cardiac Enzymes: No results for input(s): CKTOTAL, CKMB, CKMBINDEX, TROPONINI in the last 168 hours. BNP (last 3 results) No results for input(s): PROBNP in the last 8760 hours. HbA1C: Recent Labs    03/16/18 0640  HGBA1C 5.5   CBG: Recent Labs  Lab 03/16/18 0639  GLUCAP 90   Lipid Profile: Recent Labs    03/16/18 0611  CHOL 166  HDL 49  LDLCALC 100*  TRIG 86  CHOLHDL 3.4   Thyroid Function Tests: Recent Labs    03/15/18 1428  TSH 1.008   Anemia Panel: Recent Labs    03/16/18 0640  VITAMINB12 1,217*   Sepsis Labs: No results for input(s): PROCALCITON, LATICACIDVEN in the last 168 hours.  Recent Results (from the past 240 hour(s))  Urine culture     Status: Abnormal   Collection Time: 03/15/18  7:29 AM  Result Value Ref Range Status   Specimen Description URINE, CLEAN CATCH  Final   Special Requests NONE  Final   Culture (A)  Final    <10,000 COLONIES/mL INSIGNIFICANT GROWTH Performed at [facility], [address], [place], [place] [address]    Report Status 03/16/2018 FINAL  Final         Radiology Studies: Ct Head Wo Contrast  Result Date: 03/15/2018 CLINICAL DATA:  Fall with trauma to the back of the head and headache. EXAM: CT HEAD WITHOUT CONTRAST TECHNIQUE: Contiguous axial images were obtained from the base of the skull through the vertex without intravenous contrast. COMPARISON:  12/31/2017 FINDINGS: Brain: Chronic generalized brain atrophy. Chronic small-vessel ischemic changes of the hemispheric white matter. Old left occipital cortical and subcortical infarction. Bilateral low-density subdural hygromas, actually slightly smaller than on the previous study. No sign of any acute component. No hydrocephalus. Vascular: There is atherosclerotic calcification of the major vessels at the base of the brain. Skull: Negative.  No skull fracture. Sinuses/Orbits:  Clear/normal Other: None IMPRESSION: No acute or traumatic finding. No skull fracture. No intracranial hemorrhage. Atrophy of the brain. Chronic small-vessel ischemic changes of the white matter. Old left occipital infarction. Chronic bilateral low-density subdural hygromas, slightly smaller than in February. No acute component. Electronically Signed   By: Paulina Fusi M.D.   On: 03/15/2018 08:58   Dg Foot 2 Views Left  Result Date: 03/15/2018 CLINICAL DATA:  Generalized left foot pain after fall in the bathroom this morning. No history of previous injuries. EXAM: LEFT FOOT - 2 VIEW COMPARISON:  Left foot series dated August 21, 2011 FINDINGS: The bones are subjectively osteopenic. The phalanges are intact as are the metatarsals. The interphalangeal joints and the MTP joints are reasonably well-maintained. The tarsal bones appear intact. The intertarsal and tarsometatarsal joints are grossly normal. The soft tissues are  unremarkable. Where visualized the distal tibia and fibula appear normal. IMPRESSION: There is no acute bony abnormality of the right foot. There is subjective diffuse osteopenia which appears to have progressed since the previous study. Electronically Signed   By: David  [place] M.D.   On: 03/15/2018 16:52        Scheduled Meds: . rivaroxaban  15 mg Oral Q supper   Continuous Infusions: . sodium chloride 100 mL/hr at 03/16/18 0639     LOS: 0 days    Time spent: 35 minutes.     Alba Cory, MD [facility] Pager [phone]  If 7PM-7AM, please contact night-coverage [email or web address] Password [facility] 03/16/2018, 4:23 PM

## 2018-03-16 NOTE — Consult Note (Addendum)
Cardiology Consultation:   Patient ID: [name] [name]; [id number]; 08-24-1924   Admit date: 03/15/2018 Date of Consult: 03/16/2018  Primary Care Provider: Algis Greenhouse, MD Primary Cardiologist: Shirlee More, MD  Primary Electrophysiologist:  Dr. Caryl Comes   Patient Profile:   [name] [name] is a 84 y.o. female with a hx of paroxysmal atrial fibrillation, DVT 11/2017 on chronic anticoagulation with low dose xarelto, complete heart block s/p PPM in place (05/2015), hypertensive heart disease without heart failure, orthostatic hypotension, CAD (qeustionable MI in 1975, nonobstuctive cath in 2011), hx of CVA x 2 and chronic diastolic heart failure who is being seen today for the evaluation of syncope at the request of Dr. Tyrell Antonio.  History of Present Illness:   Ms. Lok is followed by Dr. Bettina Gavia and Dr. Caryl Comes. She recently saw Dr. Bettina Gavia in clinic on 02/11/18. At that time, she ws doing well but had some elevated blood pressure and edema. She restarted 20 mg lasix 3x per week. Verapamil was also added 240 mg for high supraventricular ectopic burden.   She was hospitalized for weakness, treated for CAP and UTI 11/2017. Recurrent right DVT (2014, 11/2017) on xarelto. Cardizem was started that admission for rate control of Afib.  She was hospitalized 01/01/18 with orthostatic syncope and all BP meds (norvasc and lisinopril) and lasix were stopped.  She ws hypertensive in the 140-150s, but orthostatic to the 120s upon standing.   She presented to [facility] on 03/15/18 after an episode of syncope in the bathroom. She woke up to go to the bathroom at 0530 but could not reach her cane. She walked to the bathroom using the wall and then weighed herself. She attempted to void, but felt nauseous and called out to her granddaughter. She felt generalized weakness and lost consciousness. On arrival, CT head without acute trauma, stroke, or hemorrhage. She apparently had a similar event last week when she was on the toilet  and became dizzy and passed out.   Device interrogated without significant events. Atrial tachycardia has significantly decreased with the start of verapamil. Pressure was elevated this morning in the 180s. On 03/15/18, orthostatic vitals were negative for hypotension. She reports having trouble breathing, but this has been substantially better after starting lasix 3x weekly. She also states she has felt weak for the past 2 months.    Past Medical History:  Diagnosis Date  . Acute deep vein thrombosis (DVT) of right peroneal vein ([place]) 11/30/2017   Overview:  9357: uncomplicated, xarelto  . AKI (acute kidney injury) ([place]) 01/01/2018  . Arthritis   . Atrial fibrillation ([place]) 02/08/2016  . Benign essential HTN 01/15/2016  . Bradycardia with 31 - 40 beats per minute 05/14/2015  . Bronchiectasis, non-tuberculous ([place])    a. Followed by Dr. Joya Gaskins.  . Cerebrovascular accident (CVA) due to thrombosis of right middle cerebral artery ([place]) 02/07/2016   Overview:  2017: R-MCA thrombosis with left non-dom hemiparesis, thalamic capsule ischemia 2018: recurrent  . Chest pain with low risk for cardiac etiology 11/18/2015  . Chronic kidney disease, stage III (moderate) ([place]) 01/15/2016  . Chronic pain of left knee 12/07/2016  . Closed compression fracture of fourth lumbar vertebra ([place]) 01/19/2017   Overview:  2015  . Closed fracture of ramus of left pubis ([place]) 06/18/2017   Overview:  2018  . Colon polyps   . Complete atrioventricular block ([place]) 01/15/2016   Overview:  2016: PM  . Complete heart block ([place])    a. s/p MDT dual chamber  PPM   . COPD (chronic obstructive pulmonary disease) ([place])    past hx with pneumonia  . Coronary artery disease prior MI   . Diverticulosis   . DVT (deep venous thrombosis) ([facility])    Resolved, no anticoagulation currently  . Dyspnea on exertion 10/12/2017   Overview:  2018  . Edema -lower extremity 06/22/2013  . Fatigue 08/19/2012  . GERD (gastroesophageal reflux disease)   .  Hammer toe of left foot 06/07/2017  . Headache   . Heart block   . Hemiparesis of left nondominant side as late effect of cerebral infarction ([place]) 12/22/2016  . High cholesterol   . History of pulmonary embolism 08/30/2012   PE 2013  >>repeat CT Angio chest 12/2013>>>NO PE   . Hypertension   . IBS (irritable bowel syndrome)   . Left foot pain 06/07/2017  . Left leg pain 08/03/2016   Overview:  2017: related to weakness left leg from stroke  . Lumbar radiculopathy 07/27/2017  . 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 Malaise and fatigue 10/27/2017  . MI (myocardial infarction) ?? probably not 1975   a. 1975 Reported MI;  b. 2011 Cath: nonobs dzs;  c. 2013 Nonischemic Myoview;  d. 12/2012 Echo: EF 60-65%, Gr1 DD.  . Nephrolithiasis   . Orthostatic syncope 01/01/2018  . Osteoarthritis of right knee 12/12/2012  . Osteoporosis 08/01/2016  . Pain in limb 08/16/2013  . Pain of left femur 06/17/2017   Overview:  2018  . Palpitation    a. 2010 Event Monitor: PACs and PVCs  . Panlobular emphysema ([place]) 01/15/2016  . Paroxysmal supraventricular tachycardia ([place]) 01/15/2016  . Pneumonia   . PONV (postoperative nausea and vomiting)   . Post-dural puncture headache 09/01/2017  . Presence of cardiac pacemaker 01/16/2016   July 5,2016 Overview:  2016: complete AVB  . Presence of permanent cardiac pacemaker   . Pulmonary fibrosis ([place])   . S/P left knee arthroscopy 04/01/2017  . S/P TKR (total knee replacement) using cement, right 12/07/2016  . Stroke ([place])   . Varicose veins of lower extremities with other complications 41/07/6221  . Vitamin D deficiency 08/01/2016  . Weakness generalized 11/25/2017  . Wears dentures    full  . Wide-complex tachycardia ([place])    a. Followed by Dr. Caryl Comes, "I think this is VT, but i am not sure QRSd 135 1:1 AV."    Past Surgical History:  Procedure Laterality Date  . APPENDECTOMY    . BLADDER SUSPENSION     tack  . CARDIAC CATHETERIZATION Right 05/14/2015     Procedure: Temporary Pacemaker;  Surgeon: Thompson Grayer, MD;  Location: [facility];  Service: Cardiovascular;  Laterality: Right;  . CHOLECYSTECTOMY    . COLON SURGERY     12inches colectomy s/p diverticulitis  . COLONOSCOPY W/ BIOPSIES AND POLYPECTOMY    . EP IMPLANTABLE DEVICE N/A 05/14/2015   Procedure: Pacemaker Implant;  Surgeon: Thompson Grayer, MD; [place] 1 (serial number [id number])     . IR FL GUIDED LOC OF NEEDLE/CATH TIP FOR SPINAL INJECTION LT  09/06/2017  . KNEE ARTHROSCOPY Left 03/29/2017   Procedure: ARTHROSCOPY LEFT KNEE WITH PARTIAL MEDIAL AND PARTIAL LATERAL MENISCECTOMY;  Surgeon: Vickey Huger, MD;  Location: [facility];  Service: Orthopedics;  Laterality: Left;  Marland Kitchen MULTIPLE TOOTH EXTRACTIONS    . TONSILLECTOMY    . TOTAL ABDOMINAL HYSTERECTOMY    . TOTAL KNEE ARTHROPLASTY  12/12/2012   Procedure: TOTAL KNEE ARTHROPLASTY;  Surgeon: Alta Corning, MD;  Location: [facility];  Service: Orthopedics;  Laterality: Right;  right total knee arthroplasty     Home Medications:  Prior to Admission medications   Medication Sig Start Date End Date Taking? Authorizing Provider  furosemide (LASIX) 20 MG tablet Take 1 tablet (20 mg total) by mouth 3 (three) times a week. 02/11/18 05/12/18 Yes Richardo Priest, MD  Sennosides 15 MG CHEW Chew 1 tablet by mouth as needed (constipation).   Yes [provider]  verapamil (CALAN-SR) 240 MG CR tablet Take 1 tablet (240 mg total) by mouth at bedtime. 03/07/18  Yes Richardo Priest, MD  vitamin B-12 (CYANOCOBALAMIN) 1000 MCG tablet Take 1,000 mcg by mouth daily.   Yes [provider]  XARELTO 15 MG TABS tablet TAKE 1 TABLET (15 MG TOTAL) BY MOUTH DAILY. STROKE PREVENTION, BLOOD THINNER 01/28/18  Yes [provider]    Inpatient Medications: Scheduled Meds: . rivaroxaban  15 mg Oral Q supper   Continuous Infusions: . sodium chloride 100 mL/hr at 03/16/18 0639   PRN Meds: alum & mag hydroxide-simeth,  iopamidol, ondansetron **OR** ondansetron (ZOFRAN) IV, senna  Allergies:    Allergies  Allergen Reactions  . Crestor [Rosuvastatin] Other (See Comments)    Myalgia  . Darvon Other (See Comments)    Drop in  BP  . Meloxicam Other (See Comments)    Dizziness   . Meperidine Other (See Comments)    Hypotension  . Prednisone Diarrhea    POLYURIA  . Valsartan Other (See Comments)     Hypotension  . Amiodarone Other (See Comments)    Did not tolerate - unknown reaction  . Aspirin-Acetaminophen-Caffeine Other (See Comments)    Unknown reaction  . Nitroglycerin     Profound hypotension with sublingual nitroglycerin.   . Tramadol Other (See Comments)    Unknown reaction    Social History:   Social History   Socioeconomic History  . Marital status: Married    Spouse name: Not on file  . Number of children: 3  . Years of education: Not on file  . Highest education level: Not on file  Occupational History  . Occupation: Retired    Comment: Musician  Social Needs  . Financial resource strain: Not on file  . Food insecurity:    Worry: Not on file    Inability: Not on file  . Transportation needs:    Medical: Not on file    Non-medical: Not on file  Tobacco Use  . Smoking status: Never Smoker  . Smokeless tobacco: Never Used  Substance and Sexual Activity  . Alcohol use: No  . Drug use: No  . Sexual activity: Not on file  Lifestyle  . Physical activity:    Days per week: Not on file    Minutes per session: Not on file  . Stress: Not on file  Relationships  . Social connections:    Talks on phone: Not on file    Gets together: Not on file    Attends religious service: Not on file    Active member of club or organization: Not on file    Attends meetings of clubs or organizations: Not on file    Relationship status: Not on file  . Intimate partner violence:    Fear of current or ex partner: Not on file    Emotionally abused: Not on file    Physically  abused: Not on file    Forced sexual activity:  Not on file  Other Topics Concern  . Not on file  Social History Narrative  . Not on file    Family History:    Family History  Problem Relation Age of Onset  . Heart disease Father   . Heart attack Mother   . Stomach cancer Brother   . Other Son        Triple Bypass     ROS:  Please see the history of present illness.   All other ROS reviewed and negative.     Physical Exam/Data:   Vitals:   03/16/18 0500 03/16/18 0755 03/16/18 0819 03/16/18 1220  BP:  (!) 181/76 (!) 181/76 (!) 159/70  Pulse:  (!) 59 (!) 59 60  Resp:  16 16 18   Temp:  97.7 F (36.5 C) 97.7 F (36.5 C) 98.1 F (36.7 C)  TempSrc:  Oral Oral Oral  SpO2:  98% 98% 96%  Weight: 151 lb 14.4 oz (68.9 kg)       Intake/Output Summary (Last 24 hours) at 03/16/2018 1511 Last data filed at 03/16/2018 0800 Gross per 24 hour  Intake 1203.33 ml  Output 400 ml  Net 803.33 ml   Filed Weights   03/16/18 0500  Weight: 151 lb 14.4 oz (68.9 kg)   Body mass index is 24.52 kg/m.  General:  Elderly female in no acute distress HEENT: normal Neck: no JVD Vascular: No carotid bruits Cardiac:  normal S1, S2; RRR; no murmur Lungs:  clear to auscultation bilaterally, no wheezing, rhonchi or rales  Abd: soft, nontender, no hepatomegaly  Ext: no edema Musculoskeletal:  No deformities, BUE and BLE strength normal and equal Skin: warm and dry  Neuro:  CNs 2-12 intact, no focal abnormalities noted Psych:  Normal affect   EKG:  The EKG was personally reviewed and demonstrates:  Paced rhythm Telemetry:  Telemetry was personally reviewed and demonstrates:  Paced rhythm  Relevant CV Studies:  Echo 11/26/17: Study Conclusions - Left ventricle: The cavity size was normal. Systolic function was   normal. The estimated ejection fraction was in the range of 50%   to 55%. Wall motion was normal; there were no regional wall   motion abnormalities. There was an increased  relative   contribution of atrial contraction to ventricular filling.   Doppler parameters are consistent with abnormal left ventricular   relaxation (grade 1 diastolic dysfunction). - Aortic valve: Trileaflet; normal thickness, mildly calcified   leaflets. - Mitral valve: There was mild regurgitation. - Impressions: The findings indicate significant septal-lateral   left ventricular wall dyssynchrony due to RV pacing  Impressions: - Low normal LVF with EF 50-55% with septal lateral wall   dyssynchrony due to RV pacing, mild TR and MR, grade 1 DD> The   findings indicate significant septal-lateral left ventricular   wall dyssynchrony due to RV pacing   Myoview 06/11/15:  Nuclear stress EF: 60%.  The study is normal.  This is a low risk study.  The left ventricular ejection fraction is normal (55-65%).   Laboratory Data:  Chemistry Recent Labs  Lab 03/15/18 0805 03/16/18 0611  NA 140 141  K 4.0 3.9  CL 108 111  CO2 23 23  GLUCOSE 92 93  BUN 20 14  CREATININE 1.26* 0.94  CALCIUM 9.0 8.6*  GFRNONAA 36* 51*  GFRAA 42* 60*  ANIONGAP 9 7    Recent Labs  Lab 03/15/18 0805  PROT 6.0*  ALBUMIN 3.5  AST 37  ALT 40  ALKPHOS 52  BILITOT 0.6   Hematology Recent Labs  Lab 03/15/18 0805 03/16/18 0611  WBC 5.2 4.2  RBC 4.30 4.15  HGB 12.8 12.4  HCT 40.0 38.2  MCV 93.0 92.0  MCH 29.8 29.9  MCHC 32.0 32.5  RDW 14.0 13.9  PLT 164 163   Cardiac EnzymesNo results for input(s): TROPONINI in the last 168 hours.  Recent Labs  Lab 03/15/18 0827  TROPIPOC 0.00    BNPNo results for input(s): BNP, PROBNP in the last 168 hours.  DDimer No results for input(s): DDIMER in the last 168 hours.  Radiology/Studies:  Ct Head Wo Contrast  Result Date: 03/15/2018 CLINICAL DATA:  Fall with trauma to the back of the head and headache. EXAM: CT HEAD WITHOUT CONTRAST TECHNIQUE: Contiguous axial images were obtained from the base of the skull through the vertex without  intravenous contrast. COMPARISON:  12/31/2017 FINDINGS: Brain: Chronic generalized brain atrophy. Chronic small-vessel ischemic changes of the hemispheric white matter. Old left occipital cortical and subcortical infarction. Bilateral low-density subdural hygromas, actually slightly smaller than on the previous study. No sign of any acute component. No hydrocephalus. Vascular: There is atherosclerotic calcification of the major vessels at the base of the brain. Skull: Negative.  No skull fracture. Sinuses/Orbits: Clear/normal Other: None IMPRESSION: No acute or traumatic finding. No skull fracture. No intracranial hemorrhage. Atrophy of the brain. Chronic small-vessel ischemic changes of the white matter. Old left occipital infarction. Chronic bilateral low-density subdural hygromas, slightly smaller than in February. No acute component. Electronically Signed   By: Nelson Chimes M.D.   On: 03/15/2018 08:58   Dg Foot 2 Views Left  Result Date: 03/15/2018 CLINICAL DATA:  Generalized left foot pain after fall in the bathroom this morning. No history of previous injuries. EXAM: LEFT FOOT - 2 VIEW COMPARISON:  Left foot series dated August 21, 2011 FINDINGS: The bones are subjectively osteopenic. The phalanges are intact as are the metatarsals. The interphalangeal joints and the MTP joints are reasonably well-maintained. The tarsal bones appear intact. The intertarsal and tarsometatarsal joints are grossly normal. The soft tissues are unremarkable. Where visualized the distal tibia and fibula appear normal. IMPRESSION: There is no acute bony abnormality of the right foot. There is subjective diffuse osteopenia which appears to have progressed since the previous study. Electronically Signed   By: David  Martinique M.D.   On: 03/15/2018 16:52    Assessment and Plan:   1. Syncope - orthostatic vitals were negative for hypotension on this admission - this is the second time she has passed out in the past week - device  interrogation negative for new arrhythmias or abnormalities around the time of her event that may explain her syncope - it is unclear if her syncope is related to orthostatic hypotension - she has been positive for this during past hospitalizations, but negative here while off medications - recheck another set of orthostatic vitals today after verapamil and lasix are resumed   2. High supraventricular ectopic burden noted on PPM interrogation 02/2018 - as per device interrogation, verapamil has signifncatly reduced this burden - she reported feeling week prior to this medication change, so I suspect this is unrelated - will restart verapamil   3. HTN - pressures have been elevated 160-180s today - BP meds have been held for orthostatics, although orthostatic vitals were normal - may need antihypertensive medication adjustment, but she has not been getting her verapamil - will resume this   4. Chronic diastolic heart failure - grade 1 DD  by echo 11/2017 - will check another echo to evaluate new changes in structure or function over the past 2 months - she feels significantly better from a breathing perspective since starting lasix 3 times per week - she is very compliant on daily weights - resume lasix 20 mg three times weekly for now, will reduce to twice weekly - creatinine and K stable - encouraged low salt diet    For questions or updates, please contact [place] Please consult [email or web address] for contact info under Cardiology/STEMI.   Signed, Ledora Bottcher, [place]  03/16/2018 3:11 PM   Attending Note:   The patient was seen and examined.  Agree with assessment and plan as noted above.  Changes made to the above note as needed.  Patient seen and independently examined with Doreene Adas, Pa on Mar 16, 2018 .   We discussed all aspects of the encounter. I agree with the assessment and plan as stated above.  1.  Syncope: Her symptoms of syncope are consistent with orthostatic  hypotension.  While she is breathing better on Lasix 20 mg 3 times a week, this might be too much for her.  She is 84 years old and likely has very calcified vessels.  We may have to settle for her blood pressure being somewhat higher than we would ordinarily like to prevent her from having symptoms of orthostatic hypotension and syncope.  Syncopal episode in this patient would put her at very high risk for hip fracture or other orthopedic injury.   I have spent a total of 40 minutes with patient reviewing hospital  notes , telemetry, EKGs, labs and examining patient as well as establishing an assessment and plan that was discussed with the patient. > 50% of time was spent in direct patient care.    Thayer Headings, Brooke Bonito., MD, [facility] 03/17/2018, 3:01 PM [id number]  [place] Pager [phone]

## 2018-03-16 NOTE — Evaluation (Signed)
Physical Therapy Evaluation Patient Details Name: [name] [name] MRN: [id number] DOB: [birth year] Today's Date: 03/16/2018   History of Present Illness  Pt is a 84 y/o female admitted secondary to syncope and dizziness. CT negative for acute abnormality. PMH includes CAD, MI, CVA with L sided weakness, DVT, a fib, HTN, R TKA, and s/p pacemaker placement.   Clinical Impression  Pt admitted secondary to problem above with deficits below. Ambulation limited to within the room as pt fatigued from ambulating to bathroom and back prior to entry. Required min to min guard A for mobility using RW. Reports family will be able to assist 24/7 at home upon d/c, and pt very adamant about returning home at d/c. Will continue to follow acutely to maximize functional mobility independence and safety.     Follow Up Recommendations Home health PT;Supervision/Assistance - 24 hour    Equipment Recommendations  None recommended by PT    Recommendations for Other Services OT consult     Precautions / Restrictions Precautions Precautions: Fall Restrictions Weight Bearing Restrictions: No      Mobility  Bed Mobility Overal bed mobility: Needs Assistance Bed Mobility: Supine to Sit;Sit to Supine     Supine to sit: Supervision Sit to supine: Supervision   General bed mobility comments: Supervision for safety. Increased time required to perform.   Transfers Overall transfer level: Needs assistance Equipment used: Rolling walker (2 wheeled) Transfers: Sit to/from Stand Sit to Stand: Min assist         General transfer comment: Min a for lift assist and steadying. Verbal cues for safe hand placement.   Ambulation/Gait Ambulation/Gait assistance: Min guard;Min assist Ambulation Distance (Feet): 20 Feet Assistive device: Rolling walker (2 wheeled) Gait Pattern/deviations: Step-through pattern;Decreased stride length;Trendelenburg(tendelenburg on L secondary to L weakness) Gait velocity: Decreased   Gait velocity interpretation: <1.31 ft/sec, indicative of household ambulator General Gait Details: Slow, guarded gait. Min to min guard for steadying assist with RW. Pt limited secondary to fatigue, as pt just ambulated to bathroom and back.   Stairs            Wheelchair Mobility    Modified Rankin (Stroke Patients Only)       Balance Overall balance assessment: Needs assistance Sitting-balance support: No upper extremity supported;Feet supported Sitting balance-Leahy Scale: Fair     Standing balance support: Bilateral upper extremity supported;During functional activity Standing balance-Leahy Scale: Poor Standing balance comment: Reliant on BUE support.                              Pertinent Vitals/Pain Pain Assessment: No/denies pain    Home Living Family/patient expects to be discharged to:: Private residence Living Arrangements: Spouse/significant other Available Help at Discharge: Family;Available 24 hours/day Type of Home: House Home Access: Stairs to enter Entrance Stairs-Rails: Right Entrance Stairs-Number of Steps: 2 Home Layout: Two level;Able to live on main level with bedroom/bathroom Home Equipment: Dan Humphreys - 2 wheels;Walker - 4 wheels;Bedside commode;Cane - single point;Shower seat - built in      Prior Function Level of Independence: Independent with assistive device(s)         Comments: Used cane for ambulation inside and rollator for ambulation outside.      Hand Dominance   Dominant Hand: Right    Extremity/Trunk Assessment   Upper Extremity Assessment Upper Extremity Assessment: Defer to OT evaluation    Lower Extremity Assessment Lower Extremity Assessment: Generalized weakness(L residual weakness from CVA)  Cervical / Trunk Assessment Cervical / Trunk Assessment: Kyphotic  Communication   Communication: No difficulties  Cognition Arousal/Alertness: Awake/alert Behavior During Therapy: WFL for tasks  assessed/performed Overall Cognitive Status: Within Functional Limits for tasks assessed                                        General Comments General comments (skin integrity, edema, etc.): Pt's ex daughter in law present in the room.     Exercises     Assessment/Plan    PT Assessment Patient needs continued PT services  PT Problem List Decreased strength;Decreased activity tolerance;Decreased balance;Decreased mobility;Decreased knowledge of use of DME;Decreased knowledge of precautions       PT Treatment Interventions DME instruction;Gait training;Functional mobility training;Therapeutic activities;Therapeutic exercise;Stair training;Balance training;Patient/family education    PT Goals (Current goals can be found in the Care Plan section)  Acute Rehab PT Goals Patient Stated Goal: to go home  PT Goal Formulation: With patient Time For Goal Achievement: 03/30/18 Potential to Achieve Goals: Good    Frequency Min 3X/week   Barriers to discharge        Co-evaluation               AM-PAC PT "6 Clicks" Daily Activity  Outcome Measure Difficulty turning over in bed (including adjusting bedclothes, sheets and blankets)?: A Little Difficulty moving from lying on back to sitting on the side of the bed? : A Little Difficulty sitting down on and standing up from a chair with arms (e.g., wheelchair, bedside commode, etc,.)?: Unable Help needed moving to and from a bed to chair (including a wheelchair)?: A Little Help needed walking in hospital room?: A Little Help needed climbing 3-5 steps with a railing? : A Lot 6 Click Score: 15    End of Session Equipment Utilized During Treatment: Gait belt Activity Tolerance: Patient limited by fatigue Patient left: in bed;with call bell/phone within reach;with family/visitor present Nurse Communication: Mobility status PT Visit Diagnosis: Unsteadiness on feet (R26.81);Muscle weakness (generalized) (M62.81)     Time: 4540-9811 PT Time Calculation (min) (ACUTE ONLY): 20 min   Charges:   PT Evaluation $PT Eval Moderate Complexity: 1 Mod     PT G Codes:        Gladys Damme, PT, DPT  Acute Rehabilitation Services  Pager: [phone]   Lehman Prom 03/16/2018, 4:49 PM

## 2018-03-16 NOTE — Progress Notes (Addendum)
STROKE TEAM PROGRESS NOTE   INTERVAL HISTORY  Patient reports she was walking to the bathroom yesterday when she sat down on the toilet with the lid closed because she knew she did not feel well.  She called out to her granddaughter for help.  Her granddaughter rushed in.  Patient reports she apparently had passed out.  She has no memory of the event.  She had a similar episode within the past 2 weeks while she was on the toilet where she became dizzy and passed out.  Overall, she reports progressive weakness over the past 3 to 4 months.  She states she has had difficulty breathing but no shortness of breath.   Vitals:   03/15/18 2345 03/16/18 0402 03/16/18 0500 03/16/18 0819  BP: (!) 144/66 (!) 147/77  (!) 181/76  Pulse: 60 61  (!) 59  Resp: 16 16  16   Temp: (!) 97.4 F (36.3 C) 97.9 F (36.6 C)  97.7 F (36.5 C)  TempSrc: Oral Oral  Oral  SpO2: 94% 94%  98%  Weight:   68.9 kg (151 lb 14.4 oz)    CBC:  CBC Latest Ref Rng & Units 03/16/2018 03/15/2018 01/18/2018  WBC 4.0 - 10.5 K/uL 4.2 5.2 4.9  Hemoglobin 12.0 - 15.0 g/dL 16.1 09.6 04.5  Hematocrit 36.0 - 46.0 % 38.2 40.0 43.1  Platelets 150 - 400 K/uL 163 164 PLATELET CLUMPS NOTED ON SMEAR, UNABLE TO ESTIMATE    Comprehensive Metabolic Panel:   CMP Latest Ref Rng & Units 03/16/2018 03/15/2018 01/18/2018  Glucose 65 - 99 mg/dL 93 92 90  BUN 6 - 20 mg/dL 14 20 15   Creatinine 0.44 - 1.00 mg/dL 4.09 8.11(B) 1.47(W)  Sodium 135 - 145 mmol/L 141 140 140  Potassium 3.5 - 5.1 mmol/L 3.9 4.0 4.2  Chloride 101 - 111 mmol/L 111 108 105  CO2 22 - 32 mmol/L 23 23 25   Calcium 8.9 - 10.3 mg/dL 2.9(F) 9.0 9.6  Total Protein 6.5 - 8.1 g/dL - 6.0(L) 6.3(L)  Total Bilirubin 0.3 - 1.2 mg/dL - 0.6 0.5  Alkaline Phos 38 - 126 U/L - 52 49  AST 15 - 41 U/L - 37 25  ALT 14 - 54 U/L - 40 12(L)   Lipid Panel:     Component Value Date/Time   CHOL 166 03/16/2018 0611   TRIG 86 03/16/2018 0611   HDL 49 03/16/2018 0611   CHOLHDL 3.4 03/16/2018 0611   VLDL  17 03/16/2018 0611   LDLCALC 100 (H) 03/16/2018 0611   HgbA1c:  Lab Results  Component Value Date   HGBA1C 5.5 03/16/2018   Urine Drug Screen: No results found for: LABOPIA, COCAINSCRNUR, LABBENZ, AMPHETMU, THCU, LABBARB  Alcohol Level No results found for: ETH  IMAGING Ct Head Wo Contrast  Result Date: 03/15/2018 CLINICAL DATA:  Fall with trauma to the back of the head and headache. EXAM: CT HEAD WITHOUT CONTRAST TECHNIQUE: Contiguous axial images were obtained from the base of the skull through the vertex without intravenous contrast. COMPARISON:  12/31/2017 FINDINGS: Brain: Chronic generalized brain atrophy. Chronic small-vessel ischemic changes of the hemispheric white matter. Old left occipital cortical and subcortical infarction. Bilateral low-density subdural hygromas, actually slightly smaller than on the previous study. No sign of any acute component. No hydrocephalus. Vascular: There is atherosclerotic calcification of the major vessels at the base of the brain. Skull: Negative.  No skull fracture. Sinuses/Orbits: Clear/normal Other: None IMPRESSION: No acute or traumatic finding. No skull fracture. No intracranial hemorrhage.  Atrophy of the brain. Chronic small-vessel ischemic changes of the white matter. Old left occipital infarction. Chronic bilateral low-density subdural hygromas, slightly smaller than in February. No acute component. Electronically Signed   By: Paulina Fusi M.D.   On: 03/15/2018 08:58   Dg Foot 2 Views Left  Result Date: 03/15/2018 CLINICAL DATA:  Generalized left foot pain after fall in the bathroom this morning. No history of previous injuries. EXAM: LEFT FOOT - 2 VIEW COMPARISON:  Left foot series dated August 21, 2011 FINDINGS: The bones are subjectively osteopenic. The phalanges are intact as are the metatarsals. The interphalangeal joints and the MTP joints are reasonably well-maintained. The tarsal bones appear intact. The intertarsal and tarsometatarsal joints  are grossly normal. The soft tissues are unremarkable. Where visualized the distal tibia and fibula appear normal. IMPRESSION: There is no acute bony abnormality of the right foot. There is subjective diffuse osteopenia which appears to have progressed since the previous study. Electronically Signed   By: David  [place] M.D.   On: 03/15/2018 16:52    PHYSICAL EXAM HEENT-  Normocephalic, no lesions, without obvious abnormality.  Normal external eye and conjunctiva.   Cardiovascular- S1-S2 audible, pulses palpable throughout   Lungs-no rhonchi or wheezing noted, no excessive working breathing.  Saturations within normal limits Extremities- Warm, dry and intact Musculoskeletal-no joint tenderness,  or swelling, toes with some deformities noted Skin-warm and dry  Neurological Examination Mental Status: Alert, oriented, thought content appropriate.  Speech fluent without evidence of aphasia.  Able to follow 3 step commands without difficulty. Cranial Nerves: II:  Visual fields grossly normal,  III,IV, VI: ptosis not present, extra-ocular motions intact bilaterally; pupils equal, round, reactive to light V,VII: smile symmetric, facial light touch sensation normal bilaterally VIII: hearing intact to voice IX,X: No hypophonia XI: Symmetric XII: midline tongue extension Motor: Right :  Upper extremity   5/5                          Left:     Upper extremity   4/5               Lower extremity   5/5                                      Lower extremity   4/5 Normal tone throughout; no atrophy noted Sensory:  light touch intact throughout, bilaterally Plantars: Right: downgoing                           Left: downgoing Cerebellar: normal finger-to-nose; bilateral rapid alternating movements the same; normal heel-to-shin test Gait: deferred    ASSESSMENT/PLAN [name] [name] is a 84 y.o. female with history of hypertension, hyperlipidemia, A. fib, bradycardia with pacemaker, DVT on Xarelto,  COPD, depression, anxiety presenting with dizziness.   Syncope  CT head No acute stroke. Small vessel disease. Atrophy. Old L occipital infarct. ASPECTS 10.     CTA head & neck pending   MRI  / MRA  Has pacer  2D Echo  Jan 2019 EF 50-55%. No source of embolus.  RV pacing  LDL 100  HgbA1c 5.5  xarelto for VTE prophylaxis  Xarelto (rivaroxaban) daily prior to admission, resume Xarelto (rivaroxaban) daily. Note pharmacy consult placed  Check orthostatic BP  therapy recommendations:  Asked PT to see  Disposition:  Pending. Anticipate return home  Atrial Fibrillation  Home anticoagulation:  Xarelto (rivaroxaban) daily   Sent to EP following R MCA stroke in March 2017 to assess for atrial fibrillation - started on apixaban but stopped taking by December on her own accord. Changed to Rivaroxaban, then stopped (reason unclear) notes refer to brief atrial arrhythmia w/ high CHADSVASC score. Followed by Dr. [name]  Now on Xarelto for Peroneal DVT in Jan  Takes xarelto every morning with a substantial breakfast    Hypertension  Stable . BP goal normotensive  Hyperlipidemia  Home meds: None.  She has been on statins in the far past but changed to flaxseed with good results  LDL 100, goal < 70  Given progressive weakness, do not recommend statin at this time   Other Stroke Risk Factors  Advanced age  UDS / ETOH level not performed   Hx MI  Hx stroke/TIA  01/2016 ([place]->[facility]) R MCA infarct with resultant left hemiparesis. Required IP rehab.   2013  left occipital hemorrhage due to amyloid angiopathy versus hypertension   Other Active Problems  Peroneal DVT started on xarelto. hx of DVT in 2013  Vitamin B12 elevated at 1217  Complete heart block has pacemaker  Irritable bowel syndrome  divericulosis  Hospital day # 0  Annie Main, MSN, APRN, ANVP-BC, AGPCNP-BC Advanced Practice Stroke Nurse [facility] See Amion for Schedule & Pager  information 03/16/2018 2:41 PM   ATTENDING NOTE: I reviewed above note and agree with the assessment and plan. I have made any additions or clarifications directly to the above note. Pt was seen and examined.   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  She had a stroke in 01/2012 with left occipital small ICH causing right hemianopia at that time.  Since recovered over time.  In 01/2016 she had right MCA versus thalamic infarct, causing residual left-sided weakness.  At baseline she walks with rolling walker with dragging on the left side.  Patient stated that for last 3 months, she had generalized weakness.  She does not think that is related to Xarelto as it happened before Xarelto initiation.  She was admitted for episode of dizziness and passing out.  She went to bathroom and felt dizzy and she sat down on the toilet, calling for help, and then passed out.  On woken up, she felt left-sided baseline weakness seems worsened, but now has back to her baseline.  CT no acute abnormality but chronic bilateral hygroma.  MRI not able to perform due to pacemaker.  CTA head and neck and repeat CT pending.  TTE with EF 50 to 55% in 11/2017.  LDL 100 and A1c 5.5.   Patient worsening left-sided weakness likely due to stroke recrudescence in the setting of syncope, now back to baseline.  Continue Xarelto for stroke prevention and also for DVT prevention. Put on low-dose pravastatin for stroke prevention.  Will follow.  Marvel Plan, MD PhD Stroke Neurology 03/16/2018 5:35 PM   To contact Stroke Continuity provider, please refer to [email or web address]. After hours, contact General Neurology

## 2018-03-17 DIAGNOSIS — Z7901 Long term (current) use of anticoagulants: Secondary | ICD-10-CM | POA: Diagnosis not present

## 2018-03-17 DIAGNOSIS — I951 Orthostatic hypotension: Secondary | ICD-10-CM | POA: Diagnosis not present

## 2018-03-17 DIAGNOSIS — R55 Syncope and collapse: Secondary | ICD-10-CM | POA: Diagnosis not present

## 2018-03-17 DIAGNOSIS — Z86711 Personal history of pulmonary embolism: Secondary | ICD-10-CM | POA: Diagnosis not present

## 2018-03-17 DIAGNOSIS — I5032 Chronic diastolic (congestive) heart failure: Secondary | ICD-10-CM

## 2018-03-17 DIAGNOSIS — E7800 Pure hypercholesterolemia, unspecified: Secondary | ICD-10-CM | POA: Diagnosis not present

## 2018-03-17 DIAGNOSIS — Z86718 Personal history of other venous thrombosis and embolism: Secondary | ICD-10-CM | POA: Diagnosis not present

## 2018-03-17 LAB — BASIC METABOLIC PANEL
ANION GAP: 11 (ref 5–15)
BUN: 11 mg/dL (ref 6–20)
CALCIUM: 8.7 mg/dL — AB (ref 8.9–10.3)
CO2: 20 mmol/L — ABNORMAL LOW (ref 22–32)
Chloride: 109 mmol/L (ref 101–111)
Creatinine, Ser: 0.92 mg/dL (ref 0.44–1.00)
GFR, EST NON AFRICAN AMERICAN: 53 mL/min — AB (ref >60)
Glucose, Bld: 94 mg/dL (ref 65–99)
Potassium: 3.7 mmol/L (ref 3.5–5.1)
SODIUM: 140 mmol/L (ref 135–145)

## 2018-03-17 LAB — GLUCOSE, CAPILLARY: Glucose-Capillary: 91 mg/dL (ref 65–99)

## 2018-03-17 LAB — CBC
HEMATOCRIT: 38.7 % (ref 36.0–46.0)
Hemoglobin: 12.5 g/dL (ref 12.0–15.0)
MCH: 29.7 pg (ref 26.0–34.0)
MCHC: 32.3 g/dL (ref 30.0–36.0)
MCV: 91.9 fL (ref 78.0–100.0)
PLATELETS: 179 10*3/uL (ref 150–400)
RBC: 4.21 MIL/uL (ref 3.87–5.11)
RDW: 14 % (ref 11.5–15.5)
WBC: 5.8 10*3/uL (ref 4.0–10.5)

## 2018-03-17 LAB — TROPONIN I

## 2018-03-17 NOTE — Progress Notes (Signed)
Physical Therapy Treatment Patient Details Name: [name] [name] MRN: [id number] DOB: [birth year] Today's Date: 03/17/2018    History of Present Illness Pt is a 84 y/o female admitted secondary to syncope and dizziness. CT negative for acute abnormality. PMH includes CAD, MI, CVA with L sided weakness, DVT, a fib, HTN, R TKA, and s/p pacemaker placement.     PT Comments    Patient is making progress toward PT goals. Pt is SOB with activity and required seated rest break during session. SpO2 and HR WNL. Pt overall required supervision/min guard assist for OOB mobility. Current plan remains appropriate.    Follow Up Recommendations  Home health PT;Supervision/Assistance - 24 hour     Equipment Recommendations  None recommended by PT    Recommendations for Other Services OT consult     Precautions / Restrictions Precautions Precautions: Fall Restrictions Weight Bearing Restrictions: No    Mobility  Bed Mobility Overal bed mobility: Modified Independent Bed Mobility: Supine to Sit           General bed mobility comments: increased time and effort  Transfers Overall transfer level: Needs assistance Equipment used: Rolling walker (2 wheeled) Transfers: Sit to/from Stand Sit to Stand: Min guard         General transfer comment: min guard for safety; cues for safe hand placement; pt needs extra time to power up but no physical assist needed  Ambulation/Gait Ambulation/Gait assistance: Min guard Ambulation Distance (Feet): (182ft X2) Assistive device: Rolling walker (2 wheeled) Gait Pattern/deviations: Step-through pattern;Decreased stance time - left;Decreased step length - right;Decreased stride length Gait velocity: Decreased    General Gait Details: min guard for safety; pt with SOB with activity and needed seated rest break; SpO2 WNL   Stairs Stairs: Yes Stairs assistance: Min guard Stair Management: One rail Left;Alternating pattern;Forwards Number of Stairs:  (3 steps then 2 steps) General stair comments: min guard for safety   Wheelchair Mobility    Modified Rankin (Stroke Patients Only)       Balance Overall balance assessment: Needs assistance Sitting-balance support: No upper extremity supported;Feet supported Sitting balance-Leahy Scale: Fair     Standing balance support: During functional activity;Single extremity supported Standing balance-Leahy Scale: Poor                              Cognition Arousal/Alertness: Awake/alert Behavior During Therapy: WFL for tasks assessed/performed Overall Cognitive Status: Within Functional Limits for tasks assessed                                        Exercises      General Comments        Pertinent Vitals/Pain Pain Assessment: No/denies pain    Home Living                      Prior Function            PT Goals (current goals can now be found in the care plan section) Acute Rehab PT Goals Patient Stated Goal: to go home  PT Goal Formulation: With patient Time For Goal Achievement: 03/30/18 Potential to Achieve Goals: Good Progress towards PT goals: Progressing toward goals    Frequency    Min 3X/week      PT Plan Current plan remains appropriate    Co-evaluation  AM-PAC PT "6 Clicks" Daily Activity  Outcome Measure  Difficulty turning over in bed (including adjusting bedclothes, sheets and blankets)?: None Difficulty moving from lying on back to sitting on the side of the bed? : A Little Difficulty sitting down on and standing up from a chair with arms (e.g., wheelchair, bedside commode, etc,.)?: Unable Help needed moving to and from a bed to chair (including a wheelchair)?: A Little Help needed walking in hospital room?: A Little Help needed climbing 3-5 steps with a railing? : A Little 6 Click Score: 17    End of Session Equipment Utilized During Treatment: Gait belt Activity Tolerance:  Patient tolerated treatment well Patient left: with call bell/phone within reach;in chair Nurse Communication: Mobility status PT Visit Diagnosis: Unsteadiness on feet (R26.81);Muscle weakness (generalized) (M62.81)     Time: 1607-3710 PT Time Calculation (min) (ACUTE ONLY): 26 min  Charges:  $Gait Training: 8-22 mins $Therapeutic Activity: 8-22 mins                    G Codes:       [name], PTA Pager: [phone]     [name] 03/17/2018, 9:33 AM

## 2018-03-17 NOTE — Progress Notes (Addendum)
Progress Note  Patient Name: [name] [name] Date of Encounter: 03/17/2018  Primary Cardiologist: Norman Herrlich, MD   Subjective   No chest pain , no SOB, at times is still dizzy, especially when she first stands.  She is frustrated with being weak.  Inpatient Medications    Scheduled Meds: . furosemide  20 mg Oral Once per day on Mon Thu  . pravastatin  20 mg Oral q1800  . rivaroxaban  15 mg Oral Q supper  . verapamil  240 mg Oral QHS   Continuous Infusions: . sodium chloride 100 mL/hr at 03/16/18 0639   PRN Meds: alum & mag hydroxide-simeth, iopamidol, iopamidol, ondansetron **OR** ondansetron (ZOFRAN) IV, senna   Vital Signs    Vitals:   03/17/18 1212 03/17/18 1217 03/17/18 1224 03/17/18 1529  BP: 122/88 137/65 (!) 155/60 139/73  Pulse: 74 (!) 59 60 62  Resp:    20  Temp:    97.7 F (36.5 C)  TempSrc:    Oral  SpO2:    97%  Weight:        Intake/Output Summary (Last 24 hours) at 03/17/2018 1556 Last data filed at 03/17/2018 1227 Gross per 24 hour  Intake 2388.33 ml  Output 2000 ml  Net 388.33 ml   Filed Weights   03/16/18 0500 03/17/18 0500  Weight: 151 lb 14.4 oz (68.9 kg) 151 lb 10.8 oz (68.8 kg)    Telemetry    AV pacing with occ PVCs - Personally Reviewed  ECG    No new - Personally Reviewed  Physical Exam   GEN: No acute distress.   Neck: No JVD Cardiac: RRR, no murmurs, rubs, or gallops.  Respiratory: Clear to auscultation bilaterally. GI: Soft, nontender, non-distended  MS: No edema; No deformity. Neuro:  Nonfocal  Psych: Normal affect   Labs    Chemistry Recent Labs  Lab 03/15/18 0805 03/16/18 0611 03/17/18 0412  NA 140 141 140  K 4.0 3.9 3.7  CL 108 111 109  CO2 23 23 20*  GLUCOSE 92 93 94  BUN 20 14 11   CREATININE 1.26* 0.94 0.92  CALCIUM 9.0 8.6* 8.7*  PROT 6.0*  --   --   ALBUMIN 3.5  --   --   AST 37  --   --   ALT 40  --   --   ALKPHOS 52  --   --   BILITOT 0.6  --   --   GFRNONAA 36* 51* 53*  GFRAA 42* 60* >60    ANIONGAP 9 7 11      Hematology Recent Labs  Lab 03/15/18 0805 03/16/18 0611 03/17/18 0412  WBC 5.2 4.2 5.8  RBC 4.30 4.15 4.21  HGB 12.8 12.4 12.5  HCT 40.0 38.2 38.7  MCV 93.0 92.0 91.9  MCH 29.8 29.9 29.7  MCHC 32.0 32.5 32.3  RDW 14.0 13.9 14.0  PLT 164 163 179    Cardiac Enzymes Recent Labs  Lab 03/16/18 1652 03/16/18 2244 03/17/18 0412  TROPONINI <0.03 <0.03 <0.03    Recent Labs  Lab 03/15/18 0827  TROPIPOC 0.00     BNPNo results for input(s): BNP, PROBNP in the last 168 hours.   DDimer No results for input(s): DDIMER in the last 168 hours.   Radiology    Ct Angio Head W Or Wo Contrast  Result Date: 03/16/2018 CLINICAL DATA:  84 y/o F; recent fall with trauma to the back of the head. Stroke evaluation. EXAM: CT ANGIOGRAPHY HEAD AND NECK  TECHNIQUE: Multidetector CT imaging of the head and neck was performed using the standard protocol during bolus administration of intravenous contrast. Multiplanar CT image reconstructions and MIPs were obtained to evaluate the vascular anatomy. Carotid stenosis measurements (when applicable) are obtained utilizing NASCET criteria, using the distal internal carotid diameter as the denominator. CONTRAST:  50mL ISOVUE-370 IOPAMIDOL (ISOVUE-370) INJECTION 76% COMPARISON:  03/15/2018 CT head. FINDINGS: CT HEAD FINDINGS Brain: No evidence of acute infarction, hemorrhage, hydrocephalus, extra-axial collection or mass lesion/mass effect. Stable chronic microvascular ischemic changes and parenchymal volume loss of the brain. Stable small chronic infarction in left occipital lobe. Stable bilateral low-attenuation chronic hygromas over the cerebral convexities. Vascular: As below. Skull: Normal. Negative for fracture or focal lesion. Sinuses: Right concha bullosa. Normal aeration of paranasal sinuses and mastoid air cells. Orbits: Bilateral intra-ocular lens replacement. Review of the MIP images confirms the above findings CTA NECK FINDINGS  Aortic arch: Standard branching. Imaged portion shows no evidence of aneurysm or dissection. No significant stenosis of the major arch vessel origins. Right carotid system: No evidence of dissection, stenosis (50% or greater) or occlusion. Left carotid system: No evidence of dissection, stenosis (50% or greater) or occlusion. Vertebral arteries: Left dominant. No evidence of dissection, stenosis (50% or greater) or occlusion. Skeleton: Negative. Other neck: Thyroid nodules measuring up to 8 mm in the lower lower lobe. Upper chest: Clustered nodules in bronchovascular distribution at the lung peripheries. Review of the MIP images confirms the above findings CTA HEAD FINDINGS Anterior circulation: No significant stenosis, proximal occlusion, aneurysm, or vascular malformation. Posterior circulation: No significant stenosis, proximal occlusion, aneurysm, or vascular malformation. Venous sinuses: As permitted by contrast timing, patent. Anatomic variants: Patent small anterior communicating artery, small left posterior communicating artery, and right fetal PCA. Delayed phase: No abnormal intracranial enhancement. Review of the MIP images confirms the above findings IMPRESSION: 1. No acute intracranial abnormality on noncontrast CT of head. No abnormal enhancement after intravenous contrast administration. 2. Patent carotid and vertebral arteries. No dissection, aneurysm, or hemodynamically significant stenosis utilizing NASCET criteria. 3. Patent anterior and posterior intracranial circulation. No large vessel occlusion, aneurysm, or significant stenosis. 4. Stable chronic microvascular ischemic changes and parenchymal volume loss of the brain. Stable bilateral convexity hygroma. Electronically Signed   By: Mitzi Hansen M.D.   On: 03/16/2018 20:00   Ct Angio Neck W Or Wo Contrast  Result Date: 03/16/2018 CLINICAL DATA:  84 y/o F; recent fall with trauma to the back of the head. Stroke evaluation. EXAM: CT  ANGIOGRAPHY HEAD AND NECK TECHNIQUE: Multidetector CT imaging of the head and neck was performed using the standard protocol during bolus administration of intravenous contrast. Multiplanar CT image reconstructions and MIPs were obtained to evaluate the vascular anatomy. Carotid stenosis measurements (when applicable) are obtained utilizing NASCET criteria, using the distal internal carotid diameter as the denominator. CONTRAST:  50mL ISOVUE-370 IOPAMIDOL (ISOVUE-370) INJECTION 76% COMPARISON:  03/15/2018 CT head. FINDINGS: CT HEAD FINDINGS Brain: No evidence of acute infarction, hemorrhage, hydrocephalus, extra-axial collection or mass lesion/mass effect. Stable chronic microvascular ischemic changes and parenchymal volume loss of the brain. Stable small chronic infarction in left occipital lobe. Stable bilateral low-attenuation chronic hygromas over the cerebral convexities. Vascular: As below. Skull: Normal. Negative for fracture or focal lesion. Sinuses: Right concha bullosa. Normal aeration of paranasal sinuses and mastoid air cells. Orbits: Bilateral intra-ocular lens replacement. Review of the MIP images confirms the above findings CTA NECK FINDINGS Aortic arch: Standard branching. Imaged portion shows no evidence of aneurysm  or dissection. No significant stenosis of the major arch vessel origins. Right carotid system: No evidence of dissection, stenosis (50% or greater) or occlusion. Left carotid system: No evidence of dissection, stenosis (50% or greater) or occlusion. Vertebral arteries: Left dominant. No evidence of dissection, stenosis (50% or greater) or occlusion. Skeleton: Negative. Other neck: Thyroid nodules measuring up to 8 mm in the lower lower lobe. Upper chest: Clustered nodules in bronchovascular distribution at the lung peripheries. Review of the MIP images confirms the above findings CTA HEAD FINDINGS Anterior circulation: No significant stenosis, proximal occlusion, aneurysm, or vascular  malformation. Posterior circulation: No significant stenosis, proximal occlusion, aneurysm, or vascular malformation. Venous sinuses: As permitted by contrast timing, patent. Anatomic variants: Patent small anterior communicating artery, small left posterior communicating artery, and right fetal PCA. Delayed phase: No abnormal intracranial enhancement. Review of the MIP images confirms the above findings IMPRESSION: 1. No acute intracranial abnormality on noncontrast CT of head. No abnormal enhancement after intravenous contrast administration. 2. Patent carotid and vertebral arteries. No dissection, aneurysm, or hemodynamically significant stenosis utilizing NASCET criteria. 3. Patent anterior and posterior intracranial circulation. No large vessel occlusion, aneurysm, or significant stenosis. 4. Stable chronic microvascular ischemic changes and parenchymal volume loss of the brain. Stable bilateral convexity hygroma. Electronically Signed   By: Mitzi Hansen M.D.   On: 03/16/2018 20:00   Dg Foot 2 Views Left  Result Date: 03/15/2018 CLINICAL DATA:  Generalized left foot pain after fall in the bathroom this morning. No history of previous injuries. EXAM: LEFT FOOT - 2 VIEW COMPARISON:  Left foot series dated August 21, 2011 FINDINGS: The bones are subjectively osteopenic. The phalanges are intact as are the metatarsals. The interphalangeal joints and the MTP joints are reasonably well-maintained. The tarsal bones appear intact. The intertarsal and tarsometatarsal joints are grossly normal. The soft tissues are unremarkable. Where visualized the distal tibia and fibula appear normal. IMPRESSION: There is no acute bony abnormality of the right foot. There is subjective diffuse osteopenia which appears to have progressed since the previous study. Electronically Signed   By: David  [place] M.D.   On: 03/15/2018 16:52    Cardiac Studies   Echo 11/26/17 Study Conclusions  - Left ventricle: The  cavity size was normal. Systolic function was   normal. The estimated ejection fraction was in the range of 50%   to 55%. Wall motion was normal; there were no regional wall   motion abnormalities. There was an increased relative   contribution of atrial contraction to ventricular filling.   Doppler parameters are consistent with abnormal left ventricular   relaxation (grade 1 diastolic dysfunction). - Aortic valve: Trileaflet; normal thickness, mildly calcified   leaflets. - Mitral valve: There was mild regurgitation. - Impressions: The findings indicate significant septal-lateral   left ventricular wall dyssynchrony due to RV pacing  Impressions:  - Low normal LVF with EF 50-55% with septal lateral wall   dyssynchrony due to RV pacing, mild TR and MR, grade 1 DD> The   findings indicate significant septal-lateral left ventricular   wall dyssynchrony due to RV pacing  Patient Profile     84 y.o. female with a hx of paroxysmal atrial fibrillation, DVT 11/2017 on chronic anticoagulation with low dose xarelto, complete heart block s/p PPM in place (05/2015), hypertensive heart disease without heart failure, orthostatic hypotension, CAD (qeustionable MI in 1975, nonobstuctive cath in 2011), hx of CVA x 2 and chronic diastolic heart failure now admitted with syncope.  Assessment &  Plan    Syncope no arrhthymias on interrogation of PPM --orthostatic hypotension, not present on admit but did receive IV fluids.  Now is orthostatic.  She had been placed on lasix and verapamil.  Hold both for now, maybe low dose verapamil to prevent atrial ectopic burden.  --TED stockings may help and perhaps abdominal binder --neg for CVA  Neurology has seen they recommend HHPT and OT   episodic atrial tach  Noted on PPM interrogation in 02/2018 may need lower dose verapamil.   PAF maintaining SR with AV pacing --on xarelto, may need to discuss stopping with risk of falls.but alos on xarelto for peroneal  DVT in 11/2017   HTN  Elevated - may need to have higher BP  --BP today 155/60 to 139/73   Chronic diastolic HF  --lasix on hold  --she is pos 1951 since admit  --euvolemic now  Peroneal DVT on xarelto       For questions or updates, please contact [facility] Please consult [email or web address] for contact info under Cardiology/STEMI.      Signed, Nada Boozer, NP  03/17/2018, 3:56 PM    Attending Note:   The patient was seen and examined.  Agree with assessment and plan as noted above.  Changes made to the above note as needed.  Patient seen and independently examined with Nada Boozer, NP .   We discussed all aspects of the encounter. I agree with the assessment and plan as stated above.  1.  Orthostatic hypotension: The patient's symptoms are entirely consistent with orthostatic hypotension.  She has a pacemaker and has not had any bradycardic episodes.  Pacemaker interrogation does not reveal any episodes of tachycardia.  I suspect that she is becoming orthostatic because of her age of 59, Lasix, and verapamil therapy.  Lasix  and verapamil have been held now for 2 days and she is still orthostatic.  I suspect that this is an age-related issue.  I would recommend that we continue to hold the Lasix and verapamil and see how she does.  We will watch for any further signs of atrial tachycardia.  If she does have some atrial tachycardia, we can try a lower dose of verapamil.  I would allow her blood pressure to be elevated.  An elevated blood pressure would certainly be better than a normal blood pressure which then drops when she stands up resulting in syncope and possible injury.     I have spent a total of 40 minutes with patient reviewing hospital  notes , telemetry, EKGs, labs and examining patient as well as establishing an assessment and plan that was discussed with the patient. > 50% of time was spent in direct patient care.    Vesta Mixer, Montez Hageman., MD,  [facility] 03/17/2018, 6:22 PM [address],  [address] Office [phone] Pager [phone]

## 2018-03-17 NOTE — Progress Notes (Signed)
PROGRESS NOTE    [name] [name]  JYN:[id number] DOB: 1923-11-28 DOA: 03/15/2018 PCP: Olive Bass, MD    Brief Narrative: 84 year old female with right lower extremity DVT on Xarelto and history of pacemaker due to complete AV heart block multiple other medical problems presents after an episode of syncope.  Her blood pressure medications have recently been adjusted.  Her doses were increased due to her heart rate being too fast.  He now presents relatively bradycardic concern for medication related syncope.  We will admit her and work her up for syncope and consult neurology.   Assessment & Plan:   Active Problems:   High cholesterol   Coronary artery disease prior MI   Hypertensive heart disease   History of pulmonary embolism   Presence of cardiac pacemaker   Syncope and collapse   Cerebrovascular accident (CVA) due to thrombosis of right middle cerebral artery (HCC)   Chronic kidney disease, stage III (moderate) (HCC)   History of DVT (deep vein thrombosis)   Chronic anticoagulation   Chronic diastolic heart failure (HCC)   1-syncope; history of VT.  Recently saw her cardiologist , started on verapamil.  Unable to do MRI. Neurology recommended CT angio head and neck Cardiology consulted. Defer to cardiology adjustment of medications.  Still orthostatic today. Received lasix yesterday.  Await cardiology rec.   HTN; and now with orthostatic hypotension.  Dint received verapamil yesterday. Received lasix.    History of DVT RLE in 2014, PE  PMP . [facility] artery CVA, Afib CHADSVASC 6  on Xarelto Xarelto per Pharmacy dose   Diastolic CHF: Per cardiology   Chest pain; on and off for weeks. Check troponin. Already on anticoagulation.   COPD; stable.  CKD stage III; cr 0.9 Weakness; TSH normal, B 12 normal.  Needs PT>    DVT prophylaxis: on xarelto Code Status: DNR Family Communication: care discussed with patient.  Disposition Plan: home when stable.     Consultants:   Neurology   Cardiology    Procedures:     Antimicrobials  none   Subjective: Feeling ok, had some dizziness today while ambulating with PT>  Feels weak.   Objective: Vitals:   03/17/18 1212 03/17/18 1217 03/17/18 1224 03/17/18 1529  BP: 122/88 137/65 (!) 155/60 139/73  Pulse: 74 (!) 59 60 62  Resp:    20  Temp:    97.7 F (36.5 C)  TempSrc:    Oral  SpO2:    97%  Weight:        Intake/Output Summary (Last 24 hours) at 03/17/2018 1747 Last data filed at 03/17/2018 1227 Gross per 24 hour  Intake 1648.33 ml  Output 2000 ml  Net -351.67 ml   Filed Weights   03/16/18 0500 03/17/18 0500  Weight: 68.9 kg (151 lb 14.4 oz) 68.8 kg (151 lb 10.8 oz)    Examination:  General exam: NAD Respiratory system: CTA Cardiovascular system:  S 1, S 2 RRR Gastrointestinal system: BS present, soft, nt Central nervous system: non focal.  Extremities: symmetric power.  Skin: no rash      Data Reviewed: I have personally reviewed following labs and imaging studies  CBC: Recent Labs  Lab 03/15/18 0805 03/16/18 0611 03/17/18 0412  WBC 5.2 4.2 5.8  NEUTROABS 3.4  --   --   HGB 12.8 12.4 12.5  HCT 40.0 38.2 38.7  MCV 93.0 92.0 91.9  PLT 164 163 179   Basic Metabolic Panel: Recent Labs  Lab 03/15/18 0805 03/16/18  1610 03/17/18 0412  NA 140 141 140  K 4.0 3.9 3.7  CL 108 111 109  CO2 23 23 20*  GLUCOSE 92 93 94  BUN 20 14 11   CREATININE 1.26* 0.94 0.92  CALCIUM 9.0 8.6* 8.7*   GFR: Estimated Creatinine Clearance: 37.3 mL/min (by C-G formula based on SCr of 0.92 mg/dL). Liver Function Tests: Recent Labs  Lab 03/15/18 0805  AST 37  ALT 40  ALKPHOS 52  BILITOT 0.6  PROT 6.0*  ALBUMIN 3.5   No results for input(s): LIPASE, AMYLASE in the last 168 hours. No results for input(s): AMMONIA in the last 168 hours. Coagulation Profile: Recent Labs  Lab 03/15/18 0805  INR 1.21   Cardiac Enzymes: Recent Labs  Lab 03/16/18 1652  03/16/18 2244 03/17/18 0412  TROPONINI <0.03 <0.03 <0.03   BNP (last 3 results) No results for input(s): PROBNP in the last 8760 hours. HbA1C: Recent Labs    03/16/18 0640  HGBA1C 5.5   CBG: Recent Labs  Lab 03/16/18 0639 03/17/18 0649  GLUCAP 90 91   Lipid Profile: Recent Labs    03/16/18 0611  CHOL 166  HDL 49  LDLCALC 100*  TRIG 86  CHOLHDL 3.4   Thyroid Function Tests: Recent Labs    03/15/18 1428  TSH 1.008   Anemia Panel: Recent Labs    03/16/18 0640  VITAMINB12 1,217*   Sepsis Labs: No results for input(s): PROCALCITON, LATICACIDVEN in the last 168 hours.  Recent Results (from the past 240 hour(s))  Urine culture     Status: Abnormal   Collection Time: 03/15/18  7:29 AM  Result Value Ref Range Status   Specimen Description URINE, CLEAN CATCH  Final   Special Requests NONE  Final   Culture (A)  Final    <10,000 COLONIES/mL INSIGNIFICANT GROWTH Performed at [facility], [address], [place], [place] [address]    Report Status 03/16/2018 FINAL  Final         Radiology Studies: Ct Angio Head W Or Wo Contrast  Result Date: 03/16/2018 CLINICAL DATA:  84 y/o F; recent fall with trauma to the back of the head. Stroke evaluation. EXAM: CT ANGIOGRAPHY HEAD AND NECK TECHNIQUE: Multidetector CT imaging of the head and neck was performed using the standard protocol during bolus administration of intravenous contrast. Multiplanar CT image reconstructions and MIPs were obtained to evaluate the vascular anatomy. Carotid stenosis measurements (when applicable) are obtained utilizing NASCET criteria, using the distal internal carotid diameter as the denominator. CONTRAST:  50mL ISOVUE-370 IOPAMIDOL (ISOVUE-370) INJECTION 76% COMPARISON:  03/15/2018 CT head. FINDINGS: CT HEAD FINDINGS Brain: No evidence of acute infarction, hemorrhage, hydrocephalus, extra-axial collection or mass lesion/mass effect. Stable chronic microvascular ischemic changes and  parenchymal volume loss of the brain. Stable small chronic infarction in left occipital lobe. Stable bilateral low-attenuation chronic hygromas over the cerebral convexities. Vascular: As below. Skull: Normal. Negative for fracture or focal lesion. Sinuses: Right concha bullosa. Normal aeration of paranasal sinuses and mastoid air cells. Orbits: Bilateral intra-ocular lens replacement. Review of the MIP images confirms the above findings CTA NECK FINDINGS Aortic arch: Standard branching. Imaged portion shows no evidence of aneurysm or dissection. No significant stenosis of the major arch vessel origins. Right carotid system: No evidence of dissection, stenosis (50% or greater) or occlusion. Left carotid system: No evidence of dissection, stenosis (50% or greater) or occlusion. Vertebral arteries: Left dominant. No evidence of dissection, stenosis (50% or greater) or occlusion. Skeleton: Negative. Other  neck: Thyroid nodules measuring up to 8 mm in the lower lower lobe. Upper chest: Clustered nodules in bronchovascular distribution at the lung peripheries. Review of the MIP images confirms the above findings CTA HEAD FINDINGS Anterior circulation: No significant stenosis, proximal occlusion, aneurysm, or vascular malformation. Posterior circulation: No significant stenosis, proximal occlusion, aneurysm, or vascular malformation. Venous sinuses: As permitted by contrast timing, patent. Anatomic variants: Patent small anterior communicating artery, small left posterior communicating artery, and right fetal PCA. Delayed phase: No abnormal intracranial enhancement. Review of the MIP images confirms the above findings IMPRESSION: 1. No acute intracranial abnormality on noncontrast CT of head. No abnormal enhancement after intravenous contrast administration. 2. Patent carotid and vertebral arteries. No dissection, aneurysm, or hemodynamically significant stenosis utilizing NASCET criteria. 3. Patent anterior and posterior  intracranial circulation. No large vessel occlusion, aneurysm, or significant stenosis. 4. Stable chronic microvascular ischemic changes and parenchymal volume loss of the brain. Stable bilateral convexity hygroma. Electronically Signed   By: Mitzi Hansen M.D.   On: 03/16/2018 20:00   Ct Angio Neck W Or Wo Contrast  Result Date: 03/16/2018 CLINICAL DATA:  84 y/o F; recent fall with trauma to the back of the head. Stroke evaluation. EXAM: CT ANGIOGRAPHY HEAD AND NECK TECHNIQUE: Multidetector CT imaging of the head and neck was performed using the standard protocol during bolus administration of intravenous contrast. Multiplanar CT image reconstructions and MIPs were obtained to evaluate the vascular anatomy. Carotid stenosis measurements (when applicable) are obtained utilizing NASCET criteria, using the distal internal carotid diameter as the denominator. CONTRAST:  50mL ISOVUE-370 IOPAMIDOL (ISOVUE-370) INJECTION 76% COMPARISON:  03/15/2018 CT head. FINDINGS: CT HEAD FINDINGS Brain: No evidence of acute infarction, hemorrhage, hydrocephalus, extra-axial collection or mass lesion/mass effect. Stable chronic microvascular ischemic changes and parenchymal volume loss of the brain. Stable small chronic infarction in left occipital lobe. Stable bilateral low-attenuation chronic hygromas over the cerebral convexities. Vascular: As below. Skull: Normal. Negative for fracture or focal lesion. Sinuses: Right concha bullosa. Normal aeration of paranasal sinuses and mastoid air cells. Orbits: Bilateral intra-ocular lens replacement. Review of the MIP images confirms the above findings CTA NECK FINDINGS Aortic arch: Standard branching. Imaged portion shows no evidence of aneurysm or dissection. No significant stenosis of the major arch vessel origins. Right carotid system: No evidence of dissection, stenosis (50% or greater) or occlusion. Left carotid system: No evidence of dissection, stenosis (50% or greater)  or occlusion. Vertebral arteries: Left dominant. No evidence of dissection, stenosis (50% or greater) or occlusion. Skeleton: Negative. Other neck: Thyroid nodules measuring up to 8 mm in the lower lower lobe. Upper chest: Clustered nodules in bronchovascular distribution at the lung peripheries. Review of the MIP images confirms the above findings CTA HEAD FINDINGS Anterior circulation: No significant stenosis, proximal occlusion, aneurysm, or vascular malformation. Posterior circulation: No significant stenosis, proximal occlusion, aneurysm, or vascular malformation. Venous sinuses: As permitted by contrast timing, patent. Anatomic variants: Patent small anterior communicating artery, small left posterior communicating artery, and right fetal PCA. Delayed phase: No abnormal intracranial enhancement. Review of the MIP images confirms the above findings IMPRESSION: 1. No acute intracranial abnormality on noncontrast CT of head. No abnormal enhancement after intravenous contrast administration. 2. Patent carotid and vertebral arteries. No dissection, aneurysm, or hemodynamically significant stenosis utilizing NASCET criteria. 3. Patent anterior and posterior intracranial circulation. No large vessel occlusion, aneurysm, or significant stenosis. 4. Stable chronic microvascular ischemic changes and parenchymal volume loss of the brain. Stable bilateral convexity hygroma. Electronically Signed  By: Mitzi Hansen M.D.   On: 03/16/2018 20:00        Scheduled Meds: . furosemide  20 mg Oral Once per day on Mon Thu  . pravastatin  20 mg Oral q1800  . rivaroxaban  15 mg Oral Q supper  . verapamil  240 mg Oral QHS   Continuous Infusions: . sodium chloride 100 mL/hr at 03/16/18 0639     LOS: 0 days    Time spent: 35 minutes.     Alba Cory, MD [facility] Pager [phone]  If 7PM-7AM, please contact night-coverage [email or web address] Password [facility] 03/17/2018, 5:47 PM

## 2018-03-17 NOTE — Progress Notes (Addendum)
STROKE TEAM PROGRESS NOTE   INTERVAL HISTORY  Patient up in chair at bedside.  No neurologic change since yesterday.  She is glad to hear her CT angio of head neck was essentially unremarkable.  Home health therapy has been recommended, which she was receiving not long prior to admission.  Vitals:   03/16/18 2013 03/17/18 0036 03/17/18 0425 03/17/18 0500  BP: (!) 174/66 (!) 156/62 (!) 139/55   Pulse: 63 (!) 59 60   Resp: 16 16 16    Temp: 98.9 F (37.2 C) 98.1 F (36.7 C) 98.4 F (36.9 C)   TempSrc: Oral Oral Oral   SpO2: 97% 95% 95%   Weight:    68.8 kg (151 lb 10.8 oz)   CBC:  CBC Latest Ref Rng & Units 03/17/2018 03/16/2018 03/15/2018  WBC 4.0 - 10.5 K/uL 5.8 4.2 5.2  Hemoglobin 12.0 - 15.0 g/dL 16.1 09.6 04.5  Hematocrit 36.0 - 46.0 % 38.7 38.2 40.0  Platelets 150 - 400 K/uL 179 163 164    Comprehensive Metabolic Panel:   CMP Latest Ref Rng & Units 03/17/2018 03/16/2018 03/15/2018  Glucose 65 - 99 mg/dL 94 93 92  BUN 6 - 20 mg/dL 11 14 20   Creatinine 0.44 - 1.00 mg/dL 4.09 8.11 9.14(N)  Sodium 135 - 145 mmol/L 140 141 140  Potassium 3.5 - 5.1 mmol/L 3.7 3.9 4.0  Chloride 101 - 111 mmol/L 109 111 108  CO2 22 - 32 mmol/L 20(L) 23 23  Calcium 8.9 - 10.3 mg/dL 8.2(N) 5.6(O) 9.0  Total Protein 6.5 - 8.1 g/dL - - 6.0(L)  Total Bilirubin 0.3 - 1.2 mg/dL - - 0.6  Alkaline Phos 38 - 126 U/L - - 52  AST 15 - 41 U/L - - 37  ALT 14 - 54 U/L - - 40   Lipid Panel:     Component Value Date/Time   CHOL 166 03/16/2018 0611   TRIG 86 03/16/2018 0611   HDL 49 03/16/2018 0611   CHOLHDL 3.4 03/16/2018 0611   VLDL 17 03/16/2018 0611   LDLCALC 100 (H) 03/16/2018 0611   HgbA1c:  Lab Results  Component Value Date   HGBA1C 5.5 03/16/2018   Urine Drug Screen: No results found for: LABOPIA, COCAINSCRNUR, LABBENZ, AMPHETMU, THCU, LABBARB  Alcohol Level No results found for: ETH  IMAGING Ct Angio Head W Or Wo Contrast  Result Date: 03/16/2018 CLINICAL DATA:  84 y/o F; recent fall with trauma to  the back of the head. Stroke evaluation. EXAM: CT ANGIOGRAPHY HEAD AND NECK TECHNIQUE: Multidetector CT imaging of the head and neck was performed using the standard protocol during bolus administration of intravenous contrast. Multiplanar CT image reconstructions and MIPs were obtained to evaluate the vascular anatomy. Carotid stenosis measurements (when applicable) are obtained utilizing NASCET criteria, using the distal internal carotid diameter as the denominator. CONTRAST:  50mL ISOVUE-370 IOPAMIDOL (ISOVUE-370) INJECTION 76% COMPARISON:  03/15/2018 CT head. FINDINGS: CT HEAD FINDINGS Brain: No evidence of acute infarction, hemorrhage, hydrocephalus, extra-axial collection or mass lesion/mass effect. Stable chronic microvascular ischemic changes and parenchymal volume loss of the brain. Stable small chronic infarction in left occipital lobe. Stable bilateral low-attenuation chronic hygromas over the cerebral convexities. Vascular: As below. Skull: Normal. Negative for fracture or focal lesion. Sinuses: Right concha bullosa. Normal aeration of paranasal sinuses and mastoid air cells. Orbits: Bilateral intra-ocular lens replacement. Review of the MIP images confirms the above findings CTA NECK FINDINGS Aortic arch: Standard branching. Imaged portion shows no evidence of aneurysm or  dissection. No significant stenosis of the major arch vessel origins. Right carotid system: No evidence of dissection, stenosis (50% or greater) or occlusion. Left carotid system: No evidence of dissection, stenosis (50% or greater) or occlusion. Vertebral arteries: Left dominant. No evidence of dissection, stenosis (50% or greater) or occlusion. Skeleton: Negative. Other neck: Thyroid nodules measuring up to 8 mm in the lower lower lobe. Upper chest: Clustered nodules in bronchovascular distribution at the lung peripheries. Review of the MIP images confirms the above findings CTA HEAD FINDINGS Anterior circulation: No significant  stenosis, proximal occlusion, aneurysm, or vascular malformation. Posterior circulation: No significant stenosis, proximal occlusion, aneurysm, or vascular malformation. Venous sinuses: As permitted by contrast timing, patent. Anatomic variants: Patent small anterior communicating artery, small left posterior communicating artery, and right fetal PCA. Delayed phase: No abnormal intracranial enhancement. Review of the MIP images confirms the above findings IMPRESSION: 1. No acute intracranial abnormality on noncontrast CT of head. No abnormal enhancement after intravenous contrast administration. 2. Patent carotid and vertebral arteries. No dissection, aneurysm, or hemodynamically significant stenosis utilizing NASCET criteria. 3. Patent anterior and posterior intracranial circulation. No large vessel occlusion, aneurysm, or significant stenosis. 4. Stable chronic microvascular ischemic changes and parenchymal volume loss of the brain. Stable bilateral convexity hygroma. Electronically Signed   By: Mitzi Hansen M.D.   On: 03/16/2018 20:00   Ct Head Wo Contrast  Result Date: 03/15/2018 CLINICAL DATA:  Fall with trauma to the back of the head and headache. EXAM: CT HEAD WITHOUT CONTRAST TECHNIQUE: Contiguous axial images were obtained from the base of the skull through the vertex without intravenous contrast. COMPARISON:  12/31/2017 FINDINGS: Brain: Chronic generalized brain atrophy. Chronic small-vessel ischemic changes of the hemispheric white matter. Old left occipital cortical and subcortical infarction. Bilateral low-density subdural hygromas, actually slightly smaller than on the previous study. No sign of any acute component. No hydrocephalus. Vascular: There is atherosclerotic calcification of the major vessels at the base of the brain. Skull: Negative.  No skull fracture. Sinuses/Orbits: Clear/normal Other: None IMPRESSION: No acute or traumatic finding. No skull fracture. No intracranial  hemorrhage. Atrophy of the brain. Chronic small-vessel ischemic changes of the white matter. Old left occipital infarction. Chronic bilateral low-density subdural hygromas, slightly smaller than in February. No acute component. Electronically Signed   By: Paulina Fusi M.D.   On: 03/15/2018 08:58   Ct Angio Neck W Or Wo Contrast  Result Date: 03/16/2018 CLINICAL DATA:  84 y/o F; recent fall with trauma to the back of the head. Stroke evaluation. EXAM: CT ANGIOGRAPHY HEAD AND NECK TECHNIQUE: Multidetector CT imaging of the head and neck was performed using the standard protocol during bolus administration of intravenous contrast. Multiplanar CT image reconstructions and MIPs were obtained to evaluate the vascular anatomy. Carotid stenosis measurements (when applicable) are obtained utilizing NASCET criteria, using the distal internal carotid diameter as the denominator. CONTRAST:  50mL ISOVUE-370 IOPAMIDOL (ISOVUE-370) INJECTION 76% COMPARISON:  03/15/2018 CT head. FINDINGS: CT HEAD FINDINGS Brain: No evidence of acute infarction, hemorrhage, hydrocephalus, extra-axial collection or mass lesion/mass effect. Stable chronic microvascular ischemic changes and parenchymal volume loss of the brain. Stable small chronic infarction in left occipital lobe. Stable bilateral low-attenuation chronic hygromas over the cerebral convexities. Vascular: As below. Skull: Normal. Negative for fracture or focal lesion. Sinuses: Right concha bullosa. Normal aeration of paranasal sinuses and mastoid air cells. Orbits: Bilateral intra-ocular lens replacement. Review of the MIP images confirms the above findings CTA NECK FINDINGS Aortic arch: Standard branching. Imaged  portion shows no evidence of aneurysm or dissection. No significant stenosis of the major arch vessel origins. Right carotid system: No evidence of dissection, stenosis (50% or greater) or occlusion. Left carotid system: No evidence of dissection, stenosis (50% or greater)  or occlusion. Vertebral arteries: Left dominant. No evidence of dissection, stenosis (50% or greater) or occlusion. Skeleton: Negative. Other neck: Thyroid nodules measuring up to 8 mm in the lower lower lobe. Upper chest: Clustered nodules in bronchovascular distribution at the lung peripheries. Review of the MIP images confirms the above findings CTA HEAD FINDINGS Anterior circulation: No significant stenosis, proximal occlusion, aneurysm, or vascular malformation. Posterior circulation: No significant stenosis, proximal occlusion, aneurysm, or vascular malformation. Venous sinuses: As permitted by contrast timing, patent. Anatomic variants: Patent small anterior communicating artery, small left posterior communicating artery, and right fetal PCA. Delayed phase: No abnormal intracranial enhancement. Review of the MIP images confirms the above findings IMPRESSION: 1. No acute intracranial abnormality on noncontrast CT of head. No abnormal enhancement after intravenous contrast administration. 2. Patent carotid and vertebral arteries. No dissection, aneurysm, or hemodynamically significant stenosis utilizing NASCET criteria. 3. Patent anterior and posterior intracranial circulation. No large vessel occlusion, aneurysm, or significant stenosis. 4. Stable chronic microvascular ischemic changes and parenchymal volume loss of the brain. Stable bilateral convexity hygroma. Electronically Signed   By: Mitzi Hansen M.D.   On: 03/16/2018 20:00   Dg Foot 2 Views Left  Result Date: 03/15/2018 CLINICAL DATA:  Generalized left foot pain after fall in the bathroom this morning. No history of previous injuries. EXAM: LEFT FOOT - 2 VIEW COMPARISON:  Left foot series dated August 21, 2011 FINDINGS: The bones are subjectively osteopenic. The phalanges are intact as are the metatarsals. The interphalangeal joints and the MTP joints are reasonably well-maintained. The tarsal bones appear intact. The intertarsal and  tarsometatarsal joints are grossly normal. The soft tissues are unremarkable. Where visualized the distal tibia and fibula appear normal. IMPRESSION: There is no acute bony abnormality of the right foot. There is subjective diffuse osteopenia which appears to have progressed since the previous study. Electronically Signed   By: David  [place] M.D.   On: 03/15/2018 16:52    PHYSICAL EXAM HEENT-  Normocephalic, no lesions, without obvious abnormality.  Normal external eye and conjunctiva.   Cardiovascular- S1-S2 audible, pulses palpable throughout   Lungs-no rhonchi or wheezing noted, no excessive working breathing.  Saturations within normal limits Extremities- Warm, dry and intact Musculoskeletal-no joint tenderness,  or swelling, toes with some deformities noted Skin-warm and dry  Neurological Examination Mental Status: Alert, oriented, thought content appropriate.  Speech fluent without evidence of aphasia.  Able to follow 3 step commands without difficulty. Cranial Nerves: II:  Visual fields grossly normal,  III,IV, VI: ptosis not present, extra-ocular motions intact bilaterally; pupils equal, round, reactive to light V,VII: smile symmetric, facial light touch sensation normal bilaterally VIII: hearing intact to voice IX,X: No hypophonia XI: Symmetric XII: midline tongue extension Motor: Right :  Upper extremity   5/5                          Left:     Upper extremity   4/5               Lower extremity   5/5  Lower extremity   4/5 Normal tone throughout; no atrophy noted Sensory:  light touch intact throughout, bilaterally Plantars: Right: downgoing                           Left: downgoing Cerebellar: normal finger-to-nose; bilateral rapid alternating movements the same; normal heel-to-shin test Gait: deferred    ASSESSMENT/PLAN Ms. AVALIN DENZLER is a 84 y.o. female with history of hypertension, hyperlipidemia, A. fib, bradycardia with  pacemaker, DVT on Xarelto, COPD, depression, anxiety presenting with dizziness.   Syncope  CT head No acute stroke. Small vessel disease. Atrophy. Old L occipital infarct. ASPECTS 10.     CTA head & neck small vessel disease.  Atrophy.  Bilateral convexity hygromas. No stroke.  MRI  / MRA  Has pacer  2D Echo  Jan 2019 EF 50-55%. No source of embolus.  RV pacing  LDL 100  HgbA1c 5.5  xarelto for VTE prophylaxis  Xarelto (rivaroxaban) daily prior to admission, resumed Xarelto (rivaroxaban) daily.  Continue at discharge.  Orthostatic BP stable  therapy recommendations:  HHPT, OT  Disposition:  return home  Nothing further to add. Stroke center will sign off. No indication for neuro followup. If needed, may contact [facility].  Atrial Fibrillation  Home anticoagulation:  Xarelto (rivaroxaban) daily   Sent to EP following R MCA stroke in March 2017 to assess for atrial fibrillation - started on apixaban but stopped taking by December on her own accord. Changed to Rivaroxaban, then stopped (reason unclear) notes refer to brief atrial arrhythmia w/ high CHADSVASC score. Followed by Dr. [name]  Now on Xarelto for Peroneal DVT in Jan  Takes xarelto every morning with a substantial breakfast    Hypertension  Stable . BP goal normotensive  Hyperlipidemia  Home meds: None.  She has been on statins in the far past but changed to flaxseed with good results  LDL 100, goal < 70  Given progressive weakness, do not recommend statin at this time   Other Stroke Risk Factors  Advanced age  UDS / ETOH level not performed   Hx MI  Hx stroke/TIA  01/2016 (->[facility]) R MCA infarct with resultant left hemiparesis. Required IP rehab.   2013  left occipital hemorrhage due to amyloid angiopathy versus hypertension   Other Active Problems  Peroneal DVT started on xarelto. hx of DVT in 2013  Vitamin B12 elevated at 1217  Complete heart block has  pacemaker  Irritable bowel syndrome  Diverticulosis    Hospital day # 0  Annie Main, MSN, APRN, ANVP-BC, AGPCNP-BC Advanced Practice Stroke Nurse [facility] See Amion for Schedule & Pager information 03/17/2018 8:26 AM    ATTENDING NOTE: I reviewed above note and agree with the assessment and plan. I have made any additions or clarifications directly to the above note. Pt was seen and examined.   Patient sitting in chair comfortably, no family at bedside.  Patient currently at her neuro baseline.  Orthostatic vitals lying 155/60-60, sitting 160/81-59, standing 137/65-59.  CT repeat showed no evidence of acute abnormality, stable bilateral chronic hygroma.  CT head and neck unremarkable.   Patient is educated on adequate p.o. Intake, avoid dehydration, cautious with sitting in the standing up.  Patient is eager to go home.  PT/OT recommend home health PT/OT. Continue Xarelto for stroke prevention and also for DVT prevention. Continue low-dose pravastatin for stroke prevention.  Neurology will sign off. Please call with questions.  No neuro follow-up needed at this time.  Thanks for the consult.  Marvel Plan, MD PhD Stroke Neurology 03/17/2018 3:58 PM   To contact Stroke Continuity provider, please refer to [email or web address]. After hours, contact General Neurology

## 2018-03-17 NOTE — Evaluation (Signed)
Occupational Therapy Evaluation Patient Details Name: [name] [name] MRN: [id number] DOB: 1924-08-13 Today's Date: 03/17/2018    History of Present Illness Pt is a 84 y/o female admitted secondary to syncope and dizziness. CT negative for acute abnormality. PMH includes CAD, MI, CVA with L sided weakness, DVT, a fib, HTN, R TKA, and s/p pacemaker placement.    Clinical Impression   Pt reports she was mod I with ADL PTA. Currently pt supervision-min guard assist for ADL and functional mobility. Pt fatigues quickly with limited activity; began energy conservation education. Pt planning to d/c home with 24/7 supervision from her husband and PCA at nighttime. Recommending HHOT for follow up to maximize independence and safety with ADL and functional mobility upon return home. Pt would benefit from continued skilled OT to address established goals.    Follow Up Recommendations  Home health OT;Supervision/Assistance - 24 hour    Equipment Recommendations  None recommended by OT    Recommendations for Other Services       Precautions / Restrictions Precautions Precautions: Fall Restrictions Weight Bearing Restrictions: No      Mobility Bed Mobility            General bed mobility comments: Pt OOB in chair upon arrival  Transfers Overall transfer level: Needs assistance Equipment used: Rolling walker (2 wheeled) Transfers: Sit to/from Stand Sit to Stand: Min guard         General transfer comment: Min guard for safety, increased time and effort    Balance Overall balance assessment: Needs assistance Sitting-balance support: Feet supported;No upper extremity supported Sitting balance-Leahy Scale: Good     Standing balance support: During functional activity;No upper extremity supported Standing balance-Leahy Scale: Fair Standing balance comment: for static standing                           ADL either performed or assessed with clinical judgement   ADL  Overall ADL's : Needs assistance/impaired Eating/Feeding: Set up;Sitting   Grooming: Min guard;Standing;Wash/dry hands   Upper Body Bathing: Set up;Supervision/ safety;Sitting   Lower Body Bathing: Min guard;Sit to/from stand   Upper Body Dressing : Set up;Supervision/safety;Sitting   Lower Body Dressing: Min guard;Sit to/from stand   Toilet Transfer: Min guard;Ambulation;RW Toilet Transfer Details (indicate cue type and reason): Simulated by sit to stand from chair with functional mobility         Functional mobility during ADLs: Min guard;Rolling walker General ADL Comments: Pt fatigues quickly with activity; began education on energy conservation strategies and building endurance. Pt agreeable to [facility] services     Vision   Vision Assessment?: No apparent visual deficits     Perception     Praxis      Pertinent Vitals/Pain Pain Assessment: No/denies pain     Hand Dominance Right   Extremity/Trunk Assessment Upper Extremity Assessment Upper Extremity Assessment: Generalized weakness   Lower Extremity Assessment Lower Extremity Assessment: Defer to PT evaluation   Cervical / Trunk Assessment Cervical / Trunk Assessment: Kyphotic   Communication Communication Communication: No difficulties   Cognition Arousal/Alertness: Awake/alert Behavior During Therapy: WFL for tasks assessed/performed Overall Cognitive Status: Within Functional Limits for tasks assessed                                     General Comments       Exercises     Shoulder  Instructions      Home Living Family/patient expects to be discharged to:: Private residence Living Arrangements: Spouse/significant other Available Help at Discharge: Family;Available 24 hours/day;Personal care attendant Type of Home: House Home Access: Stairs to enter [employer] of Steps: 2 Entrance Stairs-Rails: Right Home Layout: Two level;Able to live on main level with  bedroom/bathroom     Bathroom Shower/Tub: Walk-in shower   Bathroom Toilet: Handicapped height     Home Equipment: Environmental consultant - 2 wheels;Walker - 4 wheels;Bedside commode;Cane - single point;Shower seat;Grab bars - tub/shower   Additional Comments: Pt has PCA who spends the night to supervise husband who has PD. Pt also reports she has someone who comes every friday to help with "heavy housework"      Prior Functioning/Environment Level of Independence: Independent with assistive device(s)        Comments: Used cane for ambulation inside and rollator for ambulation outside. Independent with BADL and most IADL; has someone to help with heavy household chores        OT Problem List: Decreased strength;Decreased activity tolerance;Impaired balance (sitting and/or standing);Decreased knowledge of use of DME or AE      OT Treatment/Interventions: Self-care/ADL training;Therapeutic exercise;Energy conservation;DME and/or AE instruction;Therapeutic activities;Patient/family education;Balance training    OT Goals(Current goals can be found in the care plan section) Acute Rehab OT Goals Patient Stated Goal: to go home  OT Goal Formulation: With patient Time For Goal Achievement: 03/31/18 Potential to Achieve Goals: Good ADL Goals Pt Will Transfer to Toilet: with modified independence;ambulating;regular height toilet Pt Will Perform Toileting - Clothing Manipulation and hygiene: with modified independence;sit to/from stand Pt Will Perform Tub/Shower Transfer: Shower transfer;with modified independence;ambulating;shower seat;rolling walker Additional ADL Goal #1: Pt will independently verbally recall 3 energy conservation strategies and utilize during ADL. Additional ADL Goal #2: Pt will gather ADL items and perform ADL with mod I.  OT Frequency: Min 2X/week   Barriers to D/C:            Co-evaluation              AM-PAC PT "6 Clicks" Daily Activity     Outcome Measure Help  from another person eating meals?: None Help from another person taking care of personal grooming?: A Little Help from another person toileting, which includes using toliet, bedpan, or urinal?: A Little Help from another person bathing (including washing, rinsing, drying)?: A Little Help from another person to put on and taking off regular upper body clothing?: A Little Help from another person to put on and taking off regular lower body clothing?: A Little 6 Click Score: 19   End of Session Equipment Utilized During Treatment: Rolling walker  Activity Tolerance: Patient tolerated treatment well Patient left: in chair;with call bell/phone within reach;with chair alarm set  OT Visit Diagnosis: Unsteadiness on feet (R26.81);Muscle weakness (generalized) (M62.81)                Time: 6712-4580 OT Time Calculation (min): 11 min Charges:  OT General Charges $OT Visit: 1 Visit OT Evaluation $OT Eval Moderate Complexity: 1 Mod G-Codes:     Alaja Goldinger A. Brett Albino, M.S., OTR/L Pager: [phone]  Gaye Alken 03/17/2018, 11:17 AM

## 2018-03-18 ENCOUNTER — Observation Stay (HOSPITAL_COMMUNITY): Payer: Medicare HMO

## 2018-03-18 DIAGNOSIS — Z7901 Long term (current) use of anticoagulants: Secondary | ICD-10-CM | POA: Diagnosis not present

## 2018-03-18 DIAGNOSIS — R55 Syncope and collapse: Secondary | ICD-10-CM | POA: Diagnosis not present

## 2018-03-18 DIAGNOSIS — I951 Orthostatic hypotension: Secondary | ICD-10-CM | POA: Diagnosis not present

## 2018-03-18 LAB — GLUCOSE, CAPILLARY: Glucose-Capillary: 96 mg/dL (ref 65–99)

## 2018-03-18 MED ORDER — PRAVASTATIN SODIUM 20 MG PO TABS: 20.0000 mg | ORAL_TABLET | Freq: Every day | ORAL | 0 refills | Status: AC

## 2018-03-18 NOTE — Discharge Instructions (Signed)
Take 20 mg lasix as needed if your weight increase more than 3 pounds in 24 hours. Check your Blood pressure prior taking lasix, if low please contact your cardiologist

## 2018-03-18 NOTE — Progress Notes (Signed)
Progress Note  Patient Name: [name] [name] Date of Encounter: 03/18/2018  Primary Cardiologist: Norman Herrlich, MD   Subjective   [name] seems to be doing well.  She has not had any further episodes of orthostatic hypotension.  We have held her Lasix and verapamil.  Blood pressures have been running a little bit high but overall very acceptable.  Inpatient Medications    Scheduled Meds: . pravastatin  20 mg Oral q1800  . rivaroxaban  15 mg Oral Q supper   Continuous Infusions: . sodium chloride 100 mL/hr at 03/16/18 0639   PRN Meds: alum & mag hydroxide-simeth, iopamidol, iopamidol, ondansetron **OR** ondansetron (ZOFRAN) IV, senna   Vital Signs    Vitals:   03/17/18 1928 03/17/18 2352 03/18/18 0318 03/18/18 0735  BP: (!) 151/73 (!) 139/51 (!) 132/47 (!) 147/67  Pulse: 62 64 63 61  Resp: 16 16 (!) 76 16  Temp: 97.9 F (36.6 C) (!) 97.5 F (36.4 C) 97.6 F (36.4 C) 97.6 F (36.4 C)  TempSrc: Oral Oral Oral Oral  SpO2: 97% 94% 94% 95%  Weight:        Intake/Output Summary (Last 24 hours) at 03/18/2018 1018 Last data filed at 03/17/2018 1227 Gross per 24 hour  Intake 240 ml  Output -  Net 240 ml   Filed Weights   03/16/18 0500 03/17/18 0500  Weight: 151 lb 14.4 oz (68.9 kg) 151 lb 10.8 oz (68.8 kg)    Telemetry    AV pacing - Personally Reviewed  ECG    No new - Personally Reviewed  Physical Exam   Physical Exam: Blood pressure (!) 147/67, pulse 61, temperature 97.6 F (36.4 C), temperature source Oral, resp. rate 16, weight 151 lb 10.8 oz (68.8 kg), SpO2 95 %.  GEN:   Elderly female HEENT: Normal NECK: No JVD; No carotid bruits LYMPHATICS: No lymphadenopathy CARDIAC: RR , soft systolic murmur  RESPIRATORY:  Clear to auscultation without rales, wheezing or rhonchi  ABDOMEN: Soft, non-tender, non-distended MUSCULOSKELETAL:  No edema; No deformity  SKIN: Warm and dry NEUROLOGIC:  Alert and oriented x 3   Labs    Chemistry Recent Labs  Lab  03/15/18 0805 03/16/18 0611 03/17/18 0412  NA 140 141 140  K 4.0 3.9 3.7  CL 108 111 109  CO2 23 23 20*  GLUCOSE 92 93 94  BUN 20 14 11   CREATININE 1.26* 0.94 0.92  CALCIUM 9.0 8.6* 8.7*  PROT 6.0*  --   --   ALBUMIN 3.5  --   --   AST 37  --   --   ALT 40  --   --   ALKPHOS 52  --   --   BILITOT 0.6  --   --   GFRNONAA 36* 51* 53*  GFRAA 42* 60* >60  ANIONGAP 9 7 11      Hematology Recent Labs  Lab 03/15/18 0805 03/16/18 0611 03/17/18 0412  WBC 5.2 4.2 5.8  RBC 4.30 4.15 4.21  HGB 12.8 12.4 12.5  HCT 40.0 38.2 38.7  MCV 93.0 92.0 91.9  MCH 29.8 29.9 29.7  MCHC 32.0 32.5 32.3  RDW 14.0 13.9 14.0  PLT 164 163 179    Cardiac Enzymes Recent Labs  Lab 03/16/18 1652 03/16/18 2244 03/17/18 0412  TROPONINI <0.03 <0.03 <0.03    Recent Labs  Lab 03/15/18 0827  TROPIPOC 0.00     BNPNo results for input(s): BNP, PROBNP in the last 168 hours.   DDimer No  results for input(s): DDIMER in the last 168 hours.   Radiology    Ct Angio Head W Or Wo Contrast  Result Date: 03/16/2018 CLINICAL DATA:  84 y/o F; recent fall with trauma to the back of the head. Stroke evaluation. EXAM: CT ANGIOGRAPHY HEAD AND NECK TECHNIQUE: Multidetector CT imaging of the head and neck was performed using the standard protocol during bolus administration of intravenous contrast. Multiplanar CT image reconstructions and MIPs were obtained to evaluate the vascular anatomy. Carotid stenosis measurements (when applicable) are obtained utilizing NASCET criteria, using the distal internal carotid diameter as the denominator. CONTRAST:  62mL ISOVUE-370 IOPAMIDOL (ISOVUE-370) INJECTION 76% COMPARISON:  03/15/2018 CT head. FINDINGS: CT HEAD FINDINGS Brain: No evidence of acute infarction, hemorrhage, hydrocephalus, extra-axial collection or mass lesion/mass effect. Stable chronic microvascular ischemic changes and parenchymal volume loss of the brain. Stable small chronic infarction in left occipital lobe.  Stable bilateral low-attenuation chronic hygromas over the cerebral convexities. Vascular: As below. Skull: Normal. Negative for fracture or focal lesion. Sinuses: Right concha bullosa. Normal aeration of paranasal sinuses and mastoid air cells. Orbits: Bilateral intra-ocular lens replacement. Review of the MIP images confirms the above findings CTA NECK FINDINGS Aortic arch: Standard branching. Imaged portion shows no evidence of aneurysm or dissection. No significant stenosis of the major arch vessel origins. Right carotid system: No evidence of dissection, stenosis (50% or greater) or occlusion. Left carotid system: No evidence of dissection, stenosis (50% or greater) or occlusion. Vertebral arteries: Left dominant. No evidence of dissection, stenosis (50% or greater) or occlusion. Skeleton: Negative. Other neck: Thyroid nodules measuring up to 8 mm in the lower lower lobe. Upper chest: Clustered nodules in bronchovascular distribution at the lung peripheries. Review of the MIP images confirms the above findings CTA HEAD FINDINGS Anterior circulation: No significant stenosis, proximal occlusion, aneurysm, or vascular malformation. Posterior circulation: No significant stenosis, proximal occlusion, aneurysm, or vascular malformation. Venous sinuses: As permitted by contrast timing, patent. Anatomic variants: Patent small anterior communicating artery, small left posterior communicating artery, and right fetal PCA. Delayed phase: No abnormal intracranial enhancement. Review of the MIP images confirms the above findings IMPRESSION: 1. No acute intracranial abnormality on noncontrast CT of head. No abnormal enhancement after intravenous contrast administration. 2. Patent carotid and vertebral arteries. No dissection, aneurysm, or hemodynamically significant stenosis utilizing NASCET criteria. 3. Patent anterior and posterior intracranial circulation. No large vessel occlusion, aneurysm, or significant stenosis. 4.  Stable chronic microvascular ischemic changes and parenchymal volume loss of the brain. Stable bilateral convexity hygroma. Electronically Signed   By: Mitzi Hansen M.D.   On: 03/16/2018 20:00   Ct Angio Neck W Or Wo Contrast  Result Date: 03/16/2018 CLINICAL DATA:  84 y/o F; recent fall with trauma to the back of the head. Stroke evaluation. EXAM: CT ANGIOGRAPHY HEAD AND NECK TECHNIQUE: Multidetector CT imaging of the head and neck was performed using the standard protocol during bolus administration of intravenous contrast. Multiplanar CT image reconstructions and MIPs were obtained to evaluate the vascular anatomy. Carotid stenosis measurements (when applicable) are obtained utilizing NASCET criteria, using the distal internal carotid diameter as the denominator. CONTRAST:  74mL ISOVUE-370 IOPAMIDOL (ISOVUE-370) INJECTION 76% COMPARISON:  03/15/2018 CT head. FINDINGS: CT HEAD FINDINGS Brain: No evidence of acute infarction, hemorrhage, hydrocephalus, extra-axial collection or mass lesion/mass effect. Stable chronic microvascular ischemic changes and parenchymal volume loss of the brain. Stable small chronic infarction in left occipital lobe. Stable bilateral low-attenuation chronic hygromas over the cerebral convexities. Vascular: As  below. Skull: Normal. Negative for fracture or focal lesion. Sinuses: Right concha bullosa. Normal aeration of paranasal sinuses and mastoid air cells. Orbits: Bilateral intra-ocular lens replacement. Review of the MIP images confirms the above findings CTA NECK FINDINGS Aortic arch: Standard branching. Imaged portion shows no evidence of aneurysm or dissection. No significant stenosis of the major arch vessel origins. Right carotid system: No evidence of dissection, stenosis (50% or greater) or occlusion. Left carotid system: No evidence of dissection, stenosis (50% or greater) or occlusion. Vertebral arteries: Left dominant. No evidence of dissection, stenosis (50%  or greater) or occlusion. Skeleton: Negative. Other neck: Thyroid nodules measuring up to 8 mm in the lower lower lobe. Upper chest: Clustered nodules in bronchovascular distribution at the lung peripheries. Review of the MIP images confirms the above findings CTA HEAD FINDINGS Anterior circulation: No significant stenosis, proximal occlusion, aneurysm, or vascular malformation. Posterior circulation: No significant stenosis, proximal occlusion, aneurysm, or vascular malformation. Venous sinuses: As permitted by contrast timing, patent. Anatomic variants: Patent small anterior communicating artery, small left posterior communicating artery, and right fetal PCA. Delayed phase: No abnormal intracranial enhancement. Review of the MIP images confirms the above findings IMPRESSION: 1. No acute intracranial abnormality on noncontrast CT of head. No abnormal enhancement after intravenous contrast administration. 2. Patent carotid and vertebral arteries. No dissection, aneurysm, or hemodynamically significant stenosis utilizing NASCET criteria. 3. Patent anterior and posterior intracranial circulation. No large vessel occlusion, aneurysm, or significant stenosis. 4. Stable chronic microvascular ischemic changes and parenchymal volume loss of the brain. Stable bilateral convexity hygroma. Electronically Signed   By: Mitzi Hansen M.D.   On: 03/16/2018 20:00    Cardiac Studies   Echo 11/26/17 Study Conclusions  - Left ventricle: The cavity size was normal. Systolic function was   normal. The estimated ejection fraction was in the range of 50%   to 55%. Wall motion was normal; there were no regional wall   motion abnormalities. There was an increased relative   contribution of atrial contraction to ventricular filling.   Doppler parameters are consistent with abnormal left ventricular   relaxation (grade 1 diastolic dysfunction). - Aortic valve: Trileaflet; normal thickness, mildly calcified    leaflets. - Mitral valve: There was mild regurgitation. - Impressions: The findings indicate significant septal-lateral   left ventricular wall dyssynchrony due to RV pacing  Impressions:  - Low normal LVF with EF 50-55% with septal lateral wall   dyssynchrony due to RV pacing, mild TR and MR, grade 1 DD> The   findings indicate significant septal-lateral left ventricular   wall dyssynchrony due to RV pacing  Patient Profile     84 y.o. female with a hx of paroxysmal atrial fibrillation, DVT 11/2017 on chronic anticoagulation with low dose xarelto, complete heart block s/p PPM in place (05/2015), hypertensive heart disease without heart failure, orthostatic hypotension, CAD (qeustionable MI in 1975, nonobstuctive cath in 2011), hx of CVA x 2 and chronic diastolic heart failure now admitted with syncope.  Assessment & Plan    Syncope no arrhthymias on interrogation of PPM The syncope almost certainly was related to orthostatic hypotension.  She had no dysrhythmias on pacer interrogation. Is done well after we have held Lasix and verapamil.  I would discharge her to home on current medications.   episodic atrial tach  : We have stopped her verapamil because of profound orthostatic hypotension and episodes of syncope.  She will need further follow-up for her atrial tachycardia as an outpatient.  PAF maintaining  SR with AV pacing --Remains on Xarelto.  She has further episodes of falling we will need to consider discontinuation of the Xarelto.  HTN  Blood pressure is in the 140 range.  Continue current management.  We have stopped the verapamil and Lasix because of orthostatic hypotension.  Chronic diastolic HF  Lungs are clear on exam.  We have held the Lasix for now.  She may need to take it on an as-needed basis.  Peroneal DVT on xarelto      Kristeen Miss, MD  03/18/2018 10:21 AM    [facility] [address] [place],  [address] [place], [place]   [address] Pager [phone] Phone: [phone]; Fax: [phone]

## 2018-03-18 NOTE — Discharge Summary (Signed)
Physician Discharge Summary  [name] [name] ZOX:[id number] DOB: 1924-02-25 DOA: 03/15/2018  PCP: Olive Bass, MD  Admit date: 03/15/2018 Discharge date: 03/18/2018  Admitted From: Home  Disposition:  Home   Recommendations for Outpatient Follow-up:  1. Follow up with PCP in 1-2 weeks 2. Please obtain BMP/CBC in one week 3. Monitor BP, also monitor for orthostatic hypotension.   Home Health; yes.   Discharge Condition ; stable.  CODE STATUS: DNR Diet recommendation: Heart Healthy   Brief/Interim Summary: 84 year old female with right lower extremity DVT on Xarelto and history of pacemaker due to complete AV heart block multiple other medical problems presents after an episode of syncope. Her blood pressure medications have recently been adjusted. Her doses were increased due to her heart rate being too fast. He now presents relatively bradycardic concern for medication related syncope. We will admit her and work her up for syncope and consult neurology.   Assessment & Plan:   Active Problems:   High cholesterol   Coronary artery disease prior MI   Hypertensive heart disease   History of pulmonary embolism   Presence of cardiac pacemaker   Syncope and collapse   Cerebrovascular accident (CVA) due to thrombosis of right middle cerebral artery (HCC)   Chronic kidney disease, stage III (moderate) (HCC)   History of DVT (deep vein thrombosis)   Chronic anticoagulation   Chronic diastolic heart failure (HCC)   1-syncope; history of VT.  Recently saw her cardiologist , started on verapamil.  Unable to do MRI. Neurology recommended CT angio head and neck, which were negative Cardiology consulted. Syncope thought to be related to orthostatic hypotension. Verapamil and lasix stopped.  She is feeling better today. Stable for discharge    HTN; and now with orthostatic hypotension.  permissive HTN due to orthostatic    History of DVT RLE in 2014, PE PMP . [facility] artery  CVA, Afib CHADSVASC 6 on Xarelto Xarelto per Pharmacy dose   Diastolic CHF: Per cardiology Lasix PRN for increase weight of more than 3 pounds. Advised to check BP prior taking lasix.   Chest pain; on and off for weeks. Troponin negative. Already on anticoagulation.   COPD; stable.  CKD stage III; cr 0.9 Weakness; TSH normal, B 12 normal.  Needs PT>  Hip pain, x ray negative for fracture.     Discharge Diagnoses:  Active Problems:   High cholesterol   Coronary artery disease prior MI   Hypertensive heart disease   History of pulmonary embolism   Presence of cardiac pacemaker   Syncope and collapse   Cerebrovascular accident (CVA) due to thrombosis of right middle cerebral artery (HCC)   Chronic kidney disease, stage III (moderate) (HCC)   History of DVT (deep vein thrombosis)   Chronic anticoagulation   Chronic diastolic heart failure [facility])    Discharge Instructions  Discharge Instructions    Diet - low sodium heart healthy   Complete by:  As directed    Increase activity slowly   Complete by:  As directed      Allergies as of 03/18/2018      Reactions   Crestor [rosuvastatin] Other (See Comments)   Myalgia   Darvon Other (See Comments)   Drop in  BP   Meloxicam Other (See Comments)   Dizziness    Meperidine Other (See Comments)   Hypotension   Prednisone Diarrhea   POLYURIA   Valsartan Other (See Comments)    Hypotension   Amiodarone Other (See Comments)  Did not tolerate - unknown reaction   Aspirin-acetaminophen-caffeine Other (See Comments)   Unknown reaction   Nitroglycerin    Profound hypotension with sublingual nitroglycerin.    Tramadol Other (See Comments)   Unknown reaction      Medication List    STOP taking these medications   furosemide 20 MG tablet Commonly known as:  LASIX   verapamil 240 MG CR tablet Commonly known as:  CALAN-SR     TAKE these medications   pravastatin 20 MG tablet Commonly known as:  PRAVACHOL Take 1  tablet (20 mg total) by mouth daily at 6 PM.   Sennosides 15 MG Chew Chew 1 tablet by mouth as needed (constipation).   vitamin B-12 1000 MCG tablet Commonly known as:  CYANOCOBALAMIN Take 1,000 mcg by mouth daily.   XARELTO 15 MG Tabs tablet Generic drug:  Rivaroxaban TAKE 1 TABLET (15 MG TOTAL) BY MOUTH DAILY. STROKE PREVENTION, BLOOD THINNER       Allergies  Allergen Reactions  . Crestor [Rosuvastatin] Other (See Comments)    Myalgia  . Darvon Other (See Comments)    Drop in  BP  . Meloxicam Other (See Comments)    Dizziness   . Meperidine Other (See Comments)    Hypotension  . Prednisone Diarrhea    POLYURIA  . Valsartan Other (See Comments)     Hypotension  . Amiodarone Other (See Comments)    Did not tolerate - unknown reaction  . Aspirin-Acetaminophen-Caffeine Other (See Comments)    Unknown reaction  . Nitroglycerin     Profound hypotension with sublingual nitroglycerin.   . Tramadol Other (See Comments)    Unknown reaction    Consultations:  Cardiology    Procedures/Studies: Ct Angio Head W Or Wo Contrast  Result Date: 03/16/2018 CLINICAL DATA:  84 y/o F; recent fall with trauma to the back of the head. Stroke evaluation. EXAM: CT ANGIOGRAPHY HEAD AND NECK TECHNIQUE: Multidetector CT imaging of the head and neck was performed using the standard protocol during bolus administration of intravenous contrast. Multiplanar CT image reconstructions and MIPs were obtained to evaluate the vascular anatomy. Carotid stenosis measurements (when applicable) are obtained utilizing NASCET criteria, using the distal internal carotid diameter as the denominator. CONTRAST:  50mL ISOVUE-370 IOPAMIDOL (ISOVUE-370) INJECTION 76% COMPARISON:  03/15/2018 CT head. FINDINGS: CT HEAD FINDINGS Brain: No evidence of acute infarction, hemorrhage, hydrocephalus, extra-axial collection or mass lesion/mass effect. Stable chronic microvascular ischemic changes and parenchymal volume loss of  the brain. Stable small chronic infarction in left occipital lobe. Stable bilateral low-attenuation chronic hygromas over the cerebral convexities. Vascular: As below. Skull: Normal. Negative for fracture or focal lesion. Sinuses: Right concha bullosa. Normal aeration of paranasal sinuses and mastoid air cells. Orbits: Bilateral intra-ocular lens replacement. Review of the MIP images confirms the above findings CTA NECK FINDINGS Aortic arch: Standard branching. Imaged portion shows no evidence of aneurysm or dissection. No significant stenosis of the major arch vessel origins. Right carotid system: No evidence of dissection, stenosis (50% or greater) or occlusion. Left carotid system: No evidence of dissection, stenosis (50% or greater) or occlusion. Vertebral arteries: Left dominant. No evidence of dissection, stenosis (50% or greater) or occlusion. Skeleton: Negative. Other neck: Thyroid nodules measuring up to 8 mm in the lower lower lobe. Upper chest: Clustered nodules in bronchovascular distribution at the lung peripheries. Review of the MIP images confirms the above findings CTA HEAD FINDINGS Anterior circulation: No significant stenosis, proximal occlusion, aneurysm, or vascular malformation. Posterior circulation:  No significant stenosis, proximal occlusion, aneurysm, or vascular malformation. Venous sinuses: As permitted by contrast timing, patent. Anatomic variants: Patent small anterior communicating artery, small left posterior communicating artery, and right fetal PCA. Delayed phase: No abnormal intracranial enhancement. Review of the MIP images confirms the above findings IMPRESSION: 1. No acute intracranial abnormality on noncontrast CT of head. No abnormal enhancement after intravenous contrast administration. 2. Patent carotid and vertebral arteries. No dissection, aneurysm, or hemodynamically significant stenosis utilizing NASCET criteria. 3. Patent anterior and posterior intracranial circulation.  No large vessel occlusion, aneurysm, or significant stenosis. 4. Stable chronic microvascular ischemic changes and parenchymal volume loss of the brain. Stable bilateral convexity hygroma. Electronically Signed   By: Mitzi Hansen M.D.   On: 03/16/2018 20:00   Ct Head Wo Contrast  Result Date: 03/15/2018 CLINICAL DATA:  Fall with trauma to the back of the head and headache. EXAM: CT HEAD WITHOUT CONTRAST TECHNIQUE: Contiguous axial images were obtained from the base of the skull through the vertex without intravenous contrast. COMPARISON:  12/31/2017 FINDINGS: Brain: Chronic generalized brain atrophy. Chronic small-vessel ischemic changes of the hemispheric white matter. Old left occipital cortical and subcortical infarction. Bilateral low-density subdural hygromas, actually slightly smaller than on the previous study. No sign of any acute component. No hydrocephalus. Vascular: There is atherosclerotic calcification of the major vessels at the base of the brain. Skull: Negative.  No skull fracture. Sinuses/Orbits: Clear/normal Other: None IMPRESSION: No acute or traumatic finding. No skull fracture. No intracranial hemorrhage. Atrophy of the brain. Chronic small-vessel ischemic changes of the white matter. Old left occipital infarction. Chronic bilateral low-density subdural hygromas, slightly smaller than in February. No acute component. Electronically Signed   By: Paulina Fusi M.D.   On: 03/15/2018 08:58   Ct Angio Neck W Or Wo Contrast  Result Date: 03/16/2018 CLINICAL DATA:  84 y/o F; recent fall with trauma to the back of the head. Stroke evaluation. EXAM: CT ANGIOGRAPHY HEAD AND NECK TECHNIQUE: Multidetector CT imaging of the head and neck was performed using the standard protocol during bolus administration of intravenous contrast. Multiplanar CT image reconstructions and MIPs were obtained to evaluate the vascular anatomy. Carotid stenosis measurements (when applicable) are obtained  utilizing NASCET criteria, using the distal internal carotid diameter as the denominator. CONTRAST:  50mL ISOVUE-370 IOPAMIDOL (ISOVUE-370) INJECTION 76% COMPARISON:  03/15/2018 CT head. FINDINGS: CT HEAD FINDINGS Brain: No evidence of acute infarction, hemorrhage, hydrocephalus, extra-axial collection or mass lesion/mass effect. Stable chronic microvascular ischemic changes and parenchymal volume loss of the brain. Stable small chronic infarction in left occipital lobe. Stable bilateral low-attenuation chronic hygromas over the cerebral convexities. Vascular: As below. Skull: Normal. Negative for fracture or focal lesion. Sinuses: Right concha bullosa. Normal aeration of paranasal sinuses and mastoid air cells. Orbits: Bilateral intra-ocular lens replacement. Review of the MIP images confirms the above findings CTA NECK FINDINGS Aortic arch: Standard branching. Imaged portion shows no evidence of aneurysm or dissection. No significant stenosis of the major arch vessel origins. Right carotid system: No evidence of dissection, stenosis (50% or greater) or occlusion. Left carotid system: No evidence of dissection, stenosis (50% or greater) or occlusion. Vertebral arteries: Left dominant. No evidence of dissection, stenosis (50% or greater) or occlusion. Skeleton: Negative. Other neck: Thyroid nodules measuring up to 8 mm in the lower lower lobe. Upper chest: Clustered nodules in bronchovascular distribution at the lung peripheries. Review of the MIP images confirms the above findings CTA HEAD FINDINGS Anterior circulation: No significant stenosis, proximal  occlusion, aneurysm, or vascular malformation. Posterior circulation: No significant stenosis, proximal occlusion, aneurysm, or vascular malformation. Venous sinuses: As permitted by contrast timing, patent. Anatomic variants: Patent small anterior communicating artery, small left posterior communicating artery, and right fetal PCA. Delayed phase: No abnormal  intracranial enhancement. Review of the MIP images confirms the above findings IMPRESSION: 1. No acute intracranial abnormality on noncontrast CT of head. No abnormal enhancement after intravenous contrast administration. 2. Patent carotid and vertebral arteries. No dissection, aneurysm, or hemodynamically significant stenosis utilizing NASCET criteria. 3. Patent anterior and posterior intracranial circulation. No large vessel occlusion, aneurysm, or significant stenosis. 4. Stable chronic microvascular ischemic changes and parenchymal volume loss of the brain. Stable bilateral convexity hygroma. Electronically Signed   By: Mitzi Hansen M.D.   On: 03/16/2018 20:00   Dg Foot 2 Views Left  Result Date: 03/15/2018 CLINICAL DATA:  Generalized left foot pain after fall in the bathroom this morning. No history of previous injuries. EXAM: LEFT FOOT - 2 VIEW COMPARISON:  Left foot series dated August 21, 2011 FINDINGS: The bones are subjectively osteopenic. The phalanges are intact as are the metatarsals. The interphalangeal joints and the MTP joints are reasonably well-maintained. The tarsal bones appear intact. The intertarsal and tarsometatarsal joints are grossly normal. The soft tissues are unremarkable. Where visualized the distal tibia and fibula appear normal. IMPRESSION: There is no acute bony abnormality of the right foot. There is subjective diffuse osteopenia which appears to have progressed since the previous study. Electronically Signed   By: David  [place] M.D.   On: 03/15/2018 16:52       Subjective: Feeling better. Denies chest pain. Relates chronic hip pain  Less dizziness today   Discharge Exam: Vitals:   03/18/18 0318 03/18/18 0735  BP: (!) 132/47 (!) 147/67  Pulse: 63 61  Resp: (!) 76 16  Temp: 97.6 F (36.4 C) 97.6 F (36.4 C)  SpO2: 94% 95%   Vitals:   03/17/18 1928 03/17/18 2352 03/18/18 0318 03/18/18 0735  BP: (!) 151/73 (!) 139/51 (!) 132/47 (!) 147/67  Pulse:  62 64 63 61  Resp: 16 16 (!) 76 16  Temp: 97.9 F (36.6 C) (!) 97.5 F (36.4 C) 97.6 F (36.4 C) 97.6 F (36.4 C)  TempSrc: Oral Oral Oral Oral  SpO2: 97% 94% 94% 95%  Weight:        General: Pt is alert, awake, not in acute distress Cardiovascular: RRR, S1/S2 +, no rubs, no gallops Respiratory: CTA bilaterally, no wheezing, no rhonchi Abdominal: Soft, NT, ND, bowel sounds + Extremities: no edema, no cyanosis    The results of significant diagnostics from this hospitalization (including imaging, microbiology, ancillary and laboratory) are listed below for reference.     Microbiology: Recent Results (from the past 240 hour(s))  Urine culture     Status: Abnormal   Collection Time: 03/15/18  7:29 AM  Result Value Ref Range Status   Specimen Description URINE, CLEAN CATCH  Final   Special Requests NONE  Final   Culture (A)  Final    <10,000 COLONIES/mL INSIGNIFICANT GROWTH Performed at [facility], [address] [place], [place] [address]    Report Status 03/16/2018 FINAL  Final     Labs: BNP (last 3 results) Recent Labs    01/18/18 1607  BNP 40.5   Basic Metabolic Panel: Recent Labs  Lab 03/15/18 0805 03/16/18 0611 03/17/18 0412  NA 140 141 140  K 4.0 3.9 3.7  CL 108 111  109  CO2 23 23 20*  GLUCOSE 92 93 94  BUN 20 14 11   CREATININE 1.26* 0.94 0.92  CALCIUM 9.0 8.6* 8.7*   Liver Function Tests: Recent Labs  Lab 03/15/18 0805  AST 37  ALT 40  ALKPHOS 52  BILITOT 0.6  PROT 6.0*  ALBUMIN 3.5   No results for input(s): LIPASE, AMYLASE in the last 168 hours. No results for input(s): AMMONIA in the last 168 hours. CBC: Recent Labs  Lab 03/15/18 0805 03/16/18 0611 03/17/18 0412  WBC 5.2 4.2 5.8  NEUTROABS 3.4  --   --   HGB 12.8 12.4 12.5  HCT 40.0 38.2 38.7  MCV 93.0 92.0 91.9  PLT 164 163 179   Cardiac Enzymes: Recent Labs  Lab 03/16/18 1652 03/16/18 2244 03/17/18 0412  TROPONINI <0.03 <0.03 <0.03   BNP: Invalid input(s):  POCBNP CBG: Recent Labs  Lab 03/16/18 0639 03/17/18 0649 03/18/18 0732  GLUCAP 90 91 96   D-Dimer No results for input(s): DDIMER in the last 72 hours. Hgb A1c Recent Labs    03/16/18 0640  HGBA1C 5.5   Lipid Profile Recent Labs    03/16/18 0611  CHOL 166  HDL 49  LDLCALC 100*  TRIG 86  CHOLHDL 3.4   Thyroid function studies Recent Labs    03/15/18 1428  TSH 1.008   Anemia work up Recent Labs    03/16/18 0640  VITAMINB12 1,217*   Urinalysis    Component Value Date/Time   COLORURINE YELLOW 03/15/2018 1028   APPEARANCEUR CLEAR 03/15/2018 1028   LABSPEC 1.009 03/15/2018 1028   PHURINE 6.0 03/15/2018 1028   GLUCOSEU NEGATIVE 03/15/2018 1028   HGBUR NEGATIVE 03/15/2018 1028   BILIRUBINUR NEGATIVE 03/15/2018 1028   KETONESUR NEGATIVE 03/15/2018 1028   PROTEINUR NEGATIVE 03/15/2018 1028   UROBILINOGEN 0.2 12/06/2012 0916   NITRITE NEGATIVE 03/15/2018 1028   LEUKOCYTESUR NEGATIVE 03/15/2018 1028   Sepsis Labs Invalid input(s): PROCALCITONIN,  WBC,  LACTICIDVEN Microbiology Recent Results (from the past 240 hour(s))  Urine culture     Status: Abnormal   Collection Time: 03/15/18  7:29 AM  Result Value Ref Range Status   Specimen Description URINE, CLEAN CATCH  Final   Special Requests NONE  Final   Culture (A)  Final    <10,000 COLONIES/mL INSIGNIFICANT GROWTH Performed at [facility], [address], [place], [place] [address]    Report Status 03/16/2018 FINAL  Final     Time coordinating discharge: 35 minutes.   SIGNED:   Alba Cory, MD  [facility] 03/18/2018, 11:08 AM Pager [phone]  If 7PM-7AM, please contact night-coverage [email or web address] Password [email or web address]

## 2018-03-18 NOTE — Plan of Care (Signed)
  Problem: Education: Goal: Knowledge of patient specific risk factors addressed and post discharge goals established will improve Outcome: Progressing   

## 2018-03-18 NOTE — Progress Notes (Signed)
Physical Therapy Treatment Patient Details Name: [name] [name] MRN: [id number] DOB: [birth year] Today's Date: 03/18/2018    History of Present Illness Pt is a 84 y/o female admitted secondary to syncope and dizziness. CT negative for acute abnormality. PMH includes CAD, MI, CVA with L sided weakness, DVT, a fib, HTN, R TKA, and s/p pacemaker placement.     PT Comments    Pt up in chair on arrival and agreeable to participation in therapy. Pt ambulated 100 feet x 2 with RW min guard assist. Seated rest break needed after first gait trial due to fatigue. See below for further details. PT to continue per POC.    Follow Up Recommendations  Home health PT;Supervision/Assistance - 24 hour     Equipment Recommendations  None recommended by PT    Recommendations for Other Services       Precautions / Restrictions Precautions Precautions: Fall    Mobility  Bed Mobility               General bed mobility comments: Pt OOB in chair upon arrival  Transfers   Equipment used: Rolling walker (2 wheeled) Transfers: Sit to/from Stand Sit to Stand: Min guard         General transfer comment: Min guard for safety, increased time and effort  Ambulation/Gait Ambulation/Gait assistance: Min guard Ambulation Distance (Feet): 100 Feet(x 2) Assistive device: Rolling walker (2 wheeled) Gait Pattern/deviations: Step-through pattern;Decreased stance time - left Gait velocity: Decreased  Gait velocity interpretation: <1.31 ft/sec, indicative of household ambulator General Gait Details: Pt fatigues quickly. Gait quality affected by fatigue with decreased toe clearance LLE. Seated rest break after first ambulation trial. SOB noted. SpO2 97% HR 86. After 3 minute rest, pt able to ambulate another 100 feet.   Stairs             Wheelchair Mobility    Modified Rankin (Stroke Patients Only)       Balance   Sitting-balance support: Feet supported;No upper extremity  supported Sitting balance-Leahy Scale: Good     Standing balance support: During functional activity;No upper extremity supported Standing balance-Leahy Scale: Fair                              Cognition Arousal/Alertness: Awake/alert Behavior During Therapy: WFL for tasks assessed/performed Overall Cognitive Status: Within Functional Limits for tasks assessed                                        Exercises      General Comments        Pertinent Vitals/Pain Pain Assessment: Faces Faces Pain Scale: Hurts little more Pain Location: L hip during ambulation Pain Descriptors / Indicators: Aching;Sore Pain Intervention(s): Monitored during session    Home Living                      Prior Function            PT Goals (current goals can now be found in the care plan section) Acute Rehab PT Goals Patient Stated Goal: to go home  PT Goal Formulation: With patient Time For Goal Achievement: 03/30/18 Potential to Achieve Goals: Good Progress towards PT goals: Progressing toward goals    Frequency    Min 3X/week      PT Plan Current plan remains appropriate  Co-evaluation              AM-PAC PT "6 Clicks" Daily Activity  Outcome Measure  Difficulty turning over in bed (including adjusting bedclothes, sheets and blankets)?: None Difficulty moving from lying on back to sitting on the side of the bed? : A Little Difficulty sitting down on and standing up from a chair with arms (e.g., wheelchair, bedside commode, etc,.)?: A Little Help needed moving to and from a bed to chair (including a wheelchair)?: A Little Help needed walking in hospital room?: A Little Help needed climbing 3-5 steps with a railing? : A Little 6 Click Score: 19    End of Session Equipment Utilized During Treatment: Gait belt Activity Tolerance: Patient tolerated treatment well Patient left: with call bell/phone within reach;in chair;with chair  alarm set Nurse Communication: Mobility status PT Visit Diagnosis: Unsteadiness on feet (R26.81);Muscle weakness (generalized) (M62.81)     Time: 2956-2130 PT Time Calculation (min) (ACUTE ONLY): 24 min  Charges:  $Gait Training: 23-37 mins                    G Codes:       Aida Raider, PT  Office # [phone] Pager [phone]    Ilda Foil 03/18/2018, 10:29 AM

## 2018-03-18 NOTE — Progress Notes (Signed)
Occupational Therapy Treatment Patient Details Name: [name] [name] MRN: [id number] DOB: 07/10/1924 Today's Date: 03/18/2018    History of present illness Pt is a 84 y/o female admitted secondary to syncope and dizziness. CT negative for acute abnormality. PMH includes CAD, MI, CVA with L sided weakness, DVT, a fib, HTN, R TKA, and s/p pacemaker placement.    OT comments  Pt progressing towards OT goals; completing room level functional mobility, toileting and standing grooming ADLs using RW with MinGuard assist throughout; demonstrating shower transfer with MinA. Pt easily fatigued with activity and requiring seated rest breaks during session, SpO2 remaining at 94% and above on RA. Feel pt will benefit from [facility] services after discharge to maximize her overall safety and independence with ADLs, iADLs and functional mobility. Will continue to follow acutely to progress pt towards established OT goals.   Follow Up Recommendations  Home health OT;Supervision/Assistance - 24 hour    Equipment Recommendations  None recommended by OT          Precautions / Restrictions Precautions Precautions: Fall Restrictions Weight Bearing Restrictions: No       Mobility Bed Mobility               General bed mobility comments: Pt OOB in chair upon arrival  Transfers Overall transfer level: Needs assistance Equipment used: Rolling walker (2 wheeled) Transfers: Sit to/from Stand Sit to Stand: Min guard         General transfer comment: Min guard for safety, increased time and effort; completing sit<>stand x3 during session     Balance Overall balance assessment: Needs assistance Sitting-balance support: Feet supported;No upper extremity supported Sitting balance-Leahy Scale: Good     Standing balance support: During functional activity;No upper extremity supported Standing balance-Leahy Scale: Fair Standing balance comment: for static standing                            ADL either performed or assessed with clinical judgement   ADL Overall ADL's : Needs assistance/impaired     Grooming: Min guard;Standing;Wash/dry Electrical engineer Transfer: Min guard;Ambulation;RW;Grab bars Toilet Transfer Details (indicate cue type and reason): verbal cues to use grab bars for increased support  Toileting- Clothing Manipulation and Hygiene: Min guard;Sit to/from stand Toileting - Clothing Manipulation Details (indicate cue type and reason): pt completing clothing management and peri-care after voiding bladder with minguard for safety  Tub/ Shower Transfer: Walk-in shower;Min guard;Minimal assistance;3 in 1;Rolling walker;Grab bars Tub/Shower Transfer Details (indicate cue type and reason): minguard when entering, minA when exiting shower as pt's foot slightly catching ledge of shower  Functional mobility during ADLs: Min guard;Rolling walker General ADL Comments: Pt fatigues quickly with activity; continued education on energy conservation strategies and building endurance; SpO2 remaining 94% and above on RA, pt taking seated rest breaks during session                        Cognition Arousal/Alertness: Awake/alert Behavior During Therapy: WFL for tasks assessed/performed Overall Cognitive Status: Within Functional Limits for tasks assessed  Pertinent Vitals/ Pain       Pain Assessment: No/denies pain Faces Pain Scale: Hurts little more Pain Location: L hip during ambulation Pain Descriptors / Indicators: Aching;Sore Pain Intervention(s): Monitored during session                Frequency  Min 2X/week        Progress Toward Goals  OT Goals(current goals can now be found in the care plan section)  Progress towards OT goals: Progressing toward goals  Acute Rehab OT Goals Patient Stated Goal: to go home  OT Goal Formulation: With  patient Time For Goal Achievement: 03/31/18 Potential to Achieve Goals: Good  Plan Discharge plan remains appropriate                    AM-PAC PT "6 Clicks" Daily Activity     Outcome Measure   Help from another person eating meals?: None Help from another person taking care of personal grooming?: A Little Help from another person toileting, which includes using toliet, bedpan, or urinal?: A Little Help from another person bathing (including washing, rinsing, drying)?: A Little Help from another person to put on and taking off regular upper body clothing?: A Little Help from another person to put on and taking off regular lower body clothing?: A Little 6 Click Score: 19    End of Session Equipment Utilized During Treatment: Gait belt;Rolling walker  OT Visit Diagnosis: Unsteadiness on feet (R26.81);Muscle weakness (generalized) (M62.81)   Activity Tolerance Patient tolerated treatment well   Patient Left in chair;with call bell/phone within reach;with chair alarm set   Nurse Communication Mobility status        Time: 1610-9604 OT Time Calculation (min): 16 min  Charges: OT General Charges $OT Visit: 1 Visit OT Treatments $Self Care/Home Management : 8-22 mins  [name] [name], OT Pager [phone] 03/18/2018   [name] [name] 03/18/2018, 2:11 PM

## 2018-03-18 NOTE — Care Management Note (Addendum)
Case Management Note  Patient Details  Name: ELYZABETH LAWRIMORE MRN: [id number] Date of Birth: [birth year]  Subjective/Objective:   Pt admitted with syncopal episode                 Action/Plan:  PTA independent from home with husband - has all equipment needed.  Pt states she has [facility]CS privately paid for evening/early morning hours to assist her and her husband.  Pt states she will have 24 hour help/supervsion once discharge.  Pt requested [facility] - agency contacted and referral faxed/ accepted by agency.  Agency informed CM that services would not be able to start before Monday or Tuesday due to staffing constraints.  Pt is not interested in utilizing another agency - pt is in agreement with [facility] start date, pt declined HH aide.  CM informed both [facility] therapy  and attending  of service start date - pt has been deemed safe to discharge with HH start date of no later than 5/14.     Expected Discharge Date:  03/18/18               Expected Discharge Plan:  Home w Home Health Services  In-House Referral:     Discharge planning Services  CM Consult  Post Acute Care Choice:    Choice offered to:  Patient  DME Arranged:    DME Agency:     HH Arranged:  RN, PT, OT HH Agency:  [facility]  Status of Service:  Completed, signed off  If discussed at Long Length of Stay Meetings, dates discussed:    Additional Comments: Update:  [facility] verified HH orders received and referral officially accepted Cherylann Parr, RN 03/18/2018, 11:33 AM

## 2018-04-06 NOTE — Progress Notes (Signed)
Cardiology Office Note:    Date:  04/08/2018   ID:  [name] [name], DOB 10/05/1924, MRN [id number]  PCP:  Algis Greenhouse, MD  Cardiologist:  Shirlee More, MD    Referring MD: Algis Greenhouse, MD    ASSESSMENT:    1. Syncope, unspecified syncope type   2. Chronic diastolic heart failure ([place])   3. Hypertensive heart disease with chronic diastolic congestive heart failure ([place])   4. Chronic anticoagulation   5. PAT (paroxysmal atrial tachycardia) (HCC)   6. Paroxysmal atrial fibrillation (HCC)    PLAN:    In order of problems listed above:  1. Presumed due to orthostatic hypotension she will take her diuretic only for weight rises 5 pounds from beds baseline and at this time will not be any suppressant therapy for atrial arrhythmia unless she is having sustained frequent episode with pacemaker download.  The episode was atypical and I am not convinced that this is all orthostatic hypotension in nature.  I have asked the family member today to discuss with those were present during the last episode and to ensure she did not have any ictal activity with her previous stroke 2. Compensated she will take a loop diuretic only as needed for weight gain of 5 pounds 3. Stable blood pressure is above target but there is no way to give her an antihypertensive agent at this time 4. Continue her anticoagulant 5. Of treatment we will monitor her through refill telemetry of her pacemaker device 6. Stable no clinical recurrence   Next appointment: 3 months   Medication Adjustments/Labs and Tests Ordered: Current medicines are reviewed at length with the patient today.  Concerns regarding medicines are outlined above.  No orders of the defined types were placed in this encounter.  No orders of the defined types were placed in this encounter.   Chief Complaint  Patient presents with  . Follow-up  . Loss of Consciousness    History of Present Illness:    [name] [name] is a 84 y.o.  female with a hx of paroxysmal atrial fibrillation, DVT 1/2019on chronic anticoagulation with low dose xarelto, complete heart block s/p PPM in place (05/2015), hypertensive heart disease without heart failure, orthostatic hypotension, CAD (qeustionable MI in 1975, nonobstuctive cath in 2011),hx of CVA x 2and chronic diastolic heart failurelast seen 02/25/18.   She was admitted to [facility]/15/18 with syncope, Neurology recommended CT angio head and neck, which were negative Cardiology consulted. Syncope thought to be related to orthostatic hypotension however she had no orthostatic shift in BP documented.Marland Kitchen She was seen by dr Cathie Olden.  Verapamil and lasix were stopped. She had no dysrhythmias on pacer interrogation.  ATTENDING NOTE: I reviewed above note and agree with the assessment and plan. I have made any additions or clarifications directly to the above note. Pt was seen and examined. Patient sitting in chair comfortably, no family at bedside.  Patient currently at her neuro baseline.  Orthostatic vitals lying 155/60-60, sitting 160/81-59, standing 137/65-59.  CT repeat showed no evidence of acute abnormality, stable bilateral chronic hygroma.  CT head and neck unremarkable.  Patient is educated on adequate p.o. Intake, avoid dehydration, cautious with sitting in the standing up.  Patient is eager to go home.  PT/OT recommend home health PT/OT. Continue Xarelto for stroke prevention and also for DVT prevention. Continuelow-dosepravastatinfor stroke prevention. Neurology will sign off. Please call with questions.  No neuro follow-up needed at this time.  Thanks for the consult. Jindong  Erlinda Hong, MD PhD Stroke Neurology  ASSESSMENT:    03/07/18   1. PAT (paroxysmal atrial tachycardia) (HCC)   2. Paroxysmal atrial fibrillation ([place])   3. Hypertensive heart disease without heart failure   4. Presence of cardiac pacemaker   5. Kidney mass   6. Orthostatic hypotension    PLAN:    1. She has a  high supraventricular ectopic burden I will start her on a rate suppressant calcium channel blocker watching closely for symptomatic orthostatic hypotension.  Reassess in the office in 1 month reinterrogate her device 2. Rate limiting calcium channel blocker started continue her anticoagulant 3. Stable compensated continue minimal dose of diuretic 4. See below having high rate atrial activity device interrogated with the engineer 5. Not present on recent duplex 6. Stable asymptomatic    Compliance with diet, lifestyle and medications: Yes presently is off both her calcium channel blocker and diuretic Her episode of loss of consciousness occurred in the bathroom on the commode she felt that if something were to happen to call the family members lost consciousness no description of ictal activity but says that she has a fairly prolonged amnesia up to 10 to 15 minutes when she was in the ambulance headed to the hospital.  She had no documented orthostatic hypotension no seizure activity underwent an extensive evaluation in the end her diuretic was discontinued as of as well as her calcium channel blocker.  Since then she feels better her blood pressures sitting and standing runs 150/70-80.  She has not had palpitation edema shortness of breath or recurrent episode. Past Medical History:  Diagnosis Date  . Acute deep vein thrombosis (DVT) of right peroneal vein ([place]) 11/30/2017   Overview:  3154: uncomplicated, xarelto  . AKI (acute kidney injury) ([place]) 01/01/2018  . Arthritis   . Atrial fibrillation ([place]) 02/08/2016  . Benign essential HTN 01/15/2016  . Bradycardia with 31 - 40 beats per minute 05/14/2015  . Bronchiectasis, non-tuberculous ([place])    a. Followed by Dr. Joya Gaskins.  . Cerebrovascular accident (CVA) due to thrombosis of right middle cerebral artery ([place]) 02/07/2016   Overview:  2017: R-MCA thrombosis with left non-dom hemiparesis, thalamic capsule ischemia 2018: recurrent  . Chest pain with low  risk for cardiac etiology 11/18/2015  . Chronic kidney disease, stage III (moderate) ([place]) 01/15/2016  . Chronic pain of left knee 12/07/2016  . Closed compression fracture of fourth lumbar vertebra ([place]) 01/19/2017   Overview:  2015  . Closed fracture of ramus of left pubis ([place]) 06/18/2017   Overview:  2018  . Colon polyps   . Complete atrioventricular block ([place]) 01/15/2016   Overview:  2016: PM  . Complete heart block ([place])    a. s/p MDT dual chamber PPM   . COPD (chronic obstructive pulmonary disease) ([place])    past hx with pneumonia  . Coronary artery disease prior MI   . Diverticulosis   . DVT (deep venous thrombosis) (HCC)    Resolved, no anticoagulation currently  . Dyspnea on exertion 10/12/2017   Overview:  2018  . Edema -lower extremity 06/22/2013  . Fatigue 08/19/2012  . GERD (gastroesophageal reflux disease)   . Hammer toe of left foot 06/07/2017  . Headache   . Heart block   . Hemiparesis of left nondominant side as late effect of cerebral infarction ([place]) 12/22/2016  . High cholesterol   . History of pulmonary embolism 08/30/2012   PE 2013  >>repeat CT Angio chest 12/2013>>>NO PE   . Hypertension   .  IBS (irritable bowel syndrome)   . Left foot pain 06/07/2017  . Left leg pain 08/03/2016   Overview:  2017: related to weakness left leg from stroke  . Lumbar radiculopathy 07/27/2017  . 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 Malaise and fatigue 10/27/2017  . MI (myocardial infarction) ?? probably not 1975   a. 1975 Reported MI;  b. 2011 Cath: nonobs dzs;  c. 2013 Nonischemic Myoview;  d. 12/2012 Echo: EF 60-65%, Gr1 DD.  . Nephrolithiasis   . Orthostatic syncope 01/01/2018  . Osteoarthritis of right knee 12/12/2012  . Osteoporosis 08/01/2016  . Pain in limb 08/16/2013  . Pain of left femur 06/17/2017   Overview:  2018  . Palpitation    a. 2010 Event Monitor: PACs and PVCs  . Panlobular emphysema ([place]) 01/15/2016  . Paroxysmal supraventricular tachycardia ([place])  01/15/2016  . Pneumonia   . PONV (postoperative nausea and vomiting)   . Post-dural puncture headache 09/01/2017  . Presence of cardiac pacemaker 01/16/2016   July 5,2016 Overview:  2016: complete AVB  . Presence of permanent cardiac pacemaker   . Pulmonary fibrosis ([place])   . S/P left knee arthroscopy 04/01/2017  . S/P TKR (total knee replacement) using cement, right 12/07/2016  . Stroke ([place])   . Varicose veins of lower extremities with other complications 54/04/2702  . Vitamin D deficiency 08/01/2016  . Weakness generalized 11/25/2017  . Wears dentures    full  . Wide-complex tachycardia ([place])    a. Followed by Dr. Caryl Comes, "I think this is VT, but i am not sure QRSd 135 1:1 AV."    Past Surgical History:  Procedure Laterality Date  . APPENDECTOMY    . BLADDER SUSPENSION     tack  . CARDIAC CATHETERIZATION Right 05/14/2015   Procedure: Temporary Pacemaker;  Surgeon: Thompson Grayer, MD;  Location: [facility];  Service: Cardiovascular;  Laterality: Right;  . CHOLECYSTECTOMY    . COLON SURGERY     12inches colectomy s/p diverticulitis  . COLONOSCOPY W/ BIOPSIES AND POLYPECTOMY    . EP IMPLANTABLE DEVICE N/A 05/14/2015   Procedure: Pacemaker Implant;  Surgeon: Thompson Grayer, MD; Al[place]aya 1 (serial number [id number])     . IR FL GUIDED LOC OF NEEDLE/CATH TIP FOR SPINAL INJECTION LT  09/06/2017  . KNEE ARTHROSCOPY Left 03/29/2017   Procedure: ARTHROSCOPY LEFT KNEE WITH PARTIAL MEDIAL AND PARTIAL LATERAL MENISCECTOMY;  Surgeon: Vickey Huger, MD;  Location: [facility];  Service: Orthopedics;  Laterality: Left;  Marland Kitchen MULTIPLE TOOTH EXTRACTIONS    . TONSILLECTOMY    . TOTAL ABDOMINAL HYSTERECTOMY    . TOTAL KNEE ARTHROPLASTY  12/12/2012   Procedure: TOTAL KNEE ARTHROPLASTY;  Surgeon: Alta Corning, MD;  Location: [facility];  Service: Orthopedics;  Laterality: Right;  right total knee arthroplasty    Current Medications: Current Meds  Medication Sig  . Sennosides 15 MG CHEW Chew 1  tablet by mouth as needed (constipation).  . vitamin B-12 (CYANOCOBALAMIN) 1000 MCG tablet Take 1,000 mcg by mouth daily.  Alveda Reasons 15 MG TABS tablet TAKE 1 TABLET (15 MG TOTAL) BY MOUTH DAILY. STROKE PREVENTION, BLOOD THINNER     Allergies:   Crestor [rosuvastatin]; Darvon; Meloxicam; Meperidine; Prednisone; Valsartan; Amiodarone; Aspirin-acetaminophen-caffeine; Nitroglycerin; and Tramadol   Social History   Socioeconomic History  . Marital status: Married    Spouse name: Not on file  . Number of children: 3  . Years of education: Not on file  .  Highest education level: Not on file  Occupational History  . Occupation: Retired    Comment: Musician  Social Needs  . Financial resource strain: Not on file  . Food insecurity:    Worry: Not on file    Inability: Not on file  . Transportation needs:    Medical: Not on file    Non-medical: Not on file  Tobacco Use  . Smoking status: Never Smoker  . Smokeless tobacco: Never Used  Substance and Sexual Activity  . Alcohol use: No  . Drug use: No  . Sexual activity: Not on file  Lifestyle  . Physical activity:    Days per week: Not on file    Minutes per session: Not on file  . Stress: Not on file  Relationships  . Social connections:    Talks on phone: Not on file    Gets together: Not on file    Attends religious service: Not on file    Active member of club or organization: Not on file    Attends meetings of clubs or organizations: Not on file    Relationship status: Not on file  Other Topics Concern  . Not on file  Social History Narrative  . Not on file     Family History: The patient's family history includes Heart attack in her mother; Heart disease in her father; Other in her son; Stomach cancer in her brother. ROS:   Please see the history of present illness.    All other systems reviewed and are negative.  EKGs/Labs/Other Studies Reviewed:    The following studies were reviewed today:  Prior  to visit I reviewed hospital records including a neurology consultation office notes discharge summary pacemaker device interrogation.  Recent Labs: 01/18/2018: B Natriuretic Peptide 40.5 03/15/2018: ALT 40; TSH 1.008 03/17/2018: BUN 11; Creatinine, Ser 0.92; Hemoglobin 12.5; Platelets 179; Potassium 3.7; Sodium 140  Recent Lipid Panel    Component Value Date/Time   CHOL 166 03/16/2018 0611   TRIG 86 03/16/2018 0611   HDL 49 03/16/2018 0611   CHOLHDL 3.4 03/16/2018 0611   VLDL 17 03/16/2018 0611   LDLCALC 100 (H) 03/16/2018 0611    Physical Exam:    VS:  BP (!) 150/100 (BP Location: Left Arm, Patient Position: Sitting, Cuff Size: Normal)   Pulse 67   Ht 5' 6"  (1.676 m)   Wt 137 lb (62.1 kg)   SpO2 97%   BMI 22.11 kg/m     Wt Readings from Last 3 Encounters:  04/08/18 137 lb (62.1 kg)  03/17/18 151 lb 10.8 oz (68.8 kg)  03/07/18 139 lb 12.8 oz (63.4 kg)     GEN:  Well nourished, well developed in no acute distress HEENT: Normal NECK: No JVD; No carotid bruits LYMPHATICS: No lymphadenopathy CARDIAC: RRR, no murmurs, rubs, gallops RESPIRATORY:  Clear to auscultation without rales, wheezing or rhonchi  ABDOMEN: Soft, non-tender, non-distended MUSCULOSKELETAL:  No edema; No deformity  SKIN: Warm and dry NEUROLOGIC:  Alert and oriented x 3 PSYCHIATRIC:  Normal affect    Signed, Shirlee More, MD  04/08/2018 10:20 AM    [place]

## 2018-04-08 ENCOUNTER — Ambulatory Visit: Payer: Medicare HMO | Admitting: Cardiology

## 2018-04-08 ENCOUNTER — Encounter: Payer: Self-pay | Admitting: Cardiology

## 2018-04-08 VITALS — BP 150/100 | HR 67 | Ht 66.0 in | Wt 137.0 lb

## 2018-04-08 DIAGNOSIS — I110 Hypertensive heart disease with heart failure: Secondary | ICD-10-CM

## 2018-04-08 DIAGNOSIS — I471 Supraventricular tachycardia: Secondary | ICD-10-CM

## 2018-04-08 DIAGNOSIS — R55 Syncope and collapse: Secondary | ICD-10-CM | POA: Diagnosis not present

## 2018-04-08 DIAGNOSIS — I5032 Chronic diastolic (congestive) heart failure: Secondary | ICD-10-CM

## 2018-04-08 DIAGNOSIS — Z7901 Long term (current) use of anticoagulants: Secondary | ICD-10-CM | POA: Diagnosis not present

## 2018-04-08 DIAGNOSIS — I480 Paroxysmal atrial fibrillation: Secondary | ICD-10-CM | POA: Diagnosis not present

## 2018-04-08 MED ORDER — FUROSEMIDE 20 MG PO TABS: 20.0000 mg | ORAL_TABLET | Freq: Every day | ORAL | 11 refills | Status: DC | PRN

## 2018-04-08 NOTE — Patient Instructions (Signed)
Medication Instructions:  Your physician has recommended you make the following change in your medication:   Only take your furosemide if you weigh greater than 140 pounds daily.  Labwork: None  Testing/Procedures: None  Follow-Up: Your physician wants you to follow-up in: 3 months. You will receive a reminder letter in the mail two months in advance. If you don't receive a letter, please call our office to schedule the follow-up appointment.  Any Other Special Instructions Will Be Listed Below (If Applicable).     If you need a refill on your cardiac medications before your next appointment, please call your pharmacy.

## 2018-05-11 ENCOUNTER — Encounter: Payer: Medicare HMO | Admitting: *Deleted

## 2018-05-11 ENCOUNTER — Telehealth: Payer: Self-pay | Admitting: Cardiology

## 2018-05-11 NOTE — Telephone Encounter (Signed)
LMOVM reminding pt to send remote transmission.   

## 2018-05-13 ENCOUNTER — Encounter: Payer: Self-pay | Admitting: Cardiology

## 2018-05-18 ENCOUNTER — Emergency Department (HOSPITAL_COMMUNITY): Payer: Medicare HMO

## 2018-05-18 ENCOUNTER — Other Ambulatory Visit: Payer: Self-pay

## 2018-05-18 ENCOUNTER — Observation Stay (HOSPITAL_COMMUNITY)
Admission: EM | Admit: 2018-05-18 | Discharge: 2018-05-19 | Disposition: A | Discharge status: Home / Self Care | Payer: Medicare HMO | Attending: Internal Medicine | Admitting: Internal Medicine

## 2018-05-18 ENCOUNTER — Encounter (HOSPITAL_COMMUNITY): Payer: Self-pay | Admitting: *Deleted

## 2018-05-18 DIAGNOSIS — I6782 Cerebral ischemia: Secondary | ICD-10-CM | POA: Diagnosis not present

## 2018-05-18 DIAGNOSIS — I10 Essential (primary) hypertension: Secondary | ICD-10-CM

## 2018-05-18 DIAGNOSIS — Z8673 Personal history of transient ischemic attack (TIA), and cerebral infarction without residual deficits: Secondary | ICD-10-CM | POA: Insufficient documentation

## 2018-05-18 DIAGNOSIS — R0602 Shortness of breath: Secondary | ICD-10-CM | POA: Insufficient documentation

## 2018-05-18 DIAGNOSIS — J984 Other disorders of lung: Secondary | ICD-10-CM | POA: Diagnosis not present

## 2018-05-18 DIAGNOSIS — K219 Gastro-esophageal reflux disease without esophagitis: Secondary | ICD-10-CM | POA: Insufficient documentation

## 2018-05-18 DIAGNOSIS — I051 Rheumatic mitral insufficiency: Secondary | ICD-10-CM | POA: Insufficient documentation

## 2018-05-18 DIAGNOSIS — I700 Atherosclerosis of aorta: Secondary | ICD-10-CM | POA: Insufficient documentation

## 2018-05-18 DIAGNOSIS — N183 Chronic kidney disease, stage 3 (moderate): Secondary | ICD-10-CM | POA: Diagnosis not present

## 2018-05-18 DIAGNOSIS — J449 Chronic obstructive pulmonary disease, unspecified: Secondary | ICD-10-CM | POA: Diagnosis not present

## 2018-05-18 DIAGNOSIS — E559 Vitamin D deficiency, unspecified: Secondary | ICD-10-CM | POA: Diagnosis not present

## 2018-05-18 DIAGNOSIS — M1990 Unspecified osteoarthritis, unspecified site: Secondary | ICD-10-CM | POA: Insufficient documentation

## 2018-05-18 DIAGNOSIS — Z86711 Personal history of pulmonary embolism: Secondary | ICD-10-CM | POA: Insufficient documentation

## 2018-05-18 DIAGNOSIS — R071 Chest pain on breathing: Secondary | ICD-10-CM | POA: Diagnosis not present

## 2018-05-18 DIAGNOSIS — Z96651 Presence of right artificial knee joint: Secondary | ICD-10-CM | POA: Insufficient documentation

## 2018-05-18 DIAGNOSIS — I471 Supraventricular tachycardia: Secondary | ICD-10-CM | POA: Diagnosis not present

## 2018-05-18 DIAGNOSIS — I5032 Chronic diastolic (congestive) heart failure: Secondary | ICD-10-CM | POA: Diagnosis not present

## 2018-05-18 DIAGNOSIS — K589 Irritable bowel syndrome without diarrhea: Secondary | ICD-10-CM | POA: Diagnosis not present

## 2018-05-18 DIAGNOSIS — R531 Weakness: Secondary | ICD-10-CM | POA: Diagnosis not present

## 2018-05-18 DIAGNOSIS — Z87442 Personal history of urinary calculi: Secondary | ICD-10-CM | POA: Insufficient documentation

## 2018-05-18 DIAGNOSIS — N179 Acute kidney failure, unspecified: Secondary | ICD-10-CM | POA: Insufficient documentation

## 2018-05-18 DIAGNOSIS — I2510 Atherosclerotic heart disease of native coronary artery without angina pectoris: Secondary | ICD-10-CM | POA: Insufficient documentation

## 2018-05-18 DIAGNOSIS — Z86718 Personal history of other venous thrombosis and embolism: Secondary | ICD-10-CM | POA: Insufficient documentation

## 2018-05-18 DIAGNOSIS — R079 Chest pain, unspecified: Secondary | ICD-10-CM | POA: Diagnosis present

## 2018-05-18 DIAGNOSIS — M5416 Radiculopathy, lumbar region: Secondary | ICD-10-CM | POA: Diagnosis not present

## 2018-05-18 DIAGNOSIS — I480 Paroxysmal atrial fibrillation: Secondary | ICD-10-CM | POA: Diagnosis not present

## 2018-05-18 DIAGNOSIS — E7800 Pure hypercholesterolemia, unspecified: Secondary | ICD-10-CM | POA: Diagnosis not present

## 2018-05-18 DIAGNOSIS — I130 Hypertensive heart and chronic kidney disease with heart failure and stage 1 through stage 4 chronic kidney disease, or unspecified chronic kidney disease: Secondary | ICD-10-CM | POA: Diagnosis not present

## 2018-05-18 DIAGNOSIS — G319 Degenerative disease of nervous system, unspecified: Secondary | ICD-10-CM | POA: Diagnosis not present

## 2018-05-18 DIAGNOSIS — Z86010 Personal history of colonic polyps: Secondary | ICD-10-CM | POA: Insufficient documentation

## 2018-05-18 DIAGNOSIS — J8410 Pulmonary fibrosis, unspecified: Secondary | ICD-10-CM | POA: Insufficient documentation

## 2018-05-18 DIAGNOSIS — R0789 Other chest pain: Principal | ICD-10-CM | POA: Insufficient documentation

## 2018-05-18 DIAGNOSIS — F17210 Nicotine dependence, cigarettes, uncomplicated: Secondary | ICD-10-CM | POA: Diagnosis not present

## 2018-05-18 DIAGNOSIS — Z9889 Other specified postprocedural states: Secondary | ICD-10-CM | POA: Insufficient documentation

## 2018-05-18 DIAGNOSIS — I252 Old myocardial infarction: Secondary | ICD-10-CM | POA: Insufficient documentation

## 2018-05-18 DIAGNOSIS — I442 Atrioventricular block, complete: Secondary | ICD-10-CM | POA: Diagnosis not present

## 2018-05-18 DIAGNOSIS — Z95810 Presence of automatic (implantable) cardiac defibrillator: Secondary | ICD-10-CM | POA: Insufficient documentation

## 2018-05-18 LAB — CBG MONITORING, ED: Glucose-Capillary: 70 mg/dL (ref 70–99)

## 2018-05-18 LAB — DIFFERENTIAL
ABS IMMATURE GRANULOCYTES: 0 10*3/uL (ref 0.0–0.1)
BASOS PCT: 1 %
Basophils Absolute: 0.1 10*3/uL (ref 0.0–0.1)
Eosinophils Absolute: 0.2 10*3/uL (ref 0.0–0.7)
Eosinophils Relative: 4 %
Immature Granulocytes: 0 %
LYMPHS ABS: 1.6 10*3/uL (ref 0.7–4.0)
Lymphocytes Relative: 26 %
MONOS PCT: 8 %
Monocytes Absolute: 0.5 10*3/uL (ref 0.1–1.0)
NEUTROS ABS: 3.7 10*3/uL (ref 1.7–7.7)
Neutrophils Relative %: 61 %

## 2018-05-18 LAB — URINALYSIS, ROUTINE W REFLEX MICROSCOPIC
BACTERIA UA: NONE SEEN
BILIRUBIN URINE: NEGATIVE
Glucose, UA: NEGATIVE mg/dL
KETONES UR: NEGATIVE mg/dL
LEUKOCYTES UA: NEGATIVE
NITRITE: NEGATIVE
PROTEIN: NEGATIVE mg/dL
Specific Gravity, Urine: 1.006 (ref 1.005–1.030)
pH: 7 (ref 5.0–8.0)

## 2018-05-18 LAB — I-STAT CHEM 8, ED
BUN: 20 mg/dL (ref 8–23)
CHLORIDE: 108 mmol/L (ref 98–111)
CREATININE: 1.2 mg/dL — AB (ref 0.44–1.00)
Calcium, Ion: 1.23 mmol/L (ref 1.15–1.40)
Glucose, Bld: 88 mg/dL (ref 70–99)
HEMATOCRIT: 41 % (ref 36.0–46.0)
Hemoglobin: 13.9 g/dL (ref 12.0–15.0)
Potassium: 4.4 mmol/L (ref 3.5–5.1)
SODIUM: 141 mmol/L (ref 135–145)
TCO2: 23 mmol/L (ref 22–32)

## 2018-05-18 LAB — COMPREHENSIVE METABOLIC PANEL
ALT: 15 U/L (ref 0–44)
ANION GAP: 7 (ref 5–15)
AST: 22 U/L (ref 15–41)
Albumin: 4.1 g/dL (ref 3.5–5.0)
Alkaline Phosphatase: 47 U/L (ref 38–126)
BUN: 19 mg/dL (ref 8–23)
CALCIUM: 9.8 mg/dL (ref 8.9–10.3)
CO2: 25 mmol/L (ref 22–32)
Chloride: 108 mmol/L (ref 98–111)
Creatinine, Ser: 1.29 mg/dL — ABNORMAL HIGH (ref 0.44–1.00)
GFR calc non Af Amer: 35 mL/min — ABNORMAL LOW (ref >60)
GFR, EST AFRICAN AMERICAN: 40 mL/min — AB (ref >60)
GLUCOSE: 93 mg/dL (ref 70–99)
POTASSIUM: 4.4 mmol/L (ref 3.5–5.1)
Sodium: 140 mmol/L (ref 135–145)
TOTAL PROTEIN: 6.8 g/dL (ref 6.5–8.1)
Total Bilirubin: 0.8 mg/dL (ref 0.3–1.2)

## 2018-05-18 LAB — I-STAT TROPONIN, ED: Troponin i, poc: 0 ng/mL (ref 0.00–0.08)

## 2018-05-18 LAB — TROPONIN I: Troponin I: <0.03 ng/mL (ref <0.03)

## 2018-05-18 LAB — CBC
HCT: 43 % (ref 36.0–46.0)
HEMOGLOBIN: 13.6 g/dL (ref 12.0–15.0)
MCH: 29.8 pg (ref 26.0–34.0)
MCHC: 31.6 g/dL (ref 30.0–36.0)
MCV: 94.3 fL (ref 78.0–100.0)
Platelets: 164 10*3/uL (ref 150–400)
RBC: 4.56 MIL/uL (ref 3.87–5.11)
RDW: 13.4 % (ref 11.5–15.5)
WBC: 6.1 10*3/uL (ref 4.0–10.5)

## 2018-05-18 LAB — APTT: aPTT: 36 seconds (ref 24–36)

## 2018-05-18 LAB — PROTIME-INR
INR: 2.23
Prothrombin Time: 24.5 seconds — ABNORMAL HIGH (ref 11.4–15.2)

## 2018-05-18 MED ORDER — PRAVASTATIN SODIUM 40 MG PO TABS
20.0000 mg | ORAL_TABLET | Freq: Every day | ORAL | Status: DC
  Administered 2018-05-18 – 2018-05-19 (×2): 20 mg via ORAL
  Filled 2018-05-18 (×2): qty 1

## 2018-05-18 MED ORDER — DICLOFENAC SODIUM 1 % TD GEL
2.0000 g | Freq: Four times a day (QID) | TRANSDERMAL | Status: DC
  Administered 2018-05-19: 2 g via TOPICAL
  Filled 2018-05-18 (×2): qty 100

## 2018-05-18 MED ORDER — ENOXAPARIN SODIUM 30 MG/0.3ML ~~LOC~~ SOLN: 30.0000 mg | SUBCUTANEOUS | Status: DC

## 2018-05-18 MED ORDER — ACETAMINOPHEN 325 MG PO TABS
650.0000 mg | ORAL_TABLET | Freq: Four times a day (QID) | ORAL | Status: DC | PRN
Start: 2018-05-18 — End: 2018-05-19
  Administered 2018-05-19: 650 mg via ORAL
  Filled 2018-05-18: qty 2

## 2018-05-18 MED ORDER — ASPIRIN 81 MG PO CHEW
162.0000 mg | CHEWABLE_TABLET | Freq: Once | ORAL | Status: AC
  Administered 2018-05-18: 162 mg via ORAL
  Filled 2018-05-18: qty 2

## 2018-05-18 MED ORDER — AMLODIPINE BESYLATE 5 MG PO TABS
2.5000 mg | ORAL_TABLET | Freq: Every day | ORAL | Status: DC
  Administered 2018-05-18 – 2018-05-19 (×2): 2.5 mg via ORAL
  Filled 2018-05-18 (×2): qty 1

## 2018-05-18 MED ORDER — ACETAMINOPHEN 650 MG RE SUPP: 650.0000 mg | Freq: Four times a day (QID) | RECTAL | Status: DC | PRN

## 2018-05-18 MED ORDER — SODIUM CHLORIDE 0.9 % IV SOLN
INTRAVENOUS | Status: DC
  Administered 2018-05-18: 20:00:00 via INTRAVENOUS

## 2018-05-18 MED ORDER — RIVAROXABAN 15 MG PO TABS
15.0000 mg | ORAL_TABLET | Freq: Every day | ORAL | Status: DC
  Administered 2018-05-19: 15 mg via ORAL
  Filled 2018-05-18: qty 1

## 2018-05-18 MED ORDER — NITROGLYCERIN 2 % TD OINT
1.0000 [in_us] | TOPICAL_OINTMENT | Freq: Once | TRANSDERMAL | Status: AC
  Administered 2018-05-18: 1 [in_us] via TOPICAL
  Filled 2018-05-18: qty 1

## 2018-05-18 NOTE — ED Provider Notes (Signed)
[facility] EMERGENCY DEPARTMENT Provider Note   CSN: [id number] Arrival date & time: 05/18/18  1102     History   Chief Complaint Chief Complaint  Patient presents with  . Weakness    HPI [name] [name] is a 84 y.o. female.  84 yo F with a chief complaint of weakness.  This been going on for the past year or so.  She is gotten worse over the past month and then this morning she woke up and had chest pressure.  This got worse with exertion and better with rest.  Associated with shortness of breath and diaphoresis.  She had some nausea but denied vomiting.  Has a remote history of an MI back in the 51s.  She complains more of left-sided leg pain which is going on for years.  This is essentially unchanged.  The history is provided by the patient.  Weakness  Primary symptoms include no dizziness. This is a new problem. The current episode started yesterday. The problem has not changed since onset.There has been no fever. The fever has been present for less than 1 day. Associated symptoms include shortness of breath and chest pain. Pertinent negatives include no vomiting and no headaches. There were no medications administered prior to arrival.    Past Medical History:  Diagnosis Date  . Acute deep vein thrombosis (DVT) of right peroneal vein ([place]) 11/30/2017   Overview:  4034: uncomplicated, xarelto  . AKI (acute kidney injury) ([place]) 01/01/2018  . Arthritis   . Atrial fibrillation ([place]) 02/08/2016  . Benign essential HTN 01/15/2016  . Bradycardia with 31 - 40 beats per minute 05/14/2015  . Bronchiectasis, non-tuberculous ([place])    a. Followed by Dr. Joya Gaskins.  . Cerebrovascular accident (CVA) due to thrombosis of right middle cerebral artery ([place]) 02/07/2016   Overview:  2017: R-MCA thrombosis with left non-dom hemiparesis, thalamic capsule ischemia 2018: recurrent  . Chest pain with low risk for cardiac etiology 11/18/2015  . Chronic kidney disease, stage III (moderate)  ([place]) 01/15/2016  . Chronic pain of left knee 12/07/2016  . Closed compression fracture of fourth lumbar vertebra ([place]) 01/19/2017   Overview:  2015  . Closed fracture of ramus of left pubis ([place]) 06/18/2017   Overview:  2018  . Colon polyps   . Complete atrioventricular block ([place]) 01/15/2016   Overview:  2016: PM  . Complete heart block ([employer])    a. s/p MDT dual chamber PPM   . COPD (chronic obstructive pulmonary disease) ([place])    past hx with pneumonia  . Coronary artery disease prior MI   . Diverticulosis   . DVT (deep venous thrombosis) ([facility])    Resolved, no anticoagulation currently  . Dyspnea on exertion 10/12/2017   Overview:  2018  . Edema -lower extremity 06/22/2013  . Fatigue 08/19/2012  . GERD (gastroesophageal reflux disease)   . Hammer toe of left foot 06/07/2017  . Headache   . Heart block   . Hemiparesis of left nondominant side as late effect of cerebral infarction ([place]) 12/22/2016  . High cholesterol   . History of pulmonary embolism 08/30/2012   PE 2013  >>repeat CT Angio chest 12/2013>>>NO PE   . Hypertension   . IBS (irritable bowel syndrome)   . Left foot pain 06/07/2017  . Left leg pain 08/03/2016   Overview:  2017: related to weakness left leg from stroke  . Lumbar radiculopathy 07/27/2017  . 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 Malaise and fatigue  10/27/2017  . MI (myocardial infarction) ?? probably not 1975   a. 1975 Reported MI;  b. 2011 Cath: nonobs dzs;  c. 2013 Nonischemic Myoview;  d. 12/2012 Echo: EF 60-65%, Gr1 DD.  . Nephrolithiasis   . Orthostatic syncope 01/01/2018  . Osteoarthritis of right knee 12/12/2012  . Osteoporosis 08/01/2016  . Pain in limb 08/16/2013  . Pain of left femur 06/17/2017   Overview:  2018  . Palpitation    a. 2010 Event Monitor: PACs and PVCs  . Panlobular emphysema ([place]) 01/15/2016  . Paroxysmal supraventricular tachycardia ([place]) 01/15/2016  . Pneumonia   . PONV (postoperative nausea and vomiting)   . Post-dural  puncture headache 09/01/2017  . Presence of cardiac pacemaker 01/16/2016   July 5,2016 Overview:  2016: complete AVB  . Presence of permanent cardiac pacemaker   . Pulmonary fibrosis ([place])   . S/P left knee arthroscopy 04/01/2017  . S/P TKR (total knee replacement) using cement, right 12/07/2016  . Stroke ([place])   . Varicose veins of lower extremities with other complications 62/01/7627  . Vitamin D deficiency 08/01/2016  . Weakness generalized 11/25/2017  . Wears dentures    full  . Wide-complex tachycardia ([place])    a. Followed by Dr. Caryl Comes, "I think this is VT, but i am not sure QRSd 135 1:1 AV."    Patient Active Problem List   Diagnosis Date Noted  . Chronic anticoagulation 02/11/2018  . Orthostatic hypotension 02/11/2018  . Chronic diastolic heart failure ([place]) 02/11/2018  . AKI (acute kidney injury) ([place]) 01/01/2018  . Syncope and collapse 01/01/2018  . History of DVT (deep vein thrombosis) 11/30/2017  . Weakness generalized 11/25/2017  . Malaise and fatigue 10/27/2017  . Dyspnea on exertion 10/12/2017  . Post-dural puncture headache 09/01/2017  . Lumbar radiculopathy 07/27/2017  . Closed fracture of ramus of left pubis ([place]) 06/18/2017  . Pain of left femur 06/17/2017  . Hammer toe of left foot 06/07/2017  . Left foot pain 06/07/2017  . S/P left knee arthroscopy 04/01/2017  . Closed compression fracture of fourth lumbar vertebra ([place]) 01/19/2017  . Hemiparesis of left nondominant side as late effect of cerebral infarction ([place]) 12/22/2016  . Chronic pain of left knee 12/07/2016  . S/P TKR (total knee replacement) using cement, right 12/07/2016  . Left leg pain 08/03/2016  . Osteoporosis 08/01/2016  . Pulmonary fibrosis ([place]) 08/01/2016  . Vitamin D deficiency 08/01/2016  . Paroxysmal atrial fibrillation ([place]) 02/08/2016  . Cerebrovascular accident (CVA) due to thrombosis of right middle cerebral artery ([place]) 02/07/2016  . Presence of cardiac pacemaker 01/16/2016  .  Hypertensive heart disease 01/15/2016  . Chronic kidney disease, stage III (moderate) ([place]) 01/15/2016  . Complete atrioventricular block ([place]) 01/15/2016  . Panlobular emphysema ([place]) 01/15/2016  . Paroxysmal supraventricular tachycardia ([place]) 01/15/2016  . Chest pain with low risk for cardiac etiology 11/18/2015  . Bradycardia with 31 - 40 beats per minute 05/14/2015  . Heart block   . Pain in limb 08/16/2013  . Varicose veins of lower extremities with other complications 31/51/7616  . Edema -lower extremity 06/22/2013  . Osteoarthritis of right knee 12/12/2012  . History of pulmonary embolism 08/30/2012  . Fatigue 08/19/2012  . Wide-complex tachycardia ([place]) 05/30/2012  . Bronchiectasis, non-tuberculous ([place]) 01/06/2012  . 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 Diverticulosis   . High cholesterol   . Coronary artery disease prior MI     Past Surgical History:  Procedure Laterality Date  . APPENDECTOMY    .  BLADDER SUSPENSION     tack  . CARDIAC CATHETERIZATION Right 05/14/2015   Procedure: Temporary Pacemaker;  Surgeon: Thompson Grayer, MD;  Location: [facility];  Service: Cardiovascular;  Laterality: Right;  . CHOLECYSTECTOMY    . COLON SURGERY     12inches colectomy s/p diverticulitis  . COLONOSCOPY W/ BIOPSIES AND POLYPECTOMY    . EP IMPLANTABLE DEVICE N/A 05/14/2015   Procedure: Pacemaker Implant;  Surgeon: Thompson Grayer, MD; [place] 1 (serial number [id number])     . IR FL GUIDED LOC OF NEEDLE/CATH TIP FOR SPINAL INJECTION LT  09/06/2017  . KNEE ARTHROSCOPY Left 03/29/2017   Procedure: ARTHROSCOPY LEFT KNEE WITH PARTIAL MEDIAL AND PARTIAL LATERAL MENISCECTOMY;  Surgeon: Vickey Huger, MD;  Location: [facility];  Service: Orthopedics;  Laterality: Left;  Marland Kitchen MULTIPLE TOOTH EXTRACTIONS    . TONSILLECTOMY    . TOTAL ABDOMINAL HYSTERECTOMY    . TOTAL KNEE ARTHROPLASTY  12/12/2012   Procedure: TOTAL KNEE ARTHROPLASTY;  Surgeon: Alta Corning, MD;  Location: [facility];  Service: Orthopedics;  Laterality: Right;  right total knee arthroplasty     OB History   None      Home Medications    Prior to Admission medications   Medication Sig Start Date End Date Taking? Authorizing Provider  Sennosides 15 MG CHEW Chew 1 tablet by mouth as needed (constipation).   Yes [provider]  vitamin B-12 (CYANOCOBALAMIN) 1000 MCG tablet Take 1,000 mcg by mouth daily.   Yes [provider]  XARELTO 15 MG TABS tablet TAKE 1 TABLET (15 MG TOTAL) BY MOUTH DAILY. STROKE PREVENTION, BLOOD THINNER 01/28/18  Yes [provider]  furosemide (LASIX) 20 MG tablet Take 1 tablet (20 mg total) by mouth daily as needed. If weight greater than 140 pounds. Patient not taking: Reported on 05/18/2018 04/08/18 07/07/18  Richardo Priest, MD  pravastatin (PRAVACHOL) 20 MG tablet Take 1 tablet (20 mg total) by mouth daily at 6 PM. Patient not taking: Reported on 04/08/2018 03/18/18   Elmarie Shiley, MD    Family History Family History  Problem Relation Age of Onset  . Heart disease Father   . Heart attack Mother   . Stomach cancer Brother   . Other Son        Triple Bypass    Social History Social History   Tobacco Use  . Smoking status: Never Smoker  . Smokeless tobacco: Never Used  Substance Use Topics  . Alcohol use: No  . Drug use: No     Allergies   Crestor [rosuvastatin]; Darvon; Meloxicam; Meperidine; Prednisone; Valsartan; Amiodarone; Aspirin-acetaminophen-caffeine; Nitroglycerin; and Tramadol   Review of Systems Review of Systems  Constitutional: Negative for chills and fever.  HENT: Negative for congestion and rhinorrhea.   Eyes: Negative for redness and visual disturbance.  Respiratory: Positive for shortness of breath. Negative for wheezing.   Cardiovascular: Positive for chest pain. Negative for palpitations.  Gastrointestinal: Negative for nausea and vomiting.  Genitourinary: Negative for dysuria  and urgency.  Musculoskeletal: Negative for arthralgias and myalgias.  Skin: Negative for pallor and wound.  Neurological: Positive for weakness. Negative for dizziness and headaches.     Physical Exam Updated Vital Signs BP (!) 174/81   Pulse 61   Temp 97.8 F (36.6 C)   Resp 18   Ht _0  (1.676 m)   Wt 61.2 kg (135 lb)   SpO2 97%   BMI 21.79 kg/m  Physical Exam  Constitutional: She is oriented to person, place, and time. She appears well-developed and well-nourished. No distress.  HENT:  Head: Normocephalic and atraumatic.  Eyes: Pupils are equal, round, and reactive to light. EOM are normal.  Neck: Normal range of motion. Neck supple.  Cardiovascular: Normal rate and regular rhythm. Exam reveals no gallop and no friction rub.  No murmur heard. Pulmonary/Chest: Effort normal. She has no wheezes. She has no rales.  Abdominal: Soft. She exhibits no distension and no mass. There is no tenderness. There is no guarding.  Musculoskeletal: She exhibits no edema or tenderness.  Pulse motor and sensation is intact to the left lower extremity  Reflexes are 2+ no clonus negative Babinski  Neurological: She is alert and oriented to person, place, and time.  Skin: Skin is warm and dry. She is not diaphoretic.  Psychiatric: She has a normal mood and affect. Her behavior is normal.  Nursing note and vitals reviewed.    ED Treatments / Results  Labs (all labs ordered are listed, but only abnormal results are displayed) Labs Reviewed  PROTIME-INR - Abnormal; Notable for the following components:      Result Value   Prothrombin Time 24.5 (*)    All other components within normal limits  COMPREHENSIVE METABOLIC PANEL - Abnormal; Notable for the following components:   Creatinine, Ser 1.29 (*)    GFR calc non Af Amer 35 (*)    GFR calc Af Amer 40 (*)    All other components within normal limits  URINALYSIS, ROUTINE W REFLEX MICROSCOPIC - Abnormal; Notable for the following  components:   Color, Urine STRAW (*)    Hgb urine dipstick SMALL (*)    All other components within normal limits  I-STAT CHEM 8, ED - Abnormal; Notable for the following components:   Creatinine, Ser 1.20 (*)    All other components within normal limits  APTT  CBC  DIFFERENTIAL  I-STAT TROPONIN, ED  CBG MONITORING, ED    EKG EKG Interpretation  Date/Time:  Wednesday May 18 2018 11:08:13 EDT Ventricular Rate:  82 PR Interval:    QRS Duration: 104 QT Interval:  382 QTC Calculation: 446 R Axis:   -11 Text Interpretation:  Demand pacemaker; interpretation is based on intrinsic rhythm Sinus rhythm with Fusion complexes Left ventricular hypertrophy with repolarization abnormality Inferior infarct , age undetermined Anterior infarct , age undetermined Abnormal ECG Otherwise no significant change Confirmed by Deno Etienne [phone]) on 05/18/2018 12:08:42 PM   Radiology Ct Head Wo Contrast  Result Date: 05/18/2018 CLINICAL DATA:  Acute onset of pressure sensation in the head. Previous stroke. Left-sided weakness. EXAM: CT HEAD WITHOUT CONTRAST TECHNIQUE: Contiguous axial images were obtained from the base of the skull through the vertex without intravenous contrast. COMPARISON:  03/16/2018 FINDINGS: Brain: Generalized atrophy. Prominence of the subarachnoid spaces which is a chronic finding. Old infarction in the left occipital lobe. Chronic small-vessel changes of the hemispheric white matter. No sign of acute infarction, mass lesion, hemorrhage, hydrocephalus or subdural collection. Vascular: There is atherosclerotic calcification of the major vessels at the base of the brain. Skull: Negative Sinuses/Orbits: Clear/normal Other: None IMPRESSION: No acute or reversible finding. Chronic brain atrophy with prominence of the subarachnoid spaces. Old infarction left occipital lobe. Chronic small-vessel changes of the hemispheric white matter. Electronically Signed   By: Nelson Chimes M.D.   On:  05/18/2018 12:08    Procedures Procedures (including critical care time)  Medications Ordered in ED Medications  nitroGLYCERIN (NITROGLYN) 2 % ointment 1 inch (1 inch Topical Given 05/18/18 1322)  aspirin chewable tablet 162 mg (162 mg Oral Given 05/18/18 1322)     Initial Impression / Assessment and Plan / ED Course  I have reviewed the triage vital signs and the nursing notes.  Pertinent labs & imaging results that were available during my care of the patient were reviewed by me and considered in my medical decision making (see chart for details).     84 yo F with a chief complaint of chest pressure shortness of breath and weakness.  The symptoms are typical of ACS.  Her ECG is unchanged her troponin is negative.  I discussed the case with Dr. Burt Knack, cardiology with her advanced age and no EKG changes and a negative troponin recommended hospitalist admission.  They will come by and evaluate in consult.  She is not anemic creatinine is mildly elevated from baseline.  She had a CT of the head that was ordered in triage that is negative for acute process.   The patients results and plan were reviewed and discussed.   Any x-rays performed were independently reviewed by myself.   Differential diagnosis were considered with the presenting HPI.  Medications  nitroGLYCERIN (NITROGLYN) 2 % ointment 1 inch (1 inch Topical Given 05/18/18 1322)  aspirin chewable tablet 162 mg (162 mg Oral Given 05/18/18 1322)    Vitals:   05/18/18 1111 05/18/18 1117 05/18/18 1315 05/18/18 1316  BP: (!) 159/100  (!) 174/81   Pulse: 65  61   Resp: 18     Temp: 97.6 F (36.4 C)   97.8 F (36.6 C)  TempSrc: Oral     SpO2: 97%  97%   Weight:  61.2 kg (135 lb)    Height:  _0  (1.676 m)      Final diagnoses:  Chest pain with high risk for cardiac etiology    Admission/ observation were discussed with the admitting physician, patient and/or family and they are comfortable with the plan.    Final  Clinical Impressions(s) / ED Diagnoses   Final diagnoses:  Chest pain with high risk for cardiac etiology    ED Discharge Orders    None       Deno Etienne, DO 05/18/18 1347

## 2018-05-18 NOTE — ED Notes (Signed)
Pt CBG. 70 

## 2018-05-18 NOTE — Consult Note (Addendum)
Cardiology Consultation:   Patient ID: [name]; [id number]; [birth year]   Admit date: 05/18/2018 Date of Consult: 05/18/2018  Primary Care Provider: Algis Greenhouse, MD Primary Cardiologist: [name] More, MD Primary Electrophysiologist:  [name] [name]    Patient Profile:   [name] [name] is a 84 y.o. female with a PMH of CAD (?MI 78s, non-obstructive LHC 2011), HTN, HLD, paroxysmal atrial fibrillation on xarelto, chronic diastolic CHF, CHB s/p PPM, CVA x2, DVT, who is being seen today for the evaluation of chest pain at the request of [name] [name].  History of Present Illness:   [name] [name] reports feeling poorly for quite some time, primarily struggling with generalized weakness. She state she developed chest pressure this morning shortly after eating breakfast. She reports associated SOB and lightheadedness but denies syncope. She states her symptoms persisted throughout the morning prompting her to activate EMS. Her chest pain improved after arrival to the ED when nitroglycerin paste was applied to her chest. Upon further questioning, she reports that she has been having chest pressure episodes for "a while" but has difficulty discerning how frequently these episodes occur and how long they last. She does report an exertional component.   Patient was last seen by [name] [name] 03/2018 in follow-up of recent syncope admission. It was thought that she had orthostatic hypotension related to her diuretic use. Her diuretic and CCB were discontinued at discharge. She reported feeling improved at follow-up. She was recommended to take lasix as needed for weight gain of 5lbs. Decision was made to avoid antihypertensives at that time. Her last ischemic evaluation was a NST in 2016 which was low risk and without ischemia. She had a LHC in 2011 which revealed non-obstructive disease.   At the time of this evaluation, she reports some continued mild chest pressure although improved overall. She states her  last syncopal episode occurred one month ago while walking back to bed from the bathroom with her daughters assistance. She monitors her weight daily and has not noticed any weight changes. She has not needed to take any po lasix.   Hospital course: hypertensive, otherwise VSS. Labs notable for electrolytes wnl, Cr 1.20 (baseline 0.9-1.2), CBC wnl, INR 2.23, Trop negative x1, UA negative. CT Head without acute or reversible findings. EKG with paced rhythm. Patient was given ASA and nitro paste in the ED. Cardiology asked to evaluate for chest pain.   Past Medical History:  Diagnosis Date  . Acute deep vein thrombosis (DVT) of right peroneal vein ([place]) 11/30/2017   Overview:  7622: uncomplicated, xarelto  . AKI (acute kidney injury) ([place]) 01/01/2018  . Arthritis   . Atrial fibrillation ([place]) 02/08/2016  . Benign essential HTN 01/15/2016  . Bradycardia with 31 - 40 beats per minute 05/14/2015  . Bronchiectasis, non-tuberculous ([place])    a. Followed by Dr. Joya Gaskins.  . Cerebrovascular accident (CVA) due to thrombosis of right middle cerebral artery ([place]) 02/07/2016   Overview:  2017: R-MCA thrombosis with left non-dom hemiparesis, thalamic capsule ischemia 2018: recurrent  . Chest pain with low risk for cardiac etiology 11/18/2015  . Chronic kidney disease, stage III (moderate) ([place]) 01/15/2016  . Chronic pain of left knee 12/07/2016  . Closed compression fracture of fourth lumbar vertebra ([place]) 01/19/2017   Overview:  2015  . Closed fracture of ramus of left pubis ([place]) 06/18/2017   Overview:  2018  . Colon polyps   . Complete atrioventricular block ([place]) 01/15/2016   Overview:  2016: PM  . Complete  heart block (H[facility])    a. s/p MDT dual chamber PPM   . COPD (chronic obstructive pulmonary disease) ([place])    past hx with pneumonia  . Coronary artery disease prior MI   . Diverticulosis   . DVT (deep venous thrombosis) ([facility])    Resolved, no anticoagulation currently  . Dyspnea on exertion 10/12/2017    Overview:  2018  . Edema -lower extremity 06/22/2013  . Fatigue 08/19/2012  . GERD (gastroesophageal reflux disease)   . Hammer toe of left foot 06/07/2017  . Headache   . Heart block   . Hemiparesis of left nondominant side as late effect of cerebral infarction ([place]) 12/22/2016  . High cholesterol   . History of pulmonary embolism 08/30/2012   PE 2013  >>repeat CT Angio chest 12/2013>>>NO PE   . Hypertension   . IBS (irritable bowel syndrome)   . Left foot pain 06/07/2017  . Left leg pain 08/03/2016   Overview:  2017: related to weakness left leg from stroke  . Lumbar radiculopathy 07/27/2017  . 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 Malaise and fatigue 10/27/2017  . MI (myocardial infarction) ?? probably not 1975   a. 1975 Reported MI;  b. 2011 Cath: nonobs dzs;  c. 2013 Nonischemic Myoview;  d. 12/2012 Echo: EF 60-65%, Gr1 DD.  . Nephrolithiasis   . Orthostatic syncope 01/01/2018  . Osteoarthritis of right knee 12/12/2012  . Osteoporosis 08/01/2016  . Pain in limb 08/16/2013  . Pain of left femur 06/17/2017   Overview:  2018  . Palpitation    a. 2010 Event Monitor: PACs and PVCs  . Panlobular emphysema ([place]) 01/15/2016  . Paroxysmal supraventricular tachycardia ([place]) 01/15/2016  . Pneumonia   . PONV (postoperative nausea and vomiting)   . Post-dural puncture headache 09/01/2017  . Presence of cardiac pacemaker 01/16/2016   July 5,2016 Overview:  2016: complete AVB  . Presence of permanent cardiac pacemaker   . Pulmonary fibrosis ([place])   . S/P left knee arthroscopy 04/01/2017  . S/P TKR (total knee replacement) using cement, right 12/07/2016  . Stroke ([place])   . Varicose veins of lower extremities with other complications 97/0/2637  . Vitamin D deficiency 08/01/2016  . Weakness generalized 11/25/2017  . Wears dentures    full  . Wide-complex tachycardia ([place])    a. Followed by [name] [name], "I think this is VT, but i am not sure QRSd 135 1:1 AV."    Past Surgical History:    Procedure Laterality Date  . APPENDECTOMY    . BLADDER SUSPENSION     tack  . CARDIAC CATHETERIZATION Right 05/14/2015   Procedure: Temporary Pacemaker;  Surgeon: Thompson Grayer, MD;  Location: [facility];  Service: Cardiovascular;  Laterality: Right;  . CHOLECYSTECTOMY    . COLON SURGERY     12inches colectomy s/p diverticulitis  . COLONOSCOPY W/ BIOPSIES AND POLYPECTOMY    . EP IMPLANTABLE DEVICE N/A 05/14/2015   Procedure: Pacemaker Implant;  Surgeon: Thompson Grayer, MD; Kathleen 1 (serial number [id number])     . IR FL GUIDED LOC OF NEEDLE/CATH TIP FOR SPINAL INJECTION LT  09/06/2017  . KNEE ARTHROSCOPY Left 03/29/2017   Procedure: ARTHROSCOPY LEFT KNEE WITH PARTIAL MEDIAL AND PARTIAL LATERAL MENISCECTOMY;  Surgeon: Vickey Huger, MD;  Location: [facility];  Service: Orthopedics;  Laterality: Left;  Marland Kitchen MULTIPLE TOOTH EXTRACTIONS    . TONSILLECTOMY    . TOTAL ABDOMINAL HYSTERECTOMY    . TOTAL  KNEE ARTHROPLASTY  12/12/2012   Procedure: TOTAL KNEE ARTHROPLASTY;  Surgeon: Alta Corning, MD;  Location: [facility];  Service: Orthopedics;  Laterality: Right;  right total knee arthroplasty     Home Medications:  Prior to Admission medications   Medication Sig Start Date End Date Taking? Authorizing Provider  Sennosides 15 MG CHEW Chew 1 tablet by mouth as needed (constipation).   Yes [provider]  vitamin B-12 (CYANOCOBALAMIN) 1000 MCG tablet Take 1,000 mcg by mouth daily.   Yes [provider]  XARELTO 15 MG TABS tablet TAKE 1 TABLET (15 MG TOTAL) BY MOUTH DAILY. STROKE PREVENTION, BLOOD THINNER 01/28/18  Yes [provider]  furosemide (LASIX) 20 MG tablet Take 1 tablet (20 mg total) by mouth daily as needed. If weight greater than 140 pounds. Patient not taking: Reported on 05/18/2018 04/08/18 07/07/18  Richardo Priest, MD  pravastatin (PRAVACHOL) 20 MG tablet Take 1 tablet (20 mg total) by mouth daily at 6 PM. Patient not taking: Reported on  04/08/2018 03/18/18   Elmarie Shiley, MD    Inpatient Medications: Scheduled Meds:  Continuous Infusions:  PRN Meds:   Allergies:    Allergies  Allergen Reactions  . Crestor [Rosuvastatin] Other (See Comments)    Myalgia  . Darvon Other (See Comments)    Drop in  BP  . Meloxicam Other (See Comments)    Dizziness   . Meperidine Other (See Comments)    Hypotension  . Prednisone Diarrhea    POLYURIA  . Valsartan Other (See Comments)     Hypotension  . Amiodarone Other (See Comments)    Did not tolerate - unknown reaction  . Aspirin-Acetaminophen-Caffeine Other (See Comments)    Unknown reaction  . Nitroglycerin     Profound hypotension with sublingual nitroglycerin.   . Tramadol Other (See Comments)    Unknown reaction    Social History:   Social History   Socioeconomic History  . Marital status: Married    Spouse name: Not on file  . Number of children: 3  . Years of education: Not on file  . Highest education level: Not on file  Occupational History  . Occupation: Retired    Comment: Musician  Social Needs  . Financial resource strain: Not on file  . Food insecurity:    Worry: Not on file    Inability: Not on file  . Transportation needs:    Medical: Not on file    Non-medical: Not on file  Tobacco Use  . Smoking status: Never Smoker  . Smokeless tobacco: Never Used  Substance and Sexual Activity  . Alcohol use: No  . Drug use: No  . Sexual activity: Not on file  Lifestyle  . Physical activity:    Days per week: Not on file    Minutes per session: Not on file  . Stress: Not on file  Relationships  . Social connections:    Talks on phone: Not on file    Gets together: Not on file    Attends religious service: Not on file    Active member of club or organization: Not on file    Attends meetings of clubs or organizations: Not on file    Relationship status: Not on file  . Intimate partner violence:    Fear of current or ex  partner: Not on file    Emotionally abused: Not on file    Physically abused: Not on file    Forced sexual activity:  Not on file  Other Topics Concern  . Not on file  Social History Narrative  . Not on file    Family History:    Family History  Problem Relation Age of Onset  . Heart disease Father   . Heart attack Mother   . Stomach cancer Brother   . Other Son        Triple Bypass     ROS:  Please see the history of present illness.   All other ROS reviewed and negative.     Physical Exam/Data:   Vitals:   05/18/18 1111 05/18/18 1117 05/18/18 1315 05/18/18 1316  BP: (!) 159/100  (!) 174/81   Pulse: 65  61   Resp: 18     Temp: 97.6 F (36.4 C)   97.8 F (36.6 C)  TempSrc: Oral     SpO2: 97%  97%   Weight:  135 lb (61.2 kg)    Height:  _0  (1.676 m)     No intake or output data in the 24 hours ending 05/18/18 1644 Filed Weights   05/18/18 1117  Weight: 135 lb (61.2 kg)   Body mass index is 21.79 kg/m.  General:  Thin elderly female laying in bed in no acute distress, mildly anxious HEENT: sclera anicteric  Neck: no JVD Vascular: No carotid bruits; distal pulses 2+ bilaterally Cardiac:  normal S1, S2; RRR; no murmurs, gallops, or rubs Lungs:  clear to auscultation bilaterally, no wheezing, rhonchi or rales  Abd: NABS, soft, nontender, no hepatomegaly Ext: no edema Musculoskeletal:  No deformities, BUE and BLE strength normal and equal Skin: warm and dry  Neuro:  CNs 2-12 intact, no focal abnormalities noted Psych:  Normal affect   EKG:  The EKG was personally reviewed and demonstrates:  Paced rhythm Telemetry:  Telemetry was personally reviewed and demonstrates:  Paced rhythm with occasional PVCs and ?atrial fibrillation  Relevant CV Studies:  Echocardiogram 11/2017: Study Conclusions  - Left ventricle: The cavity size was normal. Systolic function was   normal. The estimated ejection fraction was in the range of 50%   to 55%. Wall motion was  normal; there were no regional wall   motion abnormalities. There was an increased relative   contribution of atrial contraction to ventricular filling.   Doppler parameters are consistent with abnormal left ventricular   relaxation (grade 1 diastolic dysfunction). - Aortic valve: Trileaflet; normal thickness, mildly calcified   leaflets. - Mitral valve: There was mild regurgitation. - Impressions: The findings indicate significant septal-lateral   left ventricular wall dyssynchrony due to RV pacing  Impressions:  - Low normal LVF with EF 50-55% with septal lateral wall   dyssynchrony due to RV pacing, mild TR and MR, grade 1 DD> The   findings indicate significant septal-lateral left ventricular   wall dyssynchrony due to RV pacing  NST 2016: Study Highlights    Nuclear stress EF: 60%.  The study is normal.  This is a low risk study.  The left ventricular ejection fraction is normal (55-65%).   [place] 2011: - LMCA - normal - LAD - ectatic 30-40% proximal LAD disease. Eccentric 70% proximal linear lesion in a modest size first diagonal branch - LCx - 30% proximal disease - RCA - 30-40% smooth mid right coronary disease - Ramus - Proximal 60% tubular disease  Laboratory Data:  Chemistry Recent Labs  Lab 05/18/18 1119 05/18/18 1146  NA 140 141  K 4.4 4.4  CL 108 108  CO2 25  --  GLUCOSE 93 88  BUN 19 20  CREATININE 1.29* 1.20*  CALCIUM 9.8  --   GFRNONAA 35*  --   GFRAA 40*  --   ANIONGAP 7  --     Recent Labs  Lab 05/18/18 1119  PROT 6.8  ALBUMIN 4.1  AST 22  ALT 15  ALKPHOS 47  BILITOT 0.8   Hematology Recent Labs  Lab 05/18/18 1119 05/18/18 1146  WBC 6.1  --   RBC 4.56  --   HGB 13.6 13.9  HCT 43.0 41.0  MCV 94.3  --   MCH 29.8  --   MCHC 31.6  --   RDW 13.4  --   PLT 164  --    Cardiac EnzymesNo results for input(s): TROPONINI in the last 168 hours.  Recent Labs  Lab 05/18/18 1144  TROPIPOC 0.00    BNPNo results for input(s):  BNP, PROBNP in the last 168 hours.  DDimer No results for input(s): DDIMER in the last 168 hours.  Radiology/Studies:  Dg Chest 2 View  Result Date: 05/18/2018 CLINICAL DATA:  Shortness of breath and chest pain for the last few days EXAM: CHEST - 2 VIEW COMPARISON:  Chest imaging dated 01/18/2018. FINDINGS: Dual lead pacer noted. Atherosclerotic calcification of the aortic arch. Atelectasis or scarring along the right lateral costophrenic angle. Mild enlargement of the cardiopericardial silhouette. Minimal scarring along the left hemidiaphragm. Scarring or atelectasis in the lingula. IMPRESSION: 1. Mild enlargement of the cardiopericardial silhouette, without edema. 2. Scarring or atelectasis along the right lateral costophrenic angle and in the lingula. 3.  Aortic Atherosclerosis (ICD10-I70.0). Electronically Signed   By: Van Clines M.D.   On: 05/18/2018 14:15   Ct Head Wo Contrast  Result Date: 05/18/2018 CLINICAL DATA:  Acute onset of pressure sensation in the head. Previous stroke. Left-sided weakness. EXAM: CT HEAD WITHOUT CONTRAST TECHNIQUE: Contiguous axial images were obtained from the base of the skull through the vertex without intravenous contrast. COMPARISON:  03/16/2018 FINDINGS: Brain: Generalized atrophy. Prominence of the subarachnoid spaces which is a chronic finding. Old infarction in the left occipital lobe. Chronic small-vessel changes of the hemispheric white matter. No sign of acute infarction, mass lesion, hemorrhage, hydrocephalus or subdural collection. Vascular: There is atherosclerotic calcification of the major vessels at the base of the brain. Skull: Negative Sinuses/Orbits: Clear/normal Other: None IMPRESSION: No acute or reversible finding. Chronic brain atrophy with prominence of the subarachnoid spaces. Old infarction left occipital lobe. Chronic small-vessel changes of the hemispheric white matter. Electronically Signed   By: Nelson Chimes M.D.   On: 05/18/2018  12:08    Assessment and Plan:   1. Chest pain in patient with non-obstructive CAD: patient reports intermittent chest pressure for "a while" which occurs both with exertion and at rest. She reports associated SOB and lightheadedness. Trop negative x1. EKG with paced rhythm. CXR without acute findings. Symptoms are both typical/atypical in nature. Possible she has had progression of disease since last LHC in 2011 which revealed non-obstructive disease (details above). - Trend trop x3 or to peak - Will check an echocardiogram to assess LV function - Will plan for a NST in the AM to further evaluate CP - not further ischemic work-up will be recommended if study is low-intermediate risk.  - Will start low dose amlodipine for both HTN and antianginal effects  2. HTN: BP elevated. Antihypertensives discontinued in May given syncopal episodes thought to be 2/2 orthostatic hypotension. Last syncope occurred one month ago.  -  Will start low dose amlodipine for both HTN and antianginal effects - Continue to monitor closely  3. Chronic diastolic CHF: Last echo 03/6388 with EF 50-55%, G1DD. She reports stable daily weights and has not needed prn lasix. She appears euvolemic on exam and CXR is without pulmonary edema.  - Continue to monitor volume status closely  4. Paroxysmal atrial fibrillation: appears to be fluctuating between paced rhythm with intermittent atrial fibrillation on telemetry.  - Continue xarelto for CHA2DS2-VASc Score of 8 (CHF, HTN, Vasc, Female, Age >70, stroke)   5. HLD: LDL 100 03/2018 - Continue statin   For questions or updates, please contact [facility] Please consult [email or web address] for contact info under Cardiology/STEMI.   Patient seen, examined. Available data reviewed. Agree with findings, assessment, and plan as outlined by Roby Lofts, PA-C.   The patient is independently interviewed and examined.  Her history is a bit difficult as the history reported to me is  somewhat typical with chest pressure on exertion as well as weakness.  However, in reviewing notes her pain symptoms sounded much [name] atypical.  Objectively I do not see any evidence of acute ischemia with normal troponins and an unremarkable EKG.  The patient is clearly at risk of acute coronary syndrome in the setting of her advanced age.  On my exam today, she is not pleasant elderly woman in no distress.  Lung fields are clear, heart is regular rate and rhythm with no murmur gallop, abdomen is soft and nontender, extremities have no edema.  The patient has chronic symptoms and appears to have some progression recently prompting this emergency room evaluation.  It has been a few years since her last ischemic evaluation and I would recommend proceeding with a Lexiscan Myoview stress test to rule out significant myocardial ischemia.  Since she is chronically anticoagulated and has negative enzymes, I would not put her on IV heparin at this time.  Plan is reviewed with the patient and she agrees to proceed with stress testing tomorrow.  She should be made n.p.o. after midnight.  Sherren Mocha, M.D. 05/18/2018 4:45 PM   Signed, Sherren Mocha, MD  05/18/2018 4:44 PM [phone]

## 2018-05-18 NOTE — ED Notes (Signed)
Patient transported to X-ray 

## 2018-05-18 NOTE — ED Triage Notes (Signed)
Pt arrived via EMS from home . Pt reports an onset of Cheast Pressure ,SHOB , Fulness in head . Pt had a Stroke on March 2019 and has Lt sided weakness and needs  a walker . Pt is able to walk out to get her mail.

## 2018-05-18 NOTE — H&P (Addendum)
Date: 05/18/2018               Patient Name:  [name] [name] MRN: [id number]  DOB: [birth year] Age / Sex: 84 y.o., female   PCP: Algis Greenhouse, MD              Medical Service: Internal Medicine Teaching Service              Attending Physician: Dr. Deno Etienne, DO    First Contact: Rhetta Mura, MS Pager: [phone]  Second Contact: Dr. Jari Favre Pager:  [phone]            After Hours (After 5p/  First Contact Pager: [phone]  weekends / holidays): Second Contact Pager: [phone]   Chief Complaint: Weakness and Shortness of Breath   History of Present Illness: [name] [name] is a 47 yoF with a PMH of DVT on Xarelto, heart block s/p pacemaker insertion, CVA in 2017 with residual LE weakness, left leg paraesthesias, Chronic Diastolic HF, paroxysmal Afib, who presents with increased weakness, breathlessness and chest pressure that began this morning. She has previous admissions in 05/19 and 02/19 for syncope, which was determined to be orthostatic or undetermined in nature. As a result of those admissions she was taken off all of her antihypertensive medications and lasix. She states that one year ago, after an injection in her back, she began to experience increased weakness and a lack of energy. She was at or around this baseline of health until this morning when she was making breakfast and suddenly began to feel short of breath and weak. She tried to put away some dishes, an activity which she is usually able to complete and felt "weak like she was going to fall to pieces." She endorses increased weakness in her chronically weak left leg, increased SOB associated with a dry cough, and chest pressure. The chest pain did not occur with exertion, it is exacerbated by breathing in. She denies feeling febrile, productive cough, sick contacts, changes in urinary frequency, pain with urination, changes in bowel movements, abdominal pain, changes in vision, falls or loss of consciousness. She does endorse an  occipital headache, which she has had intermittently for a long time. This is not increasing in frequency or severity recently. She also endorses feeling like keeping her balance is more difficult. She denies feeling like the room is spinning around her, dizziness upon standing or recent falls.  In the ED the patient had an EKG performed that was unchanged from prior ECGs and a troponin, which was negative. The chest pain was not relieved with nitroglycerine. She had a CT head, which did not reveal any acute intracranial processes, a chest x-ray which revealed atherosclerosis of the aorta and some scarring/ atelectasis, but no infiltrates. Thus far she has been afebrile, with stable vitals. She has been hypertensive since admission. She has had a UA that did not suggest acute infection and has a white count of 6.1, within normal limits. The rest of her CBC, including her Hg/Hct was unremarkable. Her BMP was significant only for a Cr of 1.29, increased from 0.92 on 0508/19.     Meds:  Current Meds  Medication Sig  . Sennosides 15 MG CHEW Chew 1 tablet by mouth as needed (constipation).  . vitamin B-12 (CYANOCOBALAMIN) 1000 MCG tablet Take 1,000 mcg by mouth daily.  Alveda Reasons 15 MG TABS tablet TAKE 1 TABLET (15 MG TOTAL) BY MOUTH DAILY. STROKE PREVENTION, BLOOD THINNER     Allergies: Allergies as  of 05/18/2018 - Review Complete 05/18/2018  Allergen Reaction Noted  . Crestor [rosuvastatin] Other (See Comments) 05/05/2012  . Darvon Other (See Comments)   . Meloxicam Other (See Comments) 04/16/2016  . Meperidine Other (See Comments) 04/16/2016  . Prednisone Diarrhea 01/16/2014  . Valsartan Other (See Comments) 04/16/2016  . Amiodarone Other (See Comments) 11/27/2013  . Aspirin-acetaminophen-caffeine Other (See Comments) 04/16/2016  . Nitroglycerin  09/02/2017  . Tramadol Other (See Comments) 04/16/2016   Past Medical History:  Diagnosis Date  . Acute deep vein thrombosis (DVT) of right  peroneal vein ([place]) 11/30/2017   Overview:  1607: uncomplicated, xarelto  . AKI (acute kidney injury) ([place]) 01/01/2018  . Arthritis   . Atrial fibrillation ([place]) 02/08/2016  . Benign essential HTN 01/15/2016  . Bradycardia with 31 - 40 beats per minute 05/14/2015  . Bronchiectasis, non-tuberculous ([place])    a. Followed by Dr. Joya Gaskins.  . Cerebrovascular accident (CVA) due to thrombosis of right middle cerebral artery ([place]) 02/07/2016   Overview:  2017: R-MCA thrombosis with left non-dom hemiparesis, thalamic capsule ischemia 2018: recurrent  . Chest pain with low risk for cardiac etiology 11/18/2015  . Chronic kidney disease, stage III (moderate) ([place]) 01/15/2016  . Chronic pain of left knee 12/07/2016  . Closed compression fracture of fourth lumbar vertebra ([place]) 01/19/2017   Overview:  2015  . Closed fracture of ramus of left pubis ([place]) 06/18/2017   Overview:  2018  . Colon polyps   . Complete atrioventricular block ([place]) 01/15/2016   Overview:  2016: PM  . Complete heart block ([place])    a. s/p MDT dual chamber PPM   . COPD (chronic obstructive pulmonary disease) ([place])    past hx with pneumonia  . Coronary artery disease prior MI   . Diverticulosis   . DVT (deep venous thrombosis) ([facility])    Resolved, no anticoagulation currently  . Dyspnea on exertion 10/12/2017   Overview:  2018  . Edema -lower extremity 06/22/2013  . Fatigue 08/19/2012  . GERD (gastroesophageal reflux disease)   . Hammer toe of left foot 06/07/2017  . Headache   . Heart block   . Hemiparesis of left nondominant side as late effect of cerebral infarction ([place]) 12/22/2016  . High cholesterol   . History of pulmonary embolism 08/30/2012   PE 2013  >>repeat CT Angio chest 12/2013>>>NO PE   . Hypertension   . IBS (irritable bowel syndrome)   . Left foot pain 06/07/2017  . Left leg pain 08/03/2016   Overview:  2017: related to weakness left leg from stroke  . Lumbar radiculopathy 07/27/2017  . MAC (mycobacterium avium-intracellulare  complex) 11/2011  . Malaise and fatigue 10/27/2017  . MI (myocardial infarction) ?? probably not 1975   a. 1975 Reported MI;  b. 2011 Cath: nonobs dzs;  c. 2013 Nonischemic Myoview;  d. 12/2012 Echo: EF 60-65%, Gr1 DD.  . Nephrolithiasis   . Orthostatic syncope 01/01/2018  . Osteoarthritis of right knee 12/12/2012  . Osteoporosis 08/01/2016  . Pain in limb 08/16/2013  . Pain of left femur 06/17/2017   Overview:  2018  . Palpitation    a. 2010 Event Monitor: PACs and PVCs  . Panlobular emphysema ([place]) 01/15/2016  . Paroxysmal supraventricular tachycardia ([place]) 01/15/2016  . Pneumonia   . PONV (postoperative nausea and vomiting)   . Post-dural puncture headache 09/01/2017  . Presence of cardiac pacemaker 01/16/2016   July 5,2016 Overview:  2016: complete AVB  . Presence of permanent cardiac pacemaker   .  Pulmonary fibrosis ([place])   . S/P left knee arthroscopy 04/01/2017  . S/P TKR (total knee replacement) using cement, right 12/07/2016  . Stroke ([place])   . Varicose veins of lower extremities with other complications 62/0/3559  . Vitamin D deficiency 08/01/2016  . Weakness generalized 11/25/2017  . Wears dentures    full  . Wide-complex tachycardia ([place])    a. Followed by Dr. Caryl Comes, "I think this is VT, but i am not sure QRSd 135 1:1 AV."    Family History:   Social History: Patient had to stop driving. She lives at home  In [place] and is accompanied to the hospital today by her daughter. She lives at home with her husband and is relatively independent on her ADLS. However, she has a nurse that comes in a couple times a week to provide her assistance. She smoked 2-3 cigarettes a day for 5 years but has never drank consistently or done illicit drugs.    Review of Systems: A complete ROS was negative except as per HPI.   Physical Exam: Blood pressure (!) 174/81, pulse 61, temperature 97.8 F (36.6 C), resp. rate 18, height _0  (1.676 m), weight 61.2 kg (135 lb), SpO2 97 %. General: No acute  distress, pleasant elderly woman capable of sharp conversation, lying very still in bed  HEENT: PERR, Dentures in place, oropharynx clear, no anterior or posterior cervical lymphadenopathy  Pulm: No increased work of breathing, CAB  CV: Rhythm irregular, rate regular, nMRG appreciated Msk: tenderness to palpation of chest   Ext: 1+ pitting edema in bilateral lower extremities Neuro: CN II-XII intact, RUE 5/5 strength, LUE 4/5 strength, RLE 5/5 strength, LLE 4/5 strength, sensation intact in upper and lower extremities   EKG: personally reviewed my interpretation is paced rhythm with large complexes and no signs of acute ischemia. Similar to 05/18 ECG    CXR: personally reviewed my interpretation is atherosclerosis of aorta, but no infiltrates or acute cardiopulmonary processes evident   Assessment & Plan by Problem: Active Problems:   * No active hospital problems. *   Ms. Canal is a 84 year old female with a PMH of DVT on Xarelto, heart block s/p pacemaker insertion, CVA in 2017 with residual LE weakness, left leg paraesthesias, Chronic Diastolic HF, paroxysmal Afib, who presents with increased weakness, breathlessness and chest pressure that began this morning.   #Chest Pain- The chest pain is not related to exertion and is exacerbated by breathing in and by palpation of the chest wall. The pain was not improved by the administration of nitroglycerine. The patient has an EKG that was unchanged from previous in 05/19 and one negative troponin. The relation of her pain to breathing, in addition to the thus far negative cardiac workup makes a non-cardiac etiology seem more likely. In addition the patient has been afebrile with no white count and a xray that is not suggestive of pneumonia, pneumothorax or other acute process that could explain the onset of pain. She denies symptoms of GERD including dyspepsia and water brash.  -trend troponin x2 in patient with significant ACS risk based on age and  prior cardiac history -consider topical NSAIDs non-cardiac chest pain -telemetry monitoring    #Weakness- This seems to be an acute on chronic problem. The patient endorses worsening of these symptoms today after feeling weak and tired for the last year. The patient is afebrile with no symptoms that suggest infection and no white count. UA was negative with WBC count within normal  range, making acute infectious etiology less likely. CT head does not suggest an acute intracranial process and physical exam is only significant for defects taht are attributed to the previous CVA in March of 2017. The patient is already taking vitamin B-12 supplementation, had a TSH check at her last admission in 03/2018 and did not have any electrolyte abnormalities evident on BMP that could contribute to increased weakness, fatigue or vague neurological symptoms. The patient was recently taken off all her antihypertensives and lasix and now only takes 2 mediations, making polypharmacy and medication-induced orthostatic hypotension an unlikely cause of this presentation. The weakness is likely an acute on chronic result of her lingering neurological defects and could be exacerbated by her perception of the other symptoms.  -PT/OT evaluation  -consider rechecking TSH given that it has downtrended over the last year    #Shortness of Breath: This symptom seems to be a part of the generalized malaise than part of an acute respiratory process. However, it is still important to consider organic etiologies of acute onset SOB in this patient. She has been worked up for cardiac causes of SOB and decompensation as discussed above. In the setting of a patient with chronic hypertension who is no longer taking any anti-hypertensives or lasix, pulmonary edema is a possibility. However, the patient's lungs sound clear on exam in accordance with her chest x-ray, which does not suggest fluid overload. Finally, it is important to consider PE in a  patient with a reported history of vascular events and with age as a significant risk factor. However, the patient's HR is <100, she has no evidence of DVT, is satting well on room air and has no recent long periods of immobilization. With this in mind, the patient's Well's score for PE is 1.5, putting her in a low risk group for this condition. Furthermore, she is already reveiving anticoagulation with Xarelto due to paroxysmal Afib. The patient's SOB could be related to anxiety but this is a diagnosis of exclusion, which should only be reached after further assessment on the role of hypertension and fluid overload in this presentation.  -Ambulate to assess for need for home health O2   Dispo: Admit patient to Inpatient with expected length of stay greater than 2 midnights.  Signed: Rhetta Mura, Medical Student 05/18/2018, 2:42 PM  Pager: [phone]  Attestation for Student Documentation:  I personally was present and performed or re-performed the history, physical exam and medical decision-making activities of this service and have verified that the service and findings are accurately documented in the student's note.  Ledell Noss, MD 05/19/2018, 3:29 AM

## 2018-05-18 NOTE — ED Notes (Signed)
Pt has pure wick in place and is connected to suction canister. Pt is aware that UA is needed.

## 2018-05-19 ENCOUNTER — Inpatient Hospital Stay (HOSPITAL_BASED_OUTPATIENT_CLINIC_OR_DEPARTMENT_OTHER): Payer: Medicare HMO

## 2018-05-19 DIAGNOSIS — I340 Nonrheumatic mitral (valve) insufficiency: Secondary | ICD-10-CM | POA: Diagnosis not present

## 2018-05-19 DIAGNOSIS — Z886 Allergy status to analgesic agent status: Secondary | ICD-10-CM

## 2018-05-19 DIAGNOSIS — I130 Hypertensive heart and chronic kidney disease with heart failure and stage 1 through stage 4 chronic kidney disease, or unspecified chronic kidney disease: Secondary | ICD-10-CM | POA: Diagnosis not present

## 2018-05-19 DIAGNOSIS — R0602 Shortness of breath: Secondary | ICD-10-CM | POA: Diagnosis not present

## 2018-05-19 DIAGNOSIS — I69354 Hemiplegia and hemiparesis following cerebral infarction affecting left non-dominant side: Secondary | ICD-10-CM

## 2018-05-19 DIAGNOSIS — I480 Paroxysmal atrial fibrillation: Secondary | ICD-10-CM | POA: Diagnosis not present

## 2018-05-19 DIAGNOSIS — R531 Weakness: Secondary | ICD-10-CM | POA: Diagnosis not present

## 2018-05-19 DIAGNOSIS — I110 Hypertensive heart disease with heart failure: Secondary | ICD-10-CM

## 2018-05-19 DIAGNOSIS — R079 Chest pain, unspecified: Secondary | ICD-10-CM | POA: Diagnosis not present

## 2018-05-19 DIAGNOSIS — R0789 Other chest pain: Secondary | ICD-10-CM

## 2018-05-19 DIAGNOSIS — R071 Chest pain on breathing: Secondary | ICD-10-CM

## 2018-05-19 DIAGNOSIS — Z79899 Other long term (current) drug therapy: Secondary | ICD-10-CM

## 2018-05-19 DIAGNOSIS — Z7901 Long term (current) use of anticoagulants: Secondary | ICD-10-CM

## 2018-05-19 DIAGNOSIS — Z950 Presence of cardiac pacemaker: Secondary | ICD-10-CM

## 2018-05-19 DIAGNOSIS — Z86718 Personal history of other venous thrombosis and embolism: Secondary | ICD-10-CM

## 2018-05-19 DIAGNOSIS — Z885 Allergy status to narcotic agent status: Secondary | ICD-10-CM

## 2018-05-19 DIAGNOSIS — Z888 Allergy status to other drugs, medicaments and biological substances status: Secondary | ICD-10-CM

## 2018-05-19 LAB — BASIC METABOLIC PANEL
Anion gap: 8 (ref 5–15)
BUN: 18 mg/dL (ref 8–23)
CO2: 25 mmol/L (ref 22–32)
Calcium: 9 mg/dL (ref 8.9–10.3)
Chloride: 108 mmol/L (ref 98–111)
Creatinine, Ser: 1.27 mg/dL — ABNORMAL HIGH (ref 0.44–1.00)
GFR calc Af Amer: 41 mL/min — ABNORMAL LOW (ref >60)
GFR calc non Af Amer: 36 mL/min — ABNORMAL LOW (ref >60)
Glucose, Bld: 90 mg/dL (ref 70–99)
Potassium: 4.2 mmol/L (ref 3.5–5.1)
SODIUM: 141 mmol/L (ref 135–145)

## 2018-05-19 LAB — NM MYOCAR MULTI W/SPECT W/WALL MOTION / EF
CSEPHR: 64 %
Estimated workload: 1 METS
Exercise duration (min): 5 min
Peak HR: 82 {beats}/min
Rest HR: 65 {beats}/min

## 2018-05-19 LAB — ECHOCARDIOGRAM COMPLETE
HEIGHTINCHES: 66 in
Weight: 2172.85 oz

## 2018-05-19 LAB — TROPONIN I

## 2018-05-19 LAB — TSH: TSH: 1.438 u[IU]/mL (ref 0.350–4.500)

## 2018-05-19 MED ORDER — REGADENOSON 0.4 MG/5ML IV SOLN
0.4000 mg | Freq: Once | INTRAVENOUS | Status: AC
  Administered 2018-05-19: 0.4 mg via INTRAVENOUS
  Filled 2018-05-19: qty 5

## 2018-05-19 MED ORDER — TECHNETIUM TC 99M TETROFOSMIN IV KIT
10.0000 | PACK | Freq: Once | INTRAVENOUS | Status: AC | PRN
  Administered 2018-05-19: 10 via INTRAVENOUS

## 2018-05-19 MED ORDER — TECHNETIUM TC 99M TETROFOSMIN IV KIT
30.0000 | PACK | Freq: Once | INTRAVENOUS | Status: AC | PRN
  Administered 2018-05-19: 30 via INTRAVENOUS

## 2018-05-19 MED ORDER — REGADENOSON 0.4 MG/5ML IV SOLN
INTRAVENOUS | Status: AC
  Administered 2018-05-19: 0.4 mg via INTRAVENOUS
  Filled 2018-05-19: qty 5

## 2018-05-19 MED ORDER — DICLOFENAC SODIUM 1 % TD GEL: 2.0000 g | Freq: Four times a day (QID) | TRANSDERMAL | 1 refills | Status: DC

## 2018-05-19 MED ORDER — AMLODIPINE BESYLATE 2.5 MG PO TABS
2.5000 mg | ORAL_TABLET | Freq: Every day | ORAL | 0 refills | Status: DC
Start: 2018-05-20 — End: 2018-09-06

## 2018-05-19 NOTE — Discharge Summary (Addendum)
Name: [name] [name] MRN: [id number] DOB: [birth year] 84 y.o. PCP: Olive Bass, MD  Date of Admission: 05/18/2018 11:02 AM Date of Discharge: 05/19/2018 Attending Physician: Burns Spain, MD  Discharge Diagnosis: 1. Atypical chest pain   Discharge Medications: Allergies as of 05/19/2018      Reactions   Crestor [rosuvastatin] Other (See Comments)   Myalgia   Darvon Other (See Comments)   Drop in  BP   Meloxicam Other (See Comments)   Dizziness    Meperidine Other (See Comments)   Hypotension   Prednisone Diarrhea   POLYURIA   Valsartan Other (See Comments)    Hypotension   Amiodarone Other (See Comments)   Did not tolerate - unknown reaction   Aspirin-acetaminophen-caffeine Other (See Comments)   Unknown reaction   Nitroglycerin    Profound hypotension with sublingual nitroglycerin.    Tramadol Other (See Comments)   Unknown reaction      Medication List    STOP taking these medications   furosemide 20 MG tablet Commonly known as:  LASIX     TAKE these medications   amLODipine 2.5 MG tablet Commonly known as:  NORVASC Take 1 tablet (2.5 mg total) by mouth daily. Start taking on:  05/20/2018   diclofenac sodium 1 % Gel Commonly known as:  VOLTAREN Apply 2 g topically 4 (four) times daily.   pravastatin 20 MG tablet Commonly known as:  PRAVACHOL Take 1 tablet (20 mg total) by mouth daily at 6 PM.   Sennosides 15 MG Chew Chew 1 tablet by mouth as needed (constipation).   vitamin B-12 1000 MCG tablet Commonly known as:  CYANOCOBALAMIN Take 1,000 mcg by mouth daily.   XARELTO 15 MG Tabs tablet Generic drug:  Rivaroxaban TAKE 1 TABLET (15 MG TOTAL) BY MOUTH DAILY. STROKE PREVENTION, BLOOD THINNER       Disposition and follow-up:   [name] [name] was discharged from [facility] in Good condition.  At the hospital follow up visit please address:  1.  Atypical chest pain - Check for the status of symptoms. Amlodipine 5 mg  daily.   2.  Labs / imaging needed at time of follow-up: none   3.  Pending labs/ test needing follow-up: none   Follow-up Appointments: Follow-up Information    Dough, Doris Cheadle, MD. Schedule an appointment as soon as possible for a visit in 1 week(s).   Specialty:  Family Medicine Contact information: [address] [place] [address] [phone]        Baldo Daub, MD .   Specialty:  Cardiology Contact information: [address] [place] [address] [phone]        [facility] Follow up.   Specialty:  Home Health Services Why:  They will continue to do your home health care at your home Contact information: [address] [place] [address] [phone]           Hospital Course by problem list: 1. Atypical chest pain  [name] [name] is a 84 year old female with a PMH of DVT on Xarelto, heart block s/p pacemaker insertion, CVA in 2017 with residual LE weakness, left leg paraesthesias, Chronic Diastolic HF, paroxysmal Afib, who presents with increased weakness, breathlessness and chest pressure that began on the morning of admission morning. She was given Nitro x3 with no improvement in her chest pain, troponin negative x3, EKG was not consistent with acute ischemia. Echocardiogram revealed EF  45-50% with hypokinesis of the inferoseptal walls. Stress test showed medium sized defect of mild severity of the basal inferior septal mid anteroseptal mid inferior septal and apical septal locations.  The defect was nonreversible and thought to be related to abnormal septal wall motion from ventricular pacing. There was no ischemia noted on stress test.  Well score for PE was low risk and she is recently on Xarelto for previous DVT.  She was started on low-dose for blood pressure and antianginal effects.  #Weakness  The patient stated that this weakness was consistent with symptoms she has been experiencing for at least a year. CT head  did not suggest an acute intracranial process and physical exam is only significant for defects that are attributed to the previous CVA in March of 2017. The patient was already taking vitamin B-12 supplementation, had a TSH within normal limits and did not have any electrolyte abnormalities evident on BMP that could contribute to increased weakness, fatigue or vague neurological symptoms. The cause of the patient's weakness is likely part of a chronic conditioning.  She will resume home health physical therapy at discharge.  #Hypertnesion: On the patient's last admission in 05/19, her antihypertensive medications were discontinued for orthostatic hypotension. However, on day one of admission, the patient's BP was elevated to the high 170s systolic and amlodipine was reinitiated per cardiology for antihypertensive and antianginal effects. She will need careful itration of this medication on outpatient follow up to manage risks of orthostatic hypotension vs risks of uncontrolled hypertension.    #History of DVT:  The patient has a history of DVT for which she is anticoagulated with Xarelto 15 mg. She was maintained on this medication during the current hospitalization.    Discharge Vitals:   BP (!) 174/71 (BP Location: Right Arm)   Pulse (!) 59   Temp 97.7 F (36.5 C) (Oral)   Resp 18   Ht 5\' 6"  (1.676 m)   Wt 135 lb 12.9 oz (61.6 kg)   SpO2 99%   BMI 21.92 kg/m   Pertinent Labs, Studies, and Procedures:   Echocardiogram 7/11  - Left ventricle: Poor acoustic windows limit study. LVEF is   approximateihy 45 to 50% with hypokinesis of the inferior,   inferoseptal walls The cavity size was normal. Wall thickness was   normal. - Mitral valve: There was mild regurgitation. - Pulmonary arteries: PA peak pressure: 33 mm Hg (S).  Myoview stress test   There was no ST segment deviation noted during stress.  No T wave inversion was noted during stress.  There is a medium defect of mild  severity present in the basal inferoseptal, mid anteroseptal, mid inferoseptal and apical septal location. The defect is non-reversible and likely related to abnormal septal wall motion from ventricular pacing. No ischemia noted.  There is a small defect of mild severity present in the apex location that is consistent with breast attenuation artifact.  Nuclear stress EF: 55% with paradoxical septal wall motion in the apical septum, mid infero and anterseptum secondary to venticular pacing.  This is a low risk study.  Discharge Instructions: Discharge Instructions    Call MD for:  persistant nausea and vomiting   Complete by:  As directed    Call MD for:  temperature >100.4   Complete by:  As directed    Diet - low sodium heart healthy   Complete by:  As directed    Discharge instructions   Complete by:  As directed  [name] [name], you were hospitalized for chest pain, shortness of breath, and generalized weakness.  We did test to make sure that your heart (a heart attack) was not the cause of your symptoms and the results of these test (echocardiogram and stress test) were not concerning for a heart attack.  Continue taking all of your home medications, please note these changes Start taking amlodipine 2.5 mg daily -this is for your blood pressure and can help with chest pain You can try using Voltaren gel for your muscle pain Schedule a follow-up to see your primary care doctor  Please call our clinic if you have any questions or concerns, we may be able to help and keep you from a long and expensive emergency room wait. Our clinic and after hours phone number is [phone], the best time to call is Monday through Friday 9 am to 4 pm but there is always someone available 24/7 if you have an emergency.   Increase activity slowly   Complete by:  As directed       Signed: Eulah Pont, MD 05/19/2018, 4:39 PM   Pager: [phone]

## 2018-05-19 NOTE — Plan of Care (Signed)
  Problem: Clinical Measurements: Goal: Will remain free from infection Outcome: Progressing Note:  No s/s of infection noted.   Problem: Coping: Goal: Level of anxiety will decrease Outcome: Progressing Note:  No s/s of anxiety noted.   Problem: Pain Managment: Goal: General experience of comfort will improve Outcome: Progressing Note:  Denies c/o pain or discomfort.   

## 2018-05-19 NOTE — Progress Notes (Addendum)
Subjective: Ms. Shrader endorses feeling a bit better today. She is tired from the round of testing in the morning. She still states that the chest pressure is present and exacerbated by activity. She still feels weak, but was able to walk with assistance to the bedside commode. She still endorses SOB, but also states that her breathing is "fine."   Objective:  Vital signs in last 24 hours: Vitals:   05/19/18 1015 05/19/18 1032 05/19/18 1036 05/19/18 1038  BP: (!) 178/83 (!) 185/73 (!) 166/69 (!) 157/71  Pulse: 64 80 84 78  Resp: 19 20 18 18   Temp:      TempSrc:      SpO2: 99%     Weight:      Height:       Weight change:   Intake/Output Summary (Last 24 hours) at 05/19/2018 1133 Last data filed at 05/19/2018 0500 Gross per 24 hour  Intake 315.95 ml  Output 400 ml  Net -84.05 ml   Physical Exam: General: No acute distress, sitting up in bed conversing clearly, alert elderly woman  Pulm: CAB, No increased work of breathing  CV: Irregular rhythm, nMRG appreciated  Abd: Soft, non-tender non-distended, BS+ Neuro: No focal defects, LLE and LUE 4/5 strength    Imaging:  Myocar Multi W/Spect W/Wall Motion/ EF:  There was no ST segment deviation noted during stress.  No T wave inversion was noted during stress.  There is a medium defect of mild severity present in the basal inferoseptal, mid anteroseptal, mid inferoseptal and apical septal location. The defect is non-reversible and likely related to abnormal septal wall motion from ventricular pacing. No ischemia noted.  There is a small defect of mild severity present in the apex location that is consistent with breast attenuation artifact.  Nuclear stress EF: 55% with paradoxical septal wall motion in the apical septum, mid infero and anterseptum secondary to venticular pacing.  This is a low risk study.  Assessment/Plan:  Active Problems:   Chest pain  #Chest pain- undetermined cause: Ms. Schmeling still endorses  chest pressure that worsens with activity and has not improved over the last day. However, she has received an ECG x2, troponin negative x3, and a nuclear medicine stress test that did not reveal any reversible or acute defects. It is not likely that an acute coronary syndrome is the cause of this chest pain. Given the reproducibility of the pain on palpation,  costochondritis or another Msk cause of chest pain is a possible explanation -Echocardiogram to assess EF and determine any new pathology since last Echo 01.2019 - she would like to continue using the voltaren gel, a prescription for this will be provided at discharge   #Shortness of breath: The patient has no increased work of breathing, clear lungs on exam and on chest xray and maintains O2 Sats >97%. It is therefore likely that she is near her baseline status and does not have an acute process ongoing. it is important to consider PE in a patient with a reported history of vascular events and with age as a significant risk factor. However, the patient's HR is <100, she has no evidence of DVT, is satting well on room air and has no recent long periods of immobilization. With this in mind, the patient's Well's score for PE is 1.5, putting her in a low risk group for this condition. Furthermore, she is already reveiving anticoagulation with Xarelto due to paroxysmal Afib.   #Weakness:  This seems to be  an acute on chronic problem. Originally, the patient endorsed a worsening of this symptom the morning of presentation to the hospital. However, today the patient says she feels better and although she still endorses weakness that limits he activity, she states that this is similar to her unsolved symptomatology for quite a while. The patient is afebrile with no symptoms that suggest infection and no white count. UA was negative with WBC count within normal range, making acute infectious etiology less likely. CT head does not suggest an acute intracranial  process and physical exam is only significant for defects taht are attributed to the previous CVA in March of 2017. The patient is already taking vitamin B-12 supplementation, had a TSH within normal limits during this admission and did not have any electrolyte abnormalities evident on BMP that could contribute to increased weakness, fatigue or vague neurological symptoms. The patient was recently taken off all her antihypertensives and lasix and on admission was only taking 2 mediations, making polypharmacy and medication-induced orthostatic hypotension an unlikely cause of this presentation. The weakness is likely an acute on chronic result of her lingering neurological defects and could be exacerbated by her perception of the other symptoms.  -resume home PT -encourage continued work with home health and emphasize relatively good mobility patient does have   #Hypertension:  On the patient's last hospital admission, she was taken off all her antihypertensive medications due to concern for orthostatic hypotension as a cause of syncope. However, on admission the patient's BP was >170. Amlodipine was restarted due both antihypertensive and anti-anginal properties. The patient's lungs sound clear to ascultation and chest x-ray does not suggest fluid overload, making pulmonary edema secondary to hypertensive crisis an unlikely explanation for the patient's constellation of symptoms. However,it is possible that uncontrolled hypertension could cause end organ damage, exemplified by the patient's rising Cr 0.99--> 1.27, and contribute to the clinical picture -Amlodipine 2.5 mg      #History of DVT: Given that Ms. Nagarajan is already anticoagulated and that she does not have signs like HR>100 or O2 Sat <94%, PE is not a likely cause of her symptoms. Furthermore, since she is already anticoagulated chronically, testing for this condition would not change management while exposing patient to contrast load.     -continue Xarelto 15mg  indefinitely   Dispo: likely today pending results of stress test and Echo     LOS: 1 day   Lindie Spruce, Medical Student 05/19/2018, 11:33 AM  Attestation for Student Documentation:  I personally was present and performed or re-performed the history, physical exam and medical decision-making activities of this service and have verified that the service and findings are accurately documented in the student's note.  Eulah Pont, MD 05/19/2018, 4:14 PM

## 2018-05-19 NOTE — Progress Notes (Signed)
  Echocardiogram 2D Echocardiogram has been performed.  Delcie Roch 05/19/2018, 3:03 PM

## 2018-05-19 NOTE — Progress Notes (Signed)
Reviewed AVS discharge instructions with patient/caregiver. Patient/caregiver verbalizes understanding of instructions received. AVS and prescriptions received by patient/caregiver. If present, telemetry box removed and central cardiac monitoring department notified of discharge. Peripheral IV removed, site benign with tip intact.  

## 2018-05-19 NOTE — Evaluation (Signed)
Physical Therapy Evaluation Patient Details Name: [name] [name] MRN: [id number] DOB: 1924-02-14 Today's Date: 05/19/2018   History of Present Illness  Pt is a 84 y.o. female admitted 05/18/18 with chest pain, and SOB. ECHO showed EF 45-50%; Myoview was low risk. PMH includes HTN, COPD, a-fib, CKD III, DVT, pacemaker, osteoporosis, CVA (residual L-side hemiparesis).    Clinical Impression  Pt presents with an overall decrease in functional mobility secondary to above. PTA, pt mod indep with ambulation using rollator; lives with husband. Has PCA stay nightly and 24/7 support available as needed. Today, pt ambulated with RW and intermittent min guard for balance. Son plans to stay with pt at d/c to provide assist; pt owns all necessary DME. Pt would benefit from continued acute PT services to maximize functional mobility and independence prior to d/c with continued HHPT services.     Follow Up Recommendations Home health PT;Supervision for mobility/OOB    Equipment Recommendations  None recommended by PT    Recommendations for Other Services       Precautions / Restrictions Precautions Precautions: Fall Restrictions Weight Bearing Restrictions: No      Mobility  Bed Mobility Overal bed mobility: Modified Independent             General bed mobility comments: Increased time and effort  Transfers Overall transfer level: Needs assistance   Transfers: Sit to/from Stand Sit to Stand: Min guard         General transfer comment: Stood from bed and toilet with RW and min guard for balance  Ambulation/Gait Ambulation/Gait assistance: Min guard Gait Distance (Feet): 20 Feet Assistive device: Rolling walker (2 wheeled) Gait Pattern/deviations: Step-through pattern;Decreased stride length;Trunk flexed Gait velocity: Decreased Gait velocity interpretation: 1.31 - 2.62 ft/sec, indicative of limited community ambulator General Gait Details: Slow, steady amb with RW and  intermittent min guard for balance  Stairs            Wheelchair Mobility    Modified Rankin (Stroke Patients Only)       Balance Overall balance assessment: Mild deficits observed, not formally tested                                           Pertinent Vitals/Pain Pain Assessment: No/denies pain    Home Living Family/patient expects to be discharged to:: Private residence Living Arrangements: Spouse/significant other Available Help at Discharge: Family;Available 24 hours/day;Personal care attendant Type of Home: House Home Access: Stairs to enter Entrance Stairs-Rails: Right Entrance Stairs-Number of Steps: 2 Home Layout: Two level;Able to live on main level with bedroom/bathroom Home Equipment: Dan Humphreys - 2 wheels;Walker - 4 wheels;Bedside commode;Cane - single point;Shower seat;Grab bars - tub/shower Additional Comments: Pt has PCA who spends the night to supervise husband who has PD. Will have initial 24/7 support from son. Very supportive family/community    Prior Function Level of Independence: Independent with assistive device(s)         Comments: Uses rollator for ambulation. Reports indep with ADLs and most IADLs (has assist for "heavy lifting")     Hand Dominance        Extremity/Trunk Assessment   Upper Extremity Assessment Upper Extremity Assessment: Overall WFL for tasks assessed    Lower Extremity Assessment Lower Extremity Assessment: Generalized weakness(L residual weakness from CVA)       Communication   Communication: No difficulties  Cognition Arousal/Alertness: Awake/alert Behavior  During Therapy: WFL for tasks assessed/performed Overall Cognitive Status: Within Functional Limits for tasks assessed                                        General Comments      Exercises     Assessment/Plan    PT Assessment Patient needs continued PT services  PT Problem List Decreased strength;Decreased  activity tolerance;Decreased balance;Decreased mobility       PT Treatment Interventions DME instruction;Gait training;Stair training;Functional mobility training;Therapeutic activities;Therapeutic exercise;Balance training;Patient/family education    PT Goals (Current goals can be found in the Care Plan section)  Acute Rehab PT Goals Patient Stated Goal: Return home PT Goal Formulation: With patient Time For Goal Achievement: 06/02/18 Potential to Achieve Goals: Good    Frequency Min 3X/week   Barriers to discharge        Co-evaluation               AM-PAC PT "6 Clicks" Daily Activity  Outcome Measure Difficulty turning over in bed (including adjusting bedclothes, sheets and blankets)?: None Difficulty moving from lying on back to sitting on the side of the bed? : None Difficulty sitting down on and standing up from a chair with arms (e.g., wheelchair, bedside commode, etc,.)?: A Little Help needed moving to and from a bed to chair (including a wheelchair)?: A Little Help needed walking in hospital room?: A Little Help needed climbing 3-5 steps with a railing? : A Little 6 Click Score: 20    End of Session Equipment Utilized During Treatment: Gait belt Activity Tolerance: Patient tolerated treatment well Patient left: with call bell/phone within reach;with bed alarm set Nurse Communication: Mobility status PT Visit Diagnosis: Other abnormalities of gait and mobility (R26.89)    Time: 1610-9604 PT Time Calculation (min) (ACUTE ONLY): 12 min   Charges:   PT Evaluation $PT Eval Moderate Complexity: 1 Mod     PT G Codes:       Ina Homes, PT, DPT Acute Rehab Services  Pager: [phone]  Malachy Chamber 05/19/2018, 5:16 PM

## 2018-05-19 NOTE — Care Management CC44 (Signed)
Condition Code 44 Documentation Completed  Patient Details  Name: [name] [name] MRN: [id number] Date of Birth: [birth year]   Condition Code 44 given:  Yes Patient signature on Condition Code 44 notice:  Yes Documentation of 2 MD's agreement:  Yes Code 44 added to claim:  Yes    Cherrie Distance, RN 05/19/2018, 3:04 PM

## 2018-05-19 NOTE — Progress Notes (Addendum)
Progress Note  Patient Name: [name] [name] Date of Encounter: 05/19/2018  Primary Cardiologist: Norman Herrlich, MD  Subjective   Feeling well this morning. No chest pain.   Inpatient Medications    Scheduled Meds: . amLODipine  2.5 mg Oral Daily  . diclofenac sodium  2 g Topical QID  . pravastatin  20 mg Oral q1800  . rivaroxaban  15 mg Oral Daily   Continuous Infusions: . sodium chloride 100 mL/hr at 05/18/18 2016   PRN Meds: acetaminophen **OR** acetaminophen   Vital Signs    Vitals:   05/19/18 1015 05/19/18 1032 05/19/18 1036 05/19/18 1038  BP: (!) 178/83 (!) 185/73 (!) 166/69 (!) 157/71  Pulse: 64 80 84 78  Resp: 19 20 18 18   Temp:      TempSrc:      SpO2: 99%     Weight:      Height:        Intake/Output Summary (Last 24 hours) at 05/19/2018 1040 Last data filed at 05/19/2018 0500 Gross per 24 hour  Intake 315.95 ml  Output 400 ml  Net -84.05 ml   Filed Weights   05/18/18 1117 05/19/18 0530  Weight: 135 lb (61.2 kg) 135 lb 12.9 oz (61.6 kg)    ECG    SR with AV demand pacing - Personally Reviewed  Physical Exam   General: Well developed, well nourished, older W female appearing in no acute distress. Head: Normocephalic, atraumatic.  Neck: Supple without bruits, JVD. Lungs:  Resp regular and unlabored, CTA. Heart: RRR, S1, S2, soft systolic murmur; no rub. Abdomen: Soft, non-tender, non-distended with normoactive bowel sounds.  Extremities: No clubbing, cyanosis, edema. Distal pedal pulses are 2+ bilaterally. Neuro: Alert and oriented X 3. Moves all extremities spontaneously. Psych: Normal affect.  Labs    Chemistry Recent Labs  Lab 05/18/18 1119 05/18/18 1146 05/19/18 0005  NA 140 141 141  K 4.4 4.4 4.2  CL 108 108 108  CO2 25  --  25  GLUCOSE 93 88 90  BUN 19 20 18   CREATININE 1.29* 1.20* 1.27*  CALCIUM 9.8  --  9.0  PROT 6.8  --   --   ALBUMIN 4.1  --   --   AST 22  --   --   ALT 15  --   --   ALKPHOS 47  --   --   BILITOT  0.8  --   --   GFRNONAA 35*  --  36*  GFRAA 40*  --  41*  ANIONGAP 7  --  8     Hematology Recent Labs  Lab 05/18/18 1119 05/18/18 1146  WBC 6.1  --   RBC 4.56  --   HGB 13.6 13.9  HCT 43.0 41.0  MCV 94.3  --   MCH 29.8  --   MCHC 31.6  --   RDW 13.4  --   PLT 164  --     Cardiac Enzymes Recent Labs  Lab 05/18/18 1649 05/18/18 2338  TROPONINI <0.03 <0.03    Recent Labs  Lab 05/18/18 1144  TROPIPOC 0.00     BNPNo results for input(s): BNP, PROBNP in the last 168 hours.   DDimer No results for input(s): DDIMER in the last 168 hours.    Radiology    Dg Chest 2 View  Result Date: 05/18/2018 CLINICAL DATA:  Shortness of breath and chest pain for the last few days EXAM: CHEST - 2 VIEW COMPARISON:  Chest imaging  dated 01/18/2018. FINDINGS: Dual lead pacer noted. Atherosclerotic calcification of the aortic arch. Atelectasis or scarring along the right lateral costophrenic angle. Mild enlargement of the cardiopericardial silhouette. Minimal scarring along the left hemidiaphragm. Scarring or atelectasis in the lingula. IMPRESSION: 1. Mild enlargement of the cardiopericardial silhouette, without edema. 2. Scarring or atelectasis along the right lateral costophrenic angle and in the lingula. 3.  Aortic Atherosclerosis (ICD10-I70.0). Electronically Signed   By: Gaylyn Rong M.D.   On: 05/18/2018 14:15   Ct Head Wo Contrast  Result Date: 05/18/2018 CLINICAL DATA:  Acute onset of pressure sensation in the head. Previous stroke. Left-sided weakness. EXAM: CT HEAD WITHOUT CONTRAST TECHNIQUE: Contiguous axial images were obtained from the base of the skull through the vertex without intravenous contrast. COMPARISON:  03/16/2018 FINDINGS: Brain: Generalized atrophy. Prominence of the subarachnoid spaces which is a chronic finding. Old infarction in the left occipital lobe. Chronic small-vessel changes of the hemispheric white matter. No sign of acute infarction, mass lesion,  hemorrhage, hydrocephalus or subdural collection. Vascular: There is atherosclerotic calcification of the major vessels at the base of the brain. Skull: Negative Sinuses/Orbits: Clear/normal Other: None IMPRESSION: No acute or reversible finding. Chronic brain atrophy with prominence of the subarachnoid spaces. Old infarction left occipital lobe. Chronic small-vessel changes of the hemispheric white matter. Electronically Signed   By: Paulina Fusi M.D.   On: 05/18/2018 12:08   Cardiac Studies   TTE: pending  Patient Profile     84 y.o. female with a PMH of CAD (?MI 1970s, non-obstructive LHC 2011), HTN, HLD, paroxysmal atrial fibrillation on xarelto, chronic diastolic CHF, CHB s/p PPM, CVA x2, DVT, who was seen for the evaluation of chest pain.   Assessment & Plan    1. Chest pain in patient with non-obstructive CAD: patient reported intermittent chest pressure for "a while" which occurs both with exertion and at rest. She reports associated SOB and lightheadedness.  - trops negative - echo pending - seen in nuc med, images pending - started on low dose amlodipine for both HTN and antianginal effects  2. HTN: BP elevated. Antihypertensives discontinued in May given syncopal episodes thought to be 2/2 orthostatic hypotension. Last syncope occurred one month ago.  - started on low dose amlodipine, may need further titration of meds  3. Chronic diastolic CHF: Last echo 11/2017 with EF 50-55%, G1DD. She reported stable daily weights and has not needed prn lasix. She appears euvolemic on exam and CXR is without pulmonary edema.   4. Paroxysmal atrial fibrillation: SR with AV pacing while in nuc  - Continue xarelto for CHA2DS2-VASc Score of 8 (CHF, HTN, Vasc, Female, Age >67, stroke)   5. HLD: LDL 100 03/2018 - Continue statin  Signed, Laverda Page, NP  05/19/2018, 10:40 AM  Pager # [phone]   For questions or updates, please contact [facility] Please consult [email or web address] for  contact info under Cardiology/STEMI.   Patient seen, evaluated. Available data reviewed. Agree with findings, assessment, and plan as outlined by Laverda Page, NP-C.   She is an elderly woman in no distress.  On her way down to echo.  The patient's nuclear scan is reviewed with findings outlined below:   There was no ST segment deviation noted during stress.  No T wave inversion was noted during stress.  There is a medium defect of mild severity present in the basal inferoseptal, mid anteroseptal, mid inferoseptal and apical septal location. The defect is non-reversible and likely related to abnormal septal wall motion  from ventricular pacing. No ischemia noted.  There is a small defect of mild severity present in the apex location that is consistent with breast attenuation artifact.  Nuclear stress EF: 55% with paradoxical septal wall motion in the apical septum, mid infero and anterseptum secondary to venticular pacing.  This is a low risk study.    Her echocardiogram is still pending.  I do not anticipate any further cardiac work-up at this time.  I suspect her chest pain is noncardiac.  [facility] will sign off.   Medication Recommendations:  No changes Other recommendations (labs, testing, etc):  None pending review of echo result Follow up as an outpatient:  Routinely with Dr Barron Alvine, M.D. 05/19/2018 1:30 PM

## 2018-05-19 NOTE — Progress Notes (Signed)
Attempted to give Code 44 to patient, she is not in the room at this time, CM to follow up; B [place] RN,MHA,BSN [phone]
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
## 2018-05-19 NOTE — Evaluation (Signed)
Occupational Therapy Evaluation and Discharge from OT Patient Details Name: [name] [name] MRN: [id number] DOB: November 10, 1923 Today's Date: 05/19/2018    History of Present Illness Pt is a 84 y.o. female admitted 05/18/18 with chest pain, and SOB. ECHO showed EF 45-50%; Myoview was low risk. PMH includes HTN, COPD, a-fib, CKD III, DVT, pacemaker, osteoporosis, CVA (residual L-side hemiparesis).   Clinical Impression   PTA pt mod I for ADL with DME, and Mod I with RW for mobility. Pt is at supervision for ADL at this time, she did require increased assist with LB dressing suspect "sticky socks" and difference in undergarments. Please see ADL section below. Pt with no questions or concerns at the end of session, she will have 24 hour supervision from son at discharge. Thank you for the opportunity to serve this patient.     Follow Up Recommendations  Supervision/Assistance - 24 hour(initially)    Equipment Recommendations  None recommended by OT(Pt has appropriate DME)    Recommendations for Other Services       Precautions / Restrictions Precautions Precautions: Fall Restrictions Weight Bearing Restrictions: No      Mobility Bed Mobility Overal bed mobility: Modified Independent             General bed mobility comments: Increased time and effort  Transfers Overall transfer level: Needs assistance Equipment used: Rolling walker (2 wheeled) Transfers: Sit to/from Stand Sit to Stand: Min guard         General transfer comment: Stood from bed and toilet with RW and min guard for balance    Balance Overall balance assessment: Mild deficits observed, not formally tested                                         ADL either performed or assessed with clinical judgement   ADL Overall ADL's : At baseline                                       General ADL Comments: Pt able to perform toilet transfer, min A to assist with mesh undies and pad  with "sticky socks" Pt able to perform peri care seated with lateral leans, min guard for safety with sink level grooming     Vision Baseline Vision/History: Wears glasses Patient Visual Report: No change from baseline       Perception     Praxis      Pertinent Vitals/Pain Pain Assessment: No/denies pain     Hand Dominance Right   Extremity/Trunk Assessment Upper Extremity Assessment Upper Extremity Assessment: Overall WFL for tasks assessed   Lower Extremity Assessment Lower Extremity Assessment: Defer to PT evaluation       Communication Communication Communication: No difficulties   Cognition Arousal/Alertness: Awake/alert Behavior During Therapy: WFL for tasks assessed/performed Overall Cognitive Status: Within Functional Limits for tasks assessed                                 General Comments: good safety awareness   General Comments       Exercises     Shoulder Instructions      Home Living Family/patient expects to be discharged to:: Private residence Living Arrangements: Spouse/significant other Available Help at Discharge: Family;Available 24  hours/day;Personal care attendant Type of Home: House Home Access: Stairs to enter [employer] of Steps: 2 Entrance Stairs-Rails: Right Home Layout: Two level;Able to live on main level with bedroom/bathroom     Bathroom Shower/Tub: Producer, television/film/video: Handicapped height Bathroom Accessibility: Yes   Home Equipment: Environmental consultant - 2 wheels;Walker - 4 wheels;Bedside commode;Cane - single point;Shower seat;Grab bars - tub/shower   Additional Comments: Pt has PCA who spends the night to supervise husband who has PD. Will have initial 24/7 support from son. Very supportive family/community      Prior Functioning/Environment Level of Independence: Independent with assistive device(s)        Comments: Uses rollator for ambulation. Reports indep with ADLs and most IADLs  (has assist for "heavy lifting")        OT Problem List:        OT Treatment/Interventions:      OT Goals(Current goals can be found in the care plan section) Acute Rehab OT Goals Patient Stated Goal: Return home OT Goal Formulation: With patient Time For Goal Achievement: 06/02/18 Potential to Achieve Goals: Good  OT Frequency:     Barriers to D/C:            Co-evaluation              AM-PAC PT "6 Clicks" Daily Activity     Outcome Measure Help from another person eating meals?: None Help from another person taking care of personal grooming?: None Help from another person toileting, which includes using toliet, bedpan, or urinal?: None Help from another person bathing (including washing, rinsing, drying)?: A Little Help from another person to put on and taking off regular upper body clothing?: None Help from another person to put on and taking off regular lower body clothing?: A Little 6 Click Score: 22   End of Session Equipment Utilized During Treatment: Gait belt;Rolling walker Nurse Communication: Mobility status  Activity Tolerance: Patient tolerated treatment well Patient left: in bed;with call bell/phone within reach;with bed alarm set                   Time: 9604-5409 OT Time Calculation (min): 11 min Charges:  OT General Charges $OT Visit: 1 Visit OT Evaluation $OT Eval Low Complexity: 1 Low G-Codes:     Sherryl Manges OTR/L [phone]   Evern Bio Jenee Spaugh 05/19/2018, 5:32 PM

## 2018-05-19 NOTE — Care Management Obs Status (Signed)
MEDICARE OBSERVATION STATUS NOTIFICATION   Patient Details  Name: [name] [name] MRN: [id number] Date of Birth: [birth year]   Medicare Observation Status Notification Given:  Yes    Cherrie Distance, RN 05/19/2018, 3:04 PM

## 2018-05-19 NOTE — Progress Notes (Signed)
Patient is active with [facility] as prior to admission for [facility] and HHPT; CM called HHC agency, spoke to [place]; they are aware of discharge home today; Alexis Goodell [phone]

## 2018-05-19 NOTE — Progress Notes (Signed)
PT Cancellation Note  Patient Details Name: [name] [name] MRN: [id number] DOB: 1923-12-13   Cancelled Treatment:    Reason Eval/Treat Not Completed: Patient at procedure or test/unavailable. Will follow-up for PT evaluation as schedule permits.  Ina Homes, PT, DPT Acute Rehab Services  Pager: [phone]  Malachy Chamber 05/19/2018, 9:36 AM

## 2018-06-14 ENCOUNTER — Other Ambulatory Visit: Payer: Self-pay | Admitting: Internal Medicine

## 2018-06-23 ENCOUNTER — Encounter: Payer: Self-pay | Admitting: Cardiology

## 2018-06-27 ENCOUNTER — Telehealth: Payer: Self-pay

## 2018-06-27 NOTE — Telephone Encounter (Signed)
Pt called about the letter she received. Pt tried to send a transmission while I was on the phone but it was unsuccessful. I gave her the number to [facility] tech support.

## 2018-07-01 ENCOUNTER — Other Ambulatory Visit: Payer: Self-pay | Admitting: Internal Medicine

## 2018-07-03 ENCOUNTER — Other Ambulatory Visit: Payer: Self-pay | Admitting: Internal Medicine

## 2018-07-04 ENCOUNTER — Ambulatory Visit (INDEPENDENT_AMBULATORY_CARE_PROVIDER_SITE_OTHER): Payer: Medicare HMO | Admitting: *Deleted

## 2018-07-04 DIAGNOSIS — I442 Atrioventricular block, complete: Secondary | ICD-10-CM | POA: Diagnosis not present

## 2018-07-04 NOTE — Progress Notes (Signed)
Remote pacemaker transmission.   

## 2018-07-05 ENCOUNTER — Encounter: Payer: Self-pay | Admitting: Cardiology

## 2018-07-15 LAB — CUP PACEART REMOTE DEVICE CHECK
Battery Impedance: 204 Ohm
Battery Remaining Longevity: 109 mo
Battery Voltage: 2.79 V
Brady Statistic AP VS Percent: 0 %
Brady Statistic AS VP Percent: 47 %
Date Time Interrogation Session: 20190826180739
Implantable Lead Implant Date: 20160705
Implantable Lead Location: 753859
Implantable Lead Location: 753860
Implantable Lead Model: 5076
Lead Channel Pacing Threshold Pulse Width: 0.4 ms
Lead Channel Pacing Threshold Pulse Width: 0.4 ms
Lead Channel Setting Pacing Amplitude: 2 V
Lead Channel Setting Pacing Amplitude: 2.5 V
Lead Channel Setting Pacing Pulse Width: 0.4 ms
Lead Channel Setting Sensing Sensitivity: 4 mV
MDC IDC LEAD IMPLANT DT: 20160705
MDC IDC MSMT LEADCHNL RA IMPEDANCE VALUE: 500 Ohm
MDC IDC MSMT LEADCHNL RA PACING THRESHOLD AMPLITUDE: 0.5 V
MDC IDC MSMT LEADCHNL RV IMPEDANCE VALUE: 531 Ohm
MDC IDC MSMT LEADCHNL RV PACING THRESHOLD AMPLITUDE: 0.5 V
MDC IDC PG IMPLANT DT: 20160705
MDC IDC STAT BRADY AP VP PERCENT: 52 %
MDC IDC STAT BRADY AS VS PERCENT: 0 %

## 2018-07-18 DIAGNOSIS — I6789 Other cerebrovascular disease: Secondary | ICD-10-CM

## 2018-07-18 DIAGNOSIS — R531 Weakness: Secondary | ICD-10-CM

## 2018-07-18 DIAGNOSIS — N3000 Acute cystitis without hematuria: Secondary | ICD-10-CM

## 2018-07-18 DIAGNOSIS — I5032 Chronic diastolic (congestive) heart failure: Secondary | ICD-10-CM

## 2018-07-18 DIAGNOSIS — I2510 Atherosclerotic heart disease of native coronary artery without angina pectoris: Secondary | ICD-10-CM

## 2018-07-18 DIAGNOSIS — Z86711 Personal history of pulmonary embolism: Secondary | ICD-10-CM

## 2018-07-19 DIAGNOSIS — I2510 Atherosclerotic heart disease of native coronary artery without angina pectoris: Secondary | ICD-10-CM | POA: Diagnosis not present

## 2018-07-19 DIAGNOSIS — R531 Weakness: Secondary | ICD-10-CM | POA: Diagnosis not present

## 2018-07-19 DIAGNOSIS — I5032 Chronic diastolic (congestive) heart failure: Secondary | ICD-10-CM | POA: Diagnosis not present

## 2018-07-19 DIAGNOSIS — N3000 Acute cystitis without hematuria: Secondary | ICD-10-CM | POA: Diagnosis not present

## 2018-07-20 NOTE — Progress Notes (Deleted)
Cardiology Office Note:    Date:  07/20/2018   ID:  [name] [name], DOB 1924-08-25, MRN [id number]  PCP:  Algis Greenhouse, MD  Cardiologist:  Shirlee More, MD    Referring MD: Algis Greenhouse, MD    ASSESSMENT:    No diagnosis found. PLAN:    In order of problems listed above:  1. ***   Next appointment: ***   Medication Adjustments/Labs and Tests Ordered: Current medicines are reviewed at length with the patient today.  Concerns regarding medicines are outlined above.  No orders of the defined types were placed in this encounter.  No orders of the defined types were placed in this encounter.   No chief complaint on file.   History of Present Illness:    [name] [name] is a 84 y.o. female with a hx of paroxysmal atrial fibrillation, DVT 11/2017 on chronic anticoagulation with low dose xarelto, complete heart block s/p PPM in place (05/2015), hypertensive heart disease without heart failure, orthostatic hypotension, CAD (qeustionable MI in 1975, nonobstuctive cath in 2011), hx of CVA x 2 and chronic diastolic heart failure l   last seen 04/08/18 she was admitted to [facility] 05/18/2018 for 1 day for chest pain.  Serial cardiac enzymes remain normal EKG did not show ischemic changes echocardiogram showed EF of 45 to 50% with segmental left ventricular dysfunction and she underwent myocardial perfusion study which showed a fixed defect and no ischemia.. Compliance with diet, lifestyle and medications: *** Past Medical History:  Diagnosis Date  . Acute deep vein thrombosis (DVT) of right peroneal vein ([place]) 11/30/2017   Overview:  4259: uncomplicated, xarelto  . AKI (acute kidney injury) ([place]) 01/01/2018  . Arthritis   . Atrial fibrillation ([place]) 02/08/2016  . Benign essential HTN 01/15/2016  . Bradycardia with 31 - 40 beats per minute 05/14/2015  . Bronchiectasis, non-tuberculous ([place])    a. Followed by Dr. Joya Gaskins.  . Cerebrovascular accident (CVA) due to thrombosis of  right middle cerebral artery ([place]) 02/07/2016   Overview:  2017: R-MCA thrombosis with left non-dom hemiparesis, thalamic capsule ischemia 2018: recurrent  . Chest pain with low risk for cardiac etiology 11/18/2015  . Chronic kidney disease, stage III (moderate) ([place]) 01/15/2016  . Chronic pain of left knee 12/07/2016  . Closed compression fracture of fourth lumbar vertebra ([place]) 01/19/2017   Overview:  2015  . Closed fracture of ramus of left pubis ([place]) 06/18/2017   Overview:  2018  . Colon polyps   . Complete atrioventricular block ([place]) 01/15/2016   Overview:  2016: PM  . Complete heart block ([place])    a. s/p MDT dual chamber PPM   . COPD (chronic obstructive pulmonary disease) ([place])    past hx with pneumonia  . Coronary artery disease prior MI   . Diverticulosis   . DVT (deep venous thrombosis) ([facility])    Resolved, no anticoagulation currently  . Dyspnea on exertion 10/12/2017   Overview:  2018  . Edema -lower extremity 06/22/2013  . Fatigue 08/19/2012  . GERD (gastroesophageal reflux disease)   . Hammer toe of left foot 06/07/2017  . Headache   . Heart block   . Hemiparesis of left nondominant side as late effect of cerebral infarction ([place]) 12/22/2016  . High cholesterol   . History of pulmonary embolism 08/30/2012   PE 2013  >>repeat CT Angio chest 12/2013>>>NO PE   . Hypertension   . IBS (irritable bowel syndrome)   . Left foot pain  06/07/2017  . Left leg pain 08/03/2016   Overview:  2017: related to weakness left leg from stroke  . Lumbar radiculopathy 07/27/2017  . 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 Malaise and fatigue 10/27/2017  . MI (myocardial infarction) ?? probably not 1975   a. 1975 Reported MI;  b. 2011 Cath: nonobs dzs;  c. 2013 Nonischemic Myoview;  d. 12/2012 Echo: EF 60-65%, Gr1 DD.  . Nephrolithiasis   . Orthostatic syncope 01/01/2018  . Osteoarthritis of right knee 12/12/2012  . Osteoporosis 08/01/2016  . Pain in limb 08/16/2013  . Pain of left  femur 06/17/2017   Overview:  2018  . Palpitation    a. 2010 Event Monitor: PACs and PVCs  . Panlobular emphysema ([place]) 01/15/2016  . Paroxysmal supraventricular tachycardia ([place]) 01/15/2016  . Pneumonia   . PONV (postoperative nausea and vomiting)   . Post-dural puncture headache 09/01/2017  . Presence of cardiac pacemaker 01/16/2016   July 5,2016 Overview:  2016: complete AVB  . Presence of permanent cardiac pacemaker   . Pulmonary fibrosis ([place])   . S/P left knee arthroscopy 04/01/2017  . S/P TKR (total knee replacement) using cement, right 12/07/2016  . Stroke ([place])   . Varicose veins of lower extremities with other complications 94/11/7406  . Vitamin D deficiency 08/01/2016  . Weakness generalized 11/25/2017  . Wears dentures    full  . Wide-complex tachycardia ([place])    a. Followed by Dr. Caryl Comes, "I think this is VT, but i am not sure QRSd 135 1:1 AV."    Past Surgical History:  Procedure Laterality Date  . APPENDECTOMY    . BLADDER SUSPENSION     tack  . CARDIAC CATHETERIZATION Right 05/14/2015   Procedure: Temporary Pacemaker;  Surgeon: Thompson Grayer, MD;  Location: [place] [facility];  Service: Cardiovascular;  Laterality: Right;  . CHOLECYSTECTOMY    . COLON SURGERY     12inches colectomy s/p diverticulitis  . COLONOSCOPY W/ BIOPSIES AND POLYPECTOMY    . EP IMPLANTABLE DEVICE N/A 05/14/2015   Procedure: Pacemaker Implant;  Surgeon: Thompson Grayer, MD; [place] 1 (serial number [id number])     . IR FL GUIDED LOC OF NEEDLE/CATH TIP FOR SPINAL INJECTION LT  09/06/2017  . KNEE ARTHROSCOPY Left 03/29/2017   Procedure: ARTHROSCOPY LEFT KNEE WITH PARTIAL MEDIAL AND PARTIAL LATERAL MENISCECTOMY;  Surgeon: Vickey Huger, MD;  Location: [facility];  Service: Orthopedics;  Laterality: Left;  Marland Kitchen MULTIPLE TOOTH EXTRACTIONS    . TONSILLECTOMY    . TOTAL ABDOMINAL HYSTERECTOMY    . TOTAL KNEE ARTHROPLASTY  12/12/2012   Procedure: TOTAL KNEE ARTHROPLASTY;  Surgeon: Alta Corning, MD;   Location: [facility];  Service: Orthopedics;  Laterality: Right;  right total knee arthroplasty    Current Medications: No outpatient medications have been marked as taking for the 07/21/18 encounter (Appointment) with Richardo Priest, MD.     Allergies:   Crestor [rosuvastatin]; Darvon; Meloxicam; Meperidine; Prednisone; Valsartan; Amiodarone; Aspirin-acetaminophen-caffeine; Nitroglycerin; and Tramadol   Social History   Socioeconomic History  . Marital status: Married    Spouse name: Not on file  . Number of children: 3  . Years of education: Not on file  . Highest education level: Not on file  Occupational History  . Occupation: Retired    Comment: Musician  Social Needs  . Financial resource strain: Not on file  . Food insecurity:    Worry: Not on file    Inability:  Not on file  . Transportation needs:    Medical: Not on file    Non-medical: Not on file  Tobacco Use  . Smoking status: Never Smoker  . Smokeless tobacco: Never Used  Substance and Sexual Activity  . Alcohol use: No  . Drug use: No  . Sexual activity: Not on file  Lifestyle  . Physical activity:    Days per week: Not on file    Minutes per session: Not on file  . Stress: Not on file  Relationships  . Social connections:    Talks on phone: Not on file    Gets together: Not on file    Attends religious service: Not on file    Active member of club or organization: Not on file    Attends meetings of clubs or organizations: Not on file    Relationship status: Not on file  Other Topics Concern  . Not on file  Social History Narrative  . Not on file     Family History: The patient's ***family history includes Heart attack in her mother; Heart disease in her father; Other in her son; Stomach cancer in her brother. ROS:   Please see the history of present illness.    All other systems reviewed and are negative.  EKGs/Labs/Other Studies Reviewed:    The following studies were reviewed  today:  EKG:  EKG ordered today.  The ekg ordered today demonstrates ***  Recent Labs: 01/18/2018: B Natriuretic Peptide 40.5 05/18/2018: ALT 15; Hemoglobin 13.9; Platelets 164; TSH 1.438 05/19/2018: BUN 18; Creatinine, Ser 1.27; Potassium 4.2; Sodium 141  Recent Lipid Panel    Component Value Date/Time   CHOL 166 03/16/2018 0611   TRIG 86 03/16/2018 0611   HDL 49 03/16/2018 0611   CHOLHDL 3.4 03/16/2018 0611   VLDL 17 03/16/2018 0611   LDLCALC 100 (H) 03/16/2018 2956    Physical Exam:    VS:  There were no vitals taken for this visit.    Wt Readings from Last 3 Encounters:  05/19/18 135 lb 12.9 oz (61.6 kg)  04/08/18 137 lb (62.1 kg)  03/17/18 151 lb 10.8 oz (68.8 kg)     GEN: *** Well nourished, well developed in no acute distress HEENT: Normal NECK: No JVD; No carotid bruits LYMPHATICS: No lymphadenopathy CARDIAC: ***RRR, no murmurs, rubs, gallops RESPIRATORY:  Clear to auscultation without rales, wheezing or rhonchi  ABDOMEN: Soft, non-tender, non-distended MUSCULOSKELETAL:  No edema; No deformity  SKIN: Warm and dry NEUROLOGIC:  Alert and oriented x 3 PSYCHIATRIC:  Normal affect    Signed, Shirlee More, MD  07/20/2018 3:34 PM    [facility]

## 2018-07-21 ENCOUNTER — Ambulatory Visit: Payer: Medicare HMO | Admitting: Cardiology

## 2018-09-03 ENCOUNTER — Encounter (HOSPITAL_COMMUNITY): Payer: Self-pay

## 2018-09-03 ENCOUNTER — Inpatient Hospital Stay (HOSPITAL_COMMUNITY)
Admission: EM | Admit: 2018-09-03 | Discharge: 2018-09-06 | DRG: 101 | Disposition: A | Discharge status: Home Health | Payer: Medicare Other | Attending: Family Medicine | Admitting: Family Medicine

## 2018-09-03 ENCOUNTER — Emergency Department (HOSPITAL_COMMUNITY): Payer: Medicare Other

## 2018-09-03 DIAGNOSIS — I130 Hypertensive heart and chronic kidney disease with heart failure and stage 1 through stage 4 chronic kidney disease, or unspecified chronic kidney disease: Secondary | ICD-10-CM | POA: Diagnosis present

## 2018-09-03 DIAGNOSIS — I429 Cardiomyopathy, unspecified: Secondary | ICD-10-CM | POA: Diagnosis present

## 2018-09-03 DIAGNOSIS — I5042 Chronic combined systolic (congestive) and diastolic (congestive) heart failure: Secondary | ICD-10-CM | POA: Diagnosis present

## 2018-09-03 DIAGNOSIS — Z96651 Presence of right artificial knee joint: Secondary | ICD-10-CM | POA: Diagnosis not present

## 2018-09-03 DIAGNOSIS — J449 Chronic obstructive pulmonary disease, unspecified: Secondary | ICD-10-CM | POA: Diagnosis not present

## 2018-09-03 DIAGNOSIS — S066X9D Traumatic subarachnoid hemorrhage with loss of consciousness of unspecified duration, subsequent encounter: Secondary | ICD-10-CM

## 2018-09-03 DIAGNOSIS — N183 Chronic kidney disease, stage 3 (moderate): Secondary | ICD-10-CM | POA: Diagnosis not present

## 2018-09-03 DIAGNOSIS — G9608 Other cranial cerebrospinal fluid leak: Secondary | ICD-10-CM

## 2018-09-03 DIAGNOSIS — Z950 Presence of cardiac pacemaker: Secondary | ICD-10-CM

## 2018-09-03 DIAGNOSIS — Z86711 Personal history of pulmonary embolism: Secondary | ICD-10-CM

## 2018-09-03 DIAGNOSIS — Z86718 Personal history of other venous thrombosis and embolism: Secondary | ICD-10-CM

## 2018-09-03 DIAGNOSIS — Z7951 Long term (current) use of inhaled steroids: Secondary | ICD-10-CM | POA: Diagnosis not present

## 2018-09-03 DIAGNOSIS — M810 Age-related osteoporosis without current pathological fracture: Secondary | ICD-10-CM | POA: Diagnosis present

## 2018-09-03 DIAGNOSIS — G9389 Other specified disorders of brain: Secondary | ICD-10-CM | POA: Diagnosis present

## 2018-09-03 DIAGNOSIS — I2510 Atherosclerotic heart disease of native coronary artery without angina pectoris: Secondary | ICD-10-CM | POA: Diagnosis not present

## 2018-09-03 DIAGNOSIS — Z7901 Long term (current) use of anticoagulants: Secondary | ICD-10-CM | POA: Diagnosis not present

## 2018-09-03 DIAGNOSIS — R569 Unspecified convulsions: Secondary | ICD-10-CM | POA: Diagnosis not present

## 2018-09-03 DIAGNOSIS — I609 Nontraumatic subarachnoid hemorrhage, unspecified: Secondary | ICD-10-CM

## 2018-09-03 DIAGNOSIS — I252 Old myocardial infarction: Secondary | ICD-10-CM

## 2018-09-03 DIAGNOSIS — I69354 Hemiplegia and hemiparesis following cerebral infarction affecting left non-dominant side: Secondary | ICD-10-CM

## 2018-09-03 DIAGNOSIS — Z888 Allergy status to other drugs, medicaments and biological substances status: Secondary | ICD-10-CM | POA: Diagnosis not present

## 2018-09-03 DIAGNOSIS — R4701 Aphasia: Secondary | ICD-10-CM | POA: Diagnosis not present

## 2018-09-03 DIAGNOSIS — R4182 Altered mental status, unspecified: Secondary | ICD-10-CM | POA: Diagnosis present

## 2018-09-03 DIAGNOSIS — G960 Cerebrospinal fluid leak: Secondary | ICD-10-CM

## 2018-09-03 DIAGNOSIS — I480 Paroxysmal atrial fibrillation: Secondary | ICD-10-CM | POA: Diagnosis present

## 2018-09-03 DIAGNOSIS — Z79899 Other long term (current) drug therapy: Secondary | ICD-10-CM

## 2018-09-03 DIAGNOSIS — J8410 Pulmonary fibrosis, unspecified: Secondary | ICD-10-CM | POA: Diagnosis present

## 2018-09-03 DIAGNOSIS — I5021 Acute systolic (congestive) heart failure: Secondary | ICD-10-CM

## 2018-09-03 DIAGNOSIS — E785 Hyperlipidemia, unspecified: Secondary | ICD-10-CM

## 2018-09-03 DIAGNOSIS — Z886 Allergy status to analgesic agent status: Secondary | ICD-10-CM | POA: Diagnosis not present

## 2018-09-03 DIAGNOSIS — I459 Conduction disorder, unspecified: Secondary | ICD-10-CM | POA: Diagnosis present

## 2018-09-03 LAB — I-STAT CHEM 8, ED
BUN: 17 mg/dL (ref 8–23)
CHLORIDE: 106 mmol/L (ref 98–111)
CREATININE: 1.1 mg/dL — AB (ref 0.44–1.00)
Calcium, Ion: 1.06 mmol/L — ABNORMAL LOW (ref 1.15–1.40)
Glucose, Bld: 95 mg/dL (ref 70–99)
HEMATOCRIT: 42 % (ref 36.0–46.0)
Hemoglobin: 14.3 g/dL (ref 12.0–15.0)
POTASSIUM: 3.8 mmol/L (ref 3.5–5.1)
SODIUM: 139 mmol/L (ref 135–145)
TCO2: 25 mmol/L (ref 22–32)

## 2018-09-03 LAB — PROTIME-INR
INR: 1.08
Prothrombin Time: 13.9 seconds (ref 11.4–15.2)

## 2018-09-03 LAB — COMPREHENSIVE METABOLIC PANEL
ALT: 12 U/L (ref 0–44)
ANION GAP: 9 (ref 5–15)
AST: 27 U/L (ref 15–41)
Albumin: 3.7 g/dL (ref 3.5–5.0)
Alkaline Phosphatase: 42 U/L (ref 38–126)
BILIRUBIN TOTAL: 1.1 mg/dL (ref 0.3–1.2)
BUN: 15 mg/dL (ref 8–23)
CO2: 20 mmol/L — AB (ref 22–32)
Calcium: 9.4 mg/dL (ref 8.9–10.3)
Chloride: 108 mmol/L (ref 98–111)
Creatinine, Ser: 1.17 mg/dL — ABNORMAL HIGH (ref 0.44–1.00)
GFR calc Af Amer: 45 mL/min — ABNORMAL LOW (ref >60)
GFR calc non Af Amer: 39 mL/min — ABNORMAL LOW (ref >60)
GLUCOSE: 94 mg/dL (ref 70–99)
POTASSIUM: 3.9 mmol/L (ref 3.5–5.1)
SODIUM: 137 mmol/L (ref 135–145)
Total Protein: 6.1 g/dL — ABNORMAL LOW (ref 6.5–8.1)

## 2018-09-03 LAB — DIFFERENTIAL
Abs Immature Granulocytes: 0.01 10*3/uL (ref 0.00–0.07)
Basophils Absolute: 0 10*3/uL (ref 0.0–0.1)
Basophils Relative: 1 %
EOS ABS: 0.1 10*3/uL (ref 0.0–0.5)
EOS PCT: 3 %
Immature Granulocytes: 0 %
LYMPHS ABS: 1.6 10*3/uL (ref 0.7–4.0)
Lymphocytes Relative: 30 %
MONOS PCT: 10 %
Monocytes Absolute: 0.5 10*3/uL (ref 0.1–1.0)
NEUTROS ABS: 3 10*3/uL (ref 1.7–7.7)
Neutrophils Relative %: 56 %

## 2018-09-03 LAB — CBC
HEMATOCRIT: 47.7 % — AB (ref 36.0–46.0)
HEMOGLOBIN: 14.1 g/dL (ref 12.0–15.0)
MCH: 30.1 pg (ref 26.0–34.0)
MCHC: 29.6 g/dL — ABNORMAL LOW (ref 30.0–36.0)
MCV: 101.9 fL — AB (ref 80.0–100.0)
Platelets: 133 10*3/uL — ABNORMAL LOW (ref 150–400)
RBC: 4.68 MIL/uL (ref 3.87–5.11)
RDW: 12.9 % (ref 11.5–15.5)
WBC: 5.4 10*3/uL (ref 4.0–10.5)
nRBC: 0 % (ref 0.0–0.2)

## 2018-09-03 LAB — I-STAT TROPONIN, ED: Troponin i, poc: 0.01 ng/mL (ref 0.00–0.08)

## 2018-09-03 LAB — APTT: aPTT: 22 seconds — ABNORMAL LOW (ref 24–36)

## 2018-09-03 LAB — ETHANOL

## 2018-09-03 MED ORDER — SENNOSIDES-DOCUSATE SODIUM 8.6-50 MG PO TABS: 1.0000 | ORAL_TABLET | Freq: Every evening | ORAL | Status: DC | PRN

## 2018-09-03 MED ORDER — LEVETIRACETAM 100 MG/ML PO SOLN
500.0000 mg | Freq: Two times a day (BID) | ORAL | Status: DC
  Administered 2018-09-04 – 2018-09-06 (×5): 500 mg via ORAL
  Filled 2018-09-03 (×6): qty 5

## 2018-09-03 MED ORDER — ACETAMINOPHEN 650 MG RE SUPP: 650.0000 mg | RECTAL | Status: DC | PRN

## 2018-09-03 MED ORDER — LEVETIRACETAM 500 MG PO TABS
500.0000 mg | ORAL_TABLET | Freq: Two times a day (BID) | ORAL | Status: DC
  Filled 2018-09-03: qty 1

## 2018-09-03 MED ORDER — SODIUM CHLORIDE 0.9 % IV SOLN
INTRAVENOUS | Status: DC
  Administered 2018-09-03: 22:00:00 via INTRAVENOUS

## 2018-09-03 MED ORDER — STROKE: EARLY STAGES OF RECOVERY BOOK
Freq: Once | Status: AC
  Administered 2018-09-03: 22:00:00
  Filled 2018-09-03: qty 1

## 2018-09-03 MED ORDER — PRAVASTATIN SODIUM 20 MG PO TABS
20.0000 mg | ORAL_TABLET | Freq: Every day | ORAL | Status: DC
  Administered 2018-09-04 – 2018-09-05 (×2): 20 mg via ORAL
  Filled 2018-09-03 (×3): qty 1

## 2018-09-03 MED ORDER — ALBUTEROL SULFATE HFA 108 (90 BASE) MCG/ACT IN AERS
2.0000 | INHALATION_SPRAY | Freq: Four times a day (QID) | RESPIRATORY_TRACT | Status: DC | PRN
  Filled 2018-09-03: qty 6.7

## 2018-09-03 MED ORDER — ACETAMINOPHEN 325 MG PO TABS: 325.0000 mg | ORAL_TABLET | Freq: Once | ORAL | Status: DC

## 2018-09-03 MED ORDER — ACETAMINOPHEN 325 MG PO TABS
650.0000 mg | ORAL_TABLET | ORAL | Status: DC | PRN
  Administered 2018-09-05: 650 mg via ORAL
  Filled 2018-09-03 (×2): qty 2

## 2018-09-03 MED ORDER — ALBUTEROL SULFATE (2.5 MG/3ML) 0.083% IN NEBU: 2.5000 mg | INHALATION_SOLUTION | Freq: Four times a day (QID) | RESPIRATORY_TRACT | Status: DC | PRN

## 2018-09-03 MED ORDER — ACETAMINOPHEN 160 MG/5ML PO SOLN
650.0000 mg | ORAL | Status: DC | PRN
  Administered 2018-09-03: 500 mg

## 2018-09-03 MED ORDER — ACETAMINOPHEN 500 MG PO TABS
500.0000 mg | ORAL_TABLET | Freq: Once | ORAL | Status: AC
  Administered 2018-09-03: 500 mg via ORAL
  Filled 2018-09-03: qty 1

## 2018-09-03 MED ORDER — ASPIRIN 325 MG PO TABS
325.0000 mg | ORAL_TABLET | Freq: Every day | ORAL | Status: DC
  Administered 2018-09-04: 325 mg via ORAL
  Filled 2018-09-03 (×2): qty 1

## 2018-09-03 MED ORDER — ASPIRIN 300 MG RE SUPP: 300.0000 mg | Freq: Every day | RECTAL | Status: DC

## 2018-09-03 NOTE — ED Provider Notes (Signed)
[facility] EMERGENCY DEPARTMENT Provider Note   CSN: [id number] Arrival date & time: 09/03/18  1701   An emergency department physician performed an initial assessment on this suspected stroke patient at 18.  History   Chief Complaint No chief complaint on file.   HPI DEA BITTING is a 84 y.o. female history of DVT off anticoagulation, A. Fib, recent subdural hematoma, here presenting with altered mental status, lethargy.  Patient is from home and around 2 PM, she was walking with her walker.  She then took a nap and family tried to wake her around 4 PM.  She apparently was staring into space and was difficult to arouse.  She also was hypoxic 88% per EMS.  Code stroke was activated by EMS.  Patient is awake and alert on arrival but has some expressive aphasia.  Of note, patient had a fall on 10/22 and was diagnosed with subdural hematoma and was admitted to [facility] and observed overnight. She was taken off on blood thinners and placed on keppra.   The history is provided by the patient.    Past Medical History:  Diagnosis Date  . Acute deep vein thrombosis (DVT) of right peroneal vein 11/30/2017   Overview:  5035: uncomplicated, xarelto  . AKI (acute kidney injury) ([place]) 01/01/2018  . Arthritis   . Atrial fibrillation ([place]) 02/08/2016  . Benign essential HTN 01/15/2016  . Bradycardia with 31 - 40 beats per minute 05/14/2015  . Bronchiectasis, non-tuberculous ([place])    a. Followed by Dr. Joya Gaskins.  . Cerebrovascular accident (CVA) due to thrombosis of right middle cerebral artery ([place]) 02/07/2016   Overview:  2017: R-MCA thrombosis with left non-dom hemiparesis, thalamic capsule ischemia 2018: recurrent  . Chest pain with low risk for cardiac etiology 11/18/2015  . Chronic kidney disease, stage III (moderate) ([place]) 01/15/2016  . Chronic pain of left knee 12/07/2016  . Closed compression fracture of fourth lumbar vertebra ([place]) 01/19/2017   Overview:  2015  . Closed fracture  of ramus of left pubis ([place]) 06/18/2017   Overview:  2018  . Colon polyps   . Complete atrioventricular block ([place]) 01/15/2016   Overview:  2016: PM  . Complete heart block ([place])    a. s/p MDT dual chamber PPM   . COPD (chronic obstructive pulmonary disease) ([place])    past hx with pneumonia  . Coronary artery disease prior MI   . Diverticulosis   . DVT (deep venous thrombosis) (HCC)    Resolved, no anticoagulation currently  . Dyspnea on exertion 10/12/2017   Overview:  2018  . Edema -lower extremity 06/22/2013  . Fatigue 08/19/2012  . GERD (gastroesophageal reflux disease)   . Hammer toe of left foot 06/07/2017  . Headache   . Heart block   . Hemiparesis of left nondominant side as late effect of cerebral infarction ([place]) 12/22/2016  . High cholesterol   . History of pulmonary embolism 08/30/2012   PE 2013  >>repeat CT Angio chest 12/2013>>>NO PE   . Hypertension   . IBS (irritable bowel syndrome)   . Left foot pain 06/07/2017  . Left leg pain 08/03/2016   Overview:  2017: related to weakness left leg from stroke  . Lumbar radiculopathy 07/27/2017  . 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 Malaise and fatigue 10/27/2017  . MI (myocardial infarction) ?? probably not 1975   a. 1975 Reported MI;  b. 2011 Cath: nonobs dzs;  c. 2013 Nonischemic Myoview;  d. 12/2012 Echo: EF  60-65%, Gr1 DD.  . Nephrolithiasis   . Orthostatic syncope 01/01/2018  . Osteoarthritis of right knee 12/12/2012  . Osteoporosis 08/01/2016  . Pain in limb 08/16/2013  . Pain of left femur 06/17/2017   Overview:  2018  . Palpitation    a. 2010 Event Monitor: PACs and PVCs  . Panlobular emphysema ([place]) 01/15/2016  . Paroxysmal supraventricular tachycardia ([place]) 01/15/2016  . Pneumonia   . PONV (postoperative nausea and vomiting)   . Post-dural puncture headache 09/01/2017  . Presence of cardiac pacemaker 01/16/2016   July 5,2016 Overview:  2016: complete AVB  . Presence of permanent cardiac pacemaker   .  Pulmonary fibrosis ([place])   . S/P left knee arthroscopy 04/01/2017  . S/P TKR (total knee replacement) using cement, right 12/07/2016  . Stroke ([place])   . Varicose veins of lower extremities with other complications 60/05/3709  . Vitamin D deficiency 08/01/2016  . Weakness generalized 11/25/2017  . Wears dentures    full  . Wide-complex tachycardia ([place])    a. Followed by Dr. Caryl Comes, "I think this is VT, but i am not sure QRSd 135 1:1 AV."    Patient Active Problem List   Diagnosis Date Noted  . Chest pain 05/18/2018  . Chronic anticoagulation 02/11/2018  . Orthostatic hypotension 02/11/2018  . Chronic diastolic heart failure ([place]) 02/11/2018  . AKI (acute kidney injury) ([place]) 01/01/2018  . Syncope and collapse 01/01/2018  . History of DVT (deep vein thrombosis) 11/30/2017  . Weakness generalized 11/25/2017  . Malaise and fatigue 10/27/2017  . Dyspnea on exertion 10/12/2017  . Post-dural puncture headache 09/01/2017  . Lumbar radiculopathy 07/27/2017  . Closed fracture of ramus of left pubis ([place]) 06/18/2017  . Pain of left femur 06/17/2017  . Hammer toe of left foot 06/07/2017  . Left foot pain 06/07/2017  . S/P left knee arthroscopy 04/01/2017  . Closed compression fracture of fourth lumbar vertebra ([place]) 01/19/2017  . Hemiparesis of left nondominant side as late effect of cerebral infarction ([place]) 12/22/2016  . Chronic pain of left knee 12/07/2016  . S/P TKR (total knee replacement) using cement, right 12/07/2016  . Left leg pain 08/03/2016  . Osteoporosis 08/01/2016  . Pulmonary fibrosis ([place]) 08/01/2016  . Vitamin D deficiency 08/01/2016  . Paroxysmal atrial fibrillation ([place]) 02/08/2016  . Cerebrovascular accident (CVA) due to thrombosis of right middle cerebral artery ([place]) 02/07/2016  . Presence of cardiac pacemaker 01/16/2016  . Hypertensive heart disease 01/15/2016  . Chronic kidney disease, stage III (moderate) ([place]) 01/15/2016  . Complete atrioventricular block ([place])  01/15/2016  . Panlobular emphysema ([place]) 01/15/2016  . Paroxysmal supraventricular tachycardia ([place]) 01/15/2016  . Chest pain with low risk for cardiac etiology 11/18/2015  . Bradycardia with 31 - 40 beats per minute 05/14/2015  . Heart block   . Pain in limb 08/16/2013  . Varicose veins of lower extremities with other complications 62/69/4854  . Edema -lower extremity 06/22/2013  . Osteoarthritis of right knee 12/12/2012  . History of pulmonary embolism 08/30/2012  . Fatigue 08/19/2012  . Wide-complex tachycardia ([place]) 05/30/2012  . Bronchiectasis, non-tuberculous ([place]) 01/06/2012  . 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 Diverticulosis   . High cholesterol   . Coronary artery disease prior MI     Past Surgical History:  Procedure Laterality Date  . APPENDECTOMY    . BLADDER SUSPENSION     tack  . CARDIAC CATHETERIZATION Right 05/14/2015   Procedure: Temporary Pacemaker;  Surgeon: Thompson Grayer, MD;  Location:  [facility] INVASIVE CV LAB;  Service: Cardiovascular;  Laterality: Right;  . CHOLECYSTECTOMY    . COLON SURGERY     12inches colectomy s/p diverticulitis  . COLONOSCOPY W/ BIOPSIES AND POLYPECTOMY    . EP IMPLANTABLE DEVICE N/A 05/14/2015   Procedure: Pacemaker Implant;  Surgeon: Thompson Grayer, MD; [place] 1 (serial number [id number])     . IR FL GUIDED LOC OF NEEDLE/CATH TIP FOR SPINAL INJECTION LT  09/06/2017  . KNEE ARTHROSCOPY Left 03/29/2017   Procedure: ARTHROSCOPY LEFT KNEE WITH PARTIAL MEDIAL AND PARTIAL LATERAL MENISCECTOMY;  Surgeon: Vickey Huger, MD;  Location: [facility];  Service: Orthopedics;  Laterality: Left;  Marland Kitchen MULTIPLE TOOTH EXTRACTIONS    . TONSILLECTOMY    . TOTAL ABDOMINAL HYSTERECTOMY    . TOTAL KNEE ARTHROPLASTY  12/12/2012   Procedure: TOTAL KNEE ARTHROPLASTY;  Surgeon: Alta Corning, MD;  Location: [facility];  Service: Orthopedics;  Laterality: Right;  right total knee arthroplasty     OB History   None      Home  Medications    Prior to Admission medications   Medication Sig Start Date End Date Taking? Authorizing Provider  acetaminophen (TYLENOL) 500 MG tablet Take 1,000 mg by mouth every 6 (six) hours as needed (pain).   Yes [provider]  budesonide-formoterol (SYMBICORT) 160-4.5 MCG/ACT inhaler Inhale 2 puffs into the lungs 2 (two) times daily as needed (wheezing/shortness of breath).   Yes [provider]  cholecalciferol (VITAMIN D) 1000 units tablet Take 1,000 Units by mouth daily.   Yes [provider]  levETIRAcetam (KEPPRA) 500 MG tablet Take 500 mg by mouth 2 (two) times daily. 08/31/18  Yes [provider]  vitamin B-12 (CYANOCOBALAMIN) 1000 MCG tablet Take 1,000 mcg by mouth daily.   Yes [provider]  amLODipine (NORVASC) 2.5 MG tablet Take 1 tablet (2.5 mg total) by mouth daily. Patient not taking: Reported on 09/03/2018 05/20/18   Ledell Noss, MD  diclofenac sodium (VOLTAREN) 1 % GEL Apply 2 g topically 4 (four) times daily. Patient not taking: Reported on 09/03/2018 05/19/18   Ledell Noss, MD  pravastatin (PRAVACHOL) 20 MG tablet Take 1 tablet (20 mg total) by mouth daily at 6 PM. Patient not taking: Reported on 04/08/2018 03/18/18   Elmarie Shiley, MD    Family History Family History  Problem Relation Age of Onset  . Heart disease Father   . Heart attack Mother   . Stomach cancer Brother   . Other Son        Triple Bypass    Social History Social History   Tobacco Use  . Smoking status: Never Smoker  . Smokeless tobacco: Never Used  Substance Use Topics  . Alcohol use: No  . Drug use: No     Allergies   Crestor [rosuvastatin]; Darvon; Meloxicam; Meperidine; Prednisone; Valsartan; Amiodarone; Aspirin-acetaminophen-caffeine; Nitroglycerin; and Tramadol   Review of Systems Review of Systems  Psychiatric/Behavioral: Positive for confusion.  All other systems reviewed and are negative.    Physical Exam Updated Vital  Signs BP (!) 164/113 (BP Location: Right Arm)   Pulse 63   Temp 98 F (36.7 C) (Oral)   Resp 15   SpO2 97%   Physical Exam  Constitutional:  Elderly   HENT:  Head: Normocephalic.  Eyes: Pupils are equal, round, and reactive to light. Conjunctivae and EOM are normal.  Neck: Normal range of motion. Neck supple.  Cardiovascular: Normal rate, regular rhythm and  normal heart sounds.  Pulmonary/Chest: Effort normal and breath sounds normal. No stridor. No respiratory distress. She has no wheezes.  Abdominal: Soft. Bowel sounds are normal. She exhibits no distension. There is no tenderness.  Musculoskeletal: Normal range of motion.  Neurological: She is alert.  Alert, awake. Some expressive aphasia. No obvious facial droop. Strength 5/5 throughout   Skin: Skin is warm.  Psychiatric:  Unable   Nursing note and vitals reviewed.    ED Treatments / Results  Labs (all labs ordered are listed, but only abnormal results are displayed) Labs Reviewed  APTT - Abnormal; Notable for the following components:      Result Value   aPTT 22 (*)    All other components within normal limits  CBC - Abnormal; Notable for the following components:   HCT 47.7 (*)    MCV 101.9 (*)    MCHC 29.6 (*)    Platelets 133 (*)    All other components within normal limits  COMPREHENSIVE METABOLIC PANEL - Abnormal; Notable for the following components:   CO2 20 (*)    Creatinine, Ser 1.17 (*)    Total Protein 6.1 (*)    GFR calc non Af Amer 39 (*)    GFR calc Af Amer 45 (*)    All other components within normal limits  I-STAT CHEM 8, ED - Abnormal; Notable for the following components:   Creatinine, Ser 1.10 (*)    Calcium, Ion 1.06 (*)    All other components within normal limits  PROTIME-INR  DIFFERENTIAL  I-STAT TROPONIN, ED  CBG MONITORING, ED    EKG EKG Interpretation  Date/Time:  Saturday September 03 2018 17:22:31 EDT Ventricular Rate:  81 PR Interval:    QRS Duration: 151 QT  Interval:  397 QTC Calculation: 461 R Axis:   -88 Text Interpretation:  Sinus rhythm Ventricular premature complex Probable left atrial enlargement Nonspecific IVCD with LAD LVH with secondary repolarization abnormality Probable lateral infarct, age indeterminate No significant change since last tracing Confirmed by Wandra Arthurs ([id number]) on 09/03/2018 5:57:47 PM   Radiology Dg Chest Port 1 View  Result Date: 09/03/2018 CLINICAL DATA:  Weakness and slurred speech. History of hypertension and COPD. EXAM: PORTABLE CHEST 1 VIEW COMPARISON:  08/30/2018 FINDINGS: Cardiac silhouette is mildly enlarged. No mediastinal or hilar masses. Lungs are hyperexpanded. No evidence of pneumonia or pulmonary edema. No pleural effusion or pneumothorax. Stable left anterior chest wall sequential pacemaker. Skeletal structures are grossly intact. IMPRESSION: No acute cardiopulmonary disease. Electronically Signed   By: Lajean Manes M.D.   On: 09/03/2018 18:19   Ct Head Code Stroke Wo Contrast  Result Date: 09/03/2018 CLINICAL DATA:  Code stroke.  Recent intracranial hemorrhage. EXAM: CT HEAD WITHOUT CONTRAST TECHNIQUE: Contiguous axial images were obtained from the base of the skull through the vertex without intravenous contrast. COMPARISON:  Head CT 08/30/2018 FINDINGS: Brain: No acute hemorrhage. Diffuse enlargement of the extra-axial CSF spaces, unchanged. No acute cortical infarct. Unchanged appearance of old left occipital infarct. Previously seen small volume subarachnoid hemorrhage is no longer visible. Vascular: Atherosclerotic calcification of the internal carotid arteries at the skull base. No abnormal hyperdensity of the major intracranial arteries or dural venous sinuses. Skull: The visualized skull base, calvarium and extracranial soft tissues are normal. Sinuses/Orbits: No fluid levels or advanced mucosal thickening of the visualized paranasal sinuses. No mastoid or middle ear effusion. The orbits are normal.  ASPECTS [facility] Stroke Program Early CT Score) - Ganglionic level infarction (caudate, lentiform  nuclei, internal capsule, insula, M1-M3 cortex): 7 - Supraganglionic infarction (M4-M6 cortex): 3 Total score (0-10 with 10 being normal): 10 IMPRESSION: 1. No acute hemorrhage. Resolution of previously identified small volume subarachnoid hemorrhage. 2. ASPECTS is 10. These results were communicated to Dr. Karena Addison Aroor at 5:19 pm on 09/03/2018 by text page via the [facility] messaging system. Electronically Signed   By: Ulyses Jarred M.D.   On: 09/03/2018 17:20    Procedures Procedures (including critical care time)  Medications Ordered in ED Medications - No data to display   Initial Impression / Assessment and Plan / ED Course  I have reviewed the triage vital signs and the nursing notes.  Pertinent labs & imaging results that were available during my care of the patient were reviewed by me and considered in my medical decision making (see chart for details).    [name] [name] is a 84 y.o. female here with AMS, expressive aphasia. Consider ischemic stroke vs worsening subdural hematoma. Code stroke initiated by EMS. Neuro at bedside. Will get labs, CT head.   6:52 PM CT head showed no acute hemorrhage. Dr. Lorraine Lax recommend repeat CT in 24 hrs and admission for stroke workup. Patient has a pacemaker that is not MRI compatible.    Final Clinical Impressions(s) / ED Diagnoses   Final diagnoses:  None    ED Discharge Orders    None       Drenda Freeze, MD 09/03/18 Lurena Nida

## 2018-09-03 NOTE — ED Triage Notes (Signed)
Patient arrived by [facility] EMS after being activated as code stroke. At 1600 had weakness and slurred speech. On arrival alert and oriented. Just treated at [facility] on 10/22 for [facility] following fall.  LKW 1400. Denies pain, arrived with PIV

## 2018-09-03 NOTE — H&P (Signed)
[facility] Admission History and Physical Service Pager: [phone]  Patient name: [name] [name] Medical record number: [id number] Date of birth: 15-Oct-1924 Age: 84 y.o. Gender: female  Primary Care Provider: Algis Greenhouse, MD Consultants: Neurology Code Status: Full  Chief Complaint: Altered mental status  Assessment and Plan: [name] [name] is a 84 y.o. female presenting with AMS x1day . PMH is significant for HLD, CAD, HTN, Heart block w/ pacemaker, CKD III, paroxysmal atrial fibrillation   AMS Patient with altered mental status x1 day.  Unclear etiology at this time.  Code stroke called while patient was in ED.  At that time neurology evaluated patient.  Recommended admission for work-up of TIA versus seizure. Unlikely related to alcohol as patient and family do not report any alcohol use.  Will obtain ethanol level to rule this out.  Unlikely trauma as no recent falls, however, recent subarachnoid hemorrhage.  CT showing improvement of this hemorrhage.  Patient is not diabetic so unrelated to hypoglycemia.  Will eat uremia however labs are stable.  Cannot rule out toxin exposure so will obtain UDS.  Unlikely infectious as CBC showing no leukocytosis and vitals showing no fever.  Patient's son does report odor change in urine with recent incontinence episode so will obtain urinalysis to rule this out.  Likely patient does have seizure versus TIA.  Will continue work-up per neurology recommendations. -Admit to telemetry, attending Dr. Owens Shark. -Neurology consulted, appreciate recommendations: Permissive hypertension up to 220/110, repeat head CT, CTA, echo, aspirin, high intensity statin if LDL over 70, risk stratification labs, telemetry, frequent neurochecks, stroke swallow screen, EEG -PT/OT/SLP -vitals per routine -A1C/lipid panel -UA -UDS/ethanol  -seizure precautions -OOB with assistance  -am CBC/BMP  Recent subarachnoid hemorrhage Patient recently  hospitalized at [facility] for subarachnoid hemorrhage from October 22-23. No notes available however clinical summary is available.  CT head from 08/31/2018 showing acute right parietal sulcal subarachnoid hemorrhage. CT on admission showing no acute hemorrhage and resolution of previously identified small volume SA hemorrhage.  -repeat head CT in am  -neurology consulted, appreciate recommendations  -will avoid anticoagulation, ASA approved per neurology consult note from admission   HLD Home med: pravastatin 31m. Patient reports she has not taken this is some time. Son and daughter also report she has not been on this medication.  -will continue pravastatin 244mas patient admitted for possible TIA -lipid panel in AM  HTN Home med: amlodipine 2.11m109mPatient reports she has not taken this is some time. Son and daughter also report she has not been on this medication.  -allowing permissive HTN to 220/110 -holding all anti-hypertensives at this time   Heart block w/ pacemaker placement Patient reports pacemaker placement for heart block but is unsure which type. Per cardiology note from 05/18/18 appears to be complete heart block. EKG on admission showing no significant changes from previous.  -monitor on telemetry   Afib Patient initially on Xarelto but was discontinued due to subarachnoid hemorrhage. CHADSVASC 8 (CHF, HTN, VASC, Age, female, stroke hx). Regular rhythm on exam.  -monitor on telemetry  -hold anticoagulation given recent subarachnoid hemorrhage  CHF Recent echo from 05/2018 showing worsening EF to 45-50% with hypokinesis of inferior/inferoseptal walls. Mild MR noted. PA pressure 33 mmHg. Euvolemic on exam with dry mucous membranes. CXR negative on admission.  -NS _0 /hr x12 hrs -repeat echo pending  CKD III Cr of 1.17 with GFR 39 on admission. Per chart review BL appears to be ~1.2.  -monitor  on daily BMP -NS _0 /hr   Weakness  Per family patient has been  progressively weaker since discharge from [facility]. Patient only waking for meals and not completing meals.  -PT/OT -consult to nutrition, appreciate recommendations  -NPO pending swallow study   FEN/GI: NPO pending swallow study Prophylaxis: SCDs  Disposition: admit to telemetry, attending Dr. Owens Shark   History of Present Illness:  [name] [name] is a 84 y.o. female presenting with AMS beginning this afternoon.   History provided by patient's son.  Per patient's son patient began to have altered mental status beginning at 4 PM.  States that she was recently discharged from [place] this past week with new medications of Keppra.  Patient since has been very drowsy/sleepy since discharge.  States that she sleeps 20 hours a day.  States that she only wakes up for meals.  This morning she ate breakfast and then went back to sleep.  Around 2 PM she was up out of bed and had lunch.  Patient was talking to her son at that time.  Patient then went back to bed as and a few hours later patient's son got a call from visions husband stating that something was wrong.  At that time patient was looking up at the ceiling with her mouth open.  Would not respond.  Would not follow commands.  Patient's son reports that it almost seemed like she cannot understand what he is saying.  At that time he called 911.  During EMS ride to hospital patient regained baseline mental status.  No medications were given in EMS per patient's son.  Oxygen was given as her saturations dropped to 80s.  Recently his son notes that her baseline is strange.  Patient was previously very independent.  Did all ADLs on her own.  Had spinal injections and no longer was using a walker.  Since discharge she has been much [name] tired.  No longer can do most ADLs and requires her son's assistance for ambulation and going to the bathroom.  Does state that she has lucid periods where she is very talkative however most the time she is very tired and weak.   Patient's son does note that he noticed her urine had a stronger odor the past 2 days ago.  Patient had episode of incontinence 2 days ago at which time he noticed this odor.   Patient normally is not on any medications.  Takes B12 supplements.  Has been recently been taking Keppra twice a day after discharge from [facility].  Per chart review patient was admitted to [facility] with subarachnoid hemorrhage.  No notes available however clinical summary is available.  CT head from 08/31/2018 showing acute right parietal sulcal subarachnoid hemorrhage.  In the ED code stroke was called and patient was evaluated by neurology.  Per neurology notes likely patient had TIA versus seizure.  Patient did not meet TPA criteria as she had recent hemorrhage and low NIH stroke scale.  Review Of Systems: Per HPI with the following additions:   Review of Systems  Unable to perform ROS: Mental status change    Patient Active Problem List   Diagnosis Date Noted  . AMS (altered mental status) 09/03/2018  . Chest pain 05/18/2018  . Chronic anticoagulation 02/11/2018  . Orthostatic hypotension 02/11/2018  . Chronic diastolic heart failure ([place]) 02/11/2018  . AKI (acute kidney injury) ([place]) 01/01/2018  . Syncope and collapse 01/01/2018  . History of DVT (deep vein thrombosis) 11/30/2017  . Weakness  generalized 11/25/2017  . Malaise and fatigue 10/27/2017  . Dyspnea on exertion 10/12/2017  . Post-dural puncture headache 09/01/2017  . Lumbar radiculopathy 07/27/2017  . Closed fracture of ramus of left pubis ([place]) 06/18/2017  . Pain of left femur 06/17/2017  . Hammer toe of left foot 06/07/2017  . Left foot pain 06/07/2017  . S/P left knee arthroscopy 04/01/2017  . Closed compression fracture of fourth lumbar vertebra ([place]) 01/19/2017  . Hemiparesis of left nondominant side as late effect of cerebral infarction ([place]) 12/22/2016  . Chronic pain of left knee 12/07/2016  . S/P TKR (total knee replacement)  using cement, right 12/07/2016  . Left leg pain 08/03/2016  . Osteoporosis 08/01/2016  . Pulmonary fibrosis ([place]) 08/01/2016  . Vitamin D deficiency 08/01/2016  . Paroxysmal atrial fibrillation ([place]) 02/08/2016  . Cerebrovascular accident (CVA) due to thrombosis of right middle cerebral artery ([place]) 02/07/2016  . Presence of cardiac pacemaker 01/16/2016  . Hypertensive heart disease 01/15/2016  . Chronic kidney disease, stage III (moderate) ([place]) 01/15/2016  . Complete atrioventricular block ([place]) 01/15/2016  . Panlobular emphysema ([place]) 01/15/2016  . Paroxysmal supraventricular tachycardia ([place]) 01/15/2016  . Chest pain with low risk for cardiac etiology 11/18/2015  . Bradycardia with 31 - 40 beats per minute 05/14/2015  . Heart block   . Pain in limb 08/16/2013  . Varicose veins of lower extremities with other complications 09/32/6712  . Edema -lower extremity 06/22/2013  . Osteoarthritis of right knee 12/12/2012  . History of pulmonary embolism 08/30/2012  . Fatigue 08/19/2012  . Wide-complex tachycardia ([place]) 05/30/2012  . Bronchiectasis, non-tuberculous ([place]) 01/06/2012  . 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 Diverticulosis   . High cholesterol   . Coronary artery disease prior MI     Past Medical History: Past Medical History:  Diagnosis Date  . Acute deep vein thrombosis (DVT) of right peroneal vein 11/30/2017   Overview:  4580: uncomplicated, xarelto  . AKI (acute kidney injury) ([place]) 01/01/2018  . Arthritis   . Atrial fibrillation ([place]) 02/08/2016  . Benign essential HTN 01/15/2016  . Bradycardia with 31 - 40 beats per minute 05/14/2015  . Bronchiectasis, non-tuberculous ([place])    a. Followed by Dr. Joya Gaskins.  . Cerebrovascular accident (CVA) due to thrombosis of right middle cerebral artery ([place]) 02/07/2016   Overview:  2017: R-MCA thrombosis with left non-dom hemiparesis, thalamic capsule ischemia 2018: recurrent  . Chest pain with low risk for  cardiac etiology 11/18/2015  . Chronic kidney disease, stage III (moderate) ([place]) 01/15/2016  . Chronic pain of left knee 12/07/2016  . Closed compression fracture of fourth lumbar vertebra ([place]) 01/19/2017   Overview:  2015  . Closed fracture of ramus of left pubis ([place]) 06/18/2017   Overview:  2018  . Colon polyps   . Complete atrioventricular block ([place]) 01/15/2016   Overview:  2016: PM  . Complete heart block ([place])    a. s/p MDT dual chamber PPM   . COPD (chronic obstructive pulmonary disease) ([place])    past hx with pneumonia  . Coronary artery disease prior MI   . Diverticulosis   . DVT (deep venous thrombosis) ([facility])    Resolved, no anticoagulation currently  . Dyspnea on exertion 10/12/2017   Overview:  2018  . Edema -lower extremity 06/22/2013  . Fatigue 08/19/2012  . GERD (gastroesophageal reflux disease)   . Hammer toe of left foot 06/07/2017  . Headache   . Heart block   . Hemiparesis of left nondominant side  as late effect of cerebral infarction ([place]) 12/22/2016  . High cholesterol   . History of pulmonary embolism 08/30/2012   PE 2013  >>repeat CT Angio chest 12/2013>>>NO PE   . Hypertension   . IBS (irritable bowel syndrome)   . Left foot pain 06/07/2017  . Left leg pain 08/03/2016   Overview:  2017: related to weakness left leg from stroke  . Lumbar radiculopathy 07/27/2017  . 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 Malaise and fatigue 10/27/2017  . MI (myocardial infarction) ?? probably not 1975   a. 1975 Reported MI;  b. 2011 Cath: nonobs dzs;  c. 2013 Nonischemic Myoview;  d. 12/2012 Echo: EF 60-65%, Gr1 DD.  . Nephrolithiasis   . Orthostatic syncope 01/01/2018  . Osteoarthritis of right knee 12/12/2012  . Osteoporosis 08/01/2016  . Pain in limb 08/16/2013  . Pain of left femur 06/17/2017   Overview:  2018  . Palpitation    a. 2010 Event Monitor: PACs and PVCs  . Panlobular emphysema ([place]) 01/15/2016  . Paroxysmal supraventricular tachycardia ([place]) 01/15/2016   . Pneumonia   . PONV (postoperative nausea and vomiting)   . Post-dural puncture headache 09/01/2017  . Presence of cardiac pacemaker 01/16/2016   July 5,2016 Overview:  2016: complete AVB  . Presence of permanent cardiac pacemaker   . Pulmonary fibrosis ([place])   . S/P left knee arthroscopy 04/01/2017  . S/P TKR (total knee replacement) using cement, right 12/07/2016  . Stroke ([place])   . Varicose veins of lower extremities with other complications 83/01/8249  . Vitamin D deficiency 08/01/2016  . Weakness generalized 11/25/2017  . Wears dentures    full  . Wide-complex tachycardia ([place])    a. Followed by Dr. Caryl Comes, "I think this is VT, but i am not sure QRSd 135 1:1 AV."    Past Surgical History: Past Surgical History:  Procedure Laterality Date  . APPENDECTOMY    . BLADDER SUSPENSION     tack  . CARDIAC CATHETERIZATION Right 05/14/2015   Procedure: Temporary Pacemaker;  Surgeon: Thompson Grayer, MD;  Location: [facility];  Service: Cardiovascular;  Laterality: Right;  . CHOLECYSTECTOMY    . COLON SURGERY     12inches colectomy s/p diverticulitis  . COLONOSCOPY W/ BIOPSIES AND POLYPECTOMY    . EP IMPLANTABLE DEVICE N/A 05/14/2015   Procedure: Pacemaker Implant;  Surgeon: Thompson Grayer, MD; [place] 1 (serial number [id number])     . IR FL GUIDED LOC OF NEEDLE/CATH TIP FOR SPINAL INJECTION LT  09/06/2017  . KNEE ARTHROSCOPY Left 03/29/2017   Procedure: ARTHROSCOPY LEFT KNEE WITH PARTIAL MEDIAL AND PARTIAL LATERAL MENISCECTOMY;  Surgeon: Vickey Huger, MD;  Location: [facility];  Service: Orthopedics;  Laterality: Left;  Marland Kitchen MULTIPLE TOOTH EXTRACTIONS    . TONSILLECTOMY    . TOTAL ABDOMINAL HYSTERECTOMY    . TOTAL KNEE ARTHROPLASTY  12/12/2012   Procedure: TOTAL KNEE ARTHROPLASTY;  Surgeon: Alta Corning, MD;  Location: [facility];  Service: Orthopedics;  Laterality: Right;  right total knee arthroplasty    Social History: Social History   Tobacco Use  . Smoking status:  Never Smoker  . Smokeless tobacco: Never Used  Substance Use Topics  . Alcohol use: No  . Drug use: No   Additional social history: lives with husband, son now helps with ADLs  Please also refer to relevant sections of EMR.  Family History: Family History  Problem Relation Age of Onset  . Heart  disease Father   . Heart attack Mother   . Stomach cancer Brother   . Other Son        Triple [place]pass     Allergies and Medications: Allergies  Allergen Reactions  . Crestor [Rosuvastatin] Other (See Comments)    Myalgia  . Darvon Other (See Comments)    Drop in  BP  . Meloxicam Other (See Comments)    Dizziness   . Meperidine Other (See Comments)    Hypotension  . Prednisone Diarrhea    POLYURIA  . Valsartan Other (See Comments)     Hypotension  . Amiodarone Other (See Comments)    Did not tolerate - unknown reaction  . Aspirin-Acetaminophen-Caffeine Other (See Comments)    Unknown reaction  . Nitroglycerin     Profound hypotension with sublingual nitroglycerin.   . Tramadol Other (See Comments)    Unknown reaction   No current facility-administered medications on file prior to encounter.    Current Outpatient Medications on File Prior to Encounter  Medication Sig Dispense Refill  . acetaminophen (TYLENOL) 500 MG tablet Take 1,000 mg by mouth every 6 (six) hours as needed (pain).    . budesonide-formoterol (SYMBICORT) 160-4.5 MCG/ACT inhaler Inhale 2 puffs into the lungs 2 (two) times daily as needed (wheezing/shortness of breath).    . cholecalciferol (VITAMIN D) 1000 units tablet Take 1,000 Units by mouth daily.    Marland Kitchen levETIRAcetam (KEPPRA) 500 MG tablet Take 500 mg by mouth 2 (two) times daily.  0  . vitamin B-12 (CYANOCOBALAMIN) 1000 MCG tablet Take 1,000 mcg by mouth daily.    Marland Kitchen amLODipine (NORVASC) 2.5 MG tablet Take 1 tablet (2.5 mg total) by mouth daily. (Patient not taking: Reported on 09/03/2018) 30 tablet 0  . diclofenac sodium (VOLTAREN) 1 % GEL Apply 2 g  topically 4 (four) times daily. (Patient not taking: Reported on 09/03/2018) 1 Tube 1  . pravastatin (PRAVACHOL) 20 MG tablet Take 1 tablet (20 mg total) by mouth daily at 6 PM. (Patient not taking: Reported on 04/08/2018) 20 tablet 0    Objective: BP (!) 162/72   Pulse 62   Temp 98 F (36.7 C) (Oral)   Resp 17   SpO2 95%  Exam: General: awake and oriented x3, dry mucous membranes Eyes: PERRL, EOMI ENTM: dry mucous membranes, oropharynx clear Neck: supple, no LAD Cardiovascular: RRR, no MRG Respiratory: CTAB, no wheezes, rales, or rhonchi, able to speak full sentences, no increased WOB Gastrointestinal: soft, non tender, non distended, bowel sounds normal  MSK: no edema, 3/5 muscle strength in lower extremities bilaterally, 4/5 muscle strength in upper extremities bilaterally, normal grip strength  Derm: no rashes, intact, warm  Neuro: CN2-12 intact, sensation intact bilaterally, no finger to nose dysmetria, oriented x3 Psych: normal affect   Labs and Imaging: CBC BMET  Recent Labs  Lab 09/03/18 1716  WBC 5.4  HGB 14.1  HCT 47.7*  PLT 133*   Recent Labs  Lab 09/03/18 1716  NA 137  K 3.9  CL 108  CO2 20*  BUN 15  CREATININE 1.17*  GLUCOSE 94  CALCIUM 9.4       Ref. Range 09/03/2018 17:16  Prothrombin Time Latest Ref Range: 11.4 - 15.2 seconds 13.9  INR Unknown 1.08   Dg Chest Port 1 View  Result Date: 09/03/2018 CLINICAL DATA:  Weakness and slurred speech. History of hypertension and COPD. EXAM: PORTABLE CHEST 1 VIEW COMPARISON:  08/30/2018 FINDINGS: Cardiac silhouette is mildly enlarged. No mediastinal or hilar  masses. Lungs are hyperexpanded. No evidence of pneumonia or pulmonary edema. No pleural effusion or pneumothorax. Stable left anterior chest wall sequential pacemaker. Skeletal structures are grossly intact. IMPRESSION: No acute cardiopulmonary disease. Electronically Signed   By: Lajean Manes M.D.   On: 09/03/2018 18:19   Ct Head Code Stroke Wo  Contrast  Result Date: 09/03/2018 CLINICAL DATA:  Code stroke.  Recent intracranial hemorrhage. EXAM: CT HEAD WITHOUT CONTRAST TECHNIQUE: Contiguous axial images were obtained from the base of the skull through the vertex without intravenous contrast. COMPARISON:  Head CT 08/30/2018 FINDINGS: Brain: No acute hemorrhage. Diffuse enlargement of the extra-axial CSF spaces, unchanged. No acute cortical infarct. Unchanged appearance of old left occipital infarct. Previously seen small volume subarachnoid hemorrhage is no longer visible. Vascular: Atherosclerotic calcification of the internal carotid arteries at the skull base. No abnormal hyperdensity of the major intracranial arteries or dural venous sinuses. Skull: The visualized skull base, calvarium and extracranial soft tissues are normal. Sinuses/Orbits: No fluid levels or advanced mucosal thickening of the visualized paranasal sinuses. No mastoid or middle ear effusion. The orbits are normal. ASPECTS [facility] Stroke Program Early CT Score) - Ganglionic level infarction (caudate, lentiform nuclei, internal capsule, insula, M1-M3 cortex): 7 - Supraganglionic infarction (M4-M6 cortex): 3 Total score (0-10 with 10 being normal): 10 IMPRESSION: 1. No acute hemorrhage. Resolution of previously identified small volume subarachnoid hemorrhage. 2. ASPECTS is 10. These results were communicated to Dr. Karena Addison Aroor at 5:19 pm on 09/03/2018 by text page via the [facility] messaging system. Electronically Signed   By: Ulyses Jarred M.D.   On: 09/03/2018 17:20    [name] More, DO 09/03/2018, 9:28 PM PGY-2, [place] Intern pager: [phone], text pages welcome

## 2018-09-03 NOTE — ED Notes (Signed)
Re-paged family med

## 2018-09-03 NOTE — Progress Notes (Signed)
Admitted to room from ED via hospital bed. Awake, alert and oriented X 4, patient ambulated with walker and 1 assist to Bathroom.  Per report, patient sustained fall approximately a week ago, was checked out, acute infarct r/o. This is seizure vs TIA. Patient monitor placed, verified. Normal saline at 50 cc /hr infusing into R AC . Bed alarm on. Call bell in reach. Safety maintained.

## 2018-09-03 NOTE — Consult Note (Addendum)
[place]   Requesting Physician: Dr. Darl Householder    Chief Complaint:  [name] [name] slurred speech   History obtained from:  Patient/ chart    HPI:                                                                                                                                         [name] [name] is an 84 y.o. female PMH significant for recent traumatic SAH ( 08/30/18), CVA ( 2013 left side deficits and 2017, HTN, HLD, DVT ( xarelto), CKD 3,  who presented to [facility] ED as a code stroke for weakness and slurred speech.  Per EMS report patient was at home. At 1400 was walking with her walker. At 1600 had a sudden onset of left side weakness, and trouble talking. Patient recently seen at [facility] after she fell and was found to have [place]. Patient arrived on a NRB for 02 sats 88%. Patient has a pacemaker. Denies vision changes, SOB, numbness, tingling. Patient prescribed keppra 500 mg BID for 7 days at discharge from [facility]. Patient walks with walker at baseline.   ED course:  CT head: no hemorrhage. BP: 138/72 BG: 111  CVA 2017: right  MCA with left hemiparesis   CVA 2018 thalamic capsule CVA 2013: left occipital hemorrhage,   Date last known well: Date: 09/03/2018 Time last known well: Time: 14:00 tPA Given: No: recent SAH  Modified Rankin: Rankin Score=3 NIHSS:1    Past Medical History:  Diagnosis Date  . Acute deep vein thrombosis (DVT) of right peroneal vein ([place]) 11/30/2017   Overview:  8756: uncomplicated, xarelto  . AKI (acute kidney injury) ([place]) 01/01/2018  . Arthritis   . Atrial fibrillation ([place]) 02/08/2016  . Benign essential HTN 01/15/2016  . Bradycardia with 31 - 40 beats per minute 05/14/2015  . Bronchiectasis, non-tuberculous ([place])    a. Followed by Dr. Joya Gaskins.  . Cerebrovascular accident (CVA) due to thrombosis of right middle cerebral artery ([place]) 02/07/2016   Overview:  2017: R-MCA thrombosis with left  non-dom hemiparesis, thalamic capsule ischemia 2018: recurrent  . Chest pain with low risk for cardiac etiology 11/18/2015  . Chronic kidney disease, stage III (moderate) ([place]) 01/15/2016  . Chronic pain of left knee 12/07/2016  . Closed compression fracture of fourth lumbar vertebra ([place]) 01/19/2017   Overview:  2015  . Closed fracture of ramus of left pubis ([place]) 06/18/2017   Overview:  2018  . Colon polyps   . Complete atrioventricular block ([place]) 01/15/2016   Overview:  2016: PM  . Complete heart block ([place])    a. s/p MDT dual  chamber PPM   . COPD (chronic obstructive pulmonary disease) ([place])    past hx with pneumonia  . Coronary artery disease prior MI   . Diverticulosis   . DVT (deep venous thrombosis) (H[facility])    Resolved, no anticoagulation currently  . Dyspnea on exertion 10/12/2017   Overview:  2018  . Edema -lower extremity 06/22/2013  . Fatigue 08/19/2012  . GERD (gastroesophageal reflux disease)   . Hammer toe of left foot 06/07/2017  . Headache   . Heart block   . Hemiparesis of left nondominant side as late effect of cerebral infarction ([place]) 12/22/2016  . High cholesterol   . History of pulmonary embolism 08/30/2012   PE 2013  >>repeat CT Angio chest 12/2013>>>NO PE   . Hypertension   . IBS (irritable bowel syndrome)   . Left foot pain 06/07/2017  . Left leg pain 08/03/2016   Overview:  2017: related to weakness left leg from stroke  . Lumbar radiculopathy 07/27/2017  . 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 Malaise and fatigue 10/27/2017  . MI (myocardial infarction) ?? probably not 1975   a. 1975 Reported MI;  b. 2011 Cath: nonobs dzs;  c. 2013 Nonischemic Myoview;  d. 12/2012 Echo: EF 60-65%, Gr1 DD.  . Nephrolithiasis   . Orthostatic syncope 01/01/2018  . Osteoarthritis of right knee 12/12/2012  . Osteoporosis 08/01/2016  . Pain in limb 08/16/2013  . Pain of left femur 06/17/2017   Overview:  2018  . Palpitation    a. 2010 Event Monitor: PACs and PVCs  .  Panlobular emphysema ([place]) 01/15/2016  . Paroxysmal supraventricular tachycardia ([place]) 01/15/2016  . Pneumonia   . PONV (postoperative nausea and vomiting)   . Post-dural puncture headache 09/01/2017  . Presence of cardiac pacemaker 01/16/2016   July 5,2016 Overview:  2016: complete AVB  . Presence of permanent cardiac pacemaker   . Pulmonary fibrosis ([place])   . S/P left knee arthroscopy 04/01/2017  . S/P TKR (total knee replacement) using cement, right 12/07/2016  . Stroke ([place])   . Varicose veins of lower extremities with other complications 62/12/2977  . Vitamin D deficiency 08/01/2016  . Weakness generalized 11/25/2017  . Wears dentures    full  . Wide-complex tachycardia ([place])    a. Followed by Dr. Caryl Comes, "I think this is VT, but i am not sure QRSd 135 1:1 AV."    Past Surgical History:  Procedure Laterality Date  . APPENDECTOMY    . BLADDER SUSPENSION     tack  . CARDIAC CATHETERIZATION Right 05/14/2015   Procedure: Temporary Pacemaker;  Surgeon: Thompson Grayer, MD;  Location: [facility];  Service: Cardiovascular;  Laterality: Right;  . CHOLECYSTECTOMY    . COLON SURGERY     12inches colectomy s/p diverticulitis  . COLONOSCOPY W/ BIOPSIES AND POLYPECTOMY    . EP IMPLANTABLE DEVICE N/A 05/14/2015   Procedure: Pacemaker Implant;  Surgeon: Thompson Grayer, MD; [place] [facility] (serial number [id number])     . IR FL GUIDED LOC OF NEEDLE/CATH TIP FOR SPINAL INJECTION LT  09/06/2017  . KNEE ARTHROSCOPY Left 03/29/2017   Procedure: ARTHROSCOPY LEFT KNEE WITH PARTIAL MEDIAL AND PARTIAL LATERAL MENISCECTOMY;  Surgeon: Vickey Huger, MD;  Location: [facility];  Service: Orthopedics;  Laterality: Left;  Marland Kitchen MULTIPLE TOOTH EXTRACTIONS    . TONSILLECTOMY    . TOTAL ABDOMINAL HYSTERECTOMY    . TOTAL KNEE ARTHROPLASTY  12/12/2012   Procedure: TOTAL KNEE ARTHROPLASTY;  Surgeon:  Alta Corning, MD;  Location: [facility];  Service: Orthopedics;  Laterality: Right;  right total knee arthroplasty     Family History  Problem Relation Age of Onset  . Heart disease Father   . Heart attack Mother   . Stomach cancer Brother   . Other Son        Triple [place]pass         Social History:  reports that she has never smoked. She has never used smokeless tobacco. She reports that she does not drink alcohol or use drugs.  Allergies:  Allergies  Allergen Reactions  . Crestor [Rosuvastatin] Other (See Comments)    Myalgia  . Darvon Other (See Comments)    Drop in  BP  . Meloxicam Other (See Comments)    Dizziness   . Meperidine Other (See Comments)    Hypotension  . Prednisone Diarrhea    POLYURIA  . Valsartan Other (See Comments)     Hypotension  . Amiodarone Other (See Comments)    Did not tolerate - unknown reaction  . Aspirin-Acetaminophen-Caffeine Other (See Comments)    Unknown reaction  . Nitroglycerin     Profound hypotension with sublingual nitroglycerin.   . Tramadol Other (See Comments)    Unknown reaction    Medications:                                                                                                                           No current facility-administered medications for this encounter.    Current Outpatient Medications  Medication Sig Dispense Refill  . amLODipine (NORVASC) 2.5 MG tablet Take 1 tablet (2.5 mg total) by mouth daily. 30 tablet 0  . diclofenac sodium (VOLTAREN) 1 % GEL Apply 2 g topically 4 (four) times daily. 1 Tube 1  . pravastatin (PRAVACHOL) 20 MG tablet Take 1 tablet (20 mg total) by mouth daily at 6 PM. (Patient not taking: Reported on 04/08/2018) 20 tablet 0  . Sennosides 15 MG CHEW Chew 1 tablet by mouth as needed (constipation).    . vitamin B-12 (CYANOCOBALAMIN) 1000 MCG tablet Take 1,000 mcg by mouth daily.    Alveda Reasons 15 MG TABS tablet TAKE 1 TABLET (15 MG TOTAL) BY MOUTH DAILY. STROKE PREVENTION, BLOOD THINNER  3     ROS:  ROS was performed and is negative except as noted in HPI   General Examination:                                                                                                      There were no vitals taken for this visit.  HEENT-  Normocephalic, no lesions, without obvious abnormality.  Normal external eye and conjunctiva.  Cardiovascular- S1-S2 audible, pulses palpable throughout  Lungs-no rhonchi or wheezing noted, no excessive working breathing.  Saturations within normal limits on RA Abdomen- All 4 quadrants palpated and nontender Extremities- Warm, dry and intact Musculoskeletal-no joint tenderness, deformity or swelling Skin-warm and dry, no hyperpigmentation, vitiligo, or suspicious lesions  Neurological Examination Mental Status: Alert, oriented, thought content appropriate.  Speech fluent without evidence of aphasia.  Able to follow commands without difficulty. Cranial Nerves: II: ; Visual fields grossly normal,  III,IV, VI: ptosis not present, extra-ocular motions intact bilaterally, pupils equal, round, reactive to light and  V,VII: smile symmetric, facial light touch sensation normal bilaterally VIII: hearing normal bilaterally IX,X: uvula rises symmetrically XI: bilateral shoulder shrug XII: midline tongue extension Motor: Right : Upper extremity   5/5 Left:     Upper extremity   5/5  Lower extremity   5/5  Lower extremity   4/5 Tone and bulk:normal tone throughout; no atrophy noted Sensory:  light touch intact throughout, bilaterally Deep Tendon Reflexes: 2+ and symmetric biceps and hyper reflexic in patella Plantars: Right: downgoing   Left: downgoing Cerebellar: normal finger-to-nose,  unable to perform HTS  With left leg d/t previous CVA Gait: deferred   Lab Results: Basic Metabolic Panel: Recent Labs  Lab 09/03/18 1710  NA 139  K 3.8  CL 106  GLUCOSE 95  BUN 17  CREATININE 1.10*    CBC: Recent Labs  Lab  09/03/18 1710  HGB 14.3  HCT 42.0    Imaging: Ct Head Code Stroke Wo Contrast  Result Date: 09/03/2018 CLINICAL DATA:  Code stroke.  Recent intracranial hemorrhage. EXAM: CT HEAD WITHOUT CONTRAST TECHNIQUE: Contiguous axial images were obtained from the base of the skull through the vertex without intravenous contrast. COMPARISON:  Head CT 08/30/2018 FINDINGS: Brain: No acute hemorrhage. Diffuse enlargement of the extra-axial CSF spaces, unchanged. No acute cortical infarct. Unchanged appearance of old left occipital infarct. Previously seen small volume subarachnoid hemorrhage is no longer visible. Vascular: Atherosclerotic calcification of the internal carotid arteries at the skull base. No abnormal hyperdensity of the major intracranial arteries or dural venous sinuses. Skull: The visualized skull base, calvarium and extracranial soft tissues are normal. Sinuses/Orbits: No fluid levels or advanced mucosal thickening of the visualized paranasal sinuses. No mastoid or middle ear effusion. The orbits are normal. ASPECTS [facility] Stroke Program Early CT Score) - Ganglionic level infarction (caudate, lentiform nuclei, internal capsule, insula, M1-M3 cortex): 7 - Supraganglionic infarction (M4-M6 cortex): 3 Total score (0-10 with 10 being normal): 10 IMPRESSION: 1. No acute hemorrhage. Resolution of previously identified small volume subarachnoid hemorrhage. 2. ASPECTS is 10. These results were communicated to Dr. Karena Addison Prithvi Kooi at 5:19 pm  on 09/03/2018 by text page via the [facility] messaging system. Electronically Signed   By: Ulyses Jarred M.D.   On: 09/03/2018 17:20      Laurey Morale, MSN, NP-C [facility] [phone]  09/03/2018, 5:09 PM   Attending physician note to follow with Assessment and plan .   Assessment: 84 y.o. female PMH significant for recent traumatic SAH ( 08/30/18), CVA ( 2013 left side deficits and 2017, HTN, HLD, DVT ( xarelto), CKD 3,  who presented to [facility] ED  as a code stroke for weakness and slurred speech. CT head: no hemorrhage. Not a TPA candidate d/t SAH. Unable to receive MRI d/t pacemaker. Repeat head CT tomorrow and further stroke work-up.   Impression: TIA vs seizure Stroke Risk Factors - hyperlipidemia and hypertension    Recommendations: -- BP goal : Permissive HTN upto 220/110 mmHg --repeat head CT --CTA --Echocardiogram -- ASA -- High intensity Statin if LDL > 70 -- HgbA1c, fasting lipid panel -- PT consult, OT consult, Speech consult --Telemetry monitoring --Frequent neuro checks --Stroke swallow screen  --EEG  --please page stroke NP  Or  PA  Or MD from 8am -4 pm  as this patient from this time will be  followed by the stroke.   You can look them up on [email or web address]  Password [email or web address]   NEUROHOSPITALIST ADDENDUM Performed a face to face diagnostic evaluation.   I have reviewed the contents of history and physical exam as documented by PA/ARNP/Resident and agree with above documentation.  I have discussed and formulated the above plan as documented. Edits to the note have been made as needed.  84 year old female stroke alerted for weakness and slurred speech.  Not a candidate for TPA due to recent hemorrhage.  Patient also has low NIH stroke scale.  Likely this was a TIA versus a seizure.  Admit to medicine team for further evaluation.  She cannot get MRI brain-can repeat CT head in 24 hours.     Karena Addison Briannie Gutierrez MD [facility] [phone]   If 7pm to 7am, please call on call as listed on AMION.

## 2018-09-03 NOTE — Code Documentation (Signed)
84 year old female presents to [facility] as code stroke which was called in the field.  Per EMS family states she was walking with her walker at 1400 - later at 1600 she was noted to have slurred speech and left side weakness.  On arrival she is alert but slow to respond only deficit is left leg weakness which she states is not new to her.  Records show she was just recently admitted at [facility] for a traumatic SAH - was on Xarelto at that time but since taken off.  CT scan done - no new blood - NIHSS remains 1 - Dr. Laurence Slate at bedside - no acute treatment.  Handoff to [employer].

## 2018-09-03 NOTE — ED Notes (Signed)
Pt states takes acetaminophen and has no allergy to it. Family also comfirmed

## 2018-09-03 NOTE — Progress Notes (Signed)
Son Sohana Lauster [phone]

## 2018-09-04 ENCOUNTER — Inpatient Hospital Stay (HOSPITAL_COMMUNITY): Payer: Medicare Other

## 2018-09-04 ENCOUNTER — Encounter (HOSPITAL_COMMUNITY): Payer: Self-pay | Admitting: Emergency Medicine

## 2018-09-04 ENCOUNTER — Other Ambulatory Visit: Payer: Self-pay

## 2018-09-04 DIAGNOSIS — G40209 Localization-related (focal) (partial) symptomatic epilepsy and epileptic syndromes with complex partial seizures, not intractable, without status epilepticus: Secondary | ICD-10-CM

## 2018-09-04 DIAGNOSIS — I609 Nontraumatic subarachnoid hemorrhage, unspecified: Secondary | ICD-10-CM

## 2018-09-04 DIAGNOSIS — R410 Disorientation, unspecified: Secondary | ICD-10-CM | POA: Diagnosis not present

## 2018-09-04 DIAGNOSIS — G9608 Other cranial cerebrospinal fluid leak: Secondary | ICD-10-CM

## 2018-09-04 DIAGNOSIS — I639 Cerebral infarction, unspecified: Secondary | ICD-10-CM

## 2018-09-04 DIAGNOSIS — Z950 Presence of cardiac pacemaker: Secondary | ICD-10-CM | POA: Diagnosis not present

## 2018-09-04 DIAGNOSIS — G960 Cerebrospinal fluid leak: Secondary | ICD-10-CM | POA: Diagnosis not present

## 2018-09-04 DIAGNOSIS — E785 Hyperlipidemia, unspecified: Secondary | ICD-10-CM

## 2018-09-04 LAB — RAPID URINE DRUG SCREEN, HOSP PERFORMED
AMPHETAMINES: NOT DETECTED
BARBITURATES: NOT DETECTED
BENZODIAZEPINES: NOT DETECTED
COCAINE: NOT DETECTED
OPIATES: NOT DETECTED
TETRAHYDROCANNABINOL: NOT DETECTED

## 2018-09-04 LAB — LIPID PANEL
CHOLESTEROL: 163 mg/dL (ref 0–200)
HDL: 42 mg/dL (ref >40)
LDL Cholesterol: 106 mg/dL — ABNORMAL HIGH (ref 0–99)
Total CHOL/HDL Ratio: 3.9 RATIO
Triglycerides: 77 mg/dL (ref <150)
VLDL: 15 mg/dL (ref 0–40)

## 2018-09-04 LAB — BASIC METABOLIC PANEL
Anion gap: 10 (ref 5–15)
BUN: 14 mg/dL (ref 8–23)
CALCIUM: 9.3 mg/dL (ref 8.9–10.3)
CO2: 22 mmol/L (ref 22–32)
Chloride: 106 mmol/L (ref 98–111)
Creatinine, Ser: 0.99 mg/dL (ref 0.44–1.00)
GFR calc Af Amer: 56 mL/min — ABNORMAL LOW (ref >60)
GFR, EST NON AFRICAN AMERICAN: 48 mL/min — AB (ref >60)
GLUCOSE: 95 mg/dL (ref 70–99)
Potassium: 3.7 mmol/L (ref 3.5–5.1)
Sodium: 138 mmol/L (ref 135–145)

## 2018-09-04 LAB — CBC
HCT: 40.7 % (ref 36.0–46.0)
Hemoglobin: 12.9 g/dL (ref 12.0–15.0)
MCH: 29.1 pg (ref 26.0–34.0)
MCHC: 31.7 g/dL (ref 30.0–36.0)
MCV: 91.7 fL (ref 80.0–100.0)
PLATELETS: 163 10*3/uL (ref 150–400)
RBC: 4.44 MIL/uL (ref 3.87–5.11)
RDW: 13.1 % (ref 11.5–15.5)
WBC: 4 10*3/uL (ref 4.0–10.5)
nRBC: 0 % (ref 0.0–0.2)

## 2018-09-04 LAB — HEMOGLOBIN A1C
HEMOGLOBIN A1C: 5.2 % (ref 4.8–5.6)
MEAN PLASMA GLUCOSE: 102.54 mg/dL

## 2018-09-04 LAB — GLUCOSE, CAPILLARY: Glucose-Capillary: 111 mg/dL — ABNORMAL HIGH (ref 70–99)

## 2018-09-04 MED ORDER — HYDROMORPHONE HCL 2 MG PO TABS: 2.0000 mg | ORAL_TABLET | ORAL | Status: DC | PRN

## 2018-09-04 MED ORDER — HYDROMORPHONE HCL 2 MG PO TABS: 1.0000 mg | ORAL_TABLET | ORAL | Status: DC | PRN

## 2018-09-04 NOTE — Evaluation (Signed)
Physical Therapy Evaluation Patient Details Name: [name] [name] MRN: [id number] DOB: 08-Jul-1924 Today's Date: 09/04/2018   History of Present Illness  [name] [name] is a 84 y.o. female presenting with AMS x1day . PMH is significant for HLD, CAD, HTN, Heart block w/ pacemaker, CKD III, paroxysmal atrial fibrillation, recent subarachnoid hemorrhage.  Presenting to ED on 09/03/18 with AMS. CT negatvie for acute abnormality. Workup for TIA vs seizure per neurology.     Clinical Impression  Pt presents with mild to moderate limitations to functional mobility compared with baseline.  Preexisting dependency on RW for stability with walking and standby assist for safety.  Pt has excellent support system and home setup for accessibility, ongoing [facility] services.  Recommend HHPT for mobility to improve activity sustainability and retain function to prevent fall or injury in home.  No further acute PT needs, recommend RN team use RW to assist with bathroom and OOB.      Follow Up Recommendations Home health PT    Equipment Recommendations  None recommended by PT    Recommendations for Other Services       Precautions / Restrictions Precautions Precautions: Fall Precaution Comments: up with assist, use RW Restrictions Weight Bearing Restrictions: No      Mobility  Bed Mobility Overal bed mobility: Needs Assistance Bed Mobility: Supine to Sit     Supine to sit: Min assist;HOB elevated Sit to supine: Mod assist;HOB elevated   General bed mobility comments: pt reaches for assistance to raise trunk to EOB, otherwise performs with increased time/effort  Transfers Overall transfer level: Needs assistance Equipment used: Rolling walker (2 wheeled);None Transfers: Sit to/from Stand Sit to Stand: Min assist         General transfer comment: assist for initial liftoff of surface (especially low like toilet), and cues for safest hand placement standing with RW; cues to turn around fully with  RW prior to sitting  Ambulation/Gait Ambulation/Gait assistance: Min guard Gait Distance (Feet): 20 Feet Assistive device: Rolling walker (2 wheeled) Gait Pattern/deviations: Shuffle;Decreased stride length;Narrow base of support;Trunk flexed Gait velocity: decr   General Gait Details: generally mildly unsteady, corrected by RW and hands on assist for safety, pt able to turn 180 with increase steps; maneuvers over threshold with incr effort but unassisted and no appreciable LOB.  Denies falls, slips, trips, and always uses devices  Stairs            Wheelchair Mobility    Modified Rankin (Stroke Patients Only) Modified Rankin (Stroke Patients Only) Pre-Morbid Rankin Score: Moderate disability Modified Rankin: Moderate disability     Balance Overall balance assessment: Needs assistance Sitting-balance support: Bilateral upper extremity supported;Feet supported Sitting balance-Leahy Scale: Fair     Standing balance support: Bilateral upper extremity supported;During functional activity Standing balance-Leahy Scale: Fair Standing balance comment: preference to UE support                             Pertinent Vitals/Pain Pain Assessment: No/denies pain    Home Living Family/patient expects to be discharged to:: Private residence Living Arrangements: Spouse/significant other;Other (Comment) Available Help at Discharge: Family;Available 24 hours/day;Personal care attendant Type of Home: House Home Access: Stairs to enter Entrance Stairs-Rails: Right Entrance Stairs-Number of Steps: 2 Home Layout: Two level;Able to live on main level with bedroom/bathroom Home Equipment: Gilford Rile - 2 wheels;Walker - 4 wheels;Bedside commode;Cane - single point;Shower seat;Grab bars - tub/shower Additional Comments: Pt has PCA who spends the  night to supervise husband who has PD. Will have initial 24/7 support from son. Very supportive family/community    Prior Function Level of  Independence: Independent with assistive device(s)         Comments: Use walker for commuinty mobility, cane at home; independent with ADLs, limited IADLs     Hand Dominance   Dominant Hand: Right    Extremity/Trunk Assessment   Upper Extremity Assessment Upper Extremity Assessment: Defer to OT evaluation LUE Deficits / Details: grossly 3+/5, weaker than R UE; functional  LUE Sensation: decreased light touch LUE Coordination: decreased fine motor;decreased gross motor    Lower Extremity Assessment Lower Extremity Assessment: Overall WFL for tasks assessed(mild residual weakness from previous stroke)       Communication   Communication: No difficulties  Cognition Arousal/Alertness: Awake/alert Behavior During Therapy: WFL for tasks assessed/performed Overall Cognitive Status: Within Functional Limits for tasks assessed Area of Impairment: Orientation;Attention;Memory;Following commands;Safety/judgement;Awareness;Problem solving                 Orientation Level: Disoriented to;Time Current Attention Level: Sustained Memory: Decreased recall of precautions;Decreased short-term memory Following Commands: Follows one step commands consistently;Follows one step commands with increased time Safety/Judgement: Decreased awareness of safety;Decreased awareness of deficits Awareness: Emergent Problem Solving: Slow processing;Decreased initiation;Difficulty sequencing;Requires verbal cues General Comments: pt presented with functional cognition, albeit slightly impulsive vs. stubborn (her word).  Judgment otherwise in tact and able to describe in detail home set up and plan for assist at [place] comments (skin integrity, edema, etc.): reports dyspnea (mild/unnoticable) and 'racing heart' -- pulseox reads >96% and VSS otherwise.  Recovers quickly.    Exercises     Assessment/Plan    PT Assessment All further PT needs can be met in the next venue  of care  PT Problem List Cardiopulmonary status limiting activity;Decreased mobility;Decreased balance;Decreased activity tolerance;Decreased strength       PT Treatment Interventions      PT Goals (Current goals can be found in the Care Plan section)  Acute Rehab PT Goals Patient Stated Goal: to get home  PT Goal Formulation: All assessment and education complete, DC therapy    Frequency     Barriers to discharge        Co-evaluation               AM-PAC PT "6 Clicks" Daily Activity  Outcome Measure Difficulty turning over in bed (including adjusting bedclothes, sheets and blankets)?: A Little Difficulty moving from lying on back to sitting on the side of the bed? : A Little Difficulty sitting down on and standing up from a chair with arms (e.g., wheelchair, bedside commode, etc,.)?: A Little Help needed moving to and from a bed to chair (including a wheelchair)?: A Little Help needed walking in hospital room?: A Little Help needed climbing 3-5 steps with a railing? : A Lot 6 Click Score: 17    End of Session   Activity Tolerance: Patient limited by fatigue Patient left: in chair;with call bell/phone within reach;with family/visitor present Nurse Communication: Mobility status PT Visit Diagnosis: Unsteadiness on feet (R26.81)    Time: 1201-1229 PT Time Calculation (min) (ACUTE ONLY): 28 min   Charges:   PT Evaluation $PT Eval Low Complexity: 1 Low PT Treatments $Therapeutic Activity: 8-22 mins        Kearney Hard, PT, DPT, MS Board Certified Geriatric Clinical Specialist  Herbie Drape 09/04/2018, 12:50 PM

## 2018-09-04 NOTE — Progress Notes (Addendum)
Family Medicine Teaching Service Daily Progress Note Intern Pager: [phone]  Patient name: [name] [name] Medical record number: [id number] Date of birth: 1924-05-07 Age: 84 y.o. Gender: female  Primary Care Provider: Olive Bass, MD Consultants: Neurology Code Status: full  Pt Overview and Major Events to Date:  10/26 admitted to [facility], neurology consulted, code stroke:   Assessment and Plan: [name] [name] is a 84 y.o. female presenting with AMS x1day . PMH is significant for HLD, CAD, HTN, Heart block w/ pacemaker, CKD III, paroxysmal atrial fibrillation   Altered mental status Patient now a&o x3.  Appears back to baseline.  Repeat CT scan showed no acute intracranial abnormality.  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  EEG still pending to evaluate for seizure-like activity.  Unlikely to be revealing given patient's improvement.  Neurology consulted appreciate the recommendations.  Echo pending.  PT OT recommendations pending. -Vital signs per floor routine -Follow-up echocardiogram -Follow-up the EEG -Follow-up neurology recommendations, appreciate their help -PT/OT eval pending -SCDs for DVT prophylaxis -Pravastatin 20 mg daily - Aspirin 325 mg daily -Regular diet  Recent subarachnoid hemorrhage Resolved on CT head x2.  Discharged on Keppra from [facility] secondary to bleed.  Unclear if still needs seizure prophylaxis given resolution of bleeding.  Will follow neurology recommendations.  Has outpatient follow-up with neurosurgery as scheduled.  Received aspirin x1. -SCDs for DVT prophylaxis -Continue Keppra 500 mg twice daily for now -Neurology following appreciate the recs -Follow-up EEG  Hyperlipidemia Pravastatin 20 mg listed as home medication.  Per patient reports only taking this for a long time.  Unclear benefit in the long-term for her 84 year old patient.  Cholesterol panel only significant for  LDL 106.  Will discuss the necessity of aspirin and pravastatin long-term with neurology prior to discharge. - continue pravastatin 20mg  daily for now  Hypertension And BP 155/72.  Intermittent hypertension overnight. Permissive hypertension still recommended per Neurology note.  Takes amlodipine 2.5 mg daily at home.  Holding for now. -Hold amlodipine 2.5 mg daily  Heart block with pacemaker placement/ afib Chads-Vasc score 8.  Initially on Xarelto but held due to subarachnoid hemorrhage.  SCDs for DVT prophylaxis. continue cardiac monitoring. -SCDs for DVT prophylaxis; hold anti-coag and setting of SAH -Telemetry  CHF Echo from 05/2018 showing EF of 45 to 50%.  Repeat echo pending.  Saline lock IV  Increased sleepiness/weakness Progressive weakness since discharge from outside hospital.  Apparently been sleeping around 20 hours/day and only waking for meals and to urinate. Her symptoms are most consistent with postconcussive syndrome after sustaining fall prompting her presentation to [facility].  Keppra could be contributing to symptoms.  FEN/GI: Regular diet PPx: SCDs  Disposition: Home versus SNF  Subjective:  Doing much better this morning.  A&O x3.  Tolerating diet well.  No complaints  Objective: Temp:  [97.4 F (36.3 C)-98.4 F (36.9 C)] 97.8 F (36.6 C) (10/27 0550) Pulse Rate:  [48-126] 117 (10/27 0550) Resp:  [15-29] 18 (10/27 0550) BP: (135-186)/(59-113) 155/72 (10/27 0550) SpO2:  [94 %-98 %] 94 % (10/27 0550) Weight:  [62.2 kg] 62.2 kg (10/27 0000) Physical Exam: General: Elderly Caucasian female, resting comfortably in bed, no acute acute distress  cardiovascular: Irregularly irregular, tachycardia.  Palpable peripheral pulses. Respiratory: Lungs clear to auscultation bilateral, no increased work of breathing Abdomen: Soft, nontender, nondistended Extremities: 5 out of 5 strength all muscle groups bilateral lower extremity bilateral upper extremity Neuro:  No  focal neurologic deficit, CN II to XII intact.  Laboratory: Recent Labs  Lab 09/03/18 1710 09/03/18 1716 09/04/18 0835  WBC  --  5.4 4.0  HGB 14.3 14.1 12.9  HCT 42.0 47.7* 40.7  PLT  --  133* 163   Recent Labs  Lab 09/03/18 1710 09/03/18 1716 09/04/18 0509  NA 139 137 138  K 3.8 3.9 3.7  CL 106 108 106  CO2  --  20* 22  BUN 17 15 14   CREATININE 1.10* 1.17* 0.99  CALCIUM  --  9.4 9.3  PROT  --  6.1*  --   BILITOT  --  1.1  --   ALKPHOS  --  42  --   ALT  --  12  --   AST  --  27  --   GLUCOSE 95 94 95    Imaging/Diagnostic Tests: CLINICAL DATA:  84 year old female status post fall earlier this month with trace subarachnoid hemorrhage. Stroke-like symptoms.  EXAM: CT HEAD WITHOUT CONTRAST  TECHNIQUE: Contiguous axial images were obtained from the base of the skull through the vertex without intravenous contrast.  COMPARISON:  Head CTs 09/03/2018, 08/30/2018, and earlier.  FINDINGS: Brain: Stable cerebral volume. 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 Compare series 5, image 28 today to series 5, image 33 on 12/31/2017.  No subdural space blood is identified. There is no associated midline shift. Minimal if any mass effect on both hemispheres. Basilar cisterns remain normal.  No cortically based acute infarct identified. Chronic left PCA territory encephalomalacia. Stable gray-white matter differentiation throughout the brain.  Vascular: Calcified atherosclerosis at the skull base. No suspicious intracranial vascular hyperdensity.  Skull: Negative.  Sinuses/Orbits: Visualized paranasal sinuses and mastoids are stable and well pneumatized.  Other: Orbit and scalp soft tissues appear stable and negative.  IMPRESSION: 1.  No acute  intracranial abnormality. 2. 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 3. Chronic left PCA encephalomalacia. No new intracranial ischemia is evident.  Myrene Buddy, MD 09/04/2018, 10:15 AM PGY-2, [facility] FPTS Intern pager: [phone], text pages welcome

## 2018-09-04 NOTE — Evaluation (Signed)
Speech Language Pathology Evaluation Patient Details Name: [name] [name] MRN: [id number] DOB: 03-Aug-1924 Today's Date: 09/04/2018 Time: 1400-1420 SLP Time Calculation (min) (ACUTE ONLY): 20 min  Problem List:  Patient Active Problem List   Diagnosis Date Noted  . Subarachnoid hemorrhage 09/04/2018  . Subdural hygroma   . Dyslipidemia   . AMS (altered mental status) 09/03/2018  . Chest pain 05/18/2018  . Chronic anticoagulation 02/11/2018  . Orthostatic hypotension 02/11/2018  . Chronic diastolic heart failure ([place]) 02/11/2018  . AKI (acute kidney injury) ([place]) 01/01/2018  . Syncope and collapse 01/01/2018  . History of DVT (deep vein thrombosis) 11/30/2017  . Weakness generalized 11/25/2017  . Malaise and fatigue 10/27/2017  . Dyspnea on exertion 10/12/2017  . Post-dural puncture headache 09/01/2017  . Lumbar radiculopathy 07/27/2017  . Closed fracture of ramus of left pubis ([place]) 06/18/2017  . Pain of left femur 06/17/2017  . Hammer toe of left foot 06/07/2017  . Left foot pain 06/07/2017  . S/P left knee arthroscopy 04/01/2017  . Closed compression fracture of fourth lumbar vertebra ([place]) 01/19/2017  . Hemiparesis of left nondominant side as late effect of cerebral infarction ([place]) 12/22/2016  . Chronic pain of left knee 12/07/2016  . S/P TKR (total knee replacement) using cement, right 12/07/2016  . Left leg pain 08/03/2016  . Osteoporosis 08/01/2016  . Pulmonary fibrosis ([place]) 08/01/2016  . Vitamin D deficiency 08/01/2016  . Paroxysmal atrial fibrillation ([place]) 02/08/2016  . Cerebrovascular accident (CVA) due to thrombosis of right middle cerebral artery ([place]) 02/07/2016  . Cardiac pacemaker in situ 01/16/2016  . Hypertensive heart disease 01/15/2016  . Chronic kidney disease, stage III (moderate) ([place]) 01/15/2016  . Complete atrioventricular block ([place]) 01/15/2016  . Panlobular emphysema ([place]) 01/15/2016  . Paroxysmal supraventricular tachycardia ([place]) 01/15/2016   . Chest pain with low risk for cardiac etiology 11/18/2015  . Bradycardia with 31 - 40 beats per minute 05/14/2015  . Heart block   . Pain in limb 08/16/2013  . Varicose veins of lower extremities with other complications 53/66/4403  . Edema -lower extremity 06/22/2013  . Osteoarthritis of right knee 12/12/2012  . History of pulmonary embolism 08/30/2012  . Fatigue 08/19/2012  . Wide-complex tachycardia ([place]) 05/30/2012  . Bronchiectasis, non-tuberculous ([place]) 01/06/2012  . 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 Diverticulosis   . High cholesterol   . Coronary artery disease prior MI    Past Medical History:  Past Medical History:  Diagnosis Date  . Acute deep vein thrombosis (DVT) of right peroneal vein 11/30/2017   Overview:  4742: uncomplicated, xarelto  . AKI (acute kidney injury) ([place]) 01/01/2018  . Arthritis   . Atrial fibrillation ([place]) 02/08/2016  . Benign essential HTN 01/15/2016  . Bradycardia with 31 - 40 beats per minute 05/14/2015  . Bronchiectasis, non-tuberculous ([place])    a. Followed by Dr. Joya Gaskins.  . Cerebrovascular accident (CVA) due to thrombosis of right middle cerebral artery ([place]) 02/07/2016   Overview:  2017: R-MCA thrombosis with left non-dom hemiparesis, thalamic capsule ischemia 2018: recurrent  . Chest pain with low risk for cardiac etiology 11/18/2015  . Chronic kidney disease, stage III (moderate) ([place]) 01/15/2016  . Chronic pain of left knee 12/07/2016  . Closed compression fracture of fourth lumbar vertebra ([place]) 01/19/2017   Overview:  2015  . Closed fracture of ramus of left pubis ([place]) 06/18/2017   Overview:  2018  . Colon polyps   . Complete atrioventricular block ([place]) 01/15/2016   Overview:  2016: PM  .  Complete heart block (HCC)    a. s/p MDT dual chamber PPM   . COPD (chronic obstructive pulmonary disease) ([place])    past hx with pneumonia  . Coronary artery disease prior MI   . Diverticulosis   . DVT (deep venous thrombosis)  (HCC)    Resolved, no anticoagulation currently  . Dyspnea on exertion 10/12/2017   Overview:  2018  . Edema -lower extremity 06/22/2013  . Fatigue 08/19/2012  . GERD (gastroesophageal reflux disease)   . Hammer toe of left foot 06/07/2017  . Headache   . Heart block   . Hemiparesis of left nondominant side as late effect of cerebral infarction ([place]) 12/22/2016  . High cholesterol   . History of pulmonary embolism 08/30/2012   PE 2013  >>repeat CT Angio chest 12/2013>>>NO PE   . Hypertension   . IBS (irritable bowel syndrome)   . Left foot pain 06/07/2017  . Left leg pain 08/03/2016   Overview:  2017: related to weakness left leg from stroke  . Lumbar radiculopathy 07/27/2017  . 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 Malaise and fatigue 10/27/2017  . MI (myocardial infarction) ?? probably not 1975   a. 1975 Reported MI;  b. 2011 Cath: nonobs dzs;  c. 2013 Nonischemic Myoview;  d. 12/2012 Echo: EF 60-65%, Gr1 DD.  . Nephrolithiasis   . Orthostatic syncope 01/01/2018  . Osteoarthritis of right knee 12/12/2012  . Osteoporosis 08/01/2016  . Pain in limb 08/16/2013  . Pain of left femur 06/17/2017   Overview:  2018  . Palpitation    a. 2010 Event Monitor: PACs and PVCs  . Panlobular emphysema ([place]) 01/15/2016  . Paroxysmal supraventricular tachycardia ([place]) 01/15/2016  . Pneumonia   . PONV (postoperative nausea and vomiting)   . Post-dural puncture headache 09/01/2017  . Presence of cardiac pacemaker 01/16/2016   July 5,2016 Overview:  2016: complete AVB  . Presence of permanent cardiac pacemaker   . Pulmonary fibrosis ([place])   . S/P left knee arthroscopy 04/01/2017  . S/P TKR (total knee replacement) using cement, right 12/07/2016  . Stroke ([place])   . Varicose veins of lower extremities with other complications 40/07/8118  . Vitamin D deficiency 08/01/2016  . Weakness generalized 11/25/2017  . Wears dentures    full  . Wide-complex tachycardia ([place])    a. Followed by Dr. Caryl Comes,  "I think this is VT, but i am not sure QRSd 135 1:1 AV."   Past Surgical History:  Past Surgical History:  Procedure Laterality Date  . APPENDECTOMY    . BLADDER SUSPENSION     tack  . CARDIAC CATHETERIZATION Right 05/14/2015   Procedure: Temporary Pacemaker;  Surgeon: Thompson Grayer, MD;  Location: [facility];  Service: Cardiovascular;  Laterality: Right;  . CHOLECYSTECTOMY    . COLON SURGERY     12inches colectomy s/p diverticulitis  . COLONOSCOPY W/ BIOPSIES AND POLYPECTOMY    . EP IMPLANTABLE DEVICE N/A 05/14/2015   Procedure: Pacemaker Implant;  Surgeon: Thompson Grayer, MD; [place] [facility] (serial number [id number])     . IR FL GUIDED LOC OF NEEDLE/CATH TIP FOR SPINAL INJECTION LT  09/06/2017  . KNEE ARTHROSCOPY Left 03/29/2017   Procedure: ARTHROSCOPY LEFT KNEE WITH PARTIAL MEDIAL AND PARTIAL LATERAL MENISCECTOMY;  Surgeon: Vickey Huger, MD;  Location: [facility];  Service: Orthopedics;  Laterality: Left;  Marland Kitchen MULTIPLE TOOTH EXTRACTIONS    . TONSILLECTOMY    . TOTAL ABDOMINAL HYSTERECTOMY    .  TOTAL KNEE ARTHROPLASTY  12/12/2012   Procedure: TOTAL KNEE ARTHROPLASTY;  Surgeon: Alta Corning, MD;  Location: [facility];  Service: Orthopedics;  Laterality: Right;  right total knee arthroplasty   HPI:  ARABELA BASALDUA is a 84 y.o. female presenting with AMS. Medical history to include a recent SAH is significant for HLD, CAD, HTN, Heart block w/ pacemaker, CKD III, paroxysmal atrial fibrillation.   CT negatvie for acute abnormality. Workup for TIA vs seizure per neurology. No history of swallowing problems.   Assessment / Plan / Recommendation Clinical Impression  Patient's husband and son present at beginning of evaluation to answer baseline questions. Both felt she her cognitive and language skills are back to baseline from her AMS yesterday. At baseline, she has mild dysarthria from right sided facial impairments from previous [place]. Patient is [name] for memory, expressive and  receptive language, reasoning and cognitive task given. No recommendations for speech therapy. Signing off.     SLP Assessment  SLP Recommendation/Assessment: Patient does not need any further Speech Lanaguage Pathology Services SLP Visit Diagnosis: Cognitive communication deficit (R41.841)    Follow Up Recommendations  None               SLP Evaluation Cognition  Overall Cognitive Status: Within Functional Limits for tasks assessed Arousal/Alertness: Awake/alert Orientation Level: Oriented X4 Attention: Focused Focused Attention: Appears intact Memory: Appears intact Awareness: Appears intact Problem Solving: Appears intact Executive Function: Reasoning;Sequencing Reasoning: Appears intact Sequencing: Appears intact Safety/Judgment: Appears intact       Comprehension  Auditory Comprehension Overall Auditory Comprehension: Appears within functional limits for tasks assessed Yes/No Questions: Within Functional Limits Commands: Within Functional Limits Conversation: Complex Visual Recognition/Discrimination Discrimination: Within Function Limits Reading Comprehension Reading Status: Within funtional limits    Expression Expression Primary Mode of Expression: Verbal Verbal Expression Level of Generative/Spontaneous Verbalization: Conversation Repetition: No impairment Naming: No impairment Written Expression Dominant Hand: Right Written Expression: Within Functional Limits   Oral / Motor  Oral Motor/Sensory Function Overall Oral Motor/Sensory Function: Mild impairment(Patient is slightly dysarthric at baseline, prior SAH) Facial ROM: Reduced right(baseline) Facial Symmetry: Abnormal symmetry right Facial Strength: Reduced right Facial Sensation: Within Functional Limits Lingual ROM: Reduced right Lingual Symmetry: Abnormal symmetry right Lingual Strength: Reduced Lingual Sensation: Within Functional Limits Motor Speech Overall Motor Speech: Appears within  functional limits for tasks assessed Respiration: Within functional limits Phonation: Normal Resonance: Within functional limits Articulation: Within functional limitis Intelligibility: Intelligible Word: 75-100% accurate Phrase: 75-100% accurate Sentence: 75-100% accurate Conversation: 75-100% accurate Motor Planning: Witnin functional limits Motor Speech Errors: Not applicable Interfering Components: Premorbid status(slightly dysarthric, but patient speaks slowly) Effective Techniques: Slow rate   GO                    Charlynne Cousins Eriyana Sweeten, MA, CCC-SLP 09/04/2018 3:03 PM

## 2018-09-04 NOTE — Evaluation (Addendum)
Occupational Therapy Evaluation Patient Details Name: [name] [name] MRN: [id number] DOB: 03-Jul-1924 Today's Date: 09/04/2018    History of Present Illness [name] [name] is a 84 y.o. female presenting with AMS x1day . PMH is significant for HLD, CAD, HTN, Heart block w/ pacemaker, CKD III, paroxysmal atrial fibrillation, recent subarachnoid hemorrhage.  Presenting to ED on 09/03/18 with AMS. CT negatvie for acute abnormality. Workup for TIA vs seizure per neurology.    Clinical Impression   PTA patient reports she was independent with ADLs, limited IADLs and used RW/cane for mobility. She was admitted for above and limited by below (see problem list). PT disoriented to time and requires cueing for safety awareness and problem solving. Pt has history of L sided weakness, but reports it feels weaker than normal; therapist noted slight dysmetria.  Pt currently requires min to mod assist for transfers, min assist for grooming, setup assist for UB ADL and mod assist for LB ADLs. Pt becomes SOB upon exertion (mobility to bathroom), but oxygen saturations maintained >96%.  Pts son arrives at completion of session and reports she will have 24/7 support from family at dc.   Believe patient will best benefit from continued OT services while admitted and follow up with [facility] services at discharge.  Will continue to follow while admitted.     Follow Up Recommendations  Home health OT;Supervision/Assistance - 24 hour    Equipment Recommendations  None recommended by OT    Recommendations for Other Services PT consult     Precautions / Restrictions Precautions Precautions: Fall Restrictions Weight Bearing Restrictions: No      Mobility Bed Mobility Overal bed mobility: Needs Assistance Bed Mobility: Supine to Sit;Sit to Supine     Supine to sit: Min assist;HOB elevated Sit to supine: Mod assist;HOB elevated   General bed mobility comments: min assist for trunk support to sit, B LEs support to  transition back to supine   Transfers Overall transfer level: Needs assistance Equipment used: Rolling walker (2 wheeled);None Transfers: Sit to/from Stand Sit to Stand: Mod assist;Min assist         General transfer comment: mod assist to stand from EOB and min assist from commode using grabbars, cueing for hand placement and safety    Balance Overall balance assessment: Needs assistance Sitting-balance support: No upper extremity supported;Feet supported Sitting balance-Leahy Scale: Fair     Standing balance support: No upper extremity supported;During functional activity Standing balance-Leahy Scale: Fair Standing balance comment: preference to UE support                           ADL either performed or assessed with clinical judgement   ADL Overall ADL's : Needs assistance/impaired     Grooming: Minimal assistance;Standing   Upper Body Bathing: Set up;Sitting   Lower Body Bathing: Moderate assistance;Sit to/from stand   Upper Body Dressing : Minimal assistance;Sitting   Lower Body Dressing: Moderate assistance;Sit to/from stand Lower Body Dressing Details (indicate cue type and reason): reliant on B UE support in standing  Toilet Transfer: Minimal assistance;Ambulation;Regular Toilet;Grab bars;RW Statistician Details (indicate cue type and reason): cueing for hand placement and sequencing  Toileting- Clothing Manipulation and Hygiene: Minimal assistance;Sit to/from stand       Functional mobility during ADLs: Minimal assistance;Rolling walker;Cueing for safety General ADL Comments: pt limited by cognition, safety and decreaed activity tolerance      Vision Baseline Vision/History: No visual deficits Patient Visual Report: No  change from baseline Vision Assessment?: Yes Eye Alignment: Within Functional Limits Ocular Range of Motion: Within Functional Limits Alignment/Gaze Preference: Within Defined Limits Tracking/Visual Pursuits: Unable to  hold eye position out of midline     Perception     Praxis      Pertinent Vitals/Pain Pain Assessment: No/denies pain     Hand Dominance Right   Extremity/Trunk Assessment Upper Extremity Assessment Upper Extremity Assessment: Generalized weakness;LUE deficits/detail LUE Deficits / Details: grossly 3+/5, weaker than R UE; functional  LUE Sensation: decreased light touch LUE Coordination: decreased fine motor;decreased gross motor   Lower Extremity Assessment Lower Extremity Assessment: Defer to PT evaluation       Communication Communication Communication: No difficulties   Cognition Arousal/Alertness: Awake/alert Behavior During Therapy: WFL for tasks assessed/performed Overall Cognitive Status: Impaired/Different from baseline Area of Impairment: Orientation;Attention;Memory;Following commands;Safety/judgement;Awareness;Problem solving                 Orientation Level: Disoriented to;Time Current Attention Level: Sustained Memory: Decreased recall of precautions;Decreased short-term memory Following Commands: Follows one step commands consistently;Follows one step commands with increased time Safety/Judgement: Decreased awareness of safety;Decreased awareness of deficits Awareness: Emergent Problem Solving: Slow processing;Decreased initiation;Difficulty sequencing;Requires verbal cues     General Comments  pt SOB with activity, oxygen saturations maintained >96% on RA    Exercises     Shoulder Instructions      Home Living Family/patient expects to be discharged to:: Private residence Living Arrangements: Spouse/significant other;Other (Comment) Available Help at Discharge: Family;Available 24 hours/day;Personal care attendant(PCA at night; children providing 24/7 care ) Type of Home: House Home Access: Stairs to enter [employer] of Steps: 2 Entrance Stairs-Rails: Right Home Layout: Two level;Able to live on main level with  bedroom/bathroom     Bathroom Shower/Tub: Walk-in shower   Bathroom Toilet: Handicapped height     Home Equipment: Environmental consultant - 2 wheels;Walker - 4 wheels;Bedside commode;Cane - single point;Shower seat;Grab bars - tub/shower          Prior Functioning/Environment Level of Independence: Independent with assistive device(s)        Comments: Use walker for commuinty mobility, cane at home; independent with ADLs, limited IADLs        OT Problem List: Decreased strength;Decreased activity tolerance;Decreased range of motion;Impaired balance (sitting and/or standing);Decreased coordination;Impaired vision/perception;Decreased cognition;Decreased safety awareness;Decreased knowledge of use of DME or AE;Decreased knowledge of precautions;Impaired sensation      OT Treatment/Interventions: Self-care/ADL training;Therapeutic exercise;Neuromuscular education;Energy conservation;DME and/or AE instruction;Therapeutic activities;Cognitive remediation/compensation;Visual/perceptual remediation/compensation;Patient/family education;Balance training    OT Goals(Current goals can be found in the care plan section) Acute Rehab OT Goals Patient Stated Goal: to get home  OT Goal Formulation: With patient Time For Goal Achievement: 09/18/18 Potential to Achieve Goals: Good  OT Frequency: Min 2X/week   Barriers to D/C:            Co-evaluation              AM-PAC PT "6 Clicks" Daily Activity     Outcome Measure Help from another person eating meals?: None Help from another person taking care of personal grooming?: A Little Help from another person toileting, which includes using toliet, bedpan, or urinal?: A Little Help from another person bathing (including washing, rinsing, drying)?: A Lot Help from another person to put on and taking off regular upper body clothing?: A Little Help from another person to put on and taking off regular lower body clothing?: A Lot 6 Click Score: 17  End of Session Equipment Utilized During Treatment: Gait belt;Rolling walker Nurse Communication: Mobility status  Activity Tolerance: Patient tolerated treatment well Patient left: in bed;with call bell/phone within reach;with bed alarm set;with family/visitor present  OT Visit Diagnosis: Other abnormalities of gait and mobility (R26.89);Muscle weakness (generalized) (M62.81);Other symptoms and signs involving cognitive function;Unsteadiness on feet (R26.81)                Time: 2334-3568 OT Time Calculation (min): 30 min Charges:  OT General Charges $OT Visit: 1 Visit OT Evaluation $OT Eval Moderate Complexity: 1 Mod OT Treatments $Self Care/Home Management : 8-22 mins  Chancy Milroy, OT Acute Rehabilitation Services Pager [phone] Office [phone]   Chancy Milroy 09/04/2018, 12:26 PM

## 2018-09-04 NOTE — Progress Notes (Signed)
STROKE TEAM PROGRESS NOTE   HISTORY OF PRESENT ILLNESS (per record) [name] [name] is an 84 y.o. female PMH significant for recent traumatic SAH ( 08/30/18), CVA ( 2013 left side deficits and 2017, HTN, HLD, DVT ( xarelto), CKD 3,  who presented to [facility] after an episode of staring into space, unresponsive. She was recently admitted to [facility] after acute onset left-sided weakness and a fall, she has bruising on her right face and right hip. She has baseline left-sided weakness from prior stroke. CT of the head was negative. She was started on keppra 500mg  bid.   ED course:  CT head: no hemorrhage. 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 She had an episode of eyes open, unresponsive. CT of the head without acute etiology. Stable hygromas. She is feeling better today. Likely seizure. Less likely TIA or stroke.     OBJECTIVE Vitals:   09/04/18 0000 09/04/18 0200 09/04/18 0345 09/04/18 0550  BP: (!) 159/71 (!) 144/70 135/62 (!) 155/72  Pulse: (!) 126 (!) 115 69 (!) 117  Resp: 18 16 16 18   Temp: (!) 97.4 F (36.3 C) 98 F (36.7 C) 98.4 F (36.9 C) 97.8 F (36.6 C)  TempSrc: Oral Oral Oral Oral  SpO2: 94% 94% 94% 94%  Weight: 62.2 kg     Height: 5\' 6"  (1.676 m)       CBC:  Recent Labs  Lab 09/03/18 1710 09/03/18 1716  WBC  --  5.4  NEUTROABS  --  3.0  HGB 14.3 14.1  HCT 42.0 47.7*  MCV  --  101.9*  PLT  --  133*    Basic Metabolic Panel:  Recent Labs  Lab 09/03/18 1716 09/04/18 0509  NA 137 138  K 3.9 3.7  CL 108 106  CO2 20* 22  GLUCOSE 94 95  BUN 15 14  CREATININE 1.17* 0.99  CALCIUM 9.4 9.3    Lipid Panel:     Component Value Date/Time   CHOL 163 09/04/2018 0509    TRIG 77 09/04/2018 0509   HDL 42 09/04/2018 0509   CHOLHDL 3.9 09/04/2018 0509   VLDL 15 09/04/2018 0509   LDLCALC 106 (H) 09/04/2018 0509   HgbA1c:  Lab Results  Component Value Date   HGBA1C 5.2 09/04/2018   Urine Drug Screen:     Component Value Date/Time   LABOPIA NONE DETECTED 09/04/2018 0722   COCAINSCRNUR NONE DETECTED 09/04/2018 0722   LABBENZ NONE DETECTED 09/04/2018 0722   AMPHETMU NONE DETECTED 09/04/2018 0722   THCU NONE DETECTED 09/04/2018 0722   LABBARB NONE DETECTED 09/04/2018 0722    Alcohol Level     Component Value Date/Time   [facility] <10 09/03/2018 2213    IMAGING   Dg Chest Port 1 View 09/03/2018 IMPRESSION:  No acute cardiopulmonary disease.     Ct Head Code Stroke Wo Contrast 09/03/2018  IMPRESSION:  1. No acute hemorrhage. Resolution of previously identified small volume subarachnoid hemorrhage.  2. ASPECTS is 10.    Transthoracic Echocardiogram - pending 00/00/00    Bilateral Carotid Dopplers - pending 00/00/00     PHYSICAL EXAM Blood pressure (!) 155/72, pulse (!) 117, temperature 97.8 F (36.6 C), temperature source  Oral, resp. rate 18, height 5\' 6"  (1.676 m), weight 62.2 kg, SpO2 94 %.   Physical exam: Exam: Gen: NAD, conversant, Alert CV: RRR, no MRG. No Carotid Bruits. No peripheral edema, warm, nontender Eyes: Conjunctivae clear without exudates or hemorrhage  Neuro: Detailed Neurologic Exam  Speech:    Speech is normal; fluent and spontaneous with normal comprehension.  Cognition:    The patient is oriented to person, month thinks it is 2000  Cranial Nerves:    The pupils are equal, round, and reactive to light. The fundi are normal and spontaneous venous pulsations are present. Extraocular movements are intact. Trigeminal sensation is intact and the muscles of mastication are normal. The face is symmetric. The palate elevates in the midline. Hearing intact. Voice is normal. Shoulder shrug is normal. The tongue  has normal motion without fasciculations.   Coordination:    N0 dysmetria  Motor Observation:    no involuntary movements noted. Tone:    Decreased muscle tone.    Posture:    Posture is normal. normal erect    Strength:    Strength is antigravity and equal     Sensation: intact to LT     Reflex Exam:  DTR's:    Deep tendon reflexes in the upper and lower extremities are symmetrical bilaterally.   Toes:    The toes are equivbilaterally.   Clonus:    Clonus is absent.       ASSESSMENT/PLAN [name] is a 84 y.o. female with history of recent fall SAH [facility] 08/30/18), CVAs ( 2013 left side deficits and 2017, HTN, HLD, DVT ( xarelto), Hx of PE, Hx of WCT (possible VT), Afib hx, PPM, and CKD 3, presenting with alteration of awareness, staring and unresponsive. Recently seen at [facility] and started on Keppra.  She did not receive IV t-PA due to recent [facility].  Likely seizure: Continue Keppra, follow up outpatient. Stroke team will sign off.   Resultant  Resolution of symptoms  CT head - no acute findings. Resolution of previously identified small volume SAH.  EEG pending. However regardless of outcome (epileptiform activity or normal) continue keppra.  MRI head - Pacemaker  MRA head - Pacemaker  Carotid Doppler - pending  2D Echo - pending  LDL - 106  HgbA1c - 5.2  VTE prophylaxis - SCDs  Diet - regular  No antithrombotic prior to admission, now on aspirin 325 mg daily. Continue ASA 81mg  outpatient.  Patient counseled to be compliant with her antithrombotic medications  Ongoing aggressive stroke risk factor management  Therapy recommendations:  pending  Disposition:  Pending  Hypertension  Stable . BP goal normotensive  Hyperlipidemia  Lipid lowering medication PTA:  Pravachol 20 mg daily  LDL 106, goal < 70  Current lipid lowering medication: Pravachol 20 mg - consider increasing to 40 mg daily  Continue statin at  discharge (Crestor intolerance - myalgias)   Other Stroke Risk Factors  Advanced age  Hx stroke/TIA  Hx of afib  Other Active Problems  PPM  DVT - had been on Xarelto (Hx of PE) - consider repeating LE dopplers.  Hx of Afib  CKD hx - Creat 0.99   Hospital day # 1  Patient recently started on Keppra. This incident more likely Seizure given Bilateral (stabe) hygromas and old left PCA encephalomalacia which may be seizure foci. Stroke will sign off, please reconsult if ongoing workup reveals any other etiology. Continue keppra, follow up with neurology outpatient.  To contact Stroke Continuity provider, please refer to [email or web address] After hours, contact General Neurology

## 2018-09-05 ENCOUNTER — Inpatient Hospital Stay (HOSPITAL_COMMUNITY): Payer: Medicare Other

## 2018-09-05 ENCOUNTER — Encounter (HOSPITAL_COMMUNITY): Payer: Self-pay | Admitting: Cardiology

## 2018-09-05 DIAGNOSIS — R410 Disorientation, unspecified: Secondary | ICD-10-CM | POA: Diagnosis not present

## 2018-09-05 DIAGNOSIS — I5021 Acute systolic (congestive) heart failure: Secondary | ICD-10-CM

## 2018-09-05 DIAGNOSIS — I6789 Other cerebrovascular disease: Secondary | ICD-10-CM

## 2018-09-05 DIAGNOSIS — I609 Nontraumatic subarachnoid hemorrhage, unspecified: Secondary | ICD-10-CM | POA: Diagnosis not present

## 2018-09-05 DIAGNOSIS — R4701 Aphasia: Secondary | ICD-10-CM | POA: Diagnosis not present

## 2018-09-05 DIAGNOSIS — E785 Hyperlipidemia, unspecified: Secondary | ICD-10-CM | POA: Diagnosis not present

## 2018-09-05 DIAGNOSIS — Z950 Presence of cardiac pacemaker: Secondary | ICD-10-CM | POA: Diagnosis not present

## 2018-09-05 LAB — CBC
HEMATOCRIT: 41.3 % (ref 36.0–46.0)
Hemoglobin: 13.4 g/dL (ref 12.0–15.0)
MCH: 29.5 pg (ref 26.0–34.0)
MCHC: 32.4 g/dL (ref 30.0–36.0)
MCV: 90.8 fL (ref 80.0–100.0)
NRBC: 0 % (ref 0.0–0.2)
PLATELETS: 165 10*3/uL (ref 150–400)
RBC: 4.55 MIL/uL (ref 3.87–5.11)
RDW: 13 % (ref 11.5–15.5)
WBC: 4.6 10*3/uL (ref 4.0–10.5)

## 2018-09-05 LAB — BASIC METABOLIC PANEL
Anion gap: 7 (ref 5–15)
BUN: 14 mg/dL (ref 8–23)
CALCIUM: 9.3 mg/dL (ref 8.9–10.3)
CHLORIDE: 108 mmol/L (ref 98–111)
CO2: 22 mmol/L (ref 22–32)
Creatinine, Ser: 1.01 mg/dL — ABNORMAL HIGH (ref 0.44–1.00)
GFR calc non Af Amer: 47 mL/min — ABNORMAL LOW (ref >60)
GFR, EST AFRICAN AMERICAN: 54 mL/min — AB (ref >60)
Glucose, Bld: 110 mg/dL — ABNORMAL HIGH (ref 70–99)
Potassium: 3.9 mmol/L (ref 3.5–5.1)
SODIUM: 137 mmol/L (ref 135–145)

## 2018-09-05 LAB — GLUCOSE, CAPILLARY: Glucose-Capillary: 87 mg/dL (ref 70–99)

## 2018-09-05 LAB — ECHOCARDIOGRAM COMPLETE
Height: 66 in
Weight: 2194.02 oz

## 2018-09-05 MED ORDER — ENSURE ENLIVE PO LIQD
237.0000 mL | Freq: Two times a day (BID) | ORAL | Status: DC
  Administered 2018-09-05 – 2018-09-06 (×2): 237 mL via ORAL

## 2018-09-05 MED ORDER — AMLODIPINE BESYLATE 2.5 MG PO TABS
2.5000 mg | ORAL_TABLET | Freq: Every day | ORAL | Status: DC
  Administered 2018-09-05: 2.5 mg via ORAL
  Filled 2018-09-05: qty 1

## 2018-09-05 MED ORDER — PERFLUTREN LIPID MICROSPHERE
1.0000 mL | INTRAVENOUS | Status: AC | PRN
  Administered 2018-09-05: 2 mL via INTRAVENOUS
  Filled 2018-09-05: qty 10

## 2018-09-05 NOTE — Progress Notes (Signed)
Occupational Therapy Treatment Patient Details Name: [name] [name] MRN: [id number] DOB: 05/27/1924 Today's Date: 09/05/2018    History of present illness [name] [name] is a 84 y.o. female presenting with AMS x1day . PMH is significant for HLD, CAD, HTN, Heart block w/ pacemaker, CKD III, paroxysmal atrial fibrillation, recent subarachnoid hemorrhage.  Presenting to ED on 09/03/18 with AMS. CT negatvie for acute abnormality. Workup for TIA vs seizure per neurology.    OT comments  Patient supine in bed and willing to participate in OT.  Completes bed mobility with supervision, transfers with min assist (increased time and effort to ascend today, multiple trials from EOB), standing grooming with min assist given moderate verbal cueing for erect posture.  Noted increased lethargy today.  Will continue to follow while admitted.    Follow Up Recommendations  Home health OT;Supervision/Assistance - 24 hour    Equipment Recommendations  None recommended by OT    Recommendations for Other Services PT consult    Precautions / Restrictions Precautions Precautions: Fall Restrictions Weight Bearing Restrictions: No       Mobility Bed Mobility Overal bed mobility: Needs Assistance Bed Mobility: Supine to Sit     Supine to sit: Supervision;HOB elevated     General bed mobility comments: supervision given increased time  Transfers Overall transfer level: Needs assistance Equipment used: Rolling walker (2 wheeled);None Transfers: Sit to/from Stand Sit to Stand: Min assist         General transfer comment: patient requires multiple attempts to stand from EOB, min assist for safety and balance; cueing for hand placement and safety    Balance Overall balance assessment: Needs assistance Sitting-balance support: No upper extremity supported;Feet supported Sitting balance-Leahy Scale: Good     Standing balance support: During functional activity;No upper extremity  supported Standing balance-Leahy Scale: Fair Standing balance comment: preference to UE support                           ADL either performed or assessed with clinical judgement   ADL Overall ADL's : Needs assistance/impaired     Grooming: Minimal assistance;Standing;Wash/dry hands;Oral care               Lower Body Dressing: Minimal assistance;Sit to/from stand Lower Body Dressing Details (indicate cue type and reason): able to adjust socks, min assist in standing  Toilet Transfer: Minimal assistance;Ambulation;RW Toilet Transfer Details (indicate cue type and reason): simulated to recliner          Functional mobility during ADLs: Minimal assistance;Rolling walker;Cueing for safety General ADL Comments: pt with decreased activity tolerance, decreased standing tolerance and fatigued today     Vision       Perception     Praxis      Cognition Arousal/Alertness: Awake/alert Behavior During Therapy: WFL for tasks assessed/performed Overall Cognitive Status: Within Functional Limits for tasks assessed Area of Impairment: Attention;Memory;Following commands;Safety/judgement;Awareness;Problem solving                   Current Attention Level: Sustained Memory: Decreased recall of precautions;Decreased short-term memory Following Commands: Follows one step commands consistently;Follows one step commands with increased time Safety/Judgement: Decreased awareness of safety;Decreased awareness of deficits Awareness: Emergent Problem Solving: Slow processing;Decreased initiation;Difficulty sequencing;Requires verbal cues          Exercises     Shoulder Instructions       General Comments pt SOB with activity, oxgyen saturations maintained >95% on RA  Pertinent Vitals/ Pain       Pain Assessment: No/denies pain  Home Living                                          Prior Functioning/Environment               Frequency  Min 2X/week        Progress Toward Goals  OT Goals(current goals can now be found in the care plan section)  Progress towards OT goals: Progressing toward goals  Acute Rehab OT Goals Patient Stated Goal: to get home  OT Goal Formulation: With patient Time For Goal Achievement: 09/18/18 Potential to Achieve Goals: Good  Plan Discharge plan remains appropriate;Frequency remains appropriate    Co-evaluation                 AM-PAC PT "6 Clicks" Daily Activity     Outcome Measure   Help from another person eating meals?: None Help from another person taking care of personal grooming?: A Little Help from another person toileting, which includes using toliet, bedpan, or urinal?: A Little Help from another person bathing (including washing, rinsing, drying)?: A Lot Help from another person to put on and taking off regular upper body clothing?: A Little Help from another person to put on and taking off regular lower body clothing?: A Lot 6 Click Score: 17    End of Session Equipment Utilized During Treatment: Gait belt;Rolling walker  OT Visit Diagnosis: Other abnormalities of gait and mobility (R26.89);Muscle weakness (generalized) (M62.81);Other symptoms and signs involving cognitive function;Unsteadiness on feet (R26.81)   Activity Tolerance Patient tolerated treatment well   Patient Left with call bell/phone within reach;in chair;with chair alarm set   Nurse Communication Mobility status        Time: 5366-4403 OT Time Calculation (min): 28 min  Charges: OT General Charges $OT Visit: 1 Visit OT Treatments $Self Care/Home Management : 23-37 mins  Chancy Milroy, OT Acute Rehabilitation Services Pager [phone] Office [phone]    Chancy Milroy 09/05/2018, 12:42 PM

## 2018-09-05 NOTE — Progress Notes (Signed)
Family Medicine Teaching Service Daily Progress Note Intern Pager: [phone]  Patient name: [name] [name] Medical record number: [id number] Date of birth: 1924/03/21 Age: 84 y.o. Gender: female  Primary Care Provider: Olive Bass, MD Consultants: Neurology Code Status: full  Pt Overview and Major Events to Date:  10/26 admitted to [facility], neurology consulted, code stroke:   Assessment and Plan: [name] [name] is a 84 y.o. female presenting with AMS x1day . PMH is significant for HLD, CAD, HTN, Heart block w/ pacemaker, CKD III, paroxysmal atrial fibrillation   Altered mental status: Improved Repeat CT scan showed no acute intracranial abnormality.  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  EEG still pending to evaluate for seizure-like activity.  Unlikely to be revealing given patient's improvement.  Neurology consulted appreciate the recommendations, have signed off and recommend continuing Keppra with outpatient neuro f/u.  Echo pending.  This AM A&Ox3.  -Vital signs per floor routine -Follow-up echocardiogram -Follow-up the EEG -Neurology consulted, have signed off -PT - HHPT/OT - HHOT, face-to-face order placed -SCDs for DVT prophylaxis -Pravastatin 20 mg daily - Aspirin 325 mg daily, patient reports allergy, will discuss with her allergy and determine plan for discharge -Regular diet  Recent subarachnoid hemorrhage Resolved on CT head x2.  Discharged on Keppra from [facility] secondary to bleed.  Neuro recommends continuing keppra and neuro f/u outpatient.  Has outpatient follow-up with neurosurgery as scheduled.  Received aspirin x1. -SCDs for DVT prophylaxis -Continue Keppra 500 mg twice daily -Neurology consulted, have signed off -Follow-up EEG  Hyperlipidemia Pravastatin 20 mg listed as home medication.  Per patient reports only taking this for a long time.  Unclear benefit in the long-term for her  84 year old patient.  Cholesterol panel only significant for LDL 106.  Neuro recommends considering increasing pravastatin to 40mg . - continue pravastatin 20mg  daily, consider increasing to 40mg   Hypertension AM BP 140/61. Intermittent hypertension overnight. BP goal now normotensive per neuro note.  Takes amlodipine 2.5 mg daily at home.  Will restart given normotensive BP goal. -restart amlodipine 2.5 mg daily - cont to monitor BP  Heart block with pacemaker placement/ afib Chads-Vasc score 8.  Initially on Xarelto but held due to subarachnoid hemorrhage.  SCDs for DVT prophylaxis. continue cardiac monitoring. -SCDs for DVT prophylaxis; hold anti-coag and setting of SAH -Telemetry - will have cards interrogate device prior to d/c  CHF Echo from 05/2018 showing EF of 45 to 50%.  Repeat echo pending.  Saline lock IV  Increased sleepiness/weakness Progressive weakness since discharge from outside hospital.  Apparently been sleeping around 20 hours/day and only waking for meals and to urinate. Her symptoms are most consistent with postconcussive syndrome after sustaining fall prompting her presentation to [facility].  Keppra could be contributing to symptoms.  FEN/GI: Regular diet PPx: SCDs  Disposition: Home pending EEG and Echo  Subjective:  Patient reports that she is feeling well this AM and has no complaints.  Objective: Temp:  [97.5 F (36.4 C)-98.6 F (37 C)] 97.5 F (36.4 C) (10/28 0724) Pulse Rate:  [68-76] 70 (10/28 0724) Resp:  [15-18] 16 (10/28 0724) BP: (133-168)/(62-82) 140/64 (10/28 0724) SpO2:  [94 %-96 %] 95 % (10/28 0724)  Physical Exam:  General: 84 y.o. female in NAD Cardio: RRR no m/r/g Lungs: CTAB, no wheezing, no rhonchi, no crackles Abdomen: Soft, non-tender to palpation, positive bowel sounds Skin: warm and dry Extremities: No edema Neuro: A&Ox3, CNII-XII grossly  intact, 4/5 strength LLE chronically, 5/5 strength BUE, RLE   Laboratory: Recent  Labs  Lab 09/03/18 1716 09/04/18 0835 09/05/18 0624  WBC 5.4 4.0 4.6  HGB 14.1 12.9 13.4  HCT 47.7* 40.7 41.3  PLT 133* 163 165   Recent Labs  Lab 09/03/18 1710 09/03/18 1716 09/04/18 0509  NA 139 137 138  K 3.8 3.9 3.7  CL 106 108 106  CO2  --  20* 22  BUN 17 15 14   CREATININE 1.10* 1.17* 0.99  CALCIUM  --  9.4 9.3  PROT  --  6.1*  --   BILITOT  --  1.1  --   ALKPHOS  --  42  --   ALT  --  12  --   AST  --  27  --   GLUCOSE 95 94 95    Imaging/Diagnostic Tests: CLINICAL DATA:  84 year old female status post fall earlier this month with trace subarachnoid hemorrhage. Stroke-like symptoms.  EXAM: CT HEAD WITHOUT CONTRAST  TECHNIQUE: Contiguous axial images were obtained from the base of the skull through the vertex without intravenous contrast.  COMPARISON:  Head CTs 09/03/2018, 08/30/2018, and earlier.  FINDINGS: Brain: Stable cerebral volume. 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 Compare series 5, image 28 today to series 5, image 33 on 12/31/2017.  No subdural space blood is identified. There is no associated midline shift. Minimal if any mass effect on both hemispheres. Basilar cisterns remain normal.  No cortically based acute infarct identified. Chronic left PCA territory encephalomalacia. Stable gray-white matter differentiation throughout the brain.  Vascular: Calcified atherosclerosis at the skull base. No suspicious intracranial vascular hyperdensity.  Skull: Negative.  Sinuses/Orbits: Visualized paranasal sinuses and mastoids are stable and well pneumatized.  Other: Orbit and scalp soft tissues appear stable and negative.  IMPRESSION: 1.  No acute intracranial abnormality. 2. 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 3. Chronic left PCA encephalomalacia. No new intracranial ischemia is evident.  Santino Kinsella, Solmon Ice, DO 09/05/2018, 7:52 AM PGY-1,  Family Medicine FPTS Intern pager: [phone], text pages welcome

## 2018-09-05 NOTE — Progress Notes (Signed)
EEG completed; results pending.    

## 2018-09-05 NOTE — Plan of Care (Signed)
°  Problem: Health Behavior/Discharge Planning: °Goal: Ability to manage health-related needs will improve °Outcome: Progressing °  °Problem: Clinical Measurements: °Goal: Ability to maintain clinical measurements within normal limits will improve °Outcome: Progressing °  °Problem: Clinical Measurements: °Goal: Respiratory complications will improve °Outcome: Progressing °  °Problem: Clinical Measurements: °Goal: Cardiovascular complication will be avoided °Outcome: Progressing °  °

## 2018-09-05 NOTE — Consult Note (Signed)
Cardiology Consultation:   Patient ID: [name] [name] MRN: [id number]; DOB: February 28, 1924  Admit date: 09/03/2018 Date of Consult: 09/05/2018  Primary Care Provider: Algis Greenhouse, MD Primary Cardiologist: Shirlee More, MD  Primary Electrophysiologist:  Caryl Comes    Patient Profile:   [name] [name] is a 84 y.o. female with a hx of chronic diastolic HF, HTN, PAT and PAF, chronic anticoagulation, PPM and syncope  who is being seen today for the evaluation of CHF and decreased EF at the request of Dr. Owens Shark.  History of Present Illness:   [name] [name] followed by Dr. Bettina Gavia and Dr. Caryl Comes for chronic diastolic HF, HTN, PAT and PAF, CHB requiring PPM-MDT, on anticoagulation, and syncope.  Also hx of cerebral hemorrhage with CVA, hx of another CVA 3/17 with lt sided hemiparesis,  Hx of Lt peroneal DVT.  She was placed on Xarelto after much thought.    Also hx of orthostatic hypotension which has made diuresing more difficult.  On last visit with Dr. Bettina Gavia diuretic was changed to prn for wt gain of 5 pounds.  CAD and possible MI in 1975, and non obstructive cath in 2011.    Pt presented to ER 09/03/18 with code stroke, weakness and slurred speech.  Had been seen at [facility] 08/30/18 for [facility] after fall.  CT head at [facility]:   Brain: Unchanged trace acute right parietal sulcal subarachnoid hemorrhage. No new areas of acute hemorrhage. Low-density subdural collections overlying the bilateral cerebral convexities measuring up to 12 mm in thickness on the right and 10 mm in thickness on the left (series 601, image 35). Resolution of internal areas of hyperdensity relative to prior favoring these areas to have represented enhancing subdural effusions. No evidence of acute large vascular territory infarct. Remote left occipital infarct. No mass effect, mass lesion, or hydrocephalus.  carelto was stopped at discharge.    Here at [facility], CT head without hemorrhage, not a TPA candidate.  Felt to be TIA vs seizures.   Holding anticoagulation now ASA has been approved.   EKG SR with V pacing. I personally reviewed. Tele SR with AV pacing and PACs Na 137, K+ 3.9, Cr 1.01, Hgb 13.4   Now allowing permissive HTN to 220/110. Holding meds.   A fib - maintaining SR with AV pacing.     Today follow up echo with decrease in EF to 35-40% from 45-50% Though very poor acoustic windows.  EEG results pending, carotid dopplers results pending.   Currently she is DOE per pt and nurse.  No chest pain .  AV pacing   Past Medical History:  Diagnosis Date  . Acute deep vein thrombosis (DVT) of right peroneal vein 11/30/2017   Overview:  0347: uncomplicated, xarelto  . AKI (acute kidney injury) ([place]) 01/01/2018  . Arthritis   . Atrial fibrillation ([place]) 02/08/2016  . Benign essential HTN 01/15/2016  . Bradycardia with 31 - 40 beats per minute 05/14/2015  . Bronchiectasis, non-tuberculous ([place])    a. Followed by Dr. Joya Gaskins.  . Cerebrovascular accident (CVA) due to thrombosis of right middle cerebral artery ([place]) 02/07/2016   Overview:  2017: R-MCA thrombosis with left non-dom hemiparesis, thalamic capsule ischemia 2018: recurrent  . Chest pain with low risk for cardiac etiology 11/18/2015  . Chronic kidney disease, stage III (moderate) ([place]) 01/15/2016  . Chronic pain of left knee 12/07/2016  . Closed compression fracture of fourth lumbar vertebra ([place]) 01/19/2017   Overview:  2015  . Closed fracture of ramus of left  pubis ([place]) 06/18/2017   Overview:  2018  . Colon polyps   . Complete atrioventricular block ([place]) 01/15/2016   Overview:  2016: PM  . Complete heart block ([place])    a. s/p MDT dual chamber PPM   . COPD (chronic obstructive pulmonary disease) ([place])    past hx with pneumonia  . Coronary artery disease prior MI   . Diverticulosis   . DVT (deep venous thrombosis) (H[facility])    Resolved, no anticoagulation currently  . Dyspnea on exertion 10/12/2017   Overview:  2018  . Edema -lower extremity 06/22/2013  . Fatigue  08/19/2012  . GERD (gastroesophageal reflux disease)   . Hammer toe of left foot 06/07/2017  . Headache   . Heart block   . Hemiparesis of left nondominant side as late effect of cerebral infarction ([place]) 12/22/2016  . High cholesterol   . History of pulmonary embolism 08/30/2012   PE 2013  >>repeat CT Angio chest 12/2013>>>NO PE   . Hypertension   . IBS (irritable bowel syndrome)   . Left foot pain 06/07/2017  . Left leg pain 08/03/2016   Overview:  2017: related to weakness left leg from stroke  . Lumbar radiculopathy 07/27/2017  . 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 Malaise and fatigue 10/27/2017  . MI (myocardial infarction) ?? probably not 1975   a. 1975 Reported MI;  b. 2011 Cath: nonobs dzs;  c. 2013 Nonischemic Myoview;  d. 12/2012 Echo: EF 60-65%, Gr1 DD.  . Nephrolithiasis   . Orthostatic syncope 01/01/2018  . Osteoarthritis of right knee 12/12/2012  . Osteoporosis 08/01/2016  . Pain in limb 08/16/2013  . Pain of left femur 06/17/2017   Overview:  2018  . Palpitation    a. 2010 Event Monitor: PACs and PVCs  . Panlobular emphysema ([place]) 01/15/2016  . Paroxysmal supraventricular tachycardia ([place]) 01/15/2016  . Pneumonia   . PONV (postoperative nausea and vomiting)   . Post-dural puncture headache 09/01/2017  . Presence of cardiac pacemaker 01/16/2016   July 5,2016 Overview:  2016: complete AVB  . Presence of permanent cardiac pacemaker   . Pulmonary fibrosis ([place])   . S/P left knee arthroscopy 04/01/2017  . S/P TKR (total knee replacement) using cement, right 12/07/2016  . Stroke ([place])   . Varicose veins of lower extremities with other complications 45/06/997  . Vitamin D deficiency 08/01/2016  . Weakness generalized 11/25/2017  . Wears dentures    full  . Wide-complex tachycardia ([place])    a. Followed by Dr. Caryl Comes, "I think this is VT, but i am not sure QRSd 135 1:1 AV."    Past Surgical History:  Procedure Laterality Date  . APPENDECTOMY    . BLADDER  SUSPENSION     tack  . CARDIAC CATHETERIZATION Right 05/14/2015   Procedure: Temporary Pacemaker;  Surgeon: Thompson Grayer, MD;  Location: [facility];  Service: Cardiovascular;  Laterality: Right;  . CHOLECYSTECTOMY    . COLON SURGERY     12inches colectomy s/p diverticulitis  . COLONOSCOPY W/ BIOPSIES AND POLYPECTOMY    . EP IMPLANTABLE DEVICE N/A 05/14/2015   Procedure: Pacemaker Implant;  Surgeon: Thompson Grayer, MD; [place] 1 (serial number [id number])     . IR FL GUIDED LOC OF NEEDLE/CATH TIP FOR SPINAL INJECTION LT  09/06/2017  . KNEE ARTHROSCOPY Left 03/29/2017   Procedure: ARTHROSCOPY LEFT KNEE WITH PARTIAL MEDIAL AND PARTIAL LATERAL MENISCECTOMY;  Surgeon: Vickey Huger, MD;  Location: [facility];  Service: Orthopedics;  Laterality: Left;  Marland Kitchen MULTIPLE TOOTH EXTRACTIONS    . TONSILLECTOMY    . TOTAL ABDOMINAL HYSTERECTOMY    . TOTAL KNEE ARTHROPLASTY  12/12/2012   Procedure: TOTAL KNEE ARTHROPLASTY;  Surgeon: Alta Corning, MD;  Location: [facility];  Service: Orthopedics;  Laterality: Right;  right total knee arthroplasty     Home Medications:  Prior to Admission medications   Medication Sig Start Date End Date Taking? Authorizing Provider  acetaminophen (TYLENOL) 500 MG tablet Take 1,000 mg by mouth every 6 (six) hours as needed (pain).   Yes [provider]  budesonide-formoterol (SYMBICORT) 160-4.5 MCG/ACT inhaler Inhale 2 puffs into the lungs 2 (two) times daily as needed (wheezing/shortness of breath).   Yes [provider]  cholecalciferol (VITAMIN D) 1000 units tablet Take 1,000 Units by mouth daily.   Yes [provider]  levETIRAcetam (KEPPRA) 500 MG tablet Take 500 mg by mouth 2 (two) times daily. 08/31/18  Yes [provider]  vitamin B-12 (CYANOCOBALAMIN) 1000 MCG tablet Take 1,000 mcg by mouth daily.   Yes [provider]  amLODipine (NORVASC) 2.5 MG tablet Take 1 tablet (2.5 mg total) by mouth daily. Patient  not taking: Reported on 09/03/2018 05/20/18   Ledell Noss, MD  diclofenac sodium (VOLTAREN) 1 % GEL Apply 2 g topically 4 (four) times daily. Patient not taking: Reported on 09/03/2018 05/19/18   Ledell Noss, MD  pravastatin (PRAVACHOL) 20 MG tablet Take 1 tablet (20 mg total) by mouth daily at 6 PM. Patient not taking: Reported on 04/08/2018 03/18/18   Elmarie Shiley, MD    Inpatient Medications: Scheduled Meds: . amLODipine  2.5 mg Oral Daily  . levETIRAcetam  500 mg Oral BID  . pravastatin  20 mg Oral q1800   Continuous Infusions:  PRN Meds: acetaminophen **OR** acetaminophen (TYLENOL) oral liquid 160 mg/5 mL **OR** acetaminophen, albuterol, senna-docusate  Allergies:    Allergies  Allergen Reactions  . Crestor [Rosuvastatin] Other (See Comments)    Myalgia  . Darvon Other (See Comments)    Drop in  BP  . Meloxicam Other (See Comments)    Dizziness   . Meperidine Other (See Comments)    Hypotension  . Prednisone Diarrhea    POLYURIA  . Valsartan Other (See Comments)     Hypotension  . Amiodarone Other (See Comments)    Did not tolerate - unknown reaction  . Aspirin-Acetaminophen-Caffeine Other (See Comments)    Unknown reaction  . Nitroglycerin     Profound hypotension with sublingual nitroglycerin.   . Tramadol Other (See Comments)    Unknown reaction    Social History:   Social History   Socioeconomic History  . Marital status: Married    Spouse name: Not on file  . Number of children: 3  . Years of education: Not on file  . Highest education level: Not on file  Occupational History  . Occupation: Retired    Comment: Musician  Social Needs  . Financial resource strain: Not on file  . Food insecurity:    Worry: Not on file    Inability: Not on file  . Transportation needs:    Medical: Not on file    Non-medical: Not on file  Tobacco Use  . Smoking status: Never Smoker  . Smokeless tobacco: Never Used  Substance and Sexual Activity  .  Alcohol use: No  . Drug use: No  . Sexual activity: Not on file  Lifestyle  . Physical  activity:    Days per week: Not on file    Minutes per session: Not on file  . Stress: Not on file  Relationships  . Social connections:    Talks on phone: Not on file    Gets together: Not on file    Attends religious service: Not on file    Active member of club or organization: Not on file    Attends meetings of clubs or organizations: Not on file    Relationship status: Not on file  . Intimate partner violence:    Fear of current or ex partner: Not on file    Emotionally abused: Not on file    Physically abused: Not on file    Forced sexual activity: Not on file  Other Topics Concern  . Not on file  Social History Narrative  . Not on file    Family History:    Family History  Problem Relation Age of Onset  . Heart disease Father   . Heart attack Mother   . Stomach cancer Brother   . Other Son        Triple Bypass     ROS:  Please see the history of present illness.  General:no colds or fevers, no weight changes Skin:no rashes or ulcers HEENT:no blurred vision, no congestion CV:see HPI PUL:see HPI GI:no diarrhea constipation or melena, no indigestion GU:no hematuria, no dysuria MS:no joint pain, no claudication Neuro:no syncope, no lightheadedness--see HPI Endo:no diabetes, no thyroid disease   All other ROS reviewed and negative.     Physical Exam/Data:   Vitals:   09/04/18 2300 09/05/18 0308 09/05/18 0724 09/05/18 1216  BP: 133/66 (!) 152/62 140/64 (!) 124/51  Pulse: 68 76 70 (!) 54  Resp: 16 15 16 18   Temp: 98.6 F (37 C) 98.5 F (36.9 C) (!) 97.5 F (36.4 C)   TempSrc: Oral Oral Oral   SpO2: 94% 95% 95% 96%  Weight:      Height:        Intake/Output Summary (Last 24 hours) at 09/05/2018 1538 Last data filed at 09/05/2018 0600 Gross per 24 hour  Intake 120 ml  Output 200 ml  Net -80 ml   Filed Weights   09/04/18 0000  Weight: 62.2 kg   Body mass  index is 22.13 kg/m.  General:  Frail femael, in no acute distress HEENT: normal Lymph: no adenopathy Neck: no JVD Endocrine:  No thryomegaly Vascular: No carotid bruits; pedal pulses 2+ bilaterally  Cardiac:  normal S1, S2; RRR; no murmur gallup rub or click Lungs:  clear to auscultation bilaterally, no wheezing, rhonchi or rales  Abd: soft, nontender, no hepatomegaly  Ext: no edema Musculoskeletal:  No deformities, BUE and BLE strength normal and equal Skin: warm and dry  Neuro:  Alert and oriented X 3, memory poor on sequence of events from here to [facility]. , no focal abnormalities noted Psych:  Normal affect     Relevant CV Studies: Echocardiogram 11/2017: Study Conclusions  - Left ventricle: The cavity size was normal. Systolic function was normal. The estimated ejection fraction was in the range of 50% to 55%. Wall motion was normal; there were no regional wall motion abnormalities. There was an increased relative contribution of atrial contraction to ventricular filling. Doppler parameters are consistent with abnormal left ventricular relaxation (grade 1 diastolic dysfunction). - Aortic valve: Trileaflet; normal thickness, mildly calcified leaflets. - Mitral valve: There was mild regurgitation. - Impressions: The findings indicate significant septal-lateral left ventricular  wall dyssynchrony due to RV pacing  Impressions:  - Low normal LVF with EF 50-55% with septal lateral wall dyssynchrony due to RV pacing, mild TR and MR, grade 1 DD>The findings indicate significant septal-lateral left ventricular wall dyssynchrony due to RV pacing  NST 2016: Study Highlights    Nuclear stress EF: 60%.  The study is normal.  This is a low risk study.  The left ventricular ejection fraction is normal (55-65%).   [place] 2011: - LMCA - normal - LAD - ectatic 30-40% proximal LAD disease. Eccentric 70% proximal linear lesion in a modest size first  diagonal branch - LCx - 30% proximal disease - RCA - 30-40% smooth mid right coronary disease - Ramus - Proximal 60% tubular disease  05/19/18 nuc study:  There was no ST segment deviation noted during stress.  No T wave inversion was noted during stress.  There is a medium defect of mild severity present in the basal inferoseptal, mid anteroseptal, mid inferoseptal and apical septal location. The defect is non-reversible and likely related to abnormal septal wall motion from ventricular pacing. No ischemia noted.  There is a small defect of mild severity present in the apex location that is consistent with breast attenuation artifact.  Nuclear stress EF: 55% with paradoxical septal wall motion in the apical septum, mid infero and anterseptum secondary to venticular pacing.  This is a low risk study.   Echo 05/19/18 Study Conclusions  - Left ventricle: Poor acoustic windows limit study. LVEF is   approximateihy 45 to 50% with hypokinesis of the inferior,   inferoseptal walls The cavity size was normal. Wall thickness was   normal. - Mitral valve: There was mild regurgitation. - Pulmonary arteries: PA peak pressure: 33 mm Hg (S).  Echo 09/05/18  Study Conclusions  - Left ventricle: The cavity size was normal. Wall thickness was   normal. Systolic function was moderately reduced. The estimated   ejection fraction was in the range of 35% to 40%.  Impressions:  - Very poor acoustic windows, even with use of Definity Frequent   ectopy ALl make evaluation of LVEF difficult   Compared to echo images from July 2019, LVEF is worse.  Laboratory Data:  Chemistry Recent Labs  Lab 09/03/18 1716 09/04/18 0509 09/05/18 0624  NA 137 138 137  K 3.9 3.7 3.9  CL 108 106 108  CO2 20* 22 22  GLUCOSE 94 95 110*  BUN 15 14 14   CREATININE 1.17* 0.99 1.01*  CALCIUM 9.4 9.3 9.3  GFRNONAA 39* 48* 47*  GFRAA 45* 56* 54*  ANIONGAP 9 10 7     Recent Labs  Lab 09/03/18 1716  PROT  6.1*  ALBUMIN 3.7  AST 27  ALT 12  ALKPHOS 42  BILITOT 1.1   Hematology Recent Labs  Lab 09/03/18 1716 09/04/18 0835 09/05/18 0624  WBC 5.4 4.0 4.6  RBC 4.68 4.44 4.55  HGB 14.1 12.9 13.4  HCT 47.7* 40.7 41.3  MCV 101.9* 91.7 90.8  MCH 30.1 29.1 29.5  MCHC 29.6* 31.7 32.4  RDW 12.9 13.1 13.0  PLT 133* 163 165   Cardiac EnzymesNo results for input(s): TROPONINI in the last 168 hours.  Recent Labs  Lab 09/03/18 1709  TROPIPOC 0.01    BNPNo results for input(s): BNP, PROBNP in the last 168 hours.  DDimer No results for input(s): DDIMER in the last 168 hours.  Radiology/Studies:  Ct Head Wo Contrast  Result Date: 09/04/2018 CLINICAL DATA:  84 year old female status post fall  earlier this month with trace subarachnoid hemorrhage. Stroke-like symptoms. EXAM: CT HEAD WITHOUT CONTRAST TECHNIQUE: Contiguous axial images were obtained from the base of the skull through the vertex without intravenous contrast. COMPARISON:  Head CTs 09/03/2018, 08/30/2018, and earlier. FINDINGS: Brain: Stable cerebral volume. 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 Compare series 5, image 28 today to series 5, image 33 on 12/31/2017. No subdural space blood is identified. There is no associated midline shift. Minimal if any mass effect on both hemispheres. Basilar cisterns remain normal. No cortically based acute infarct identified. Chronic left PCA territory encephalomalacia. Stable gray-white matter differentiation throughout the brain. Vascular: Calcified atherosclerosis at the skull base. No suspicious intracranial vascular hyperdensity. Skull: Negative. Sinuses/Orbits: Visualized paranasal sinuses and mastoids are stable and well pneumatized. Other: Orbit and scalp soft tissues appear stable and  negative. IMPRESSION: 1.  No acute intracranial abnormality. 2. 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 3. Chronic left PCA encephalomalacia. No new intracranial ischemia is evident. Electronically Signed   By: Genevie Ann M.D.   On: 09/04/2018 08:48   Dg Chest Port 1 View  Result Date: 09/03/2018 CLINICAL DATA:  Weakness and slurred speech. History of hypertension and COPD. EXAM: PORTABLE CHEST 1 VIEW COMPARISON:  08/30/2018 FINDINGS: Cardiac silhouette is mildly enlarged. No mediastinal or hilar masses. Lungs are hyperexpanded. No evidence of pneumonia or pulmonary edema. No pleural effusion or pneumothorax. Stable left anterior chest wall sequential pacemaker. Skeletal structures are grossly intact. IMPRESSION: No acute cardiopulmonary disease. Electronically Signed   By: Lajean Manes M.D.   On: 09/03/2018 18:19   Ct Head Code Stroke Wo Contrast  Result Date: 09/03/2018 CLINICAL DATA:  Code stroke.  Recent intracranial hemorrhage. EXAM: CT HEAD WITHOUT CONTRAST TECHNIQUE: Contiguous axial images were obtained from the base of the skull through the vertex without intravenous contrast. COMPARISON:  Head CT 08/30/2018 FINDINGS: Brain: No acute hemorrhage. Diffuse enlargement of the extra-axial CSF spaces, unchanged. No acute cortical infarct. Unchanged appearance of old left occipital infarct. Previously seen small volume subarachnoid hemorrhage is no longer visible. Vascular: Atherosclerotic calcification of the internal carotid arteries at the skull base. No abnormal hyperdensity of the major intracranial arteries or dural venous sinuses. Skull: The visualized skull base, calvarium and extracranial soft tissues are normal. Sinuses/Orbits: No fluid levels or advanced mucosal thickening of the visualized paranasal sinuses. No mastoid or middle ear effusion. The orbits are normal. ASPECTS [facility] Stroke Program Early CT  Score) - Ganglionic level infarction (caudate, lentiform nuclei, internal capsule, insula, M1-M3 cortex): 7 - Supraganglionic infarction (M4-M6 cortex): 3 Total score (0-10 with 10 being normal): 10 IMPRESSION: 1. No acute hemorrhage. Resolution of previously identified small volume subarachnoid hemorrhage. 2. ASPECTS is 10. These results were communicated to Dr. Karena Addison Aroor at 5:19 pm on 09/03/2018 by text page via the [facility] messaging system. Electronically Signed   By: Ulyses Jarred M.D.   On: 09/03/2018 17:20    Assessment and Plan:   1. New cardiomyopathy, would recommend beginning metoprolol 12.5 to 25 mg BID when ok with neuro-- diuretics prn, only would use support stockings for orthostatic hypotension. Possible PPM upgrade to BiV as outpt 2.  PAF off xarelto due to [facility] with fall.  Maintaining SR with AV pacing.  3.  Chronic diastolic HF, diuretics prn 4.   HX of CVA, hx of cerebral bleed in past. 5. HLD on statin 6. CAD with neg nuc in July, last cath non obstructive disease       For questions or updates, please contact [facility] Please consult [email or web address] for contact info under     Signed, Cecilie Kicks, NP  09/05/2018 3:38 PM

## 2018-09-05 NOTE — Progress Notes (Signed)
  Echocardiogram 2D Echocardiogram with definity has been performed.  Leta Jungling M 09/05/2018, 10:17 AM

## 2018-09-05 NOTE — Care Management Note (Signed)
Case Management Note  Patient Details  Name: [name] [name] MRN: [id number] Date of Birth: 1923/12/06  Subjective/Objective:    Patient in with altered mental status. She is from home with her spouse. She was active with [facility] for PT prior to admission.  Pts son interested in trying to find ALF for patient and her spouse after d/c. Son states family can provide 24 hour assistance at d/c.                 Action/Plan: Recommendations are for [facility] services. CM updated son. CM following for further d/c needs.   Expected Discharge Date:  09/06/18               Expected Discharge Plan:  Home w Home Health Services  In-House Referral:     Discharge planning Services  CM Consult  Post Acute Care Choice:  Home Health Choice offered to:  Patient, Adult Children  DME Arranged:    DME Agency:     HH Arranged:  PT, OT, Social Work [employer] Agency:  [facility]  Status of Service:  In process, will continue to follow  If discussed at Long Length of Stay Meetings, dates discussed:    Additional Comments:  Kermit Balo, RN 09/05/2018, 1:39 PM

## 2018-09-05 NOTE — Progress Notes (Addendum)
Initial Nutrition Assessment  DOCUMENTATION CODES:   Not applicable  INTERVENTION:  Provide Ensure Enlive po BID, each supplement provides 350 kcal and 20 grams of protein.  Encourage adequate PO intake.  NUTRITION DIAGNOSIS:   Increased nutrient needs related to acute illness(SDH) as evidenced by estimated needs.  GOAL:   Patient will meet greater than or equal to 90% of their needs  MONITOR:   PO intake, Supplement acceptance, Labs, Weight trends, I & O's, Skin  REASON FOR ASSESSMENT:   Consult Assessment of nutrition requirement/status  ASSESSMENT:   84 y.o. female presenting with AMS x1day . PMH is significant for HLD, CAD, HTN, Heart block w/ pacemaker, CKD III, paroxysmal atrial fibrillation   Pt unavailable during multiple visit attempts. RD unable to obtain most recent nutrition history. No recent meal completion recorded. RD to order nutritional supplements to aid in caloric and protein needs.   Unable to complete Nutrition-Focused physical exam at this time.   Labs and medications reviewed.   Diet Order:   Diet Order            Diet regular Room service appropriate? Yes; Fluid consistency: Thin  Diet effective now              EDUCATION NEEDS:   Not appropriate for education at this time  Skin:  Skin Assessment: Reviewed RN Assessment  Last BM:  Unknown  Height:   Ht Readings from Last 1 Encounters:  09/04/18 5\' 6"  (1.676 m)    Weight:   Wt Readings from Last 1 Encounters:  09/04/18 62.2 kg    Ideal Body Weight:  59 kg  BMI:  Body mass index is 22.13 kg/m.  Estimated Nutritional Needs:   Kcal:  1450-1650  Protein:  60-75 grams  Fluid:  >/= 1.5 L/day    Roslyn Smiling, MS, RD, LDN Pager # [phone] After hours/ weekend pager # [phone]

## 2018-09-05 NOTE — Procedures (Signed)
ELECTROENCEPHALOGRAM REPORT   Patient: [name] [name]       Room #: 6E95M EEG No. ID: [id number] Age: 84 y.o.        Sex: female Referring Physician: Manson Passey Report Date:  09/05/2018        Interpreting Physician: Thana Farr  History: [name] [name] is an 84 y.o. female with left sided weakness  Medications:  Norvasc, Keppra, Pravachol  Conditions of Recording:  This is a 21 channel routine scalp EEG performed with bipolar and monopolar montages arranged in accordance to the international 10/20 system of electrode placement. One channel was dedicated to EKG recording.  The patient is in the awake and drowsy states.  Description:  The waking background activity consists of a low voltage, symmetrical, fairly well organized, 7 Hz theta activity, seen from the parieto-occipital and posterior temporal regions.  Low voltage fast activity, poorly organized, is seen anteriorly and is at times superimposed on more posterior regions.  A mixture of theta and alpha rhythms are seen from the central and temporal regions. The patient drowses with slowing to irregular, low voltage theta and beta activity.   Stage II sleep is not obtained. No epileptiform activity is noted.   Hyperventilation and intermittent photic stimulation were not performed.   IMPRESSION: This is an abnormal EEG secondary to posterior background slowing.  This finding may be seen with a diffuse gray matter disturbance that is etiologically nonspecific, but may include a dementia, among other possibilities.  No epileptiform activity is noted.     Thana Farr, MD Neurology [phone] 09/05/2018, 5:57 PM

## 2018-09-06 ENCOUNTER — Telehealth: Payer: Self-pay | Admitting: Cardiology

## 2018-09-06 DIAGNOSIS — I609 Nontraumatic subarachnoid hemorrhage, unspecified: Secondary | ICD-10-CM | POA: Diagnosis not present

## 2018-09-06 DIAGNOSIS — I5021 Acute systolic (congestive) heart failure: Secondary | ICD-10-CM | POA: Diagnosis not present

## 2018-09-06 DIAGNOSIS — R569 Unspecified convulsions: Secondary | ICD-10-CM | POA: Diagnosis not present

## 2018-09-06 DIAGNOSIS — G960 Cerebrospinal fluid leak: Secondary | ICD-10-CM | POA: Diagnosis not present

## 2018-09-06 DIAGNOSIS — R410 Disorientation, unspecified: Secondary | ICD-10-CM | POA: Diagnosis not present

## 2018-09-06 DIAGNOSIS — Z950 Presence of cardiac pacemaker: Secondary | ICD-10-CM | POA: Diagnosis not present

## 2018-09-06 LAB — BASIC METABOLIC PANEL
ANION GAP: 9 (ref 5–15)
BUN: 21 mg/dL (ref 8–23)
CO2: 22 mmol/L (ref 22–32)
Calcium: 9.5 mg/dL (ref 8.9–10.3)
Chloride: 106 mmol/L (ref 98–111)
Creatinine, Ser: 1.12 mg/dL — ABNORMAL HIGH (ref 0.44–1.00)
GFR, EST AFRICAN AMERICAN: 48 mL/min — AB (ref >60)
GFR, EST NON AFRICAN AMERICAN: 41 mL/min — AB (ref >60)
Glucose, Bld: 110 mg/dL — ABNORMAL HIGH (ref 70–99)
POTASSIUM: 3.8 mmol/L (ref 3.5–5.1)
SODIUM: 137 mmol/L (ref 135–145)

## 2018-09-06 MED ORDER — METOPROLOL TARTRATE 12.5 MG HALF TABLET
12.5000 mg | ORAL_TABLET | Freq: Two times a day (BID) | ORAL | Status: DC
  Administered 2018-09-06: 12.5 mg via ORAL
  Filled 2018-09-06: qty 1

## 2018-09-06 MED ORDER — ENSURE ENLIVE PO LIQD: 1.0000 | Freq: Two times a day (BID) | ORAL | 12 refills | Status: DC

## 2018-09-06 MED ORDER — LEVETIRACETAM 500 MG PO TABS: 500.0000 mg | ORAL_TABLET | Freq: Two times a day (BID) | ORAL | 0 refills | Status: DC

## 2018-09-06 MED ORDER — METOPROLOL TARTRATE 25 MG PO TABS: 12.5000 mg | ORAL_TABLET | Freq: Two times a day (BID) | ORAL | 0 refills | Status: AC

## 2018-09-06 MED ORDER — ASPIRIN EC 81 MG PO TBEC
81.0000 mg | DELAYED_RELEASE_TABLET | Freq: Every day | ORAL | Status: DC
  Administered 2018-09-06: 81 mg via ORAL
  Filled 2018-09-06: qty 1

## 2018-09-06 MED ORDER — ASPIRIN 81 MG PO TBEC: 81.0000 mg | DELAYED_RELEASE_TABLET | Freq: Every day | ORAL | 0 refills | Status: DC

## 2018-09-06 NOTE — Progress Notes (Signed)
Patient and family given discharge instructions and summary. Able to verbalize understanding and teach back. No new questions or concerns. IV removed. discharge by staff in wheelchair, family to transport home.

## 2018-09-06 NOTE — Discharge Summary (Signed)
Family Medicine Teaching [facility] Discharge Summary  Patient name: [name] [name] Medical record number: [id number] Date of birth: 04-28-1924 Age: 84 y.o. Gender: female Date of Admission: 09/03/2018  Date of Discharge: 09/06/2018 Admitting Physician: Westley Chandler, MD  Primary Care Provider: Olive Bass, MD Consultants: Cardiology, Neurology  Indication for Hospitalization: Altered Mental Status  Discharge Diagnoses/Problem List:  Altered Mental Status  Recent Subarachnoid Hemorrhage HLD HFrEF HTN Heart block with pacemaker placement/Atrial Fibrillation  Disposition: home with home health  Discharge Condition: stable  Discharge Exam:  General: 84 y.o. female in NAD Cardio: RRR no m/r/g Lungs: CTAB, no wheezing, no rhonchi, no crackles Abdomen: Soft, non-tender to palpation, positive bowel sounds Skin: warm and dry Extremities: No edema Neuro: A&Ox3, grossly intact  Brief Hospital Course:  [name] [name] a 84 y.o.femalepresenting with AMS x1day. PMH is significant for HLD, CAD, HTN, Heart block w/ pacemaker, CKD III,paroxysmal atrial fibrillation.  Her hospital course is outlined below.  AMS Patient presented with 1 day altered mental status.  Other admission details can be found in H&P.  Neurology consulted in ED as code stroke was called.  Patient's mental status improved very quickly during her hospitalization.  Neurology believed given their evaluation that altered mental status most likely cause by a seizure at home.  She had an abnormal EEG showing posterior background slowing, that could be caused by dementia, but no epileptiform activity was noted.  Neurology recommended continuing Keppra, aspirin, and pravastatin.  She will follow-up with neurology as an outpatient.  At the time of discharge patient was at her mental baseline per family.  She was without neurological deficit.  Her vital signs are stable and she is without complaint.  Recent  SAH Patient was hospitalized at [facility] for subarachnoid hemorrhage October 22-23.  CT head on admission showed improvement of subarachnoid hemorrhage.  Repeat CT the following day showed resolution of subarachnoid hemorrhage.  Neuro stated was okay to start patient on aspirin therapy.  She will follow-up with neurology as well as neurosurgery as an outpatient.  Neurology and cardiology were in agreement that xarelto should continue to be held.  Will likely be held 2 weeks, but need to have discussion with neurology prior to re-initiating.    HLD Neurology had recommended increasing patient's pravastatin dose from 20mg  to 40mg . Given the patient's age and lack of benefit, decided to continue pravastatin at home dose of 20mg .    HTN/HFrEF Patient had an echo during admission who new reduced EF of 35% (was 45-50% in July 2019).  Cardiology was consulted.  They recommended discontinuing her home amlodipine and starting metoprolol tartrate 12.5mg  BID.  Short-acting metoprolol was started in case she has recurrent orthostatic hypotension, therefore it can be removed easier.  These changes were made prior to discharge.  She will continue to follow with Dr. Dulce Sellar as an outpatient.    Issues for Follow Up:  1. Patient should have follow up of toleration of metoprolol as this was started prior to discharge. 2. Patient is not currently receiving xarelto, but was continued on ASA.  Will likely be held for two weeks, but will need to have discussion with Neurology prior to re-initiation.  3. She should continue to follow with neurology and neurosurgery for seizures and SAH.  SAH appeared resolved on CT head during admission.  Significant Procedures: Echo  Significant Labs and Imaging:  Recent Labs  Lab 09/03/18 1716 09/04/18 0835 09/05/18 0624  WBC 5.4 4.0 4.6  HGB  14.1 12.9 13.4  HCT 47.7* 40.7 41.3  PLT 133* 163 165   Recent Labs  Lab 09/03/18 1710 09/03/18 1716 09/04/18 0509 09/05/18 0624  09/06/18 0337  NA 139 137 138 137 137  K 3.8 3.9 3.7 3.9 3.8  CL 106 108 106 108 106  CO2  --  20* 22 22 22   GLUCOSE 95 94 95 110* 110*  BUN 17 15 14 14 21   CREATININE 1.10* 1.17* 0.99 1.01* 1.12*  CALCIUM  --  9.4 9.3 9.3 9.5  ALKPHOS  --  42  --   --   --   AST  --  27  --   --   --   ALT  --  12  --   --   --   ALBUMIN  --  3.7  --   --   --     Ct Head Wo Contrast  Result Date: 09/04/2018 CLINICAL DATA:  84 year old female status post fall earlier this month with trace subarachnoid hemorrhage. Stroke-like symptoms. EXAM: CT HEAD WITHOUT CONTRAST TECHNIQUE: Contiguous axial images were obtained from the base of the skull through the vertex without intravenous contrast. COMPARISON:  Head CTs 09/03/2018, 08/30/2018, and earlier. FINDINGS: Brain: Stable cerebral volume. 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 Compare series 5, image 28 today to series 5, image 33 on 12/31/2017. No subdural space blood is identified. There is no associated midline shift. Minimal if any mass effect on both hemispheres. Basilar cisterns remain normal. No cortically based acute infarct identified. Chronic left PCA territory encephalomalacia. Stable gray-white matter differentiation throughout the brain. Vascular: Calcified atherosclerosis at the skull base. No suspicious intracranial vascular hyperdensity. Skull: Negative. Sinuses/Orbits: Visualized paranasal sinuses and mastoids are stable and well pneumatized. Other: Orbit and scalp soft tissues appear stable and negative. IMPRESSION: 1.  No acute intracranial abnormality. 2. 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 3. Chronic left PCA encephalomalacia. No new intracranial  ischemia is evident. Electronically Signed   By: [name] M.D.   On: 09/04/2018 08:48   Dg Chest Port 1 View  Result Date: 09/03/2018 CLINICAL DATA:  Weakness and slurred speech. History of hypertension and COPD. EXAM: PORTABLE CHEST 1 VIEW COMPARISON:  08/30/2018 FINDINGS: Cardiac silhouette is mildly enlarged. No mediastinal or hilar masses. Lungs are hyperexpanded. No evidence of pneumonia or pulmonary edema. No pleural effusion or pneumothorax. Stable left anterior chest wall sequential pacemaker. Skeletal structures are grossly intact. IMPRESSION: No acute cardiopulmonary disease. Electronically Signed   By: Amie Portland M.D.   On: 09/03/2018 18:19   Ct Head Code Stroke Wo Contrast  Result Date: 09/03/2018 CLINICAL DATA:  Code stroke.  Recent intracranial hemorrhage. EXAM: CT HEAD WITHOUT CONTRAST TECHNIQUE: Contiguous axial images were obtained from the base of the skull through the vertex without intravenous contrast. COMPARISON:  Head CT 08/30/2018 FINDINGS: Brain: No acute hemorrhage. Diffuse enlargement of the extra-axial CSF spaces, unchanged. No acute cortical infarct. Unchanged appearance of old left occipital infarct. Previously seen small volume subarachnoid hemorrhage is no longer visible. Vascular: Atherosclerotic calcification of the internal carotid arteries at the skull base. No abnormal hyperdensity of the major intracranial arteries or dural venous sinuses. Skull: The visualized skull base, calvarium and extracranial soft tissues  are normal. Sinuses/Orbits: No fluid levels or advanced mucosal thickening of the visualized paranasal sinuses. No mastoid or middle ear effusion. The orbits are normal. ASPECTS [facility] Stroke Program Early CT Score) - Ganglionic level infarction (caudate, lentiform nuclei, internal capsule, insula, M1-M3 cortex): 7 - Supraganglionic infarction (M4-M6 cortex): 3 Total score (0-10 with 10 being normal): 10 IMPRESSION: 1. No acute hemorrhage. Resolution of  previously identified small volume subarachnoid hemorrhage. 2. ASPECTS is 10. These results were communicated to Dr. Georgiana Spinner Aroor at 5:19 pm on 09/03/2018 by text page via the [facility] messaging system. Electronically Signed   By: Deatra Robinson M.D.   On: 09/03/2018 17:20   Vas US Carotid (at [facility])  Result Date: 09/05/2018 Carotid Arterial Duplex Study Indications: CVA. Performing Technologist: Levada Schilling RDMS, RVT  Examination Guidelines: A complete evaluation includes B-mode imaging, spectral Doppler, color Doppler, and power Doppler as needed of all accessible portions of each vessel. Bilateral testing is considered an integral part of a complete examination. Limited examinations for reoccurring indications may be performed as noted.  Right Carotid Findings: +----------+--------+--------+--------+-------------------------------+--------+           PSV cm/sEDV cm/sStenosisDescribe                       Comments +----------+--------+--------+--------+-------------------------------+--------+ CCA Prox  57      10                                                      +----------+--------+--------+--------+-------------------------------+--------+ CCA Distal51      14                                                      +----------+--------+--------+--------+-------------------------------+--------+ ICA Prox  66      12              focal, hyperechoic and calcific         +----------+--------+--------+--------+-------------------------------+--------+ ICA Distal69      23                                                      +----------+--------+--------+--------+-------------------------------+--------+ ECA       63      7               focal and calcific                      +----------+--------+--------+--------+-------------------------------+--------+ +----------+--------+-------+--------+-------------------+           PSV cm/sEDV cmsDescribeArm  Pressure (mmHG) +----------+--------+-------+--------+-------------------+ Subclavian113                                        +----------+--------+-------+--------+-------------------+ +---------+--------+--+--------+-+---------+ VertebralPSV cm/s39EDV cm/s8Antegrade +---------+--------+--+--------+-+---------+  Left Carotid Findings: +----------+--------+--------+--------+---------------------+--------+           PSV cm/sEDV cm/sStenosisDescribe             Comments +----------+--------+--------+--------+---------------------+--------+ CCA  Prox  105     14                                            +----------+--------+--------+--------+---------------------+--------+ CCA Distal69      17                                            +----------+--------+--------+--------+---------------------+--------+ ICA Prox  57      16              focal and hyperechoic         +----------+--------+--------+--------+---------------------+--------+ ICA Distal82      11                                            +----------+--------+--------+--------+---------------------+--------+ ECA       86      13                                            +----------+--------+--------+--------+---------------------+--------+ +----------+--------+--------+--------+-------------------+ SubclavianPSV cm/sEDV cm/sDescribeArm Pressure (mmHG) +----------+--------+--------+--------+-------------------+           174                                         +----------+--------+--------+--------+-------------------+ +---------+--------+--+--------+--+ VertebralPSV cm/s53EDV cm/s15 +---------+--------+--+--------+--+  Summary: Right Carotid: Velocities in the right ICA are consistent with a 1-39% stenosis. Left Carotid: Velocities in the left ICA are consistent with a 1-39% stenosis. Vertebrals: Bilateral vertebral arteries demonstrate antegrade flow. *See table(s) above for  measurements and observations.  Electronically signed by Waverly Ferrari MD on 09/05/2018 at 5:29:44 PM.    Final    Echo 09/05/2018 LV EF: 35% - 40% Study Conclusions  - Left ventricle: The cavity size was normal. Wall thickness was normal. Systolic function was moderately reduced. The estimated ejection fraction was in the range of 35% to 40%.  Impressions:  - Very poor acoustic windows, even with use of Definity Frequent ectopy ALl make evaluation of LVEF difficult Compared to echo images from July 2019, LVEF is worse.  Results/Tests Pending at Time of Discharge: None  Discharge Medications:  Allergies as of 09/06/2018      Reactions   Crestor [rosuvastatin] Other (See Comments)   Myalgia   Darvon Other (See Comments)   Drop in  BP   Meloxicam Other (See Comments)   Dizziness    Meperidine Other (See Comments)   Hypotension   Prednisone Diarrhea   POLYURIA   Valsartan Other (See Comments)    Hypotension   Amiodarone Other (See Comments)   Did not tolerate - unknown reaction   Aspirin-acetaminophen-caffeine Other (See Comments)   Unknown reaction   Nitroglycerin    Profound hypotension with sublingual nitroglycerin.    Tramadol Other (See Comments)   Unknown reaction      Medication List    STOP taking these medications   amLODipine 2.5 MG tablet Commonly known as:  NORVASC   diclofenac sodium  1 % Gel Commonly known as:  VOLTAREN     TAKE these medications   acetaminophen 500 MG tablet Commonly known as:  TYLENOL Take 1,000 mg by mouth every 6 (six) hours as needed (pain).   aspirin 81 MG EC tablet Take 1 tablet (81 mg total) by mouth daily. Start taking on:  09/07/2018   budesonide-formoterol 160-4.5 MCG/ACT inhaler Commonly known as:  SYMBICORT Inhale 2 puffs into the lungs 2 (two) times daily as needed (wheezing/shortness of breath).   cholecalciferol 1000 units tablet Commonly known as:  VITAMIN D Take 1,000 Units by mouth  daily.   feeding supplement (ENSURE ENLIVE) Liqd Take 237 mLs by mouth 2 (two) times daily between meals.   levETIRAcetam 500 MG tablet Commonly known as:  KEPPRA Take 1 tablet (500 mg total) by mouth 2 (two) times daily.   metoprolol tartrate 25 MG tablet Commonly known as:  LOPRESSOR Take 0.5 tablets (12.5 mg total) by mouth 2 (two) times daily.   pravastatin 20 MG tablet Commonly known as:  PRAVACHOL Take 1 tablet (20 mg total) by mouth daily at 6 PM.   vitamin B-12 1000 MCG tablet Commonly known as:  CYANOCOBALAMIN Take 1,000 mcg by mouth daily.       Discharge Instructions: Please refer to Patient Instructions section of EMR for full details.  Patient was counseled important signs and symptoms that should prompt return to medical care, changes in medications, dietary instructions, activity restrictions, and follow up appointments.   Follow-Up Appointments: Follow-up Information    Baldo Daub, MD Follow up on 09/22/2018.   Specialty:  Cardiology Why:  1:40PM. Cardiology visit Contact information: [address] [place] [address] [phone]        Olive Bass, MD. Call.   Specialty:  Family Medicine Why:  to make an appointment in 1 week. Contact information: [address] [place] [address] [phone]           Unknown Jim, DO 09/06/2018, 1:40 PM PGY-1, [facility] Family [facility]

## 2018-09-06 NOTE — Care Management Important Message (Signed)
Important Message  Patient Details  Name: [name] [name] MRN: [id number] Date of Birth: 1924-05-24   Medicare Important Message Given:  Yes    Morganne Haile 09/06/2018, 1:35 PM

## 2018-09-06 NOTE — Progress Notes (Signed)
Progress Note  Patient Name: [name] [name] Date of Encounter: 09/06/2018  Primary Cardiologist: Norman Herrlich, MD   Subjective   Denies any CP or SOB  Inpatient Medications    Scheduled Meds: . feeding supplement (ENSURE ENLIVE)  237 mL Oral BID BM  . levETIRAcetam  500 mg Oral BID  . metoprolol tartrate  12.5 mg Oral BID  . pravastatin  20 mg Oral q1800   Continuous Infusions:  PRN Meds: acetaminophen **OR** acetaminophen (TYLENOL) oral liquid 160 mg/5 mL **OR** acetaminophen, albuterol, senna-docusate   Vital Signs    Vitals:   09/05/18 2311 09/06/18 0316 09/06/18 0804 09/06/18 0930  BP: (!) 131/50 (!) 117/50 (!) 146/49   Pulse: (!) 53 86 (!) 52 64  Resp: 17 17 20    Temp: 97.7 F (36.5 C) 97.7 F (36.5 C) 97.7 F (36.5 C)   TempSrc: Oral Oral Oral   SpO2: 94% 97% 94%   Weight:      Height:       No intake or output data in the 24 hours ending 09/06/18 1017 Filed Weights   09/04/18 0000  Weight: 62.2 kg    Telemetry    Not on telemetry - Personally Reviewed  ECG    Sinus rhythm - Personally Reviewed  Physical Exam   GEN: No acute distress.   Neck: No JVD Cardiac: RRR, no murmurs, rubs, or gallops.  Respiratory: Clear to auscultation bilaterally. GI: Soft, nontender, non-distended  MS: No edema; No deformity. Neuro:  Nonfocal  Psych: Normal affect   Labs    Chemistry Recent Labs  Lab 09/03/18 1716 09/04/18 0509 09/05/18 0624 09/06/18 0337  NA 137 138 137 137  K 3.9 3.7 3.9 3.8  CL 108 106 108 106  CO2 20* 22 22 22   GLUCOSE 94 95 110* 110*  BUN 15 14 14 21   CREATININE 1.17* 0.99 1.01* 1.12*  CALCIUM 9.4 9.3 9.3 9.5  PROT 6.1*  --   --   --   ALBUMIN 3.7  --   --   --   AST 27  --   --   --   ALT 12  --   --   --   ALKPHOS 42  --   --   --   BILITOT 1.1  --   --   --   GFRNONAA 39* 48* 47* 41*  GFRAA 45* 56* 54* 48*  ANIONGAP 9 10 7 9      Hematology Recent Labs  Lab 09/03/18 1716 09/04/18 0835 09/05/18 0624  WBC 5.4  4.0 4.6  RBC 4.68 4.44 4.55  HGB 14.1 12.9 13.4  HCT 47.7* 40.7 41.3  MCV 101.9* 91.7 90.8  MCH 30.1 29.1 29.5  MCHC 29.6* 31.7 32.4  RDW 12.9 13.1 13.0  PLT 133* 163 165    Cardiac EnzymesNo results for input(s): TROPONINI in the last 168 hours.  Recent Labs  Lab 09/03/18 1709  TROPIPOC 0.01     BNPNo results for input(s): BNP, PROBNP in the last 168 hours.   DDimer No results for input(s): DDIMER in the last 168 hours.   Radiology    No results found.  Cardiac Studies   Echo 09/05/2018 LV EF: 35% -   40% Study Conclusions  - Left ventricle: The cavity size was normal. Wall thickness was   normal. Systolic function was moderately reduced. The estimated   ejection fraction was in the range of 35% to 40%.  Impressions:  - Very poor acoustic  windows, even with use of Definity Frequent   ectopy ALl make evaluation of LVEF difficult   Compared to echo images from July 2019, LVEF is worse.    Patient Profile     84 y.o. 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 Prior to that, she was diagnosed with acute R parietal SAH after a fall on 08/30/2018 at [facility].   Assessment & Plan   1. Acute systolic HF/LV dysfuction:  - echo showed EF 35% (previously 40-45%), not on CHF meds due to orthostatic hypotension.   - unclear cause for cardiomyopathy, pacer induced vs CAD. Not a candidate for cath or CRT-P at this time  - amlodipine discontinued and switched to metoprolol. Followup as outpatient, if BP and HR stable, consolidate to toprol XL. No further recommendation. Will discuss with MD regarding whether need to interrogate device prior to D/C.   2. AMS: presented with slurred speech, felt 2/2 possible seizure  3. CAD: nonobstructive by cath in 2011   4. PAF: previously on Xarelto, this has been held in  light of recent small head bleed. Likely hold for 2 weeks, will need to discuss with neuro prior to reinitiate Xarelto. Size of bleed appears to be very small.   5. R parietal SAH: diagnosed at [facility] on 08/30/2018, previous image on 10/23 showed unchanged trace amount of hemorrhage. 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  6. CHB s/ PPM      For questions or updates, please contact [facility] Please consult [email or web address] for contact info under        Signed, Azalee Course, PA  09/06/2018, 10:17 AM

## 2018-09-06 NOTE — Telephone Encounter (Signed)
Per the husband the patient is still in the hospital.

## 2018-09-06 NOTE — Telephone Encounter (Signed)
New Message  [facility] appointment made on 09/22/18 at 1:40p with Dr. Dulce Sellar per Azalee Course

## 2018-09-06 NOTE — Discharge Instructions (Signed)
Ms. Dippolito was admitted to the hospital for confusion, which was likely caused by a seizure that she had at home.  The neurologist would like for her to continue to see them in their clinic.  Their office will reach out to you.  If you do not hear from them within 1-2 weeks, please let your primary doctor know.  Also call Dr. Sol Passer and set up an appointment for the next week.    We changed your blood pressure medications.  You will no longer be taking amlodipine, you will be taking metoprolol twice a day.  You should also continue to go to appointments with your cardiologist.  Your next appointment is scheduled for 11/14.  If your confusion worsens, you have chest pain, or trouble breathing, call your doctor or return to the Emergency Room.

## 2018-09-06 NOTE — Progress Notes (Addendum)
CALL PAGER [phone] for any questions or notifications regarding this patient   FMTS Attending Daily Note: Terisa Starr, MD  Pager [phone]  Office [phone] I have seen and examined this patient, reviewed their chart. I have discussed this patient with the resident. I agree with the resident's findings, assessment and care plan.  HFrEF, greatly appreciate cardiology input.  Will arrange outpatient follow-up with her outpatient cardiologist.  Plan to walk on metoprolol to ensure that she does well with this medication and tolerates this.  History of subarachnoid hemorrhage this has improved on imaging at this time.  We will continue to hold anticoagulant.  Neurology okay with aspirin.  Will resume this at time of discharge.  Will arrange outpatient neurology follow-up.   Family Medicine Teaching Service Daily Progress Note Intern Pager: [phone]  Patient name: [name] [name] Medical record number: [id number] Date of birth: 11/02/1924 Age: 84 y.o. Gender: female  Primary Care Provider: Olive Bass, MD Consultants: Neurology Code Status: full  Pt Overview and Major Events to Date:  10/26 admitted to [facility], neurology consulted, code stroke:   Assessment and Plan: OMYA WINFIELD is a 84 y.o. female presenting with AMS x1day . PMH is significant for HLD, CAD, HTN, Heart block w/ pacemaker, CKD III, paroxysmal atrial fibrillation   Altered mental status, likely 2/2 seizure: Improved EEG abnormal showing posterior background slowing, could be caused by dementia among other things, but no epileptiform activity noted.  Neuro has signed off and believe AMS was 2/2 seizure at home given improvement.  Will follow with Neuro as outpatient.   -Neurology consulted, have signed off -PT - HHPT/OT - HHOT, face-to-face order placed -Cont Pravastatin 20 mg daily - Aspirin 81mg  QD, patient denies allergy -Regular diet - likely d/c home today  Recent subarachnoid hemorrhage Resolved on CT head x2.   Discharged on Keppra from [facility] secondary to bleed.  Neuro recommends continuing keppra and neuro f/u outpatient.  Has outpatient follow-up with neurosurgery as scheduled.   -SCDs for DVT prophylaxis -Continue Keppra 500 mg twice daily -Neurology consulted, have signed off - f/u Neuro as outpatient  Hyperlipidemia Risk and benefits weight for increasing pravastatin to 40mg  per neuro recs.  Given patient's age, will opt to continue current dose. - continue pravastatin 20mg  daily  HFrEF Echo shows newly reduced EF 35%.  Cardiology recommends d/c amlodipine and starting metoprolol tartrate 12.5mg  BID when permissive HTN allowed.  Will start at this time, as out of window for permissive HTN and Neuro stated seizure is most likely etiology of AMS. - start metoprolol tartrate 12.5mg  BID - d/c amlodipine  Hypertension D/C amlodipine per cards, start metoprolol tartrate 12.5mg  BID. - metoprolol tartrate 12.5mg  BID - cont to monitor BP  Heart block with pacemaker placement/ afib Chads-Vasc score 8.  Initially on Xarelto but held due to subarachnoid hemorrhage.  SCDs for DVT prophylaxis. continue cardiac monitoring. -SCDs for DVT prophylaxis; hold anti-coag in setting of SAH -Telemetry - will have cards interrogate device prior to d/c - Outpatient Neurology follow up to determine when/if to restart this   FEN/GI: Regular diet PPx: SCDs  Disposition: Home today  Subjective:  Patient denies complaints this AM.  States that she feels improved to when she came in.  Son at bedside with questions about long term plans, discussed with him.  Objective: Temp:  [97.6 F (36.4 C)-97.7 F (36.5 C)] 97.7 F (36.5 C) (10/29 0804) Pulse Rate:  [52-86] 64 (10/29 0930) Resp:  [17-20] 20 (10/29  8828) BP: (117-146)/(49-91) 146/49 (10/29 0804) SpO2:  [94 %-97 %] 94 % (10/29 0804)  Physical Exam:  General: 84 y.o. female in NAD Cardio: RRR no m/r/g Lungs: CTAB, no wheezing, no rhonchi, no  crackles Abdomen: Soft, non-tender to palpation, positive bowel sounds Skin: warm and dry Extremities: No edema Neuro: A&Ox3, grossly intact    Laboratory: Recent Labs  Lab 09/03/18 1716 09/04/18 0835 09/05/18 0624  WBC 5.4 4.0 4.6  HGB 14.1 12.9 13.4  HCT 47.7* 40.7 41.3  PLT 133* 163 165   Recent Labs  Lab 09/03/18 1716 09/04/18 0509 09/05/18 0624 09/06/18 0337  NA 137 138 137 137  K 3.9 3.7 3.9 3.8  CL 108 106 108 106  CO2 20* 22 22 22   BUN 15 14 14 21   CREATININE 1.17* 0.99 1.01* 1.12*  CALCIUM 9.4 9.3 9.3 9.5  PROT 6.1*  --   --   --   BILITOT 1.1  --   --   --   ALKPHOS 42  --   --   --   ALT 12  --   --   --   AST 27  --   --   --   GLUCOSE 94 95 110* 110*    Imaging/Diagnostic Tests: CLINICAL DATA:  84 year old female status post fall earlier this month with trace subarachnoid hemorrhage. Stroke-like symptoms.  EXAM: CT HEAD WITHOUT CONTRAST  TECHNIQUE: Contiguous axial images were obtained from the base of the skull through the vertex without intravenous contrast.  COMPARISON:  Head CTs 09/03/2018, 08/30/2018, and earlier.  FINDINGS: Brain: Stable cerebral volume. 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 Compare series 5, image 28 today to series 5, image 33 on 12/31/2017.  No subdural space blood is identified. There is no associated midline shift. Minimal if any mass effect on both hemispheres. Basilar cisterns remain normal.  No cortically based acute infarct identified. Chronic left PCA territory encephalomalacia. Stable gray-white matter differentiation throughout the brain.  Vascular: Calcified atherosclerosis at the skull base. No suspicious intracranial vascular hyperdensity.  Skull: Negative.  Sinuses/Orbits:  Visualized paranasal sinuses and mastoids are stable and well pneumatized.  Other: Orbit and scalp soft tissues appear stable and negative.  IMPRESSION: 1.  No acute intracranial abnormality. 2. 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 3. Chronic left PCA encephalomalacia. No new intracranial ischemia is evident.  Meccariello, Solmon Ice, DO 09/06/2018, 9:45 AM PGY-1, [facility] FPTS Intern pager: [phone], text pages welcome

## 2018-09-06 NOTE — Care Management Note (Signed)
Case Management Note  Patient Details  Name: [name] [name] MRN: [id number] Date of Birth: 1924/03/04  Subjective/Objective:                    Action/Plan: Pt discharging to home today with resumption orders for her [facility] services. CM called [facility] yesterday and informed them of admission and of need for resumption. CM faxed them the [facility] and F2F today.  Son and family to provide 24 hour supervision at home.  Son to provide transportation home.   Expected Discharge Date:  09/06/18               Expected Discharge Plan:  Home w Home Health Services  In-House Referral:     Discharge planning Services  CM Consult  Post Acute Care Choice:  Home Health Choice offered to:  Patient, Adult Children  DME Arranged:    DME Agency:     HH Arranged:  PT, OT, Social Work, Charity fundraiser HH Agency:  [facility]  Status of Service:  Completed, signed off  If discussed at [employer] of Stay Meetings, dates discussed:    Additional Comments:  Kermit Balo, RN 09/06/2018, 2:55 PM

## 2018-09-22 ENCOUNTER — Ambulatory Visit: Payer: Medicare HMO | Admitting: Cardiology

## 2018-09-22 NOTE — Progress Notes (Deleted)
Cardiology Office Note:    Date:  09/22/2018   ID:  [name] [name], DOB Apr 03, 1924, MRN [id number]  PCP:  Algis Greenhouse, MD  Cardiologist:  Shirlee More, MD    Referring MD: Algis Greenhouse, MD    ASSESSMENT:    No diagnosis found. PLAN:    In order of problems listed above:  1. ***   Next appointment: ***   Medication Adjustments/Labs and Tests Ordered: Current medicines are reviewed at length with the patient today.  Concerns regarding medicines are outlined above.  No orders of the defined types were placed in this encounter.  No orders of the defined types were placed in this encounter.   No chief complaint on file.   History of Present Illness:    [name] [name] is a 84 y.o. female with a hx of paroxysmal atrial fibrillation, DVT 11/2017 on chronic anticoagulation with low dose xarelto, complete heart block s/p PPM in place (05/2015), hypertensive heart disease without heart failure, orthostatic hypotension, CAD (qeustionable MI in 1975, nonobstuctive cath in 2011), hx of CVA x 2 and chronic diastolic heart failure last seen 04/08/18. In the interim she had a fall without syncope and a traumatic SAH.Marland Kitchen Compliance with diet, lifestyle and medications: *** Past Medical History:  Diagnosis Date  . Acute deep vein thrombosis (DVT) of right peroneal vein 11/30/2017   Overview:  7106: uncomplicated, xarelto  . AKI (acute kidney injury) ([place]) 01/01/2018  . Arthritis   . Atrial fibrillation ([place]) 02/08/2016  . Benign essential HTN 01/15/2016  . Bradycardia with 31 - 40 beats per minute 05/14/2015  . Bronchiectasis, non-tuberculous ([place])    a. Followed by Dr. Joya Gaskins.  . Cerebrovascular accident (CVA) due to thrombosis of right middle cerebral artery ([place]) 02/07/2016   Overview:  2017: R-MCA thrombosis with left non-dom hemiparesis, thalamic capsule ischemia 2018: recurrent  . Chest pain with low risk for cardiac etiology 11/18/2015  . Chronic kidney disease, stage III (moderate)  ([place]) 01/15/2016  . Chronic pain of left knee 12/07/2016  . Closed compression fracture of fourth lumbar vertebra ([place]) 01/19/2017   Overview:  2015  . Closed fracture of ramus of left pubis ([place]) 06/18/2017   Overview:  2018  . Colon polyps   . Complete atrioventricular block ([place]) 01/15/2016   Overview:  2016: PM  . Complete heart block ([place])    a. s/p MDT dual chamber PPM   . COPD (chronic obstructive pulmonary disease) ([place])    past hx with pneumonia  . Coronary artery disease prior MI   . Diverticulosis   . DVT (deep venous thrombosis) ([facility])    Resolved, no anticoagulation currently  . Dyspnea on exertion 10/12/2017   Overview:  2018  . Edema -lower extremity 06/22/2013  . Fatigue 08/19/2012  . GERD (gastroesophageal reflux disease)   . Hammer toe of left foot 06/07/2017  . Headache   . Heart block   . Hemiparesis of left nondominant side as late effect of cerebral infarction ([place]) 12/22/2016  . High cholesterol   . History of pulmonary embolism 08/30/2012   PE 2013  >>repeat CT Angio chest 12/2013>>>NO PE   . Hypertension   . IBS (irritable bowel syndrome)   . Left foot pain 06/07/2017  . Left leg pain 08/03/2016   Overview:  2017: related to weakness left leg from stroke  . Lumbar radiculopathy 07/27/2017  . 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 Malaise and fatigue 10/27/2017  . MI (myocardial infarction) ??  probably not 1975   a. 1975 Reported MI;  b. 2011 Cath: nonobs dzs;  c. 2013 Nonischemic Myoview;  d. 12/2012 Echo: EF 60-65%, Gr1 DD.  . Nephrolithiasis   . Orthostatic syncope 01/01/2018  . Osteoarthritis of right knee 12/12/2012  . Osteoporosis 08/01/2016  . Pain in limb 08/16/2013  . Pain of left femur 06/17/2017   Overview:  2018  . Palpitation    a. 2010 Event Monitor: PACs and PVCs  . Panlobular emphysema ([place]) 01/15/2016  . Paroxysmal supraventricular tachycardia ([place]) 01/15/2016  . Pneumonia   . PONV (postoperative nausea and vomiting)   . Post-dural  puncture headache 09/01/2017  . Presence of cardiac pacemaker 01/16/2016   July 5,2016 Overview:  2016: complete AVB  . Presence of permanent cardiac pacemaker   . Pulmonary fibrosis ([place])   . S/P left knee arthroscopy 04/01/2017  . S/P TKR (total knee replacement) using cement, right 12/07/2016  . Stroke ([place])   . Varicose veins of lower extremities with other complications 00/01/7047  . Vitamin D deficiency 08/01/2016  . Weakness generalized 11/25/2017  . Wears dentures    full  . Wide-complex tachycardia ([place])    a. Followed by Dr. Caryl Comes, "I think this is VT, but i am not sure QRSd 135 1:1 AV."    Past Surgical History:  Procedure Laterality Date  . APPENDECTOMY    . BLADDER SUSPENSION     tack  . CARDIAC CATHETERIZATION Right 05/14/2015   Procedure: Temporary Pacemaker;  Surgeon: Thompson Grayer, MD;  Location: [facility];  Service: Cardiovascular;  Laterality: Right;  . CHOLECYSTECTOMY    . COLON SURGERY     12inches colectomy s/p diverticulitis  . COLONOSCOPY W/ BIOPSIES AND POLYPECTOMY    . EP IMPLANTABLE DEVICE N/A 05/14/2015   Procedure: Pacemaker Implant;  Surgeon: Thompson Grayer, MD; [place] 1 (serial number [id number])     . IR FL GUIDED LOC OF NEEDLE/CATH TIP FOR SPINAL INJECTION LT  09/06/2017  . KNEE ARTHROSCOPY Left 03/29/2017   Procedure: ARTHROSCOPY LEFT KNEE WITH PARTIAL MEDIAL AND PARTIAL LATERAL MENISCECTOMY;  Surgeon: Vickey Huger, MD;  Location: [facility];  Service: Orthopedics;  Laterality: Left;  Marland Kitchen MULTIPLE TOOTH EXTRACTIONS    . TONSILLECTOMY    . TOTAL ABDOMINAL HYSTERECTOMY    . TOTAL KNEE ARTHROPLASTY  12/12/2012   Procedure: TOTAL KNEE ARTHROPLASTY;  Surgeon: Alta Corning, MD;  Location: [facility];  Service: Orthopedics;  Laterality: Right;  right total knee arthroplasty    Current Medications: No outpatient medications have been marked as taking for the 09/22/18 encounter (Appointment) with Richardo Priest, MD.     Allergies:   Crestor  [rosuvastatin]; Darvon; Meloxicam; Meperidine; Prednisone; Valsartan; Amiodarone; Aspirin-acetaminophen-caffeine; Nitroglycerin; and Tramadol   Social History   Socioeconomic History  . Marital status: Married    Spouse name: Not on file  . Number of children: 3  . Years of education: Not on file  . Highest education level: Not on file  Occupational History  . Occupation: Retired    Comment: Musician  Social Needs  . Financial resource strain: Not on file  . Food insecurity:    Worry: Not on file    Inability: Not on file  . Transportation needs:    Medical: Not on file    Non-medical: Not on file  Tobacco Use  . Smoking status: Never Smoker  . Smokeless tobacco: Never Used  Substance and Sexual Activity  .  Alcohol use: No  . Drug use: No  . Sexual activity: Not on file  Lifestyle  . Physical activity:    Days per week: Not on file    Minutes per session: Not on file  . Stress: Not on file  Relationships  . Social connections:    Talks on phone: Not on file    Gets together: Not on file    Attends religious service: Not on file    Active member of club or organization: Not on file    Attends meetings of clubs or organizations: Not on file    Relationship status: Not on file  Other Topics Concern  . Not on file  Social History Narrative  . Not on file     Family History: The patient's ***family history includes Heart attack in her mother; Heart disease in her father; Other in her son; Stomach cancer in her brother. ROS:   Please see the history of present illness.    All other systems reviewed and are negative.  EKGs/Labs/Other Studies Reviewed:    The following studies were reviewed today:  EKG:  EKG ordered today.  The ekg ordered today demonstrates ***  Recent Labs: 01/18/2018: B Natriuretic Peptide 40.5 05/18/2018: TSH 1.438 09/03/2018: ALT 12 09/05/2018: Hemoglobin 13.4; Platelets 165 09/06/2018: BUN 21; Creatinine, Ser 1.12; Potassium  3.8; Sodium 137  Recent Lipid Panel    Component Value Date/Time   CHOL 163 09/04/2018 0509   TRIG 77 09/04/2018 0509   HDL 42 09/04/2018 0509   CHOLHDL 3.9 09/04/2018 0509   VLDL 15 09/04/2018 0509   LDLCALC 106 (H) 09/04/2018 0509    Physical Exam:    VS:  There were no vitals taken for this visit.    Wt Readings from Last 3 Encounters:  09/04/18 137 lb 2 oz (62.2 kg)  05/19/18 135 lb 12.9 oz (61.6 kg)  04/08/18 137 lb (62.1 kg)     GEN: *** Well nourished, well developed in no acute distress HEENT: Normal NECK: No JVD; No carotid bruits LYMPHATICS: No lymphadenopathy CARDIAC: ***RRR, no murmurs, rubs, gallops RESPIRATORY:  Clear to auscultation without rales, wheezing or rhonchi  ABDOMEN: Soft, non-tender, non-distended MUSCULOSKELETAL:  No edema; No deformity  SKIN: Warm and dry NEUROLOGIC:  Alert and oriented x 3 PSYCHIATRIC:  Normal affect    Signed, Shirlee More, MD  09/22/2018 7:52 AM    [place]

## 2018-09-27 ENCOUNTER — Other Ambulatory Visit: Payer: Self-pay | Admitting: Family Medicine

## 2018-10-03 ENCOUNTER — Ambulatory Visit (INDEPENDENT_AMBULATORY_CARE_PROVIDER_SITE_OTHER): Payer: Medicare HMO

## 2018-10-03 DIAGNOSIS — I442 Atrioventricular block, complete: Secondary | ICD-10-CM | POA: Diagnosis not present

## 2018-10-03 DIAGNOSIS — I5032 Chronic diastolic (congestive) heart failure: Secondary | ICD-10-CM

## 2018-10-03 NOTE — Progress Notes (Signed)
Remote pacemaker transmission.   

## 2018-10-05 ENCOUNTER — Ambulatory Visit: Payer: Medicare HMO | Admitting: Cardiology

## 2018-10-10 ENCOUNTER — Encounter

## 2018-10-10 ENCOUNTER — Ambulatory Visit: Payer: Medicare HMO | Admitting: Neurology

## 2018-10-25 ENCOUNTER — Other Ambulatory Visit (HOSPITAL_COMMUNITY): Payer: Self-pay | Admitting: Family Medicine

## 2018-10-27 ENCOUNTER — Other Ambulatory Visit (HOSPITAL_COMMUNITY): Payer: Self-pay | Admitting: Family Medicine

## 2018-10-27 IMAGING — DX DG CHEST 2V
2 series · 2 of 2 positions shown · non-contrast
Comparison: 06/27/2017

CLINICAL DATA: Mid sternal chest pain

EXAM:
CHEST  2 VIEW

[chest lat]
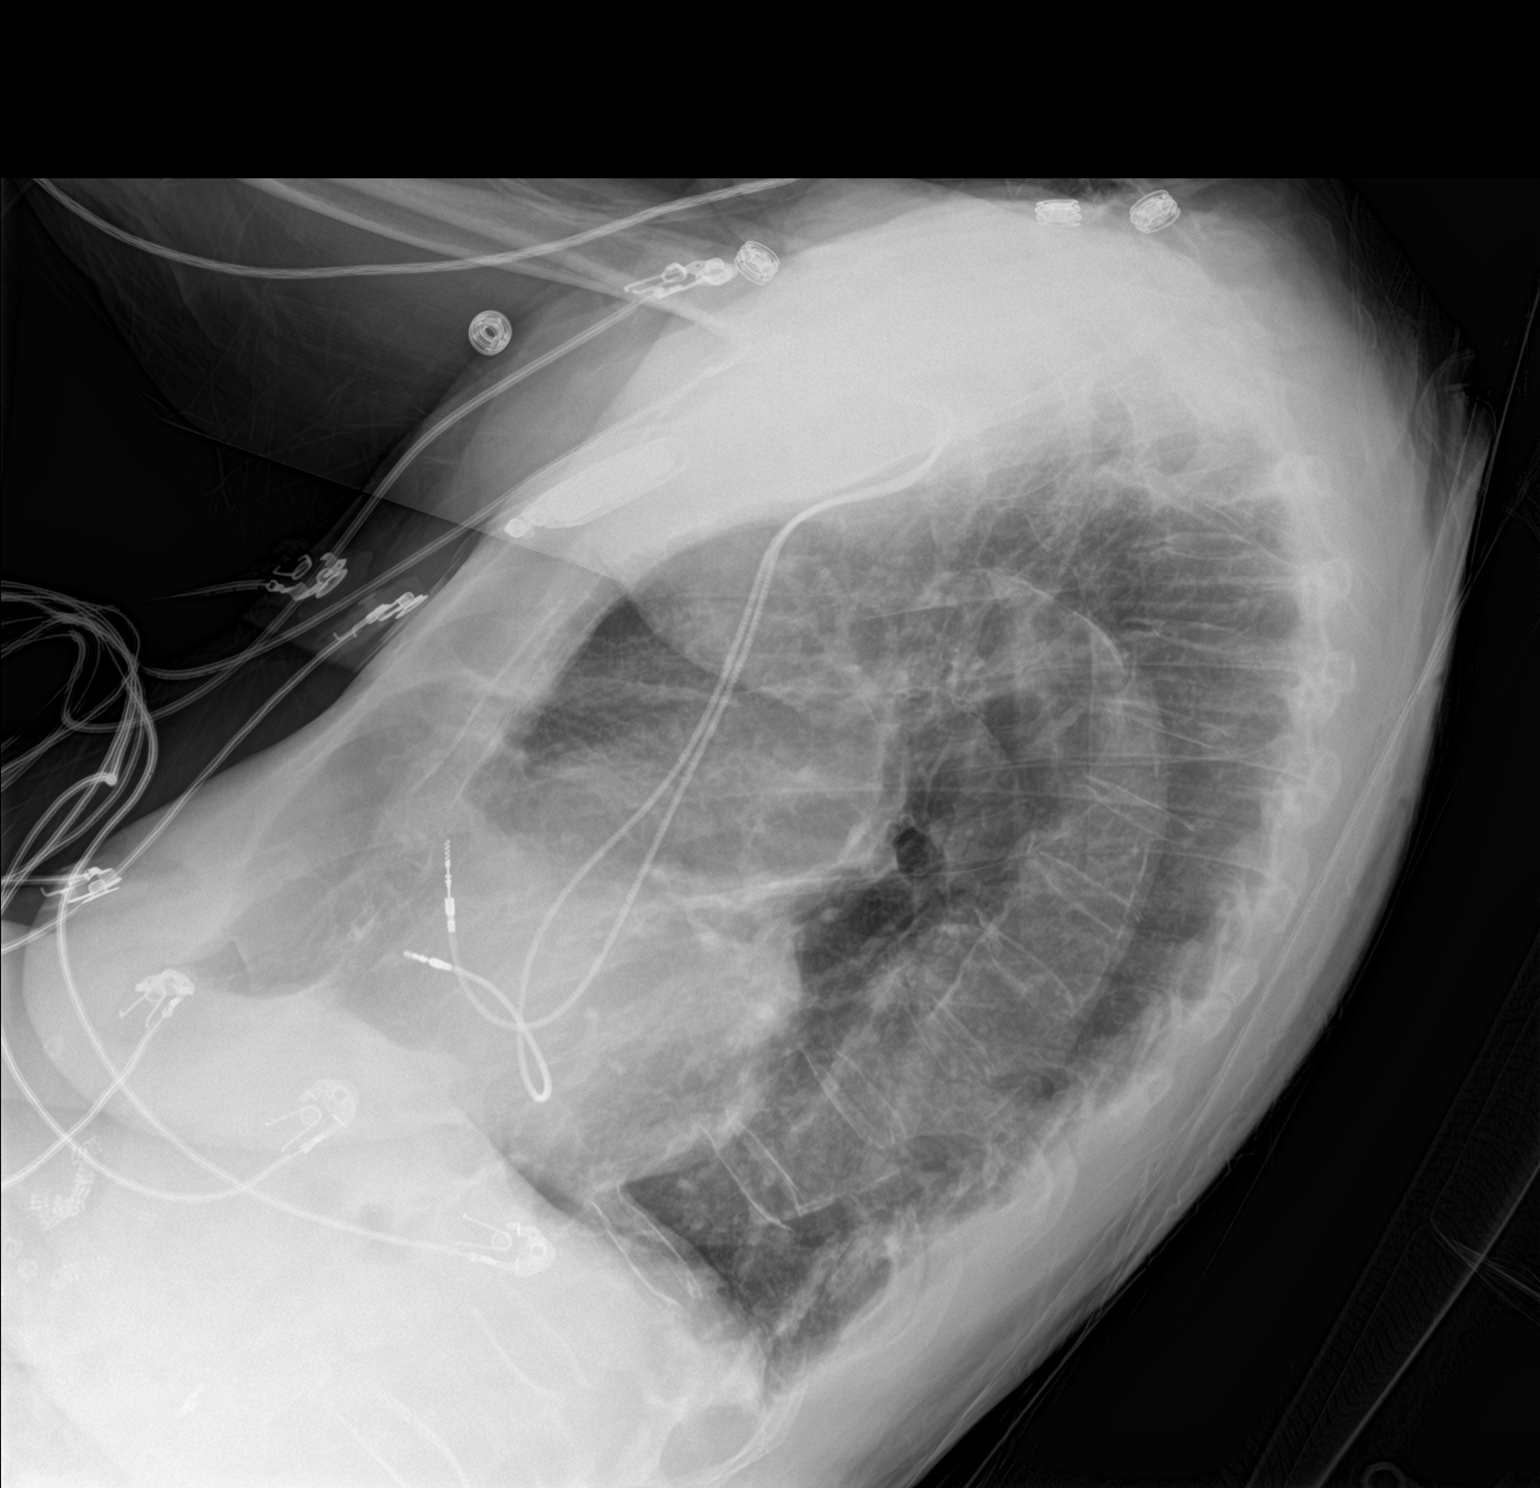

[chest ap]
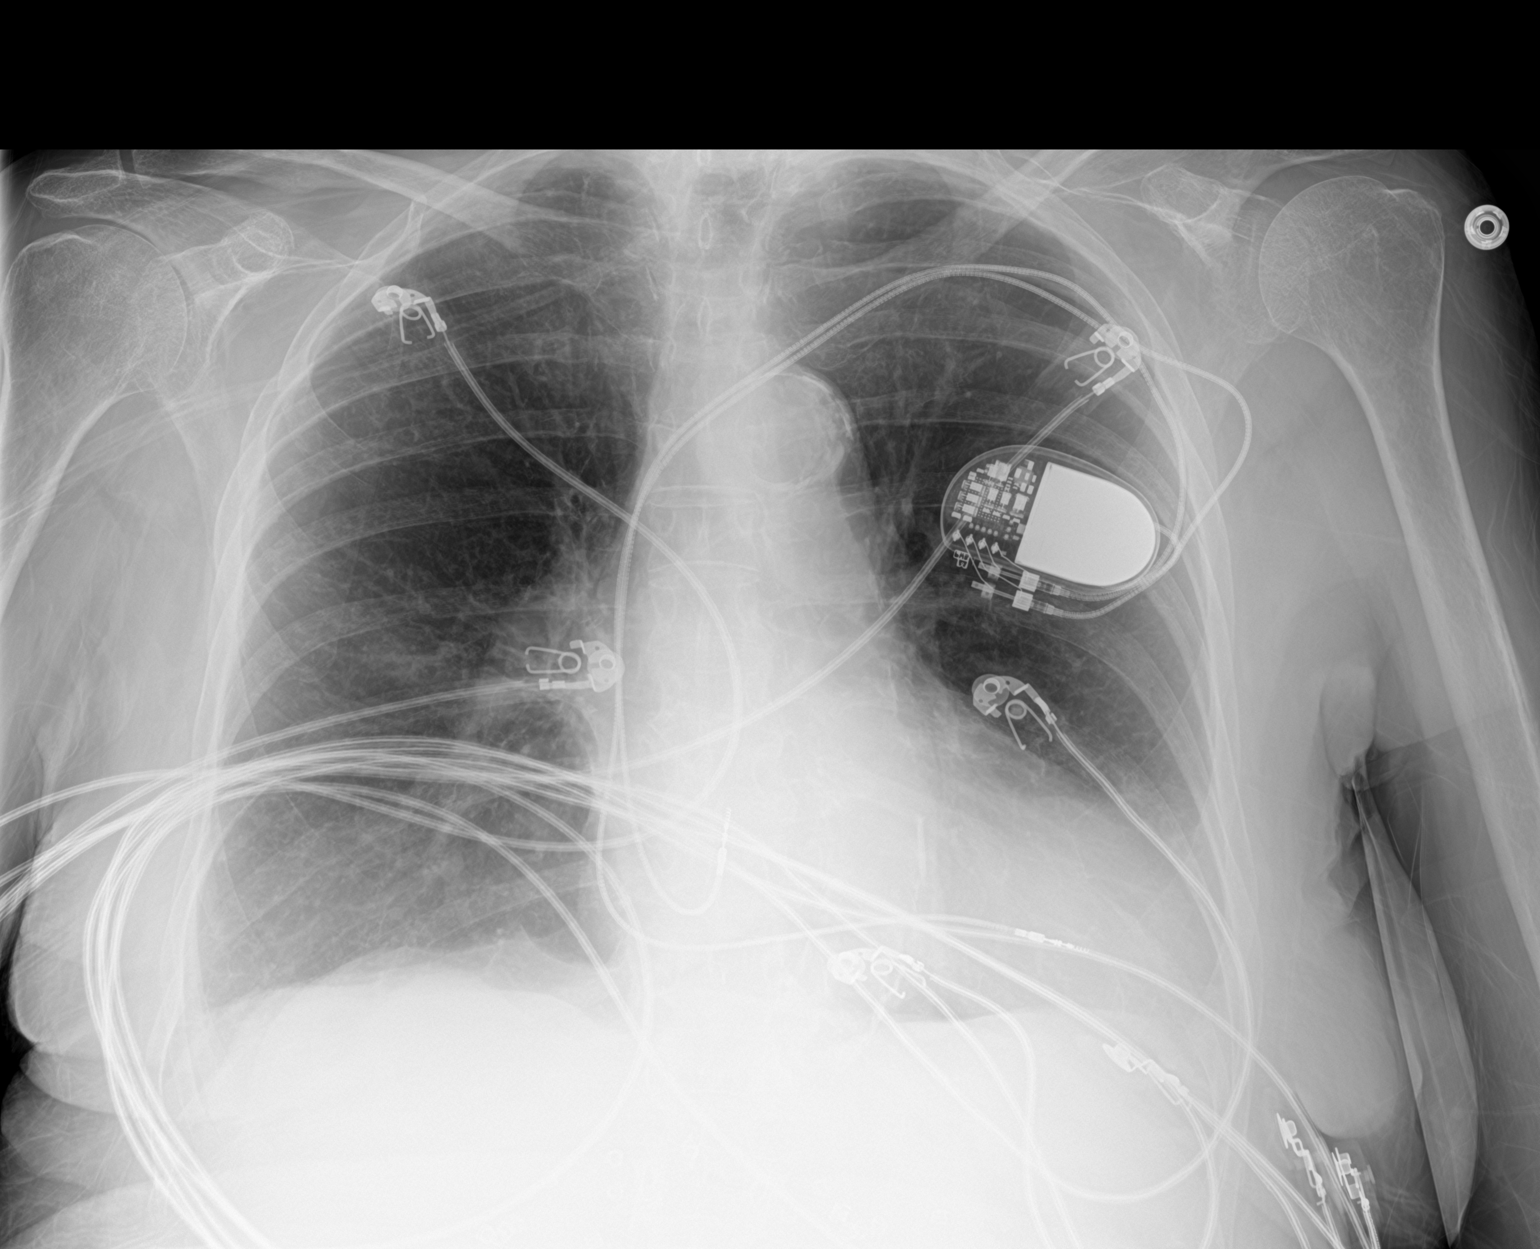

[2 of 2 positions shown; findings below may reference images not displayed]

FINDINGS: Mild hyperinflation with minimal scarring at the bases. No focal
consolidation or effusion. Mild cardiomegaly with aortic
atherosclerosis. No pneumothorax.
IMPRESSION: No active cardiopulmonary disease.  Mild cardiomegaly

## 2018-10-27 NOTE — Telephone Encounter (Signed)
I am not this patient's PCP.  Please let patient and pharmacy know that her PCP needs to be contacted to refill Rx.

## 2018-11-25 LAB — CUP PACEART REMOTE DEVICE CHECK
Battery Impedance: 228 Ohm
Brady Statistic AP VP Percent: 44 %
Brady Statistic AP VS Percent: 0 %
Brady Statistic AS VP Percent: 55 %
Brady Statistic AS VS Percent: 1 %
Date Time Interrogation Session: 20191125131532
Implantable Lead Implant Date: 20160705
Implantable Lead Location: 753859
Implantable Lead Location: 753860
Implantable Pulse Generator Implant Date: 20160705
Lead Channel Impedance Value: 507 Ohm
Lead Channel Impedance Value: 524 Ohm
Lead Channel Pacing Threshold Amplitude: 0.625 V
Lead Channel Pacing Threshold Pulse Width: 0.4 ms
Lead Channel Setting Pacing Amplitude: 2.5 V
Lead Channel Setting Pacing Pulse Width: 0.4 ms
MDC IDC LEAD IMPLANT DT: 20160705
MDC IDC MSMT BATTERY REMAINING LONGEVITY: 105 mo
MDC IDC MSMT BATTERY VOLTAGE: 2.79 V
MDC IDC MSMT LEADCHNL RV PACING THRESHOLD AMPLITUDE: 0.5 V
MDC IDC MSMT LEADCHNL RV PACING THRESHOLD PULSEWIDTH: 0.4 ms
MDC IDC SET LEADCHNL RA PACING AMPLITUDE: 2 V
MDC IDC SET LEADCHNL RV SENSING SENSITIVITY: 4 mV

## 2018-11-30 ENCOUNTER — Ambulatory Visit: Payer: Medicare HMO | Admitting: Cardiology

## 2019-01-02 ENCOUNTER — Encounter: Payer: Medicare HMO | Admitting: *Deleted

## 2019-01-04 ENCOUNTER — Telehealth: Payer: Self-pay

## 2019-01-04 NOTE — Telephone Encounter (Signed)
Unable to leave a message for patient to remind of missed remote transmission.  

## 2019-01-11 ENCOUNTER — Encounter: Payer: Self-pay | Admitting: Cardiology

## 2019-02-04 DIAGNOSIS — S32599A Other specified fracture of unspecified pubis, initial encounter for closed fracture: Secondary | ICD-10-CM

## 2019-02-04 DIAGNOSIS — Z86711 Personal history of pulmonary embolism: Secondary | ICD-10-CM

## 2019-02-04 DIAGNOSIS — I2510 Atherosclerotic heart disease of native coronary artery without angina pectoris: Secondary | ICD-10-CM

## 2019-02-04 DIAGNOSIS — J8410 Pulmonary fibrosis, unspecified: Secondary | ICD-10-CM

## 2019-02-04 DIAGNOSIS — I480 Paroxysmal atrial fibrillation: Secondary | ICD-10-CM

## 2019-02-04 DIAGNOSIS — I10 Essential (primary) hypertension: Secondary | ICD-10-CM

## 2019-02-04 DIAGNOSIS — N179 Acute kidney failure, unspecified: Secondary | ICD-10-CM

## 2019-02-05 DIAGNOSIS — I10 Essential (primary) hypertension: Secondary | ICD-10-CM | POA: Diagnosis not present

## 2019-02-05 DIAGNOSIS — Z86711 Personal history of pulmonary embolism: Secondary | ICD-10-CM | POA: Diagnosis not present

## 2019-02-05 DIAGNOSIS — I480 Paroxysmal atrial fibrillation: Secondary | ICD-10-CM | POA: Diagnosis not present

## 2019-02-05 DIAGNOSIS — S32599A Other specified fracture of unspecified pubis, initial encounter for closed fracture: Secondary | ICD-10-CM | POA: Diagnosis not present

## 2019-02-06 DIAGNOSIS — I10 Essential (primary) hypertension: Secondary | ICD-10-CM | POA: Diagnosis not present

## 2019-02-06 DIAGNOSIS — Z86711 Personal history of pulmonary embolism: Secondary | ICD-10-CM | POA: Diagnosis not present

## 2019-02-06 DIAGNOSIS — S32599A Other specified fracture of unspecified pubis, initial encounter for closed fracture: Secondary | ICD-10-CM | POA: Diagnosis not present

## 2019-02-06 DIAGNOSIS — I480 Paroxysmal atrial fibrillation: Secondary | ICD-10-CM | POA: Diagnosis not present

## 2019-02-07 DIAGNOSIS — Z86711 Personal history of pulmonary embolism: Secondary | ICD-10-CM | POA: Diagnosis not present

## 2019-02-07 DIAGNOSIS — I10 Essential (primary) hypertension: Secondary | ICD-10-CM | POA: Diagnosis not present

## 2019-02-07 DIAGNOSIS — S32599A Other specified fracture of unspecified pubis, initial encounter for closed fracture: Secondary | ICD-10-CM | POA: Diagnosis not present

## 2019-02-07 DIAGNOSIS — I480 Paroxysmal atrial fibrillation: Secondary | ICD-10-CM | POA: Diagnosis not present

## 2019-03-27 ENCOUNTER — Ambulatory Visit (INDEPENDENT_AMBULATORY_CARE_PROVIDER_SITE_OTHER): Payer: Medicare HMO | Admitting: *Deleted

## 2019-03-27 ENCOUNTER — Other Ambulatory Visit: Payer: Self-pay

## 2019-03-27 DIAGNOSIS — I442 Atrioventricular block, complete: Secondary | ICD-10-CM | POA: Diagnosis not present

## 2019-03-28 LAB — CUP PACEART REMOTE DEVICE CHECK
Battery Impedance: 276 Ohm
Battery Remaining Longevity: 101 mo
Battery Voltage: 2.78 V
Brady Statistic AP VP Percent: 45 %
Brady Statistic AP VS Percent: 0 %
Brady Statistic AS VP Percent: 54 %
Brady Statistic AS VS Percent: 1 %
Date Time Interrogation Session: 20200518122858
Implantable Lead Implant Date: 20160705
Implantable Lead Implant Date: 20160705
Implantable Lead Location: 753859
Implantable Lead Location: 753860
Implantable Lead Model: 5076
Implantable Lead Model: 5076
Implantable Pulse Generator Implant Date: 20160705
Lead Channel Impedance Value: 530 Ohm
Lead Channel Impedance Value: 545 Ohm
Lead Channel Pacing Threshold Amplitude: 0.5 V
Lead Channel Pacing Threshold Amplitude: 0.5 V
Lead Channel Pacing Threshold Pulse Width: 0.4 ms
Lead Channel Pacing Threshold Pulse Width: 0.4 ms
Lead Channel Setting Pacing Amplitude: 2 V
Lead Channel Setting Pacing Amplitude: 2.5 V
Lead Channel Setting Pacing Pulse Width: 0.4 ms
Lead Channel Setting Sensing Sensitivity: 4 mV

## 2019-04-04 ENCOUNTER — Encounter: Payer: Self-pay | Admitting: Cardiology

## 2019-04-04 NOTE — Progress Notes (Signed)
Remote pacemaker transmission.   

## 2019-04-19 ENCOUNTER — Telehealth: Payer: Self-pay | Admitting: Internal Medicine

## 2019-04-19 ENCOUNTER — Telehealth: Payer: Self-pay

## 2019-04-19 NOTE — Telephone Encounter (Signed)
    COVID-19 Pre-Screening Questions:  . In the past 7 to 10 days have you had a cough,  shortness of breath, headache, congestion, fever (100 or greater) body aches, chills, sore throat, or sudden loss of taste or sense of smell? . Have you been around anyone with known Covid 19. . Have you been around anyone who is awaiting Covid 19 test results in the past 7 to 10 days? . Have you been around anyone who has been exposed to Covid 19, or has mentioned symptoms of Covid 19 within the past 7 to 10 days?  If you have any concerns/questions about symptoms patients report during screening (either on the phone or at threshold). Contact the provider seeing the patient or DOD for further guidance.  If neither are available contact a member of the leadership team.  The pt answered No to all Covid-19 questions. I advised the pt to wear a mask if she has one. I also advised her to come to the appointment alone if she do not need help. If she due only to bring 1 person and have the family member wear a mask. The pt verbalized understanding.

## 2019-04-19 NOTE — Telephone Encounter (Signed)
New message:   Patient calling concering her pacemaker and it looks like a burn around it patient states she needs appt. Please call patient.

## 2019-04-19 NOTE — Telephone Encounter (Signed)
Spoke with patient. She states that a couple of weeks ago she heard a "pop" at her pacemaker site and since then has had progressive weakness and shortness of breath.  Her PCP asked that she be seen in our office for further evaluation. I have asked her to send a remote transmission today for evaluation and made appt for tomorrow with the device clinic.   Chanetta Marshall, NP 04/19/2019 10:22 AM

## 2019-04-20 ENCOUNTER — Other Ambulatory Visit: Payer: Self-pay

## 2019-04-20 ENCOUNTER — Ambulatory Visit (INDEPENDENT_AMBULATORY_CARE_PROVIDER_SITE_OTHER): Payer: Medicare HMO | Admitting: Student

## 2019-04-20 DIAGNOSIS — Z950 Presence of cardiac pacemaker: Secondary | ICD-10-CM | POA: Diagnosis not present

## 2019-04-20 DIAGNOSIS — L989 Disorder of the skin and subcutaneous tissue, unspecified: Secondary | ICD-10-CM

## 2019-04-20 LAB — CUP PACEART INCLINIC DEVICE CHECK
Battery Impedance: 276 Ohm
Battery Remaining Longevity: 100 mo
Battery Voltage: 2.78 V
Brady Statistic AP VP Percent: 45 %
Brady Statistic AP VS Percent: 0 %
Brady Statistic AS VP Percent: 54 %
Brady Statistic AS VS Percent: 1 %
Date Time Interrogation Session: 20200611153337
Implantable Lead Implant Date: 20160705
Implantable Lead Implant Date: 20160705
Implantable Lead Location: 753859
Implantable Lead Location: 753860
Implantable Lead Model: 5076
Implantable Lead Model: 5076
Implantable Pulse Generator Implant Date: 20160705
Lead Channel Impedance Value: 494 Ohm
Lead Channel Impedance Value: 527 Ohm
Lead Channel Pacing Threshold Amplitude: 0.5 V
Lead Channel Pacing Threshold Amplitude: 0.5 V
Lead Channel Pacing Threshold Amplitude: 0.5 V
Lead Channel Pacing Threshold Amplitude: 0.5 V
Lead Channel Pacing Threshold Pulse Width: 0.4 ms
Lead Channel Pacing Threshold Pulse Width: 0.4 ms
Lead Channel Pacing Threshold Pulse Width: 0.4 ms
Lead Channel Pacing Threshold Pulse Width: 0.4 ms
Lead Channel Sensing Intrinsic Amplitude: 2 mV
Lead Channel Setting Pacing Amplitude: 2 V
Lead Channel Setting Pacing Amplitude: 2.5 V
Lead Channel Setting Pacing Pulse Width: 0.4 ms
Lead Channel Setting Sensing Sensitivity: 4 mV

## 2019-04-20 NOTE — Progress Notes (Signed)
  Pt seen in clinic today for a lesion over her device.   She states it has been there for a couple of weeks. Around that time she thought she heard a "pop" and felt a burning sensation, but she was also on the brink of falling asleep, so is not sure if it was associated with the lesion or not.   The lesion is not tethered to the device, and moves freely across the top when palpated. The device is tacked down and does not move freely within its pocket.  Her PPM is functionally normal, with stable impedence and thresholds. Pt has history of multiple skin lesions removed by dermatology, the most recent on her R neck.   Discussed lesion with Dr. Caryl Comes who reviewed picture below. Lack of tethering points more likely towards a squamous cell lesion than an inflammatory or infectious process. Recommended prompt follow up with dermatology and coordination with EP for any procedures involving the area so close to her PPM.    Legrand Como "[employer], PA-C 04/20/2019 3:24 PM

## 2019-04-20 NOTE — Telephone Encounter (Signed)
Photo noted

## 2019-06-27 ENCOUNTER — Encounter: Payer: Medicare HMO | Admitting: *Deleted

## 2019-07-05 ENCOUNTER — Encounter: Payer: Self-pay | Admitting: Cardiology

## 2019-07-14 ENCOUNTER — Ambulatory Visit (INDEPENDENT_AMBULATORY_CARE_PROVIDER_SITE_OTHER): Payer: Medicare HMO | Admitting: *Deleted

## 2019-07-14 DIAGNOSIS — I442 Atrioventricular block, complete: Secondary | ICD-10-CM | POA: Diagnosis not present

## 2019-07-18 LAB — CUP PACEART REMOTE DEVICE CHECK
Battery Impedance: 276 Ohm
Battery Remaining Longevity: 98 mo
Battery Voltage: 2.79 V
Brady Statistic AP VP Percent: 54 %
Brady Statistic AP VS Percent: 0 %
Brady Statistic AS VP Percent: 46 %
Brady Statistic AS VS Percent: 0 %
Date Time Interrogation Session: 20200907124848
Implantable Lead Implant Date: 20160705
Implantable Lead Implant Date: 20160705
Implantable Lead Location: 753859
Implantable Lead Location: 753860
Implantable Lead Model: 5076
Implantable Lead Model: 5076
Implantable Pulse Generator Implant Date: 20160705
Lead Channel Impedance Value: 479 Ohm
Lead Channel Impedance Value: 491 Ohm
Lead Channel Pacing Threshold Amplitude: 0.5 V
Lead Channel Pacing Threshold Amplitude: 0.5 V
Lead Channel Pacing Threshold Pulse Width: 0.4 ms
Lead Channel Pacing Threshold Pulse Width: 0.4 ms
Lead Channel Setting Pacing Amplitude: 2 V
Lead Channel Setting Pacing Amplitude: 2.5 V
Lead Channel Setting Pacing Pulse Width: 0.4 ms
Lead Channel Setting Sensing Sensitivity: 4 mV

## 2019-07-26 ENCOUNTER — Encounter: Payer: Self-pay | Admitting: Cardiology

## 2019-07-26 NOTE — Progress Notes (Signed)
Remote pacemaker transmission.   

## 2019-08-29 DIAGNOSIS — R079 Chest pain, unspecified: Secondary | ICD-10-CM | POA: Diagnosis not present

## 2019-08-29 DIAGNOSIS — I160 Hypertensive urgency: Secondary | ICD-10-CM | POA: Diagnosis not present

## 2019-08-29 DIAGNOSIS — E785 Hyperlipidemia, unspecified: Secondary | ICD-10-CM | POA: Diagnosis not present

## 2019-08-29 DIAGNOSIS — R531 Weakness: Secondary | ICD-10-CM | POA: Diagnosis not present

## 2019-08-30 DIAGNOSIS — R531 Weakness: Secondary | ICD-10-CM | POA: Diagnosis not present

## 2019-08-30 DIAGNOSIS — I160 Hypertensive urgency: Secondary | ICD-10-CM | POA: Diagnosis not present

## 2019-08-30 DIAGNOSIS — E785 Hyperlipidemia, unspecified: Secondary | ICD-10-CM | POA: Diagnosis not present

## 2019-08-30 DIAGNOSIS — R079 Chest pain, unspecified: Secondary | ICD-10-CM | POA: Diagnosis not present

## 2019-08-31 DIAGNOSIS — R079 Chest pain, unspecified: Secondary | ICD-10-CM | POA: Diagnosis not present

## 2019-08-31 DIAGNOSIS — I160 Hypertensive urgency: Secondary | ICD-10-CM | POA: Diagnosis not present

## 2019-08-31 DIAGNOSIS — E785 Hyperlipidemia, unspecified: Secondary | ICD-10-CM | POA: Diagnosis not present

## 2019-08-31 DIAGNOSIS — R531 Weakness: Secondary | ICD-10-CM | POA: Diagnosis not present

## 2019-10-13 ENCOUNTER — Ambulatory Visit (INDEPENDENT_AMBULATORY_CARE_PROVIDER_SITE_OTHER): Payer: Medicare HMO | Admitting: *Deleted

## 2019-10-13 DIAGNOSIS — R001 Bradycardia, unspecified: Secondary | ICD-10-CM

## 2019-10-14 LAB — CUP PACEART REMOTE DEVICE CHECK
Battery Impedance: 300 Ohm
Battery Remaining Longevity: 97 mo
Battery Voltage: 2.78 V
Brady Statistic AP VP Percent: 57 %
Brady Statistic AP VS Percent: 0 %
Brady Statistic AS VP Percent: 43 %
Brady Statistic AS VS Percent: 0 %
Date Time Interrogation Session: 20201204143726
Implantable Lead Implant Date: 20160705
Implantable Lead Implant Date: 20160705
Implantable Lead Location: 753859
Implantable Lead Location: 753860
Implantable Lead Model: 5076
Implantable Lead Model: 5076
Implantable Pulse Generator Implant Date: 20160705
Lead Channel Impedance Value: 515 Ohm
Lead Channel Impedance Value: 545 Ohm
Lead Channel Pacing Threshold Amplitude: 0.5 V
Lead Channel Pacing Threshold Amplitude: 0.5 V
Lead Channel Pacing Threshold Pulse Width: 0.4 ms
Lead Channel Pacing Threshold Pulse Width: 0.4 ms
Lead Channel Setting Pacing Amplitude: 2 V
Lead Channel Setting Pacing Amplitude: 2.5 V
Lead Channel Setting Pacing Pulse Width: 0.4 ms
Lead Channel Setting Sensing Sensitivity: 4 mV

## 2019-12-09 ENCOUNTER — Other Ambulatory Visit: Payer: Self-pay

## 2019-12-09 ENCOUNTER — Emergency Department (HOSPITAL_COMMUNITY): Payer: Medicare HMO

## 2019-12-09 ENCOUNTER — Emergency Department (HOSPITAL_COMMUNITY)
Admission: EM | Admit: 2019-12-09 | Discharge: 2019-12-09 | Disposition: A | Discharge status: Home / Self Care | Payer: Medicare HMO | Attending: Emergency Medicine | Admitting: Emergency Medicine

## 2019-12-09 ENCOUNTER — Encounter (HOSPITAL_COMMUNITY): Payer: Self-pay | Admitting: Emergency Medicine

## 2019-12-09 DIAGNOSIS — M533 Sacrococcygeal disorders, not elsewhere classified: Secondary | ICD-10-CM | POA: Insufficient documentation

## 2019-12-09 DIAGNOSIS — J449 Chronic obstructive pulmonary disease, unspecified: Secondary | ICD-10-CM | POA: Insufficient documentation

## 2019-12-09 DIAGNOSIS — Z950 Presence of cardiac pacemaker: Secondary | ICD-10-CM | POA: Insufficient documentation

## 2019-12-09 DIAGNOSIS — S32512A Fracture of superior rim of left pubis, initial encounter for closed fracture: Secondary | ICD-10-CM | POA: Diagnosis not present

## 2019-12-09 DIAGNOSIS — Z8673 Personal history of transient ischemic attack (TIA), and cerebral infarction without residual deficits: Secondary | ICD-10-CM | POA: Insufficient documentation

## 2019-12-09 DIAGNOSIS — Y9389 Activity, other specified: Secondary | ICD-10-CM | POA: Diagnosis not present

## 2019-12-09 DIAGNOSIS — I252 Old myocardial infarction: Secondary | ICD-10-CM | POA: Diagnosis not present

## 2019-12-09 DIAGNOSIS — N183 Chronic kidney disease, stage 3 unspecified: Secondary | ICD-10-CM | POA: Insufficient documentation

## 2019-12-09 DIAGNOSIS — Z7982 Long term (current) use of aspirin: Secondary | ICD-10-CM | POA: Diagnosis not present

## 2019-12-09 DIAGNOSIS — Z96651 Presence of right artificial knee joint: Secondary | ICD-10-CM | POA: Insufficient documentation

## 2019-12-09 DIAGNOSIS — I5032 Chronic diastolic (congestive) heart failure: Secondary | ICD-10-CM | POA: Insufficient documentation

## 2019-12-09 DIAGNOSIS — W010XXA Fall on same level from slipping, tripping and stumbling without subsequent striking against object, initial encounter: Secondary | ICD-10-CM | POA: Diagnosis not present

## 2019-12-09 DIAGNOSIS — I130 Hypertensive heart and chronic kidney disease with heart failure and stage 1 through stage 4 chronic kidney disease, or unspecified chronic kidney disease: Secondary | ICD-10-CM | POA: Diagnosis not present

## 2019-12-09 DIAGNOSIS — S79912A Unspecified injury of left hip, initial encounter: Secondary | ICD-10-CM | POA: Diagnosis present

## 2019-12-09 DIAGNOSIS — W19XXXA Unspecified fall, initial encounter: Secondary | ICD-10-CM

## 2019-12-09 DIAGNOSIS — Y92018 Other place in single-family (private) house as the place of occurrence of the external cause: Secondary | ICD-10-CM | POA: Insufficient documentation

## 2019-12-09 DIAGNOSIS — Z79899 Other long term (current) drug therapy: Secondary | ICD-10-CM | POA: Diagnosis not present

## 2019-12-09 DIAGNOSIS — Y998 Other external cause status: Secondary | ICD-10-CM | POA: Diagnosis not present

## 2019-12-09 DIAGNOSIS — Z20822 Contact with and (suspected) exposure to covid-19: Secondary | ICD-10-CM | POA: Diagnosis not present

## 2019-12-09 LAB — RESPIRATORY PANEL BY RT PCR (FLU A&B, COVID)
Influenza A by PCR: NEGATIVE
Influenza B by PCR: NEGATIVE
SARS Coronavirus 2 by RT PCR: NEGATIVE

## 2019-12-09 LAB — BASIC METABOLIC PANEL
Anion gap: 12 (ref 5–15)
BUN: 22 mg/dL (ref 8–23)
CO2: 23 mmol/L (ref 22–32)
Calcium: 9.6 mg/dL (ref 8.9–10.3)
Chloride: 105 mmol/L (ref 98–111)
Creatinine, Ser: 1.45 mg/dL — ABNORMAL HIGH (ref 0.44–1.00)
GFR calc Af Amer: 36 mL/min — ABNORMAL LOW (ref >60)
GFR calc non Af Amer: 31 mL/min — ABNORMAL LOW (ref >60)
Glucose, Bld: 96 mg/dL (ref 70–99)
Potassium: 4.4 mmol/L (ref 3.5–5.1)
Sodium: 140 mmol/L (ref 135–145)

## 2019-12-09 LAB — CBC
HCT: 40.7 % (ref 36.0–46.0)
Hemoglobin: 13.3 g/dL (ref 12.0–15.0)
MCH: 31.2 pg (ref 26.0–34.0)
MCHC: 32.7 g/dL (ref 30.0–36.0)
MCV: 95.5 fL (ref 80.0–100.0)
Platelets: 156 10*3/uL (ref 150–400)
RBC: 4.26 MIL/uL (ref 3.87–5.11)
RDW: 13 % (ref 11.5–15.5)
WBC: 7.3 10*3/uL (ref 4.0–10.5)
nRBC: 0 % (ref 0.0–0.2)

## 2019-12-09 MED ORDER — ACETAMINOPHEN 325 MG PO TABS
650.0000 mg | ORAL_TABLET | Freq: Once | ORAL | Status: AC
  Administered 2019-12-09: 650 mg via ORAL
  Filled 2019-12-09: qty 2

## 2019-12-09 NOTE — ED Notes (Signed)
Pt was able to ambulate with walker without any other assistance

## 2019-12-09 NOTE — ED Notes (Signed)
Called ptar at 14:29

## 2019-12-09 NOTE — ED Notes (Signed)
All appropriate discharge materials reviewed at length with patient. Time for questions provided. Pt has no other questions at this time and verbalizes understanding of all provided materials.  

## 2019-12-09 NOTE — ED Triage Notes (Signed)
Per PTAR pt coming from home after having a fall in her closet from a standing position. Patient states her left leg gave out causing her to fall. C/o left hip and coccyx pain.

## 2019-12-09 NOTE — ED Provider Notes (Signed)
[facility] EMERGENCY DEPARTMENT Provider Note   CSN: [id number] Arrival date & time: 12/09/19  1201     History Chief Complaint  Patient presents with  . Fall    [name] [name] is a 86 y.o. female.  HPI   86 year old female history of a fib, stroke, presents today complaining of left hip pain after fall.  She states that she was reaching for something in her closet when her weak leg gave out.  She states her left leg was weak from previous prior stroke.  She fell onto that left hip.  She is having some pain in the left hip.  She did not get up from the floor.  EMS was called and transported her here.  She denies striking her head, neck pain, or back pain.  She does complain of some pain in the sacrum.  Denies any other injury.  It is unclear she is on anticoagulation.  She states that she thinks that she is but is unable to tell me which medication.  Her medication list does not list any anticoagulation.  Past Medical History:  Diagnosis Date  . Acute deep vein thrombosis (DVT) of right peroneal vein ([place]) 11/30/2017   Overview:  2595: uncomplicated, xarelto  . AKI (acute kidney injury) ([place]) 01/01/2018  . Arthritis   . Atrial fibrillation ([place]) 02/08/2016  . Benign essential HTN 01/15/2016  . Bradycardia with 31 - 40 beats per minute 05/14/2015  . Bronchiectasis, non-tuberculous ([place])    a. Followed by Dr. Joya Gaskins.  . Cerebrovascular accident (CVA) due to thrombosis of right middle cerebral artery ([place]) 02/07/2016   Overview:  2017: R-MCA thrombosis with left non-dom hemiparesis, thalamic capsule ischemia 2018: recurrent  . Chest pain with low risk for cardiac etiology 11/18/2015  . Chronic kidney disease, stage III (moderate) 01/15/2016  . Chronic pain of left knee 12/07/2016  . Closed compression fracture of fourth lumbar vertebra ([place]) 01/19/2017   Overview:  2015  . Closed fracture of ramus of left pubis ([place]) 06/18/2017   Overview:  2018  . Colon polyps   . Complete  atrioventricular block ([place]) 01/15/2016   Overview:  2016: PM  . Complete heart block ([place])    a. s/p MDT dual chamber PPM   . COPD (chronic obstructive pulmonary disease) ([place])    past hx with pneumonia  . Coronary artery disease prior MI   . Diverticulosis   . DVT (deep venous thrombosis) (H[facility])    Resolved, no anticoagulation currently  . Dyspnea on exertion 10/12/2017   Overview:  2018  . Edema -lower extremity 06/22/2013  . Fatigue 08/19/2012  . GERD (gastroesophageal reflux disease)   . Hammer toe of left foot 06/07/2017  . Headache   . Heart block   . Hemiparesis of left nondominant side as late effect of cerebral infarction ([place]) 12/22/2016  . High cholesterol   . History of pulmonary embolism 08/30/2012   PE 2013  >>repeat CT Angio chest 12/2013>>>NO PE   . Hypertension   . IBS (irritable bowel syndrome)   . Left foot pain 06/07/2017  . Left leg pain 08/03/2016   Overview:  2017: related to weakness left leg from stroke  . Lumbar radiculopathy 07/27/2017  . 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 Malaise and fatigue 10/27/2017  . MI (myocardial infarction) ?? probably not 1975   a. 1975 Reported MI;  b. 2011 Cath: nonobs dzs;  c. 2013 Nonischemic Myoview;  d. 12/2012 Echo: EF 60-65%, Gr1  DD.  . Nephrolithiasis   . Orthostatic syncope 01/01/2018  . Osteoarthritis of right knee 12/12/2012  . Osteoporosis 08/01/2016  . Pain in limb 08/16/2013  . Pain of left femur 06/17/2017   Overview:  2018  . Palpitation    a. 2010 Event Monitor: PACs and PVCs  . Panlobular emphysema ([place]) 01/15/2016  . Paroxysmal supraventricular tachycardia ([place]) 01/15/2016  . Pneumonia   . PONV (postoperative nausea and vomiting)   . Post-dural puncture headache 09/01/2017  . Presence of cardiac pacemaker 01/16/2016   July 5,2016 Overview:  2016: complete AVB  . Presence of permanent cardiac pacemaker   . Pulmonary fibrosis ([place])   . S/P left knee arthroscopy 04/01/2017  . S/P TKR (total knee  replacement) using cement, right 12/07/2016  . Stroke ([place])   . Varicose veins of lower extremities with other complications 97/07/8920  . Vitamin D deficiency 08/01/2016  . Weakness generalized 11/25/2017  . Wears dentures    full  . Wide-complex tachycardia ([place])    a. Followed by Dr. Caryl Comes, "I think this is VT, but i am not sure QRSd 135 1:1 AV."    Patient Active Problem List   Diagnosis Date Noted  . Skin abnormality 04/20/2019  . Seizures ([place]) 09/06/2018  . Acute systolic heart failure ([place])   . Subarachnoid hemorrhage 09/04/2018  . Subdural hygroma   . Dyslipidemia   . AMS (altered mental status) 09/03/2018  . Chest pain 05/18/2018  . Chronic anticoagulation 02/11/2018  . Orthostatic hypotension 02/11/2018  . Chronic diastolic heart failure ([place]) 02/11/2018  . AKI (acute kidney injury) ([place]) 01/01/2018  . Syncope and collapse 01/01/2018  . History of DVT (deep vein thrombosis) 11/30/2017  . Weakness generalized 11/25/2017  . Malaise and fatigue 10/27/2017  . Dyspnea on exertion 10/12/2017  . Post-dural puncture headache 09/01/2017  . Lumbar radiculopathy 07/27/2017  . Closed fracture of ramus of left pubis ([place]) 06/18/2017  . Pain of left femur 06/17/2017  . Hammer toe of left foot 06/07/2017  . Left foot pain 06/07/2017  . S/P left knee arthroscopy 04/01/2017  . Closed compression fracture of fourth lumbar vertebra ([place]) 01/19/2017  . Hemiparesis of left nondominant side as late effect of cerebral infarction ([place]) 12/22/2016  . Chronic pain of left knee 12/07/2016  . S/P TKR (total knee replacement) using cement, right 12/07/2016  . Left leg pain 08/03/2016  . Osteoporosis 08/01/2016  . Pulmonary fibrosis ([place]) 08/01/2016  . Vitamin D deficiency 08/01/2016  . Paroxysmal atrial fibrillation ([place]) 02/08/2016  . Cerebrovascular accident (CVA) due to thrombosis of right middle cerebral artery ([place]) 02/07/2016  . Cardiac pacemaker in situ 01/16/2016  . Hypertensive  heart disease 01/15/2016  . Chronic kidney disease, stage III (moderate) 01/15/2016  . Complete atrioventricular block ([place]) 01/15/2016  . Panlobular emphysema ([place]) 01/15/2016  . Paroxysmal supraventricular tachycardia ([place]) 01/15/2016  . Chest pain with low risk for cardiac etiology 11/18/2015  . Bradycardia with 31 - 40 beats per minute 05/14/2015  . Heart block   . Pain in limb 08/16/2013  . Varicose veins of lower extremities with other complications 19/41/7408  . Edema -lower extremity 06/22/2013  . Osteoarthritis of right knee 12/12/2012  . History of pulmonary embolism 08/30/2012  . Fatigue 08/19/2012  . Wide-complex tachycardia ([place]) 05/30/2012  . Bronchiectasis, non-tuberculous ([place]) 01/06/2012  . 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 Diverticulosis   . High cholesterol   . Coronary artery disease prior MI     Past Surgical History:  Procedure Laterality Date  . APPENDECTOMY    . BLADDER SUSPENSION     tack  . CARDIAC CATHETERIZATION Right 05/14/2015   Procedure: Temporary Pacemaker;  Surgeon: Thompson Grayer, MD;  Location: [facility];  Service: Cardiovascular;  Laterality: Right;  . CHOLECYSTECTOMY    . COLON SURGERY     12inches colectomy s/p diverticulitis  . COLONOSCOPY W/ BIOPSIES AND POLYPECTOMY    . EP IMPLANTABLE DEVICE N/A 05/14/2015   Procedure: Pacemaker Implant;  Surgeon: Thompson Grayer, MD; [place] 1 (serial number [id number])     . IR FL GUIDED LOC OF NEEDLE/CATH TIP FOR SPINAL INJECTION LT  09/06/2017  . KNEE ARTHROSCOPY Left 03/29/2017   Procedure: ARTHROSCOPY LEFT KNEE WITH PARTIAL MEDIAL AND PARTIAL LATERAL MENISCECTOMY;  Surgeon: Vickey Huger, MD;  Location: [name];  Service: Orthopedics;  Laterality: Left;  Marland Kitchen MULTIPLE TOOTH EXTRACTIONS    . TONSILLECTOMY    . TOTAL ABDOMINAL HYSTERECTOMY    . TOTAL KNEE ARTHROPLASTY  12/12/2012   Procedure: TOTAL KNEE ARTHROPLASTY;  Surgeon: Alta Corning, MD;   Location: [facility];  Service: Orthopedics;  Laterality: Right;  right total knee arthroplasty     OB History   No obstetric history on file.     Family History  Problem Relation Age of Onset  . Heart disease Father   . Heart attack Mother   . Stomach cancer Brother   . Other Son        Triple Bypass    Social History   Tobacco Use  . Smoking status: Never Smoker  . Smokeless tobacco: Never Used  Substance Use Topics  . Alcohol use: No  . Drug use: No    Home Medications Prior to Admission medications   Medication Sig Start Date End Date Taking? Authorizing Provider  acetaminophen (TYLENOL) 500 MG tablet Take 1,000 mg by mouth every 6 (six) hours as needed (pain).    [provider]  aspirin EC 81 MG EC tablet Take 1 tablet (81 mg total) by mouth daily. 09/07/18   Meccariello, Bernita Raisin, DO  budesonide-formoterol (SYMBICORT) 160-4.5 MCG/ACT inhaler Inhale 2 puffs into the lungs 2 (two) times daily as needed (wheezing/shortness of breath).    [provider]  cholecalciferol (VITAMIN D) 1000 units tablet Take 1,000 Units by mouth daily.    [provider]  feeding supplement, ENSURE ENLIVE, (ENSURE ENLIVE) LIQD Take 237 mLs by mouth 2 (two) times daily between meals. 09/06/18   Meccariello, Bernita Raisin, DO  levETIRAcetam (KEPPRA) 500 MG tablet Take 1 tablet (500 mg total) by mouth 2 (two) times daily. 09/06/18   Meccariello, Bernita Raisin, DO  metoprolol tartrate (LOPRESSOR) 25 MG tablet Take 0.5 tablets (12.5 mg total) by mouth 2 (two) times daily. 09/06/18   Meccariello, Bernita Raisin, DO  pravastatin (PRAVACHOL) 20 MG tablet Take 1 tablet (20 mg total) by mouth daily at 6 PM. Patient not taking: Reported on 04/08/2018 03/18/18   Regalado, Jerald Kief A, MD  vitamin B-12 (CYANOCOBALAMIN) 1000 MCG tablet Take 1,000 mcg by mouth daily.    [provider]    Allergies    Crestor [rosuvastatin], Darvon, Meloxicam, Meperidine, Prednisone, Valsartan, Amiodarone,  Aspirin-acetaminophen-caffeine, Nitroglycerin, and Tramadol  Review of Systems   Review of Systems  All other systems reviewed and are negative.   Physical Exam Updated Vital Signs BP (!) 161/145 (BP Location: Right Arm)   Pulse 62   Temp (!) 97.3 F (36.3 C) (Oral)  Resp (!) 22   SpO2 96%   Physical Exam Vitals and nursing note reviewed.  Constitutional:      Appearance: Normal appearance.  HENT:     Head: Normocephalic.     Left Ear: External ear normal.     Nose: Nose normal.     Mouth/Throat:     Mouth: Mucous membranes are moist.     Pharynx: Oropharynx is clear.  Eyes:     Extraocular Movements: Extraocular movements intact.     Pupils: Pupils are equal, round, and reactive to light.  Neck:     Comments: No tenderness palpation over cervical spine Cardiovascular:     Rate and Rhythm: Normal rate and regular rhythm.  Pulmonary:     Effort: Pulmonary effort is normal.     Breath sounds: Normal breath sounds.  Abdominal:     General: Abdomen is flat.     Palpations: Abdomen is soft.  Musculoskeletal:        General: Normal range of motion.     Cervical back: Normal range of motion and neck supple.     Comments: Tenderness to palpation over left groin and sacrum  Skin:    General: Skin is warm and dry.     Capillary Refill: Capillary refill takes less than 2 seconds.  Neurological:     General: No focal deficit present.     Mental Status: She is alert and oriented to person, place, and time.  Psychiatric:        Mood and Affect: Mood normal.     ED Results / Procedures / Treatments   Labs (all labs ordered are listed, but only abnormal results are displayed) Labs Reviewed  BASIC METABOLIC PANEL - Abnormal; Notable for the following components:      Result Value   Creatinine, Ser 1.45 (*)    GFR calc non Af Amer 31 (*)    GFR calc Af Amer 36 (*)    All other components within normal limits  RESPIRATORY PANEL BY RT PCR (FLU A&B, COVID)  CBC     EKG None  Radiology DG Chest 1 View  Result Date: 12/09/2019 CLINICAL DATA:  Patient fell.  Pain. EXAM: CHEST  1 VIEW COMPARISON:  August 29, 2019 FINDINGS: Stable pacemaker. Stable cardiomegaly. The hila and mediastinum are normal. No pneumothorax. No nodules or masses. No focal infiltrates. Mild atelectasis in the lateral right lung base. IMPRESSION: No active disease. Electronically Signed   By: Dorise Bullion III M.D   On: 12/09/2019 13:12   DG Hip Unilat W or Wo Pelvis 2-3 Views Left  Result Date: 12/09/2019 CLINICAL DATA:  Pain after EXAM: DG HIP (WITH OR WITHOUT PELVIS) 2-3V LEFT COMPARISON:  None. FINDINGS: There is a fracture through the left superior pubic ramus. No other fractures noted. IMPRESSION: Fracture through the left superior pubic ramus. No other fractures are identified. Electronically Signed   By: Dorise Bullion III M.D   On: 12/09/2019 13:14    Procedures Procedures (including critical care time)  Medications Ordered in ED Medications  acetaminophen (TYLENOL) tablet 650 mg (650 mg Oral Given 12/09/19 1349)    ED Course  I have reviewed the triage vital signs and the nursing notes.  Pertinent labs & imaging results that were available during my care of the patient were reviewed by me and considered in my medical decision making (see chart for details).    MDM Rules/Calculators/A&P  Patient with left superior ramus fracture.  No other fracture noted.  Patient appears hemodynamically stable here.  She is ambulated with a walker.  She has a walker at home.  We have discussed need for follow-up and she is given referral to orthopedics.  Final Clinical Impression(s) / ED Diagnoses Final diagnoses:  Fall, initial encounter  Closed fracture of superior ramus of left pubis, initial encounter [facility])    Rx / [place] Orders ED Discharge Orders    None       Pattricia Boss, MD 12/09/19 1427

## 2019-12-09 NOTE — ED Notes (Signed)
Pt went to x-ray.

## 2019-12-09 NOTE — Discharge Instructions (Addendum)
Please use Tylenol, cold and warm therapy as needed for pain.  Use your walker for ambulation.  Call Dr. Diamantina [facility] office on Monday for follow-up.

## 2019-12-09 NOTE — ED Notes (Signed)
[phone]  emmily pellegrin son call for updates

## 2019-12-09 NOTE — ED Notes (Signed)
Confirmed home address with daughter. Daughter is aware that she will be receiving PTAR at the pts home.

## 2020-02-05 DIAGNOSIS — R079 Chest pain, unspecified: Secondary | ICD-10-CM

## 2020-02-05 DIAGNOSIS — M545 Low back pain: Secondary | ICD-10-CM

## 2020-02-05 DIAGNOSIS — I10 Essential (primary) hypertension: Secondary | ICD-10-CM

## 2020-02-05 DIAGNOSIS — R531 Weakness: Secondary | ICD-10-CM

## 2020-02-05 DIAGNOSIS — I2510 Atherosclerotic heart disease of native coronary artery without angina pectoris: Secondary | ICD-10-CM

## 2020-02-06 DIAGNOSIS — M545 Low back pain: Secondary | ICD-10-CM | POA: Diagnosis not present

## 2020-02-06 DIAGNOSIS — I2510 Atherosclerotic heart disease of native coronary artery without angina pectoris: Secondary | ICD-10-CM | POA: Diagnosis not present

## 2020-02-06 DIAGNOSIS — R531 Weakness: Secondary | ICD-10-CM | POA: Diagnosis not present

## 2020-02-06 DIAGNOSIS — R079 Chest pain, unspecified: Secondary | ICD-10-CM | POA: Diagnosis not present

## 2020-02-07 DIAGNOSIS — M545 Low back pain: Secondary | ICD-10-CM | POA: Diagnosis not present

## 2020-02-07 DIAGNOSIS — R079 Chest pain, unspecified: Secondary | ICD-10-CM | POA: Diagnosis not present

## 2020-02-07 DIAGNOSIS — R531 Weakness: Secondary | ICD-10-CM | POA: Diagnosis not present

## 2020-02-07 DIAGNOSIS — I2510 Atherosclerotic heart disease of native coronary artery without angina pectoris: Secondary | ICD-10-CM | POA: Diagnosis not present

## 2020-02-08 DIAGNOSIS — R079 Chest pain, unspecified: Secondary | ICD-10-CM | POA: Diagnosis not present

## 2020-02-08 DIAGNOSIS — M545 Low back pain: Secondary | ICD-10-CM | POA: Diagnosis not present

## 2020-02-08 DIAGNOSIS — I2510 Atherosclerotic heart disease of native coronary artery without angina pectoris: Secondary | ICD-10-CM | POA: Diagnosis not present

## 2020-02-08 DIAGNOSIS — R531 Weakness: Secondary | ICD-10-CM | POA: Diagnosis not present

## 2020-02-09 DIAGNOSIS — M545 Low back pain: Secondary | ICD-10-CM | POA: Diagnosis not present

## 2020-02-09 DIAGNOSIS — I2510 Atherosclerotic heart disease of native coronary artery without angina pectoris: Secondary | ICD-10-CM | POA: Diagnosis not present

## 2020-02-09 DIAGNOSIS — R079 Chest pain, unspecified: Secondary | ICD-10-CM | POA: Diagnosis not present

## 2020-02-09 DIAGNOSIS — R531 Weakness: Secondary | ICD-10-CM | POA: Diagnosis not present

## 2020-02-23 ENCOUNTER — Ambulatory Visit (INDEPENDENT_AMBULATORY_CARE_PROVIDER_SITE_OTHER): Payer: Medicare HMO | Admitting: *Deleted

## 2020-02-23 DIAGNOSIS — R001 Bradycardia, unspecified: Secondary | ICD-10-CM

## 2020-02-23 LAB — CUP PACEART REMOTE DEVICE CHECK
Battery Impedance: 347 Ohm
Battery Remaining Longevity: 92 mo
Battery Voltage: 2.78 V
Brady Statistic AP VP Percent: 54 %
Brady Statistic AP VS Percent: 0 %
Brady Statistic AS VP Percent: 46 %
Brady Statistic AS VS Percent: 0 %
Date Time Interrogation Session: 20210416153613
Implantable Lead Implant Date: 20160705
Implantable Lead Implant Date: 20160705
Implantable Lead Location: 753859
Implantable Lead Location: 753860
Implantable Lead Model: 5076
Implantable Lead Model: 5076
Implantable Pulse Generator Implant Date: 20160705
Lead Channel Impedance Value: 479 Ohm
Lead Channel Impedance Value: 532 Ohm
Lead Channel Pacing Threshold Amplitude: 0.5 V
Lead Channel Pacing Threshold Amplitude: 0.5 V
Lead Channel Pacing Threshold Pulse Width: 0.4 ms
Lead Channel Pacing Threshold Pulse Width: 0.4 ms
Lead Channel Setting Pacing Amplitude: 2 V
Lead Channel Setting Pacing Amplitude: 2.5 V
Lead Channel Setting Pacing Pulse Width: 0.4 ms
Lead Channel Setting Sensing Sensitivity: 4 mV

## 2020-02-23 NOTE — Progress Notes (Signed)
PPM Remote  

## 2020-05-27 ENCOUNTER — Other Ambulatory Visit: Payer: Self-pay

## 2020-05-27 ENCOUNTER — Encounter (HOSPITAL_COMMUNITY): Payer: Self-pay | Admitting: *Deleted

## 2020-05-27 ENCOUNTER — Emergency Department (HOSPITAL_COMMUNITY)
Admission: EM | Admit: 2020-05-27 | Discharge: 2020-05-28 | Disposition: A | Discharge status: Home / Self Care | Payer: Medicare HMO | Attending: Emergency Medicine | Admitting: Emergency Medicine

## 2020-05-27 DIAGNOSIS — Z5321 Procedure and treatment not carried out due to patient leaving prior to being seen by health care provider: Secondary | ICD-10-CM | POA: Insufficient documentation

## 2020-05-27 DIAGNOSIS — Y999 Unspecified external cause status: Secondary | ICD-10-CM | POA: Diagnosis not present

## 2020-05-27 DIAGNOSIS — Y939 Activity, unspecified: Secondary | ICD-10-CM | POA: Insufficient documentation

## 2020-05-27 DIAGNOSIS — R410 Disorientation, unspecified: Secondary | ICD-10-CM | POA: Insufficient documentation

## 2020-05-27 DIAGNOSIS — M545 Low back pain: Secondary | ICD-10-CM | POA: Diagnosis present

## 2020-05-27 DIAGNOSIS — W1839XA Other fall on same level, initial encounter: Secondary | ICD-10-CM | POA: Insufficient documentation

## 2020-05-27 DIAGNOSIS — R1030 Lower abdominal pain, unspecified: Secondary | ICD-10-CM | POA: Insufficient documentation

## 2020-05-27 DIAGNOSIS — Y929 Unspecified place or not applicable: Secondary | ICD-10-CM | POA: Diagnosis not present

## 2020-05-27 LAB — COMPREHENSIVE METABOLIC PANEL
ALT: 44 U/L (ref 0–44)
AST: 54 U/L — ABNORMAL HIGH (ref 15–41)
Albumin: 3.6 g/dL (ref 3.5–5.0)
Alkaline Phosphatase: 53 U/L (ref 38–126)
Anion gap: 8 (ref 5–15)
BUN: 20 mg/dL (ref 8–23)
CO2: 26 mmol/L (ref 22–32)
Calcium: 9.5 mg/dL (ref 8.9–10.3)
Chloride: 102 mmol/L (ref 98–111)
Creatinine, Ser: 1.05 mg/dL — ABNORMAL HIGH (ref 0.44–1.00)
GFR calc Af Amer: 53 mL/min — ABNORMAL LOW (ref >60)
GFR calc non Af Amer: 45 mL/min — ABNORMAL LOW (ref >60)
Glucose, Bld: 120 mg/dL — ABNORMAL HIGH (ref 70–99)
Potassium: 4.2 mmol/L (ref 3.5–5.1)
Sodium: 136 mmol/L (ref 135–145)
Total Bilirubin: 0.6 mg/dL (ref 0.3–1.2)
Total Protein: 6.6 g/dL (ref 6.5–8.1)

## 2020-05-27 LAB — CBC
HCT: 38.6 % (ref 36.0–46.0)
Hemoglobin: 12.1 g/dL (ref 12.0–15.0)
MCH: 30.1 pg (ref 26.0–34.0)
MCHC: 31.3 g/dL (ref 30.0–36.0)
MCV: 96 fL (ref 80.0–100.0)
Platelets: 140 10*3/uL — ABNORMAL LOW (ref 150–400)
RBC: 4.02 MIL/uL (ref 3.87–5.11)
RDW: 13.4 % (ref 11.5–15.5)
WBC: 7.1 10*3/uL (ref 4.0–10.5)
nRBC: 0 % (ref 0.0–0.2)

## 2020-05-27 MED ORDER — SODIUM CHLORIDE 0.9% FLUSH: 3.0000 mL | Freq: Once | INTRAVENOUS | Status: DC

## 2020-05-27 NOTE — ED Triage Notes (Signed)
Pt arrived by ptar from home. Pt had a fall 5 days ago and was seen at [facility] and dc home. Family reported patient having increase in confusion x 2 days with lower back pain and lower abd pain. Family reports mild pain with urination.

## 2020-05-27 NOTE — ED Notes (Signed)
Pt is going to primary care dr in the morning.

## 2020-06-11 ENCOUNTER — Ambulatory Visit (INDEPENDENT_AMBULATORY_CARE_PROVIDER_SITE_OTHER): Payer: Medicare HMO | Admitting: *Deleted

## 2020-06-11 DIAGNOSIS — I480 Paroxysmal atrial fibrillation: Secondary | ICD-10-CM

## 2020-06-11 LAB — CUP PACEART REMOTE DEVICE CHECK
Battery Impedance: 372 Ohm
Battery Remaining Longevity: 89 mo
Battery Voltage: 2.79 V
Brady Statistic AP VP Percent: 45 %
Brady Statistic AP VS Percent: 0 %
Brady Statistic AS VP Percent: 54 %
Brady Statistic AS VS Percent: 0 %
Date Time Interrogation Session: 20210803120159
Implantable Lead Implant Date: 20160705
Implantable Lead Implant Date: 20160705
Implantable Lead Location: 753859
Implantable Lead Location: 753860
Implantable Lead Model: 5076
Implantable Lead Model: 5076
Implantable Pulse Generator Implant Date: 20160705
Lead Channel Impedance Value: 472 Ohm
Lead Channel Impedance Value: 498 Ohm
Lead Channel Pacing Threshold Amplitude: 0.5 V
Lead Channel Pacing Threshold Amplitude: 0.5 V
Lead Channel Pacing Threshold Pulse Width: 0.4 ms
Lead Channel Pacing Threshold Pulse Width: 0.4 ms
Lead Channel Setting Pacing Amplitude: 2 V
Lead Channel Setting Pacing Amplitude: 2.5 V
Lead Channel Setting Pacing Pulse Width: 0.4 ms
Lead Channel Setting Sensing Sensitivity: 4 mV

## 2020-06-13 NOTE — Progress Notes (Signed)
Remote pacemaker transmission.   

## 2020-12-27 ENCOUNTER — Ambulatory Visit (INDEPENDENT_AMBULATORY_CARE_PROVIDER_SITE_OTHER): Payer: Medicare HMO

## 2020-12-27 DIAGNOSIS — I442 Atrioventricular block, complete: Secondary | ICD-10-CM | POA: Diagnosis not present

## 2020-12-28 LAB — CUP PACEART REMOTE DEVICE CHECK
Battery Impedance: 469 Ohm
Battery Remaining Longevity: 83 mo
Battery Voltage: 2.78 V
Brady Statistic AP VP Percent: 39 %
Brady Statistic AP VS Percent: 0 %
Brady Statistic AS VP Percent: 61 %
Brady Statistic AS VS Percent: 0 %
Date Time Interrogation Session: 20220218134658
Implantable Lead Implant Date: 20160705
Implantable Lead Implant Date: 20160705
Implantable Lead Location: 753859
Implantable Lead Location: 753860
Implantable Lead Model: 5076
Implantable Lead Model: 5076
Implantable Pulse Generator Implant Date: 20160705
Lead Channel Impedance Value: 487 Ohm
Lead Channel Impedance Value: 540 Ohm
Lead Channel Pacing Threshold Amplitude: 0.375 V
Lead Channel Pacing Threshold Amplitude: 0.5 V
Lead Channel Pacing Threshold Pulse Width: 0.4 ms
Lead Channel Pacing Threshold Pulse Width: 0.4 ms
Lead Channel Setting Pacing Amplitude: 2 V
Lead Channel Setting Pacing Amplitude: 2.5 V
Lead Channel Setting Pacing Pulse Width: 0.4 ms
Lead Channel Setting Sensing Sensitivity: 4 mV

## 2020-12-31 NOTE — Progress Notes (Signed)
Remote pacemaker transmission.   

## 2021-01-24 NOTE — Progress Notes (Deleted)
Cardiology Office Note Date:  01/24/2021  Patient ID:  [name] [name], [name] [name] 12-Jan-1924, MRN [id number] PCP:  Marisue Humble, FNP  Cardiologist:  Dr. Bettina Gavia Electrophysiologist: Dr. Caryl Comes    Chief Complaint:  *** overdue visit  History of Present Illness: [name] [name] is a 87 y.o. female with history of CHB w/PP, hx of palpitations associated with non-sustained atrial arrhythmias and NSVT, bronchiectasis, COPD, HTN, HLD, CVA 2013 hemorrhagic another CVA in march 2017 started on Eliquis though intolerant w/nausea, when last seen by Dr. Caryl Comes in Dec 2017 started on Xarelto.  He mentions "reviewed all of the electrograms from her device. There was atrial fibrillation of about 1 minute's duration in the spring. She is also on multiple episodes of atrial tachycardia. Given her very high CHADS-VASc score, I am inclined again towards anticoagulation"  DVT  Subsequently off a/c 2/2 SAH  She comes in today to be seen for Dr. Caryl Comes, last seen by him it seem March 2019, at that time struggling with UTI's, no new cardiac issues.  Subsequently had visits with Dr. Bettina Gavia and a couple hospital stays.  She had a site check visit for concerns of a lesion at her pacer with 04/20/2019, this was felt to be most c/w a skin cancer and urged prompt follow up dermatology.  + remotes  *** symptoms *** AF?AT burden *** labs, lipids *** ekg July 2021, ER visit with fall?  Not seen *** last echo 2019, 35-40%   Device information: MDT dual chamber PPM, implanted 05/14/15, Dr. Therisa Doyne dependent ***  Past Medical History:  Diagnosis Date  . Acute deep vein thrombosis (DVT) of right peroneal vein ([place]) 11/30/2017   Overview:  6283: uncomplicated, xarelto  . AKI (acute kidney injury) ([place]) 01/01/2018  . Arthritis   . Atrial fibrillation ([place]) 02/08/2016  . Benign essential HTN 01/15/2016  . Bradycardia with 31 - 40 beats per minute 05/14/2015  . Bronchiectasis, non-tuberculous ([place])    a. Followed by  Dr. Joya Gaskins.  . Cerebrovascular accident (CVA) due to thrombosis of right middle cerebral artery ([place]) 02/07/2016   Overview:  2017: R-MCA thrombosis with left non-dom hemiparesis, thalamic capsule ischemia 2018: recurrent  . Chest pain with low risk for cardiac etiology 11/18/2015  . Chronic kidney disease, stage III (moderate) 01/15/2016  . Chronic pain of left knee 12/07/2016  . Closed compression fracture of fourth lumbar vertebra ([place]) 01/19/2017   Overview:  2015  . Closed fracture of ramus of left pubis ([place]) 06/18/2017   Overview:  2018  . Colon polyps   . Complete atrioventricular block ([place]) 01/15/2016   Overview:  2016: PM  . Complete heart block ([place])    a. s/p MDT dual chamber PPM   . COPD (chronic obstructive pulmonary disease) ([place])    past hx with pneumonia  . Coronary artery disease prior MI   . Diverticulosis   . DVT (deep venous thrombosis) (HCC)    Resolved, no anticoagulation currently  . Dyspnea on exertion 10/12/2017   Overview:  2018  . Edema -lower extremity 06/22/2013  . Fatigue 08/19/2012  . GERD (gastroesophageal reflux disease)   . Hammer toe of left foot 06/07/2017  . Headache   . Heart block   . Hemiparesis of left nondominant side as late effect of cerebral infarction ([place]) 12/22/2016  . High cholesterol   . History of pulmonary embolism 08/30/2012   PE 2013  >>repeat CT Angio chest 12/2013>>>NO PE   . Hypertension   .  IBS (irritable bowel syndrome)   . Left foot pain 06/07/2017  . Left leg pain 08/03/2016   Overview:  2017: related to weakness left leg from stroke  . Lumbar radiculopathy 07/27/2017  . 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 Malaise and fatigue 10/27/2017  . MI (myocardial infarction) ?? probably not 1975   a. 1975 Reported MI;  b. 2011 Cath: nonobs dzs;  c. 2013 Nonischemic Myoview;  d. 12/2012 Echo: EF 60-65%, Gr1 DD.  . Nephrolithiasis   . Orthostatic syncope 01/01/2018  . Osteoarthritis of right knee 12/12/2012  .  Osteoporosis 08/01/2016  . Pain in limb 08/16/2013  . Pain of left femur 06/17/2017   Overview:  2018  . Palpitation    a. 2010 Event Monitor: PACs and PVCs  . Panlobular emphysema ([place]) 01/15/2016  . Paroxysmal supraventricular tachycardia ([place]) 01/15/2016  . Pneumonia   . PONV (postoperative nausea and vomiting)   . Post-dural puncture headache 09/01/2017  . Presence of cardiac pacemaker 01/16/2016   July 5,2016 Overview:  2016: complete AVB  . Presence of permanent cardiac pacemaker   . Pulmonary fibrosis ([place])   . S/P left knee arthroscopy 04/01/2017  . S/P TKR (total knee replacement) using cement, right 12/07/2016  . Stroke ([place])   . Varicose veins of lower extremities with other complications 16/11/958  . Vitamin D deficiency 08/01/2016  . Weakness generalized 11/25/2017  . Wears dentures    full  . Wide-complex tachycardia ([place])    a. Followed by Dr. Caryl Comes, "I think this is VT, but i am not sure QRSd 135 1:1 AV."    Past Surgical History:  Procedure Laterality Date  . APPENDECTOMY    . BLADDER SUSPENSION     tack  . CARDIAC CATHETERIZATION Right 05/14/2015   Procedure: Temporary Pacemaker;  Surgeon: Thompson Grayer, MD;  Location: [facility];  Service: Cardiovascular;  Laterality: Right;  . CHOLECYSTECTOMY    . COLON SURGERY     12inches colectomy s/p diverticulitis  . COLONOSCOPY W/ BIOPSIES AND POLYPECTOMY    . EP IMPLANTABLE DEVICE N/A 05/14/2015   Procedure: Pacemaker Implant;  Surgeon: Thompson Grayer, MD; [place] 1 (serial number [id number])     . IR FL GUIDED LOC OF NEEDLE/CATH TIP FOR SPINAL INJECTION LT  09/06/2017  . KNEE ARTHROSCOPY Left 03/29/2017   Procedure: ARTHROSCOPY LEFT KNEE WITH PARTIAL MEDIAL AND PARTIAL LATERAL MENISCECTOMY;  Surgeon: Vickey Huger, MD;  Location: [facility];  Service: Orthopedics;  Laterality: Left;  Marland Kitchen MULTIPLE TOOTH EXTRACTIONS    . TONSILLECTOMY    . TOTAL ABDOMINAL HYSTERECTOMY    . TOTAL KNEE ARTHROPLASTY  12/12/2012    Procedure: TOTAL KNEE ARTHROPLASTY;  Surgeon: Alta Corning, MD;  Location: [facility];  Service: Orthopedics;  Laterality: Right;  right total knee arthroplasty    Current Outpatient Medications  Medication Sig Dispense Refill  . acetaminophen (TYLENOL) 500 MG tablet Take 1,000 mg by mouth every 6 (six) hours as needed (pain).    Marland Kitchen aspirin EC 81 MG EC tablet Take 1 tablet (81 mg total) by mouth daily. 30 tablet 0  . budesonide-formoterol (SYMBICORT) 160-4.5 MCG/ACT inhaler Inhale 2 puffs into the lungs 2 (two) times daily as needed (wheezing/shortness of breath).    . cholecalciferol (VITAMIN D) 1000 units tablet Take 1,000 Units by mouth daily.    . feeding supplement, ENSURE ENLIVE, (ENSURE ENLIVE) LIQD Take 237 mLs by mouth 2 (two) times daily between meals. 5 Bottle  12  . levETIRAcetam (KEPPRA) 500 MG tablet Take 1 tablet (500 mg total) by mouth 2 (two) times daily. 60 tablet 0  . metoprolol tartrate (LOPRESSOR) 25 MG tablet Take 0.5 tablets (12.5 mg total) by mouth 2 (two) times daily. 30 tablet 0  . pravastatin (PRAVACHOL) 20 MG tablet Take 1 tablet (20 mg total) by mouth daily at 6 PM. (Patient not taking: Reported on 04/08/2018) 20 tablet 0  . vitamin B-12 (CYANOCOBALAMIN) 1000 MCG tablet Take 1,000 mcg by mouth daily.     No current facility-administered medications for this visit.    Allergies:   Crestor [rosuvastatin], Darvon, Meloxicam, Meperidine, Prednisone, Valsartan, Amiodarone, Aspirin-acetaminophen-caffeine, Nitroglycerin, and Tramadol   Social History:  The patient  reports that she has never smoked. She has never used smokeless tobacco. She reports that she does not drink alcohol and does not use drugs.   Family History:  The patient's family history includes Heart attack in her mother; Heart disease in her father; Other in her son; Stomach cancer in her brother.  ROS:  Please see the history of present illness.  All other systems are reviewed and otherwise negative.    PHYSICAL EXAM:  VS:  There were no vitals taken for this visit. BMI: There is no height or weight on file to calculate BMI. Well nourished, well developed, thin body habitus, in no acute distress  HEENT: normocephalic, atraumatic  Neck: no JVD, carotid bruits or masses Cardiac:  *** RRR; soft SM, no rubs, or gallops Lungs:  *** CTA b/l, no wheezing, rhonchi or rales  Abd: soft, nontender MS: no deformity, age appropriate atrophy Ext: *** no edema  Skin: warm and dry, no rash Neuro:  No gross deficits appreciated Psych: euthymic mood, full affect PPM site is stable, no tethering or discomfort   EKG:  Not done today 05/27/20 reviewed ***   PPM interrogation done today and reviewed by myself: ***  09/05/2018; TTE Study Conclusions  - Left ventricle: The cavity size was normal. Wall thickness was  normal. Systolic function was moderately reduced. The estimated  ejection fraction was in the range of 35% to 40%.   Impressions:  - Very poor acoustic windows, even with use of Definity Frequent  ectopy ALl make evaluation of LVEF difficult  Compared to echo images from July 2019, LVEF is worse.    Catheterization 2011 without obstructive diseas  Echo (07/29/15): EF 55-60%, no RWMA, G1DD, no pericardial effusion. Notably also aortic root was normal. 11 Nuclear (06/11/15): Myocardial perfusion is normal. The study is normal. This is a low risk study. Overall left ventricular systolic function was normal. Nuclear stress EF: 60%. The left ventricular ejection fraction is normal (55-65%). There is no prior study for comparison. 7/17 Myoview [facility] normal 3/17 echocardiogram [facility] normal EF 2+ TR Intracranial imaging demonstrated old infarcts with encephalomalacia, possible old subdural hematoma  Recent Labs: 05/27/2020: ALT 44; BUN 20; Creatinine, Ser 1.05; Hemoglobin 12.1; Platelets 140; Potassium 4.2; Sodium 136  No results found for requested labs  within last 8760 hours.   CrCl cannot be calculated (Patient's most recent lab result is older than the maximum 21 days allowed.).   Wt Readings from Last 3 Encounters:  09/04/18 137 lb 2 oz (62.2 kg)  05/19/18 135 lb 12.9 oz (61.6 kg)  04/08/18 137 lb (62.1 kg)     Other studies reviewed: Additional studies/records reviewed today include: summarized above  ASSESSMENT AND PLAN:  1. PPM     ***  Intact function     *** no changes made  2. ATach, ?AF     Off a/c seems 2019 2/2 SAH     *** % burden  3. HTN     ***  4. CM uncertain etiology)     BP and orthostatic issues      ***        Disposition: ***    Current medicines are reviewed at length with the patient today.  The patient did not have any concerns regarding medicines.  Venetia Night, PA-C 01/24/2021 3:50 PM     [address] [place]  [address] [phone] (office)  [phone] (fax)

## 2021-01-28 ENCOUNTER — Encounter: Payer: Medicare HMO | Admitting: Physician Assistant

## 2021-05-15 NOTE — Progress Notes (Signed)
Cardiology Office Note:    Date:  05/16/2021   ID:  [name] [name], DOB 1924-03-06, MRN [id number]  PCP:  Marisue Humble, FNP  Cardiologist:  Shirlee More, MD    Referring MD: Denzil Magnuson *    ASSESSMENT:    1. Complete atrioventricular block (HCC)   2. Cardiac pacemaker in situ   3. Paroxysmal atrial fibrillation (HCC)   4. Chronic systolic heart failure ([place])   5. Hypertensive heart disease with chronic diastolic congestive heart failure ([place])   6. High cholesterol   7. History of subarachnoid hemorrhage    PLAN:    In order of problems listed above:  Stable pacemaker function unfortunately she has developed a severe cardiomyopathy is pacemaker dependent I think with her overall frailty and debilitation she is a poor candidate for upgrade to CRT and would not advise Maintaining sinus rhythm currently not anticoagulated Decompensated heart failure is symptomatic she has edema or waiting her son to bring a list of meds it does not appear she takes a diuretic if that is the case I will put her on low-dose furosemide. Recheck in the office in a few weeks being careful not to induce hypotension and consider starting a low-dose of an ARB if blood pressure is maintained she has a history of previous syncope with orthostatic hypotension Not on lipid-lowering therapy and I would not reinstitute at this time I would avoid anticoagulation with her previous intracranial hemorrhage   Next appointment: 2 to 3 weeks   Medication Adjustments/Labs and Tests Ordered: Current medicines are reviewed at length with the patient today.  Concerns regarding medicines are outlined above.  No orders of the defined types were placed in this encounter.  No orders of the defined types were placed in this encounter.   Chief Complaint  Patient presents with   Establish Care    Again   Follow-up    Reestablish care paroxysmal atrial fibrillation heart failure and previous  orthostatic hypotension with syncope.  She is not anticoagulated because of previous subarachnoid hemorrhage.   Shortness of Breath    History of Present Illness:    [name] [name] is a 87 y.o. female with a hx of syncope with orthostatic hypotension chronic diastolic heart failure hypertensive heart disease she was previously anticoagulated because of venous thromboembolism paroxysmal atrial tachycardia and paroxysmal atrial fibrillation.  She has a history of DVT January 2019 heart block with a permanent pacemaker and a history of CAD with nonobstructive stenosis coronary angiography in 2011.  She was last seen by me 03/21/2018.  Compliance with diet, lifestyle and medications: Yes  She complains that she has quite weak and short of breath with any activities she has edema is sleeping with the head of her bed elevated. Although her chest x-ray did not show heart failure recently it appears obvious that she is fluid overloaded and decompensated heart failure. Her son is driving home to bring this back a current list of her medications. She has had a nonproductive cough but no fever or chills and no further chest pain  She was seen [facility] for chest pain 04/10/2021 in the ED 5-day duration intermittent symptoms.  Evaluation showed a normal high-sensitivity troponin and repeat without delta.  Hemoglobin was normal at 12.8 sodium 137 potassium 4.1 creatinine 1.21 GFR diminished stage III CKD 42 cc/min.  Chest x-ray showed mild left basilar atelectasis otherwise normal EKG was interpreted as an atrial sensed ventricularly paced rhythm.  Her  chest pain was felt to be nonspecific without acute coronary syndrome and was discharged from the hospital.  Her most recent remote pacemaker checks 12/27/2020 showed normal function lead parameters battery projected longevity 7 years she is ventricularly paced 100% of the time and atrial sensed 100% of the time.  In summary she is pacemaker  dependent.  Her device does not have OptiVol.  Her echocardiogram performed 09/05/2018 showed LV dysfunction with EF of 35 to 40%.  She is followed by neurology [facility] with a history of subarachnoid hemorrhage stroke 2016 and seizure disorder.  He also has restless leg syndrome. Past Medical History:  Diagnosis Date   Acute deep vein thrombosis (DVT) of right peroneal vein ([place]) 11/30/2017   Overview:  1275: uncomplicated, xarelto   AKI (acute kidney injury) ([place]) 01/01/2018   Arthritis    Atrial fibrillation ([place]) 02/08/2016   Benign essential HTN 01/15/2016   Bradycardia with 31 - 40 beats per minute 05/14/2015   Bronchiectasis, non-tuberculous ([place])    a. Followed by Dr. Joya Gaskins.   Cerebrovascular accident (CVA) due to thrombosis of right middle cerebral artery ([place]) 02/07/2016   Overview:  2017: R-MCA thrombosis with left non-dom hemiparesis, thalamic capsule ischemia 2018: recurrent   Chest pain with low risk for cardiac etiology 11/18/2015   Chronic kidney disease, stage III (moderate) (HCC) 01/15/2016   Chronic pain of left knee 12/07/2016   Closed compression fracture of fourth lumbar vertebra ([place]) 01/19/2017   Overview:  2015   Closed fracture of ramus of left pubis ([place]) 06/18/2017   Overview:  2018   Colon polyps    Complete atrioventricular block ([place]) 01/15/2016   Overview:  2016: PM   Complete heart block ([place])    a. s/p MDT dual chamber PPM    COPD (chronic obstructive pulmonary disease) ([place])    past hx with pneumonia   Coronary artery disease prior MI    Diverticulosis    DVT (deep venous thrombosis) (HCC)    Resolved, no anticoagulation currently   Dyspnea on exertion 10/12/2017   Overview:  2018   Edema -lower extremity 06/22/2013   Fatigue 08/19/2012   GERD (gastroesophageal reflux disease)    Hammer toe of left foot 06/07/2017   Headache    Heart block    Hemiparesis of left nondominant side as late effect of cerebral infarction ([place]) 12/22/2016   High  cholesterol    History of pulmonary embolism 08/30/2012   PE 2013  >>repeat CT Angio chest 12/2013>>>NO PE    Hypertension    IBS (irritable bowel syndrome)    Left foot pain 06/07/2017   Left leg pain 08/03/2016   Overview:  2017: related to weakness left leg from stroke   Lumbar radiculopathy 07/27/2017   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 probably not 1975   a. 1975 Reported MI;  b. 2011 Cath: nonobs dzs;  c. 2013 Nonischemic Myoview;  d. 12/2012 Echo: EF 60-65%, Gr1 DD.   Nephrolithiasis    Orthostatic syncope 01/01/2018   Osteoarthritis of right knee 12/12/2012   Osteoporosis 08/01/2016   Pain in limb 08/16/2013   Pain of left femur 06/17/2017   Overview:  2018   Palpitation    a. 2010 Event Monitor: PACs and PVCs   Panlobular emphysema ([place]) 01/15/2016   Paroxysmal supraventricular tachycardia (HCC) 01/15/2016   Pneumonia    PONV (postoperative nausea and vomiting)    Post-dural puncture headache  09/01/2017   Presence of cardiac pacemaker 01/16/2016   July 5,2016 Overview:  2016: complete AVB   Presence of permanent cardiac pacemaker    Pulmonary fibrosis ([place])    S/P left knee arthroscopy 04/01/2017   S/P TKR (total knee replacement) using cement, right 12/07/2016   Stroke ([place])    Varicose veins of lower extremities with other complications 01/14/487   Vitamin D deficiency 08/01/2016   Weakness generalized 11/25/2017   Wears dentures    full   Wide-complex tachycardia ([place])    a. Followed by Dr. Caryl Comes, "I think this is VT, but i am not sure QRSd 135 1:1 AV."    Past Surgical History:  Procedure Laterality Date   APPENDECTOMY     BLADDER SUSPENSION     tack   CARDIAC CATHETERIZATION Right 05/14/2015   Procedure: Temporary Pacemaker;  Surgeon: Thompson Grayer, MD;  Location: [facility];  Service: Cardiovascular;  Laterality: Right;   CHOLECYSTECTOMY     COLON SURGERY     12inches colectomy s/p  diverticulitis   COLONOSCOPY W/ BIOPSIES AND POLYPECTOMY     EP IMPLANTABLE DEVICE N/A 05/14/2015   Procedure: Pacemaker Implant;  Surgeon: Thompson Grayer, MD; [place] 1 (serial number [id number])      IR FL GUIDED LOC OF NEEDLE/CATH TIP FOR SPINAL INJECTION LT  09/06/2017   KNEE ARTHROSCOPY Left 03/29/2017   Procedure: ARTHROSCOPY LEFT KNEE WITH PARTIAL MEDIAL AND PARTIAL LATERAL MENISCECTOMY;  Surgeon: Vickey Huger, MD;  Location: [facility];  Service: Orthopedics;  Laterality: Left;   MULTIPLE TOOTH EXTRACTIONS     TONSILLECTOMY     TOTAL ABDOMINAL HYSTERECTOMY     TOTAL KNEE ARTHROPLASTY  12/12/2012   Procedure: TOTAL KNEE ARTHROPLASTY;  Surgeon: Alta Corning, MD;  Location: [facility];  Service: Orthopedics;  Laterality: Right;  right total knee arthroplasty    Current Medications: No outpatient medications have been marked as taking for the 05/16/21 encounter (Office Visit) with Richardo Priest, MD.     Allergies:   Crestor [rosuvastatin], Darvon, Meloxicam, Meperidine, Prednisone, Valsartan, Amiodarone, Aspirin-acetaminophen-caffeine, Nitroglycerin, and Tramadol   Social History   Socioeconomic History   Marital status: Widowed    Spouse name: Not on file   Number of children: 3   Years of education: Not on file   Highest education level: Not on file  Occupational History   Occupation: Retired    Comment: Musician  Tobacco Use   Smoking status: Never   Smokeless tobacco: Never  Vaping Use   Vaping Use: Never used  Substance and Sexual Activity   Alcohol use: No   Drug use: No   Sexual activity: Not on file  Other Topics Concern   Not on file  Social History Narrative   Not on file   Social Determinants of Health   Financial Resource Strain: Not on file  Food Insecurity: Not on file  Transportation Needs: Not on file  Physical Activity: Not on file  Stress: Not on file  Social Connections: Not on file     Family History: The patient's  family history includes Heart attack in her mother; Heart disease in her father; Other in her son; Stomach cancer in her brother. ROS:   Please see the history of present illness.    All other systems reviewed and are negative.  EKGs/Labs/Other Studies Reviewed:    The following studies were reviewed today:  EKG:  EKG ordered today and personally reviewed.  The ekg ordered today demonstrates dual-chamber paced rhythm 100% ventricularly paced  Recent Labs: 05/27/2020: ALT 44; BUN 20; Creatinine, Ser 1.05; Hemoglobin 12.1; Platelets 140; Potassium 4.2; Sodium 136  Recent Lipid Panel    Component Value Date/Time   CHOL 163 09/04/2018 0509   TRIG 77 09/04/2018 0509   HDL 42 09/04/2018 0509   CHOLHDL 3.9 09/04/2018 0509   VLDL 15 09/04/2018 0509   LDLCALC 106 (H) 09/04/2018 0509    Physical Exam:    VS:  BP 140/80 (BP Location: Left Arm, Patient Position: Sitting, Cuff Size: Small)   Pulse 72   Ht _0  (1.676 m)   Wt 134 lb (60.8 kg)   SpO2 95%   BMI 21.63 kg/m     Wt Readings from Last 3 Encounters:  05/16/21 134 lb (60.8 kg)  09/04/18 137 lb 2 oz (62.2 kg)  05/19/18 135 lb 12.9 oz (61.6 kg)     GEN: She looks very frail weak and breathless  HEENT: Normal NECK: Mild JVD; No carotid bruits LYMPHATICS: No lymphadenopathy CARDIAC: RRR, no murmurs, rubs, gallops RESPIRATORY: Diminished diffusely ABDOMEN: Soft, non-tender, non-distended MUSCULOSKELETAL: She has 2+ bilateral lower extremity pitting edema; No deformity  SKIN: Warm and dry NEUROLOGIC:  Alert and oriented x 3 PSYCHIATRIC:  Normal affect    Signed, Shirlee More, MD  05/16/2021 1:56 PM    [facility]

## 2021-05-16 ENCOUNTER — Encounter: Payer: Self-pay | Admitting: Cardiology

## 2021-05-16 ENCOUNTER — Ambulatory Visit: Payer: Medicare HMO | Admitting: Cardiology

## 2021-05-16 ENCOUNTER — Other Ambulatory Visit: Payer: Self-pay

## 2021-05-16 VITALS — BP 140/80 | HR 72 | Ht 66.0 in | Wt 134.0 lb

## 2021-05-16 DIAGNOSIS — I5022 Chronic systolic (congestive) heart failure: Secondary | ICD-10-CM | POA: Diagnosis not present

## 2021-05-16 DIAGNOSIS — I5032 Chronic diastolic (congestive) heart failure: Secondary | ICD-10-CM

## 2021-05-16 DIAGNOSIS — I442 Atrioventricular block, complete: Secondary | ICD-10-CM

## 2021-05-16 DIAGNOSIS — I480 Paroxysmal atrial fibrillation: Secondary | ICD-10-CM | POA: Diagnosis not present

## 2021-05-16 DIAGNOSIS — Z950 Presence of cardiac pacemaker: Secondary | ICD-10-CM

## 2021-05-16 DIAGNOSIS — I110 Hypertensive heart disease with heart failure: Secondary | ICD-10-CM

## 2021-05-16 DIAGNOSIS — Z8679 Personal history of other diseases of the circulatory system: Secondary | ICD-10-CM

## 2021-05-16 DIAGNOSIS — E7800 Pure hypercholesterolemia, unspecified: Secondary | ICD-10-CM

## 2021-05-16 MED ORDER — FUROSEMIDE 20 MG PO TABS: 20.0000 mg | ORAL_TABLET | Freq: Every day | ORAL | 3 refills | Status: DC

## 2021-05-16 NOTE — Patient Instructions (Signed)
Medication Instructions:  Your physician has recommended you make the following change in your medication:  START: Furosemide 20 mg take one tablet by mouth daily.  *If you need a refill on your cardiac medications before your next appointment, please call your pharmacy*   Lab Work: None If you have labs (blood work) drawn today and your tests are completely normal, you will receive your results only by: MyChart Message (if you have MyChart) OR A paper copy in the mail If you have any lab test that is abnormal or we need to change your treatment, we will call you to review the results.   Testing/Procedures: None   Follow-Up: At [facility], you and your health needs are our priority.  As part of our continuing mission to provide you with exceptional heart care, we have created designated Provider Care Teams.  These Care Teams include your primary Cardiologist (physician) and Advanced Practice Providers (APPs -  Physician Assistants and Nurse Practitioners) who all work together to provide you with the care you need, when you need it.  We recommend signing up for the patient portal called "MyChart".  Sign up information is provided on this After Visit Summary.  MyChart is used to connect with patients for Virtual Visits (Telemedicine).  Patients are able to view lab/test results, encounter notes, upcoming appointments, etc.  Non-urgent messages can be sent to your provider as well.   To learn more about what you can do with MyChart, go to [email or web address].    Your next appointment:   3 week(s)  The format for your next appointment:   In Person  Provider:   Norman Herrlich, MD   Other Instructions

## 2021-05-16 NOTE — Addendum Note (Signed)
Addended by: Delorse Limber I on: 05/16/2021 03:36 PM   Modules accepted: Orders

## 2021-05-16 NOTE — Addendum Note (Signed)
Addended by: Roosvelt Harps R on: 05/16/2021 03:09 PM   Modules accepted: Orders

## 2021-05-26 DIAGNOSIS — Z972 Presence of dental prosthetic device (complete) (partial): Secondary | ICD-10-CM | POA: Insufficient documentation

## 2021-05-26 DIAGNOSIS — R112 Nausea with vomiting, unspecified: Secondary | ICD-10-CM | POA: Insufficient documentation

## 2021-05-26 DIAGNOSIS — M1990 Unspecified osteoarthritis, unspecified site: Secondary | ICD-10-CM | POA: Insufficient documentation

## 2021-05-26 DIAGNOSIS — N200 Calculus of kidney: Secondary | ICD-10-CM | POA: Insufficient documentation

## 2021-05-26 DIAGNOSIS — J449 Chronic obstructive pulmonary disease, unspecified: Secondary | ICD-10-CM | POA: Insufficient documentation

## 2021-05-26 DIAGNOSIS — Z9889 Other specified postprocedural states: Secondary | ICD-10-CM | POA: Insufficient documentation

## 2021-05-26 DIAGNOSIS — R519 Headache, unspecified: Secondary | ICD-10-CM | POA: Insufficient documentation

## 2021-05-26 DIAGNOSIS — K635 Polyp of colon: Secondary | ICD-10-CM | POA: Insufficient documentation

## 2021-06-06 ENCOUNTER — Telehealth: Payer: Self-pay | Admitting: Cardiology

## 2021-06-06 ENCOUNTER — Encounter: Payer: Self-pay | Admitting: Cardiology

## 2021-06-06 ENCOUNTER — Other Ambulatory Visit: Payer: Self-pay

## 2021-06-06 ENCOUNTER — Ambulatory Visit: Payer: Medicare HMO | Admitting: Cardiology

## 2021-06-06 VITALS — BP 146/68 | HR 68 | Ht 66.0 in | Wt 132.2 lb

## 2021-06-06 DIAGNOSIS — Z950 Presence of cardiac pacemaker: Secondary | ICD-10-CM | POA: Diagnosis not present

## 2021-06-06 DIAGNOSIS — R0602 Shortness of breath: Secondary | ICD-10-CM

## 2021-06-06 DIAGNOSIS — I442 Atrioventricular block, complete: Secondary | ICD-10-CM

## 2021-06-06 DIAGNOSIS — I110 Hypertensive heart disease with heart failure: Secondary | ICD-10-CM

## 2021-06-06 DIAGNOSIS — I5032 Chronic diastolic (congestive) heart failure: Secondary | ICD-10-CM

## 2021-06-06 MED ORDER — FUROSEMIDE 20 MG PO TABS: ORAL_TABLET | ORAL | 3 refills | Status: AC

## 2021-06-06 NOTE — Patient Instructions (Signed)
Medication Instructions:  Your physician has recommended you make the following change in your medication: CHANGE: Furosemide 20 mg take one tablet by mouth every other day alternating with 0.5 tablet by mouth every other day.  *If you need a refill on your cardiac medications before your next appointment, please call your pharmacy*   Lab Work: Your physician recommends that you return for lab work in: TODAY BMP, ProBNP If you have labs (blood work) drawn today and your tests are completely normal, you will receive your results only by: MyChart Message (if you have MyChart) OR A paper copy in the mail If you have any lab test that is abnormal or we need to change your treatment, we will call you to review the results.   Testing/Procedures: None   Follow-Up: At [facility], you and your health needs are our priority.  As part of our continuing mission to provide you with exceptional heart care, we have created designated Provider Care Teams.  These Care Teams include your primary Cardiologist (physician) and Advanced Practice Providers (APPs -  Physician Assistants and Nurse Practitioners) who all work together to provide you with the care you need, when you need it.  We recommend signing up for the patient portal called "MyChart".  Sign up information is provided on this After Visit Summary.  MyChart is used to connect with patients for Virtual Visits (Telemedicine).  Patients are able to view lab/test results, encounter notes, upcoming appointments, etc.  Non-urgent messages can be sent to your provider as well.   To learn more about what you can do with MyChart, go to [email or web address].    Your next appointment:   3 month(s)  The format for your next appointment:   In Person  Provider:   Norman Herrlich, MD   Other Instructions

## 2021-06-06 NOTE — Progress Notes (Signed)
Cardiology Office Note:    Date:  06/06/2021   ID:  [name] [name], DOB 07-Jul-1924, MRN [id number]  PCP:  Marisue Humble, FNP  Cardiologist:  Shirlee More, MD    Referring MD: Denzil Magnuson *    ASSESSMENT:    1. Hypertensive heart disease with chronic diastolic congestive heart failure ([place])   2. Complete atrioventricular block (HCC)   3. Cardiac pacemaker in situ    PLAN:    In order of problems listed above:  She remains edematous short of breath not did go she had a chair to increase her diuretic every other day I think she reduce the dose until is ineffective.  Recheck renal function proBNP BP should respond to diuresis and she will continue antihypertensives amlodipine and metoprolol along with her statin Stable pacemaker function she is to be seen EP in the next few weeks we will continue to monitor device   Next appointment: 3 months with me   Medication Adjustments/Labs and Tests Ordered: Current medicines are reviewed at length with the patient today.  Concerns regarding medicines are outlined above.  No orders of the defined types were placed in this encounter.  No orders of the defined types were placed in this encounter.   Chief Complaint  Patient presents with   Follow-up   Congestive Heart Failure    History of Present Illness:    [name] [name] is a 87 y.o. female with a hx of complete heart block permanent pacemaker RV lead she is pacemaker dependent paroxysmal atrial fibrillation currently not anticoagulated chronic systolic heart failure ejection fraction 35 to 40% hypertension hyperlipidemia and history of subarachnoid hemorrhage.  She was last seen 05/16/2021 with decompensated heart failure.  Compliance with diet, lifestyle and medications: No, she is reduced her diuretic dose by 50%.   Her daughter is present they reduced the diuretic because she felt weak urinary frequency but also had some GI upset.  Unfortunately she still  has edema and shortness of breath Negotiated she take 20 mg every other day recheck renal function proBNP today unfortunately is not having orthopnea or PND. No chest pain palpitation or syncope Past Medical History:  Diagnosis Date   Acute deep vein thrombosis (DVT) of right peroneal vein ([place]) 11/30/2017   Overview:  2505: uncomplicated, xarelto   AKI (acute kidney injury) ([place]) 01/01/2018   Arthritis    Atrial fibrillation ([place]) 02/08/2016   Benign essential HTN 01/15/2016   Bradycardia with 31 - 40 beats per minute 05/14/2015   Bronchiectasis, non-tuberculous ([place])    a. Followed by Dr. Joya Gaskins.   Cerebrovascular accident (CVA) due to thrombosis of right middle cerebral artery ([place]) 02/07/2016   Overview:  2017: R-MCA thrombosis with left non-dom hemiparesis, thalamic capsule ischemia 2018: recurrent   Chest pain with low risk for cardiac etiology 11/18/2015   Chronic kidney disease, stage III (moderate) (HCC) 01/15/2016   Chronic pain of left knee 12/07/2016   Closed compression fracture of fourth lumbar vertebra ([place]) 01/19/2017   Overview:  2015   Closed fracture of ramus of left pubis ([place]) 06/18/2017   Overview:  2018   Colon polyps    Complete atrioventricular block ([place]) 01/15/2016   Overview:  2016: PM   Complete heart block ([place])    a. s/p MDT dual chamber PPM    COPD (chronic obstructive pulmonary disease) ([place])    past hx with pneumonia   Coronary artery disease prior MI    Diverticulosis    DVT (  deep venous thrombosis) (HCC)    Resolved, no anticoagulation currently   Dyspnea on exertion 10/12/2017   Overview:  2018   Edema -lower extremity 06/22/2013   Fatigue 08/19/2012   GERD (gastroesophageal reflux disease)    Hammer toe of left foot 06/07/2017   Headache    Heart block    Hemiparesis of left nondominant side as late effect of cerebral infarction ([place]) 12/22/2016   High cholesterol    History of pulmonary embolism 08/30/2012   PE 2013  >>repeat CT Angio chest 12/2013>>>NO PE     Hypertension    IBS (irritable bowel syndrome)    Left foot pain 06/07/2017   Left leg pain 08/03/2016   Overview:  2017: related to weakness left leg from stroke   Lumbar radiculopathy 07/27/2017   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 probably not 1975   a. 1975 Reported MI;  b. 2011 Cath: nonobs dzs;  c. 2013 Nonischemic Myoview;  d. 12/2012 Echo: EF 60-65%, Gr1 DD.   Nephrolithiasis    Orthostatic syncope 01/01/2018   Osteoarthritis of right knee 12/12/2012   Osteoporosis 08/01/2016   Pain in limb 08/16/2013   Pain of left femur 06/17/2017   Overview:  2018   Palpitation    a. 2010 Event Monitor: PACs and PVCs   Panlobular emphysema ([place]) 01/15/2016   Paroxysmal supraventricular tachycardia ([place]) 01/15/2016   Pneumonia    PONV (postoperative nausea and vomiting)    Post-dural puncture headache 09/01/2017   Presence of cardiac pacemaker 01/16/2016   July 5,2016 Overview:  2016: complete AVB   Presence of permanent cardiac pacemaker    Pulmonary fibrosis ([place])    S/P left knee arthroscopy 04/01/2017   S/P TKR (total knee replacement) using cement, right 12/07/2016   Stroke ([place])    Varicose veins of lower extremities with other complications 64/02/346   Vitamin D deficiency 08/01/2016   Weakness generalized 11/25/2017   Wears dentures    full   Wide-complex tachycardia ([place])    a. Followed by Dr. Caryl Comes, "I think this is VT, but i am not sure QRSd 135 1:1 AV."    Past Surgical History:  Procedure Laterality Date   APPENDECTOMY     BLADDER SUSPENSION     tack   CARDIAC CATHETERIZATION Right 05/14/2015   Procedure: Temporary Pacemaker;  Surgeon: Thompson Grayer, MD;  Location: [facility];  Service: Cardiovascular;  Laterality: Right;   CHOLECYSTECTOMY     COLON SURGERY     12inches colectomy s/p diverticulitis   COLONOSCOPY W/ BIOPSIES AND POLYPECTOMY     EP IMPLANTABLE DEVICE N/A 05/14/2015    Procedure: Pacemaker Implant;  Surgeon: Thompson Grayer, MD; [place] 1 (serial number [id number])      IR FL GUIDED LOC OF NEEDLE/CATH TIP FOR SPINAL INJECTION LT  09/06/2017   KNEE ARTHROSCOPY Left 03/29/2017   Procedure: ARTHROSCOPY LEFT KNEE WITH PARTIAL MEDIAL AND PARTIAL LATERAL MENISCECTOMY;  Surgeon: Vickey Huger, MD;  Location: [facility];  Service: Orthopedics;  Laterality: Left;   MULTIPLE TOOTH EXTRACTIONS     TONSILLECTOMY     TOTAL ABDOMINAL HYSTERECTOMY     TOTAL KNEE ARTHROPLASTY  12/12/2012   Procedure: TOTAL KNEE ARTHROPLASTY;  Surgeon: Alta Corning, MD;  Location: [facility];  Service: Orthopedics;  Laterality: Right;  right total knee arthroplasty    Current Medications: Current Meds  Medication Sig   acetaminophen (  TYLENOL) 500 MG tablet Take 1,000 mg by mouth at bedtime as needed for moderate pain (pain).   amLODipine (NORVASC) 5 MG tablet Take 1 tablet by mouth daily.   cholecalciferol (VITAMIN D) 1000 units tablet Take 1,000 Units by mouth daily.   furosemide (LASIX) 20 MG tablet Take 1 tablet (20 mg total) by mouth daily. (Patient taking differently: Take 20 mg by mouth daily. 0.5 TABLET ( 10 MG ) EVERY MORNING)   lamoTRIgine (LAMICTAL) 25 MG tablet Take 25 mg by mouth in the morning and at bedtime.   meclizine (ANTIVERT) 12.5 MG tablet Take 12.5 mg by mouth daily as needed for dizziness.   metoprolol tartrate (LOPRESSOR) 25 MG tablet Take 0.5 tablets (12.5 mg total) by mouth 2 (two) times daily.   nortriptyline (PAMELOR) 25 MG capsule Take 50 mg by mouth at bedtime.   pravastatin (PRAVACHOL) 20 MG tablet Take 1 tablet (20 mg total) by mouth daily at 6 PM.   tiZANidine (ZANAFLEX) 2 MG tablet Take 1-2 tablets by mouth every 8 (eight) hours as needed for muscle spasms.   vitamin B-12 (CYANOCOBALAMIN) 1000 MCG tablet Take 1,000 mcg by mouth daily.     Allergies:   Crestor [rosuvastatin], Darvon, Meloxicam, Meperidine, Prednisone, Valsartan, Amiodarone,  Aspirin-acetaminophen-caffeine, Nitroglycerin, and Tramadol   Social History   Socioeconomic History   Marital status: Widowed    Spouse name: Not on file   Number of children: 3   Years of education: Not on file   Highest education level: Not on file  Occupational History   Occupation: Retired    Comment: Musician  Tobacco Use   Smoking status: Never   Smokeless tobacco: Never  Vaping Use   Vaping Use: Never used  Substance and Sexual Activity   Alcohol use: No   Drug use: No   Sexual activity: Not on file  Other Topics Concern   Not on file  Social History Narrative   Not on file   Social Determinants of Health   Financial Resource Strain: Not on file  Food Insecurity: Not on file  Transportation Needs: Not on file  Physical Activity: Not on file  Stress: Not on file  Social Connections: Not on file     Family History: The patient's family history includes Heart attack in her mother; Heart disease in her father; Other in her son; Stomach cancer in her brother. ROS:   Please see the history of present illness.    All other systems reviewed and are negative.  EKGs/Labs/Other Studies Reviewed:    The following studies were reviewed today:    Recent Labs: No results found for requested labs within last 8760 hours.  Recent Lipid Panel    Component Value Date/Time   CHOL 163 09/04/2018 0509   TRIG 77 09/04/2018 0509   HDL 42 09/04/2018 0509   CHOLHDL 3.9 09/04/2018 0509   VLDL 15 09/04/2018 0509   LDLCALC 106 (H) 09/04/2018 0509    Physical Exam:    VS:  BP (!) 146/68 (BP Location: Left Arm, Patient Position: Sitting)   Pulse 68   Ht _0  (1.676 m)   Wt 132 lb 3.2 oz (60 kg)   SpO2 93%   BMI 21.34 kg/m     Wt Readings from Last 3 Encounters:  06/06/21 132 lb 3.2 oz (60 kg)  05/16/21 134 lb (60.8 kg)  09/04/18 137 lb 2 oz (62.2 kg)     GEN: She looks frail in wheelchair well nourished, well  developed in no acute distress HEENT:  Normal NECK: No JVD; No carotid bruits LYMPHATICS: No lymphadenopathy CARDIAC: RRR, no murmurs, rubs, gallops RESPIRATORY:  Clear to auscultation without rales, wheezing or rhonchi  ABDOMEN: Soft, non-tender, non-distended MUSCULOSKELETAL: Bilateral lower extremity 1+ pitting edema; No deformity  SKIN: Warm and dry NEUROLOGIC:  Alert and oriented x 3 PSYCHIATRIC:  Normal affect    Signed, Shirlee More, MD  06/06/2021 12:54 PM    [facility]

## 2021-06-06 NOTE — Telephone Encounter (Signed)
Spoke with the patients daughter just now and she was calling in regards to the patients furosemide. She wanted to make sure she understood the instructions so we went over them together at this time. She verbalizes understanding.

## 2021-06-06 NOTE — Telephone Encounter (Signed)
Drinda Butts is calling with questions about the pt medication

## 2021-06-06 NOTE — Addendum Note (Signed)
Addended by: Delorse Limber I on: 06/06/2021 01:08 PM   Modules accepted: Orders

## 2021-06-07 LAB — BASIC METABOLIC PANEL
BUN/Creatinine Ratio: 15 (ref 12–28)
BUN: 20 mg/dL (ref 10–36)
CO2: 23 mmol/L (ref 20–29)
Calcium: 9.8 mg/dL (ref 8.7–10.3)
Chloride: 101 mmol/L (ref 96–106)
Creatinine, Ser: 1.31 mg/dL — ABNORMAL HIGH (ref 0.57–1.00)
Glucose: 89 mg/dL (ref 65–99)
Potassium: 4.3 mmol/L (ref 3.5–5.2)
Sodium: 141 mmol/L (ref 134–144)
eGFR: 38 mL/min/{1.73_m2} — ABNORMAL LOW (ref >59)

## 2021-06-07 LAB — PRO B NATRIURETIC PEPTIDE: NT-Pro BNP: 309 pg/mL (ref 0–738)

## 2021-06-10 ENCOUNTER — Telehealth: Payer: Self-pay

## 2021-06-10 NOTE — Telephone Encounter (Signed)
-----   Message from Baldo Daub, MD sent at 06/08/2021 12:01 PM EDT ----- Stable results no changes I had increased her diuretic at the office visit

## 2021-06-10 NOTE — Progress Notes (Signed)
Electrophysiology Office Note Date: 06/11/2021  ID:  [name], [name] 29-Nov-1923, MRN [id number]  PCP: Marisue Humble, FNP Primary Cardiologist: Shirlee More, MD Electrophysiologist: Virl Axe, MD   CC: Pacemaker follow-up  [name] [name] is a 87 y.o. female seen today for Virl Axe, MD for routine electrophysiology followup.  Since last being seen in our clinic the patient reports doing well overall. Has been taken off [place] for frequent falls.  she denies chest pain, palpitations, dyspnea, PND, orthopnea, nausea, vomiting, dizziness, syncope, edema, weight gain, or early satiety.  Device History: Medtronic Dual Chamber PPM implanted 2016 for CHB  Past Medical History:  Diagnosis Date   Acute deep vein thrombosis (DVT) of right peroneal vein ([place]) 11/30/2017   Overview:  7482: uncomplicated, xarelto   AKI (acute kidney injury) ([place]) 01/01/2018   Arthritis    Atrial fibrillation ([place]) 02/08/2016   Benign essential HTN 01/15/2016   Bradycardia with 31 - 40 beats per minute 05/14/2015   Bronchiectasis, non-tuberculous ([place])    a. Followed by Dr. Joya Gaskins.   Cerebrovascular accident (CVA) due to thrombosis of right middle cerebral artery ([place]) 02/07/2016   Overview:  2017: R-MCA thrombosis with left non-dom hemiparesis, thalamic capsule ischemia 2018: recurrent   Chest pain with low risk for cardiac etiology 11/18/2015   Chronic kidney disease, stage III (moderate) (HCC) 01/15/2016   Chronic pain of left knee 12/07/2016   Closed compression fracture of fourth lumbar vertebra ([place]) 01/19/2017   Overview:  2015   Closed fracture of ramus of left pubis ([place]) 06/18/2017   Overview:  2018   Colon polyps    Complete atrioventricular block ([place]) 01/15/2016   Overview:  2016: PM   Complete heart block ([place])    a. s/p MDT dual chamber PPM    COPD (chronic obstructive pulmonary disease) ([place])    past hx with pneumonia   Coronary artery disease prior MI    Diverticulosis    DVT (deep  venous thrombosis) (HCC)    Resolved, no anticoagulation currently   Dyspnea on exertion 10/12/2017   Overview:  2018   Edema -lower extremity 06/22/2013   Fatigue 08/19/2012   GERD (gastroesophageal reflux disease)    Hammer toe of left foot 06/07/2017   Headache    Heart block    Hemiparesis of left nondominant side as late effect of cerebral infarction ([place]) 12/22/2016   High cholesterol    History of pulmonary embolism 08/30/2012   PE 2013  >>repeat CT Angio chest 12/2013>>>NO PE    Hypertension    IBS (irritable bowel syndrome)    Left foot pain 06/07/2017   Left leg pain 08/03/2016   Overview:  2017: related to weakness left leg from stroke   Lumbar radiculopathy 07/27/2017   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 probably not 1975   a. 1975 Reported MI;  b. 2011 Cath: nonobs dzs;  c. 2013 Nonischemic Myoview;  d. 12/2012 Echo: EF 60-65%, Gr1 DD.   Nephrolithiasis    Orthostatic syncope 01/01/2018   Osteoarthritis of right knee 12/12/2012   Osteoporosis 08/01/2016   Pain in limb 08/16/2013   Pain of left femur 06/17/2017   Overview:  2018   Palpitation    a. 2010 Event Monitor: PACs and PVCs   Panlobular emphysema ([place]) 01/15/2016   Paroxysmal supraventricular tachycardia ([place]) 01/15/2016   Pneumonia    PONV (postoperative nausea and vomiting)  Post-dural puncture headache 09/01/2017   Presence of cardiac pacemaker 01/16/2016   July 5,2016 Overview:  2016: complete AVB   Presence of permanent cardiac pacemaker    Pulmonary fibrosis ([place])    S/P left knee arthroscopy 04/01/2017   S/P TKR (total knee replacement) using cement, right 12/07/2016   Stroke ([place])    Varicose veins of lower extremities with other complications 25/06/5276   Vitamin D deficiency 08/01/2016   Weakness generalized 11/25/2017   Wears dentures    full   Wide-complex tachycardia ([place])    a. Followed by Dr. Caryl Comes, "I think this is VT,  but i am not sure QRSd 135 1:1 AV."   Past Surgical History:  Procedure Laterality Date   APPENDECTOMY     BLADDER SUSPENSION     tack   CARDIAC CATHETERIZATION Right 05/14/2015   Procedure: Temporary Pacemaker;  Surgeon: Thompson Grayer, MD;  Location: [facility];  Service: Cardiovascular;  Laterality: Right;   CHOLECYSTECTOMY     COLON SURGERY     12inches colectomy s/p diverticulitis   COLONOSCOPY W/ BIOPSIES AND POLYPECTOMY     EP IMPLANTABLE DEVICE N/A 05/14/2015   Procedure: Pacemaker Implant;  Surgeon: Thompson Grayer, MD; [place] 1 (serial number [id number])      IR FL GUIDED LOC OF NEEDLE/CATH TIP FOR SPINAL INJECTION LT  09/06/2017   KNEE ARTHROSCOPY Left 03/29/2017   Procedure: ARTHROSCOPY LEFT KNEE WITH PARTIAL MEDIAL AND PARTIAL LATERAL MENISCECTOMY;  Surgeon: Vickey Huger, MD;  Location: [facility];  Service: Orthopedics;  Laterality: Left;   MULTIPLE TOOTH EXTRACTIONS     TONSILLECTOMY     TOTAL ABDOMINAL HYSTERECTOMY     TOTAL KNEE ARTHROPLASTY  12/12/2012   Procedure: TOTAL KNEE ARTHROPLASTY;  Surgeon: Alta Corning, MD;  Location: [facility];  Service: Orthopedics;  Laterality: Right;  right total knee arthroplasty    Current Outpatient Medications  Medication Sig Dispense Refill   acetaminophen (TYLENOL) 500 MG tablet Take 1,000 mg by mouth at bedtime as needed for moderate pain (pain).     amLODipine (NORVASC) 5 MG tablet Take 1 tablet by mouth daily.     cholecalciferol (VITAMIN D) 1000 units tablet Take 1,000 Units by mouth daily.     furosemide (LASIX) 20 MG tablet Take one tablet by mouth every other day alternating with 0.5 tablet by mouth every other day. 90 tablet 3   lamoTRIgine (LAMICTAL) 25 MG tablet Take 25 mg by mouth in the morning and at bedtime.     meclizine (ANTIVERT) 12.5 MG tablet Take 12.5 mg by mouth daily as needed for dizziness.     metoprolol tartrate (LOPRESSOR) 25 MG tablet Take 0.5 tablets (12.5 mg total) by mouth 2 (two) times  daily. 30 tablet 0   nortriptyline (PAMELOR) 25 MG capsule Take 50 mg by mouth at bedtime.     pravastatin (PRAVACHOL) 20 MG tablet Take 1 tablet (20 mg total) by mouth daily at 6 PM. 20 tablet 0   tiZANidine (ZANAFLEX) 2 MG tablet Take 1-2 tablets by mouth every 8 (eight) hours as needed for muscle spasms.     vitamin B-12 (CYANOCOBALAMIN) 1000 MCG tablet Take 1,000 mcg by mouth daily.     No current facility-administered medications for this visit.    Allergies:   Crestor [rosuvastatin], Darvon, Meloxicam, Meperidine, Prednisone, Valsartan, Amiodarone, Aspirin-acetaminophen-caffeine, Nitroglycerin, and Tramadol   Social History: Social History   Socioeconomic History   Marital status: Widowed  Spouse name: Not on file   Number of children: 3   Years of education: Not on file   Highest education level: Not on file  Occupational History   Occupation: Retired    Comment: Musician  Tobacco Use   Smoking status: Never   Smokeless tobacco: Never  Vaping Use   Vaping Use: Never used  Substance and Sexual Activity   Alcohol use: No   Drug use: No   Sexual activity: Not on file  Other Topics Concern   Not on file  Social History Narrative   Not on file   Social Determinants of Health   Financial Resource Strain: Not on file  Food Insecurity: Not on file  Transportation Needs: Not on file  Physical Activity: Not on file  Stress: Not on file  Social Connections: Not on file  Intimate Partner Violence: Not on file    Family History: Family History  Problem Relation Age of Onset   Heart disease Father    Heart attack Mother    Stomach cancer Brother    Other Son        Triple [place]pass     Review of Systems: All other systems reviewed and are otherwise negative except as noted above.  Physical Exam: Vitals:   06/11/21 1149  BP: 128/62  Pulse: 65  SpO2: 91%  Weight: 131 lb 9.6 oz (59.7 kg)  Height: _0  (1.702 m)     GEN- The patient is well  appearing, alert and oriented x 3 today.   HEENT: normocephalic, atraumatic; sclera clear, conjunctiva pink; hearing intact; oropharynx clear; neck supple  Lungs- Clear to ausculation bilaterally, normal work of breathing.  No wheezes, rales, rhonchi Heart- Regular rate and rhythm, no murmurs, rubs or gallops  GI- soft, non-tender, non-distended, bowel sounds present  Extremities- no clubbing or cyanosis. No edema MS- no significant deformity or atrophy Skin- warm and dry, no rash or lesion; PPM pocket well healed Psych- euthymic mood, full affect Neuro- strength and sensation are intact  PPM Interrogation- reviewed in detail today,  See PACEART report  EKG:  EKG is not ordered today. Personal review of ekg ordered 05/16/21 shows AS-VP at 72 bpm  Recent Labs: 06/06/2021: BUN 20; Creatinine, Ser 1.31; NT-Pro BNP 309; Potassium 4.3; Sodium 141   Wt Readings from Last 3 Encounters:  06/11/21 131 lb 9.6 oz (59.7 kg)  06/06/21 132 lb 3.2 oz (60 kg)  05/16/21 134 lb (60.8 kg)     Other studies Reviewed: Additional studies/ records that were reviewed today include: Previous EP office notes, Previous remote checks, Most recent labwork.   Assessment and Plan:  1. CHB s/p Medtronic PPM  Normal PPM function See Pace Art report No changes today  2. HTN Stable on current regimen  3. Atrial fibrillation CHA2DS2/VASc of at least 8. Previously taken off of Xarelto [place] <1% over long follow up period. Follow closely to help discussion with whether or not to resume [place].   4. H/o Peroneal DVT  Previously taken off of Xarelto due to fall.  Current medicines are reviewed at length with the patient today.   The patient does not have concerns regarding her medicines.  The following changes were made today:  none  Labs/ tests ordered today include:  Labs from 06/04/2021 reviewed personally. No orders of the defined types were placed in this encounter.  Disposition:   Follow up with Dr.  Caryl Comes in 12 Months   Signed, Shirley Friar, [place]  06/11/2021 11:57 AM  [facility] [address] [place] [address] [phone] (office) [phone] (fax)

## 2021-06-10 NOTE — Telephone Encounter (Signed)
Spoke with patient regarding results and recommendation.  Patient verbalizes understanding and is agreeable to plan of care. Advised patient to call back with any issues or concerns.  

## 2021-06-11 ENCOUNTER — Ambulatory Visit: Payer: Medicare HMO | Admitting: Student

## 2021-06-11 ENCOUNTER — Other Ambulatory Visit: Payer: Self-pay

## 2021-06-11 ENCOUNTER — Encounter: Payer: Self-pay | Admitting: Student

## 2021-06-11 VITALS — BP 128/62 | HR 65 | Ht 67.0 in | Wt 131.6 lb

## 2021-06-11 DIAGNOSIS — Z950 Presence of cardiac pacemaker: Secondary | ICD-10-CM | POA: Diagnosis not present

## 2021-06-11 DIAGNOSIS — I480 Paroxysmal atrial fibrillation: Secondary | ICD-10-CM

## 2021-06-11 DIAGNOSIS — I442 Atrioventricular block, complete: Secondary | ICD-10-CM

## 2021-06-11 DIAGNOSIS — I110 Hypertensive heart disease with heart failure: Secondary | ICD-10-CM

## 2021-06-11 DIAGNOSIS — I5032 Chronic diastolic (congestive) heart failure: Secondary | ICD-10-CM

## 2021-06-11 LAB — CUP PACEART INCLINIC DEVICE CHECK
Battery Impedance: 568 Ohm
Battery Remaining Longevity: 77 mo
Battery Voltage: 2.78 V
Brady Statistic AP VP Percent: 34 %
Brady Statistic AP VS Percent: 0 %
Brady Statistic AS VP Percent: 66 %
Brady Statistic AS VS Percent: 0 %
Date Time Interrogation Session: 20220803134957
Implantable Lead Implant Date: 20160705
Implantable Lead Implant Date: 20160705
Implantable Lead Location: 753859
Implantable Lead Location: 753860
Implantable Lead Model: 5076
Implantable Lead Model: 5076
Implantable Pulse Generator Implant Date: 20160705
Lead Channel Impedance Value: 487 Ohm
Lead Channel Impedance Value: 559 Ohm
Lead Channel Pacing Threshold Amplitude: 0.5 V
Lead Channel Pacing Threshold Amplitude: 0.5 V
Lead Channel Pacing Threshold Amplitude: 0.5 V
Lead Channel Pacing Threshold Amplitude: 1 V
Lead Channel Pacing Threshold Pulse Width: 0.4 ms
Lead Channel Pacing Threshold Pulse Width: 0.4 ms
Lead Channel Pacing Threshold Pulse Width: 0.4 ms
Lead Channel Pacing Threshold Pulse Width: 0.4 ms
Lead Channel Sensing Intrinsic Amplitude: 2 mV
Lead Channel Setting Pacing Amplitude: 2 V
Lead Channel Setting Pacing Amplitude: 2.5 V
Lead Channel Setting Pacing Pulse Width: 0.4 ms
Lead Channel Setting Sensing Sensitivity: 4 mV

## 2021-06-11 NOTE — Patient Instructions (Signed)
Medication Instructions:  Your physician recommends that you continue on your current medications as directed. Please refer to the Current Medication list given to you today.  *If you need a refill on your cardiac medications before your next appointment, please call your pharmacy*   Lab Work: None If you have labs (blood work) drawn today and your tests are completely normal, you will receive your results only by: MyChart Message (if you have MyChart) OR A paper copy in the mail If you have any lab test that is abnormal or we need to change your treatment, we will call you to review the results.   Follow-Up: At [facility], you and your health needs are our priority.  As part of our continuing mission to provide you with exceptional heart care, we have created designated Provider Care Teams.  These Care Teams include your primary Cardiologist (physician) and Advanced Practice Providers (APPs -  Physician Assistants and Nurse Practitioners) who all work together to provide you with the care you need, when you need it.   Your next appointment:   1 year(s)  The format for your next appointment:   In Person  Provider:   You may see Steven Klein, MD or one of the following Advanced Practice Providers on your designated Care Team:   Renee Ursuy, PA-C Michael "Andy" Tillery, PA-C    

## 2021-06-16 ENCOUNTER — Ambulatory Visit (INDEPENDENT_AMBULATORY_CARE_PROVIDER_SITE_OTHER): Payer: Medicare HMO

## 2021-06-16 DIAGNOSIS — I442 Atrioventricular block, complete: Secondary | ICD-10-CM

## 2021-06-17 LAB — CUP PACEART REMOTE DEVICE CHECK
Battery Impedance: 568 Ohm
Battery Remaining Longevity: 79 mo
Battery Voltage: 2.78 V
Brady Statistic AP VP Percent: 32 %
Brady Statistic AP VS Percent: 0 %
Brady Statistic AS VP Percent: 68 %
Brady Statistic AS VS Percent: 0 %
Date Time Interrogation Session: 20220807132321
Implantable Lead Implant Date: 20160705
Implantable Lead Implant Date: 20160705
Implantable Lead Location: 753859
Implantable Lead Location: 753860
Implantable Lead Model: 5076
Implantable Lead Model: 5076
Implantable Pulse Generator Implant Date: 20160705
Lead Channel Impedance Value: 487 Ohm
Lead Channel Impedance Value: 573 Ohm
Lead Channel Pacing Threshold Amplitude: 0.5 V
Lead Channel Pacing Threshold Amplitude: 0.5 V
Lead Channel Pacing Threshold Pulse Width: 0.4 ms
Lead Channel Pacing Threshold Pulse Width: 0.4 ms
Lead Channel Setting Pacing Amplitude: 2 V
Lead Channel Setting Pacing Amplitude: 2.5 V
Lead Channel Setting Pacing Pulse Width: 0.4 ms
Lead Channel Setting Sensing Sensitivity: 4 mV

## 2021-07-08 NOTE — Progress Notes (Signed)
Remote pacemaker transmission.   

## 2021-09-09 ENCOUNTER — Encounter: Payer: Self-pay | Admitting: Cardiology

## 2021-09-09 ENCOUNTER — Ambulatory Visit: Payer: Medicare HMO | Admitting: Cardiology

## 2021-09-09 ENCOUNTER — Other Ambulatory Visit: Payer: Self-pay

## 2021-09-09 VITALS — BP 130/70 | HR 72 | Ht 67.0 in | Wt 132.0 lb

## 2021-09-09 DIAGNOSIS — Z950 Presence of cardiac pacemaker: Secondary | ICD-10-CM

## 2021-09-09 DIAGNOSIS — I480 Paroxysmal atrial fibrillation: Secondary | ICD-10-CM

## 2021-09-09 DIAGNOSIS — I110 Hypertensive heart disease with heart failure: Secondary | ICD-10-CM | POA: Diagnosis not present

## 2021-09-09 DIAGNOSIS — E7800 Pure hypercholesterolemia, unspecified: Secondary | ICD-10-CM

## 2021-09-09 DIAGNOSIS — I442 Atrioventricular block, complete: Secondary | ICD-10-CM

## 2021-09-09 DIAGNOSIS — I5032 Chronic diastolic (congestive) heart failure: Secondary | ICD-10-CM

## 2021-09-09 MED ORDER — ASPIRIN EC 81 MG PO TBEC: 81.0000 mg | DELAYED_RELEASE_TABLET | Freq: Every day | ORAL | 3 refills | Status: AC

## 2021-09-09 NOTE — Progress Notes (Signed)
Cardiology Office Note:    Date:  09/09/2021   ID:  [name] [name], DOB 14-Sep-1924, MRN [id number]  PCP:  Marisue Humble, FNP  Cardiologist:  Shirlee More, MD    Referring MD: Denzil Magnuson *    ASSESSMENT:    1. Hypertensive heart disease with chronic diastolic congestive heart failure ([place])   2. Complete atrioventricular block (HCC)   3. Cardiac pacemaker in situ   4. Paroxysmal atrial fibrillation (HCC)   5. High cholesterol    PLAN:    In order of problems listed above:  Overall she is doing well her heart failure is nicely compensated her dietary sodium and medications are supervised New York Heart Association class I continue her low-dose loop diuretic recheck renal function potassium today Stable bradycardia issues 100% ventricularly paced No recurrent atrial fibrillation continue beta-blocker Blood pressure target continue current treatment amlodipine metoprolol and loop diuretic Stable hyperlipidemia she is on pravastatin with history of stroke recheck liver function LDL cholesterol   Next appointment: 6 months   Medication Adjustments/Labs and Tests Ordered: Current medicines are reviewed at length with the patient today.  Concerns regarding medicines are outlined above.  No orders of the defined types were placed in this encounter.  No orders of the defined types were placed in this encounter.   Chief Complaint  Patient presents with   Follow-up   Congestive Heart Failure   Atrial Fibrillation    History of Present Illness:    [name] [name] is a 87 y.o. female with a hx of complete heart block with permanent pacemaker not anticoagulated heart failure EF  hypertension with heart failure hyperlipidemia and a history of subarachnoid hemorrhage.  She was last seen 06/06/2021.  Compliance with diet, lifestyle and medications: Yes  Her daughter is present and supervises medications and participates in evaluation decision making Her mother  is doing better and she is returned to work part-time They have had no problems with edema shortness of breath palpitations syncope falls or chest pain. Overall she is pleased with the quality of her life  Her last device download 06/15/2021 shows normal lead and device parameters and she is 100% ventricularly paced.  No atrial fibrillation Past Medical History:  Diagnosis Date   Acute deep vein thrombosis (DVT) of right peroneal vein ([name] [name]) 11/30/2017   Overview:  5170: uncomplicated, xarelto   AKI (acute kidney injury) ([place]) 01/01/2018   Arthritis    Atrial fibrillation ([place]) 02/08/2016   Benign essential HTN 01/15/2016   Bradycardia with 31 - 40 beats per minute 05/14/2015   Bronchiectasis, non-tuberculous ([place])    a. Followed by Dr. Joya Gaskins.   Cerebrovascular accident (CVA) due to thrombosis of right middle cerebral artery ([name] [name]) 02/07/2016   Overview:  2017: R-MCA thrombosis with left non-dom hemiparesis, thalamic capsule ischemia 2018: recurrent   Chest pain with low risk for cardiac etiology 11/18/2015   Chronic kidney disease, stage III (moderate) (HCC) 01/15/2016   Chronic pain of left knee 12/07/2016   Closed compression fracture of fourth lumbar vertebra ([name] [name]) 01/19/2017   Overview:  2015   Closed fracture of ramus of left pubis ([name] [name]) 06/18/2017   Overview:  2018   Colon polyps    Complete atrioventricular block ([name] [name]) 01/15/2016   Overview:  2016: PM   Complete heart block ([name] [name])    a. s/p MDT dual chamber PPM    COPD (chronic obstructive pulmonary disease) ([place])    past hx with pneumonia   Coronary artery disease prior  MI    Diverticulosis    DVT (deep venous thrombosis) (HCC)    Resolved, no anticoagulation currently   Dyspnea on exertion 10/12/2017   Overview:  2018   Edema -lower extremity 06/22/2013   Fatigue 08/19/2012   GERD (gastroesophageal reflux disease)    Hammer toe of left foot 06/07/2017   Headache    Heart block    Hemiparesis of left nondominant side as late effect of  cerebral infarction ([name] [name]) 12/22/2016   High cholesterol    History of pulmonary embolism 08/30/2012   PE 2013  >>repeat CT Angio chest 12/2013>>>NO PE    Hypertension    IBS (irritable bowel syndrome)    Left foot pain 06/07/2017   Left leg pain 08/03/2016   Overview:  2017: related to weakness left leg from stroke   Lumbar radiculopathy 07/27/2017   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 probably not 1975   a. 1975 Reported MI;  b. 2011 Cath: nonobs dzs;  c. 2013 Nonischemic Myoview;  d. 12/2012 Echo: EF 60-65%, Gr1 DD.   Nephrolithiasis    Orthostatic syncope 01/01/2018   Osteoarthritis of right knee 12/12/2012   Osteoporosis 08/01/2016   Pain in limb 08/16/2013   Pain of left femur 06/17/2017   Overview:  2018   Palpitation    a. 2010 Event Monitor: PACs and PVCs   Panlobular emphysema ([name] [name]) 01/15/2016   Paroxysmal supraventricular tachycardia ([place]) 01/15/2016   Pneumonia    PONV (postoperative nausea and vomiting)    Post-dural puncture headache 09/01/2017   Presence of cardiac pacemaker 01/16/2016   July 5,2016 Overview:  2016: complete AVB   Presence of permanent cardiac pacemaker    Pulmonary fibrosis ([place])    S/P left knee arthroscopy 04/01/2017   S/P TKR (total knee replacement) using cement, right 12/07/2016   Stroke ([name] [name])    Varicose veins of lower extremities with other complications 16/11/958   Vitamin D deficiency 08/01/2016   Weakness generalized 11/25/2017   Wears dentures    full   Wide-complex tachycardia    a. Followed by Dr. Caryl Comes, "I think this is VT, but i am not sure QRSd 135 1:1 AV."    Past Surgical History:  Procedure Laterality Date   APPENDECTOMY     BLADDER SUSPENSION     tack   CARDIAC CATHETERIZATION Right 05/14/2015   Procedure: Temporary Pacemaker;  Surgeon: Thompson Grayer, MD;  Location: [facility];  Service: Cardiovascular;  Laterality: Right;   CHOLECYSTECTOMY      COLON SURGERY     12inches colectomy s/p diverticulitis   COLONOSCOPY W/ BIOPSIES AND POLYPECTOMY     EP IMPLANTABLE DEVICE N/A 05/14/2015   Procedure: Pacemaker Implant;  Surgeon: Thompson Grayer, MD; [place] [facility] (serial number [id number])      IR FL GUIDED LOC OF NEEDLE/CATH TIP FOR SPINAL INJECTION LT  09/06/2017   KNEE ARTHROSCOPY Left 03/29/2017   Procedure: ARTHROSCOPY LEFT KNEE WITH PARTIAL MEDIAL AND PARTIAL LATERAL MENISCECTOMY;  Surgeon: Vickey Huger, MD;  Location: [facility];  Service: Orthopedics;  Laterality: Left;   MULTIPLE TOOTH EXTRACTIONS     TONSILLECTOMY     TOTAL ABDOMINAL HYSTERECTOMY     TOTAL KNEE ARTHROPLASTY  12/12/2012   Procedure: TOTAL KNEE ARTHROPLASTY;  Surgeon: Alta Corning, MD;  Location: [facility];  Service: Orthopedics;  Laterality: Right;  right total knee arthroplasty    Current Medications:  Current Meds  Medication Sig   acetaminophen (TYLENOL) 500 MG tablet Take 1,000 mg by mouth at bedtime as needed for moderate pain (pain).   amLODipine (NORVASC) 5 MG tablet Take 1 tablet by mouth daily.   cholecalciferol (VITAMIN D) 1000 units tablet Take 1,000 Units by mouth daily.   furosemide (LASIX) 20 MG tablet Take one tablet by mouth every other day alternating with 0.5 tablet by mouth every other day.   lamoTRIgine (LAMICTAL) 25 MG tablet Take 25 mg by mouth in the morning and at bedtime.   meclizine (ANTIVERT) 12.5 MG tablet Take 12.5 mg by mouth daily as needed for dizziness.   metoprolol tartrate (LOPRESSOR) 25 MG tablet Take 0.5 tablets (12.5 mg total) by mouth 2 (two) times daily.   nortriptyline (PAMELOR) 25 MG capsule Take 50 mg by mouth at bedtime.   pravastatin (PRAVACHOL) 20 MG tablet Take 1 tablet (20 mg total) by mouth daily at 6 PM.   tiZANidine (ZANAFLEX) 2 MG tablet Take 1-2 tablets by mouth every 8 (eight) hours as needed for muscle spasms.   vitamin B-12 (CYANOCOBALAMIN) 1000 MCG tablet Take 1,000 mcg by mouth daily.      Allergies:   Crestor [rosuvastatin], Darvon, Meloxicam, Meperidine, Prednisone, Valsartan, Amiodarone, Aspirin-acetaminophen-caffeine, Nitroglycerin, and Tramadol   Social History   Socioeconomic History   Marital status: Widowed    Spouse name: Not on file   Number of children: 3   Years of education: Not on file   Highest education level: Not on file  Occupational History   Occupation: Retired    Comment: Musician  Tobacco Use   Smoking status: Never   Smokeless tobacco: Never  Vaping Use   Vaping Use: Never used  Substance and Sexual Activity   Alcohol use: No   Drug use: No   Sexual activity: Not on file  Other Topics Concern   Not on file  Social History Narrative   Not on file   Social Determinants of Health   Financial Resource Strain: Not on file  Food Insecurity: Not on file  Transportation Needs: Not on file  Physical Activity: Not on file  Stress: Not on file  Social Connections: Not on file     Family History: The patient's family history includes Heart attack in her mother; Heart disease in her father; Other in her son; Stomach cancer in her brother. ROS:   Please see the history of present illness.    All other systems reviewed and are negative.  EKGs/Labs/Other Studies Reviewed:    The following studies were reviewed today:  EKG:  EKG 05/16/2021 shows her to be in a dual-chamber paced rhythm atrial sensed ventricular paced  Recent Labs: 06/06/2021: BUN 20; Creatinine, Ser 1.31; NT-Pro BNP 309; Potassium 4.3; Sodium 141  Recent Lipid Panel    Component Value Date/Time   CHOL 163 09/04/2018 0509   TRIG 77 09/04/2018 0509   HDL 42 09/04/2018 0509   CHOLHDL 3.9 09/04/2018 0509   VLDL 15 09/04/2018 0509   LDLCALC 106 (H) 09/04/2018 0509    Physical Exam:    VS:  BP 130/70 (BP Location: Right Arm, Patient Position: Sitting, Cuff Size: Normal)   Pulse 72   Ht _0  (1.702 m)   Wt 132 lb (59.9 kg)   SpO2 94%   BMI 20.67 kg/m      Wt Readings from Last 3 Encounters:  09/09/21 132 lb (59.9 kg)  06/11/21 131 lb 9.6 oz (59.7 kg)  06/06/21  132 lb 3.2 oz (60 kg)     GEN: She looks frail well nourished, well developed in no acute distress HEENT: Normal NECK: No JVD; No carotid bruits LYMPHATICS: No lymphadenopathy CARDIAC: RRR, no murmurs, rubs, gallops RESPIRATORY:  Clear to auscultation without rales, wheezing or rhonchi  ABDOMEN: Soft, non-tender, non-distended MUSCULOSKELETAL:  No edema; No deformity  SKIN: Warm and dry NEUROLOGIC:  Alert and oriented x 3 PSYCHIATRIC:  Normal affect    Signed, Shirlee More, MD  09/09/2021 2:18 PM    [facility]

## 2021-09-09 NOTE — Patient Instructions (Signed)
Medication Instructions:  Your physician has recommended you make the following change in your medication:  START: Aspirin 81 mg take one tablet by mouth daily.  *If you need a refill on your cardiac medications before your next appointment, please call your pharmacy*   Lab Work: Your physician recommends that you return for lab work in: TODAY CMP, Lipids If you have labs (blood work) drawn today and your tests are completely normal, you will receive your results only by: MyChart Message (if you have MyChart) OR A paper copy in the mail If you have any lab test that is abnormal or we need to change your treatment, we will call you to review the results.   Testing/Procedures: None   Follow-Up: At [facility], you and your health needs are our priority.  As part of our continuing mission to provide you with exceptional heart care, we have created designated Provider Care Teams.  These Care Teams include your primary Cardiologist (physician) and Advanced Practice Providers (APPs -  Physician Assistants and Nurse Practitioners) who all work together to provide you with the care you need, when you need it.  We recommend signing up for the patient portal called "MyChart".  Sign up information is provided on this After Visit Summary.  MyChart is used to connect with patients for Virtual Visits (Telemedicine).  Patients are able to view lab/test results, encounter notes, upcoming appointments, etc.  Non-urgent messages can be sent to your provider as well.   To learn more about what you can do with MyChart, go to [email or web address].    Your next appointment:   6 month(s)  The format for your next appointment:   In Person  Provider:   Norman Herrlich, MD   Other Instructions

## 2021-09-10 ENCOUNTER — Telehealth: Payer: Self-pay

## 2021-09-10 LAB — LIPID PANEL
Chol/HDL Ratio: 2.9 ratio (ref 0.0–4.4)
Cholesterol, Total: 164 mg/dL (ref 100–199)
HDL: 57 mg/dL (ref >39)
LDL Chol Calc (NIH): 89 mg/dL (ref 0–99)
Triglycerides: 98 mg/dL (ref 0–149)
VLDL Cholesterol Cal: 18 mg/dL (ref 5–40)

## 2021-09-10 LAB — COMPREHENSIVE METABOLIC PANEL
ALT: 13 IU/L (ref 0–32)
AST: 26 IU/L (ref 0–40)
Albumin/Globulin Ratio: 1.7 (ref 1.2–2.2)
Albumin: 4.5 g/dL (ref 3.5–4.6)
Alkaline Phosphatase: 71 IU/L (ref 44–121)
BUN/Creatinine Ratio: 18 (ref 12–28)
BUN: 27 mg/dL (ref 10–36)
Bilirubin Total: 0.3 mg/dL (ref 0.0–1.2)
CO2: 25 mmol/L (ref 20–29)
Calcium: 9.8 mg/dL (ref 8.7–10.3)
Chloride: 101 mmol/L (ref 96–106)
Creatinine, Ser: 1.53 mg/dL — ABNORMAL HIGH (ref 0.57–1.00)
Globulin, Total: 2.7 g/dL (ref 1.5–4.5)
Glucose: 99 mg/dL (ref 70–99)
Potassium: 4.2 mmol/L (ref 3.5–5.2)
Sodium: 141 mmol/L (ref 134–144)
Total Protein: 7.2 g/dL (ref 6.0–8.5)
eGFR: 31 mL/min/{1.73_m2} — ABNORMAL LOW (ref >59)

## 2021-09-10 NOTE — Telephone Encounter (Signed)
Spoke with patient regarding results and recommendation.  Patient verbalizes understanding and is agreeable to plan of care. Advised patient to call back with any issues or concerns.  

## 2021-09-10 NOTE — Telephone Encounter (Signed)
-----   Message from Baldo Daub, MD sent at 09/10/2021 12:02 PM EDT ----- Normal or stable result no changes

## 2021-09-15 ENCOUNTER — Ambulatory Visit (INDEPENDENT_AMBULATORY_CARE_PROVIDER_SITE_OTHER): Payer: Medicare HMO

## 2021-09-15 DIAGNOSIS — I63311 Cerebral infarction due to thrombosis of right middle cerebral artery: Secondary | ICD-10-CM

## 2021-09-16 LAB — CUP PACEART REMOTE DEVICE CHECK
Battery Impedance: 642 Ohm
Battery Remaining Longevity: 76 mo
Battery Voltage: 2.78 V
Brady Statistic AP VP Percent: 14 %
Brady Statistic AP VS Percent: 0 %
Brady Statistic AS VP Percent: 86 %
Brady Statistic AS VS Percent: 0 %
Date Time Interrogation Session: 20221108144257
Implantable Lead Implant Date: 20160705
Implantable Lead Implant Date: 20160705
Implantable Lead Location: 753859
Implantable Lead Location: 753860
Implantable Lead Model: 5076
Implantable Lead Model: 5076
Implantable Pulse Generator Implant Date: 20160705
Lead Channel Impedance Value: 486 Ohm
Lead Channel Impedance Value: 622 Ohm
Lead Channel Pacing Threshold Amplitude: 0.5 V
Lead Channel Pacing Threshold Amplitude: 0.5 V
Lead Channel Pacing Threshold Pulse Width: 0.4 ms
Lead Channel Pacing Threshold Pulse Width: 0.4 ms
Lead Channel Setting Pacing Amplitude: 2 V
Lead Channel Setting Pacing Amplitude: 2.5 V
Lead Channel Setting Pacing Pulse Width: 0.4 ms
Lead Channel Setting Sensing Sensitivity: 4 mV

## 2021-09-18 NOTE — Progress Notes (Signed)
Remote pacemaker transmission.   

## 2021-12-15 ENCOUNTER — Ambulatory Visit (INDEPENDENT_AMBULATORY_CARE_PROVIDER_SITE_OTHER): Payer: Medicare HMO

## 2021-12-15 DIAGNOSIS — I459 Conduction disorder, unspecified: Secondary | ICD-10-CM

## 2021-12-16 LAB — CUP PACEART REMOTE DEVICE CHECK
Battery Impedance: 642 Ohm
Battery Remaining Longevity: 74 mo
Battery Voltage: 2.78 V
Brady Statistic AP VP Percent: 19 %
Brady Statistic AP VS Percent: 0 %
Brady Statistic AS VP Percent: 81 %
Brady Statistic AS VS Percent: 0 %
Date Time Interrogation Session: 20230206181526
Implantable Lead Implant Date: 20160705
Implantable Lead Implant Date: 20160705
Implantable Lead Location: 753859
Implantable Lead Location: 753860
Implantable Lead Model: 5076
Implantable Lead Model: 5076
Implantable Pulse Generator Implant Date: 20160705
Lead Channel Impedance Value: 453 Ohm
Lead Channel Impedance Value: 565 Ohm
Lead Channel Pacing Threshold Amplitude: 0.375 V
Lead Channel Pacing Threshold Amplitude: 0.5 V
Lead Channel Pacing Threshold Pulse Width: 0.4 ms
Lead Channel Pacing Threshold Pulse Width: 0.4 ms
Lead Channel Setting Pacing Amplitude: 2 V
Lead Channel Setting Pacing Amplitude: 2.5 V
Lead Channel Setting Pacing Pulse Width: 0.4 ms
Lead Channel Setting Sensing Sensitivity: 4 mV

## 2021-12-17 NOTE — Progress Notes (Signed)
Remote pacemaker transmission.   

## 2022-02-07 DEATH — deceased

## 2022-03-24 ENCOUNTER — Ambulatory Visit: Payer: Medicare HMO | Admitting: Cardiology
